# Patient Record
Sex: Male | Born: 1965 | Race: White | Hispanic: No | Marital: Married | Smoking: Former smoker
Health system: Southern US, Community
[De-identification: ages and names within clinical notes are randomized; demographics above are authoritative.]

## PROBLEM LIST (undated history)

## (undated) DIAGNOSIS — F419 Anxiety disorder, unspecified: Secondary | ICD-10-CM

## (undated) DIAGNOSIS — M519 Unspecified thoracic, thoracolumbar and lumbosacral intervertebral disc disorder: Secondary | ICD-10-CM

## (undated) DIAGNOSIS — R200 Anesthesia of skin: Secondary | ICD-10-CM

## (undated) DIAGNOSIS — T8859XA Other complications of anesthesia, initial encounter: Secondary | ICD-10-CM

## (undated) DIAGNOSIS — T783XXA Angioneurotic edema, initial encounter: Secondary | ICD-10-CM

## (undated) DIAGNOSIS — N201 Calculus of ureter: Secondary | ICD-10-CM

## (undated) DIAGNOSIS — J189 Pneumonia, unspecified organism: Secondary | ICD-10-CM

## (undated) DIAGNOSIS — F410 Panic disorder [episodic paroxysmal anxiety] without agoraphobia: Secondary | ICD-10-CM

## (undated) DIAGNOSIS — M545 Low back pain, unspecified: Secondary | ICD-10-CM

## (undated) DIAGNOSIS — R202 Paresthesia of skin: Secondary | ICD-10-CM

## (undated) DIAGNOSIS — I2510 Atherosclerotic heart disease of native coronary artery without angina pectoris: Secondary | ICD-10-CM

## (undated) DIAGNOSIS — G8929 Other chronic pain: Secondary | ICD-10-CM

## (undated) DIAGNOSIS — E785 Hyperlipidemia, unspecified: Secondary | ICD-10-CM

## (undated) DIAGNOSIS — F1190 Opioid use, unspecified, uncomplicated: Secondary | ICD-10-CM

## (undated) DIAGNOSIS — I209 Angina pectoris, unspecified: Secondary | ICD-10-CM

## (undated) DIAGNOSIS — N200 Calculus of kidney: Secondary | ICD-10-CM

## (undated) DIAGNOSIS — N400 Enlarged prostate without lower urinary tract symptoms: Secondary | ICD-10-CM

## (undated) DIAGNOSIS — I447 Left bundle-branch block, unspecified: Secondary | ICD-10-CM

## (undated) DIAGNOSIS — M1990 Unspecified osteoarthritis, unspecified site: Secondary | ICD-10-CM

## (undated) DIAGNOSIS — G4733 Obstructive sleep apnea (adult) (pediatric): Secondary | ICD-10-CM

## (undated) DIAGNOSIS — E039 Hypothyroidism, unspecified: Secondary | ICD-10-CM

## (undated) DIAGNOSIS — Z9989 Dependence on other enabling machines and devices: Secondary | ICD-10-CM

## (undated) DIAGNOSIS — I10 Essential (primary) hypertension: Secondary | ICD-10-CM

## (undated) DIAGNOSIS — K5790 Diverticulosis of intestine, part unspecified, without perforation or abscess without bleeding: Secondary | ICD-10-CM

## (undated) DIAGNOSIS — T4145XA Adverse effect of unspecified anesthetic, initial encounter: Secondary | ICD-10-CM

## (undated) HISTORY — DX: Calculus of kidney: N20.0

## (undated) HISTORY — DX: Hyperlipidemia, unspecified: E78.5

## (undated) HISTORY — DX: Angioneurotic edema, initial encounter: T78.3XXA

## (undated) HISTORY — DX: Calculus of ureter: N20.1

## (undated) HISTORY — PX: FRACTURE SURGERY: SHX138

## (undated) HISTORY — DX: Atherosclerotic heart disease of native coronary artery without angina pectoris: I25.10

## (undated) HISTORY — PX: CARDIAC CATHETERIZATION: SHX172

## (undated) HISTORY — DX: Essential (primary) hypertension: I10

## (undated) HISTORY — PX: CORONARY ANGIOPLASTY: SHX604

## (undated) HISTORY — DX: Unspecified thoracic, thoracolumbar and lumbosacral intervertebral disc disorder: M51.9

## (undated) HISTORY — DX: Diverticulosis of intestine, part unspecified, without perforation or abscess without bleeding: K57.90

## (undated) HISTORY — PX: KNEE ARTHROSCOPY: SHX127

## (undated) HISTORY — PX: TONSILLECTOMY: SUR1361

## (undated) HISTORY — PX: LITHOTRIPSY: SUR834

---
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
## 1989-02-18 HISTORY — PX: KNEE ARTHROSCOPY: SHX127

## 1999-07-08 ENCOUNTER — Encounter: Payer: Self-pay | Admitting: Urology

## 1999-07-08 ENCOUNTER — Observation Stay (HOSPITAL_COMMUNITY): Admission: RE | Admit: 1999-07-08 | Discharge: 1999-07-08 | Payer: Self-pay | Admitting: Urology

## 2000-02-01 ENCOUNTER — Encounter: Payer: Self-pay | Admitting: Urology

## 2000-02-01 ENCOUNTER — Encounter: Admission: RE | Admit: 2000-02-01 | Discharge: 2000-02-01 | Payer: Self-pay | Admitting: Urology

## 2002-10-09 ENCOUNTER — Emergency Department (HOSPITAL_COMMUNITY): Admission: EM | Admit: 2002-10-09 | Discharge: 2002-10-09 | Payer: Self-pay | Admitting: Emergency Medicine

## 2002-10-09 ENCOUNTER — Encounter: Payer: Self-pay | Admitting: Emergency Medicine

## 2002-12-04 ENCOUNTER — Encounter: Payer: Self-pay | Admitting: Emergency Medicine

## 2002-12-05 ENCOUNTER — Observation Stay (HOSPITAL_COMMUNITY): Admission: EM | Admit: 2002-12-05 | Discharge: 2002-12-05 | Payer: Self-pay | Admitting: Emergency Medicine

## 2005-02-15 ENCOUNTER — Ambulatory Visit (HOSPITAL_BASED_OUTPATIENT_CLINIC_OR_DEPARTMENT_OTHER): Admission: RE | Admit: 2005-02-15 | Discharge: 2005-02-15 | Payer: Self-pay | Admitting: Urology

## 2005-02-15 ENCOUNTER — Ambulatory Visit (HOSPITAL_COMMUNITY): Admission: RE | Admit: 2005-02-15 | Discharge: 2005-02-15 | Payer: Self-pay | Admitting: Urology

## 2005-02-17 ENCOUNTER — Ambulatory Visit (HOSPITAL_COMMUNITY): Admission: RE | Admit: 2005-02-17 | Discharge: 2005-02-17 | Payer: Self-pay | Admitting: Urology

## 2006-11-12 ENCOUNTER — Emergency Department (HOSPITAL_COMMUNITY): Admission: EM | Admit: 2006-11-12 | Discharge: 2006-11-12 | Payer: Self-pay | Admitting: Emergency Medicine

## 2007-05-05 ENCOUNTER — Encounter: Payer: Self-pay | Admitting: Cardiology

## 2008-10-18 HISTORY — PX: SYNDESMOSIS REPAIR: SHX5182

## 2008-10-31 ENCOUNTER — Emergency Department (HOSPITAL_COMMUNITY): Admission: EM | Admit: 2008-10-31 | Discharge: 2008-10-31 | Payer: Self-pay | Admitting: Emergency Medicine

## 2009-04-13 ENCOUNTER — Ambulatory Visit (HOSPITAL_COMMUNITY): Admission: RE | Admit: 2009-04-13 | Discharge: 2009-04-13 | Payer: Self-pay | Admitting: Urology

## 2009-08-14 ENCOUNTER — Ambulatory Visit: Payer: Self-pay | Admitting: Cardiovascular Disease

## 2009-08-14 ENCOUNTER — Inpatient Hospital Stay (HOSPITAL_COMMUNITY): Admission: EM | Admit: 2009-08-14 | Discharge: 2009-08-15 | Payer: Self-pay | Admitting: Emergency Medicine

## 2009-08-14 ENCOUNTER — Encounter: Payer: Self-pay | Admitting: Cardiovascular Disease

## 2009-08-14 ENCOUNTER — Encounter: Payer: Self-pay | Admitting: Cardiology

## 2009-08-15 ENCOUNTER — Encounter: Payer: Self-pay | Admitting: Cardiology

## 2009-08-15 ENCOUNTER — Encounter: Payer: Self-pay | Admitting: Cardiovascular Disease

## 2009-08-18 ENCOUNTER — Encounter (INDEPENDENT_AMBULATORY_CARE_PROVIDER_SITE_OTHER): Payer: Self-pay | Admitting: *Deleted

## 2009-09-07 ENCOUNTER — Encounter: Payer: Self-pay | Admitting: Cardiology

## 2009-09-08 ENCOUNTER — Ambulatory Visit: Payer: Self-pay | Admitting: Cardiology

## 2009-09-08 DIAGNOSIS — E785 Hyperlipidemia, unspecified: Secondary | ICD-10-CM | POA: Insufficient documentation

## 2009-09-08 DIAGNOSIS — E782 Mixed hyperlipidemia: Secondary | ICD-10-CM

## 2009-09-08 DIAGNOSIS — I25119 Atherosclerotic heart disease of native coronary artery with unspecified angina pectoris: Secondary | ICD-10-CM | POA: Insufficient documentation

## 2009-09-08 DIAGNOSIS — I2510 Atherosclerotic heart disease of native coronary artery without angina pectoris: Secondary | ICD-10-CM

## 2009-09-29 ENCOUNTER — Encounter: Payer: Self-pay | Admitting: Cardiology

## 2009-10-26 ENCOUNTER — Encounter (INDEPENDENT_AMBULATORY_CARE_PROVIDER_SITE_OTHER): Payer: Self-pay | Admitting: *Deleted

## 2009-10-30 ENCOUNTER — Encounter: Payer: Self-pay | Admitting: Cardiology

## 2009-11-03 ENCOUNTER — Encounter (INDEPENDENT_AMBULATORY_CARE_PROVIDER_SITE_OTHER): Payer: Self-pay | Admitting: *Deleted

## 2009-12-03 ENCOUNTER — Ambulatory Visit: Payer: Self-pay | Admitting: Cardiology

## 2010-03-30 ENCOUNTER — Encounter: Payer: Self-pay | Admitting: Cardiology

## 2010-04-06 ENCOUNTER — Encounter: Payer: Self-pay | Admitting: Cardiology

## 2010-05-05 ENCOUNTER — Encounter: Payer: Self-pay | Admitting: Cardiology

## 2010-05-31 ENCOUNTER — Encounter: Payer: Self-pay | Admitting: Cardiology

## 2010-06-01 ENCOUNTER — Ambulatory Visit: Payer: Self-pay | Admitting: Cardiology

## 2010-06-01 ENCOUNTER — Encounter: Payer: Self-pay | Admitting: Cardiology

## 2010-07-13 ENCOUNTER — Telehealth: Payer: Self-pay | Admitting: Cardiology

## 2010-07-22 ENCOUNTER — Telehealth (INDEPENDENT_AMBULATORY_CARE_PROVIDER_SITE_OTHER): Payer: Self-pay | Admitting: *Deleted

## 2010-07-22 NOTE — Letter (Signed)
Summary: Discharge Summary/ Joseph [name]  Discharge Summary/ Joseph [name]   Imported By: Dorise Hiss 09/09/2009 08:38:25  _____________________________________________________________________  External Attachment:    Type:   Image     Comment:   External Document

## 2010-07-22 NOTE — Letter (Signed)
Summary: Engineer, materials at [facility]  [address]   [place], [place] [address]   Phone: [phone]  Fax: [phone]        Nov 03, 2009 MRN: [id number]    [facility] [address] [place], [place]  [address]    Dear Mr. Huizar,  Your test ordered by Selena Batten has been reviewed by your physician (or physician assistant) and was found to be normal or stable. Your physician (or physician assistant) felt no changes were needed at this time.  ____ Echocardiogram  ____ Cardiac Stress Test  __X__ Lab Work-Cholesterol numbers are looking better and liver function is okay. Continue same medications.  ____ Peripheral vascular study of arms, legs or neck  ____ CT scan or X-ray  ____ Lung or Breathing test  ____ Other:   Thank you.   Cyril Loosen, RN, BSN    Duane Boston, M.D., F.A.C.C. Thressa Sheller, M.D., F.A.C.C. Oneal Grout, M.D., F.A.C.C. Cheree Ditto, M.D., F.A.C.C. Daiva Nakayama, M.D., F.A.C.C. Kenney Houseman, M.D., F.A.C.C. Jeanne Ivan, PA-C

## 2010-07-22 NOTE — Miscellaneous (Signed)
Summary: Orders Update  Clinical Lists Changes  Orders: Added new Test order of T-Lipid Profile ([id number]) - Signed Added new Test order of T-Hepatic Function ([id number]) - Signed 

## 2010-07-22 NOTE — Letter (Signed)
Summary: External Correspondence/ [facility] OFFICE NOTE  External Correspondence/ [facility] OFFICE NOTE   Imported By: Dorise Hiss 04/12/2010 13:55:33  _____________________________________________________________________  External Attachment:    Type:   Image     Comment:   External Document

## 2010-07-22 NOTE — Letter (Signed)
Summary: Generic Engineer, agricultural at [facility] [address]   [place], [place] [address]   Phone: [phone]  Fax: [phone]        Oct 26, 2009 MRN: [id number]    [facility] [address] [place], [place]  [address]    Dear Mr. Badal,   This is a reminder that you are due for lab work around May 16th to check your cholesterol and liver function labs. Please, take the enclosed order to the [facility] to have this lab work drawn. Do not eat or drink after midnight.      Sincerely,  Cyril Loosen, RN, BSN  This letter has been electronically signed by your physician.

## 2010-07-22 NOTE — Letter (Signed)
Summary: External Correspondence/ OFFICE NOTE [facility]  External Correspondence/ OFFICE NOTE [facility]   Imported By: Dorise Hiss 10/12/2009 10:13:07  _____________________________________________________________________  External Attachment:    Type:   Image     Comment:   External Document

## 2010-07-22 NOTE — Assessment & Plan Note (Signed)
Summary: 6 MO FU DEC REMINDER   Visit Type:  Follow-up Primary Ipek Westra:  Dr. Donzetta Sprung   History of Present Illness: 45 year old male presents for followup. He was seen back in June. He reports doing very well, no angina or breathlessness. Exercises, including racquetball, without symptoms. He reports compliance with his medications.  Followup labs from 12 December showed AST 24, ALT 29, cholesterol 163, triglycerides 135, HDL 35, LDL 101. We discussed these today. He has been on Zocor 10 mg daily.  He is due for refills, and will need 90 day supplies for mail order as of January. Also needs refill for nitroglycerin, has not required any.  Clinical Review Panels:  Cardiac Imaging Cardiac Cath Findings  FINDINGS:   1. Hemodynamics:  Aorta 125/89, LV 143/17.   2. Left ventriculography:  EF was estimated to be 60-65%.  There were       no regional wall motion abnormalities in the RAO projection.   3. Right coronary artery:  The right coronary artery had diffuse       approximately 30% stenosis throughout the proximal portion of the       vessel.  It was a dominant vessel.   4. Left main:  The left main had mild luminal regularities.   5. Left circumflex system:  The left circumflex itself was a large       vessel.  There were first and second obtuse marginals and a PLOM       with luminal irregularities only.   6. LAD system:  There was a 50% proximal LAD stenosis before the first       diagonal.  There was then a small first diagonal and a moderate-       sized second diagonal.  Following the second diagonal, there was a       90% stenosis around the junction of the proximal and mid LAD.  The       mid LAD had luminal irregularities.  (08/14/2009)    Preventive Screening-Counseling & Management  Alcohol-Tobacco     Smoking Status: quit     Year Quit: 04/2003  Current Medications (verified): 1)  Synthroid 88 Mcg Tabs (Levothyroxine Sodium) .... Take 1 Tablet By Mouth Once  A Day 2)  Plavix 75 Mg Tabs (Clopidogrel Bisulfate) .... Take 1 Tablet By Mouth Once A Day 3)  Aspir-Trin 325 Mg Tbec (Aspirin) .... Take 1 Tablet By Mouth Once A Day 4)  Toprol Xl 25 Mg Xr24h-Tab (Metoprolol Succinate) .... Take 1 Tablet By Mouth Once A Day 5)  Vicodin 5-500 Mg Tabs (Hydrocodone-Acetaminophen) .... Take 1 Tablet By Mouth Once A Day As Needed 6)  Zocor 20 Mg Tabs (Simvastatin) .... Take 1 Tab By Mouth At Bedtime 7)  Nitrostat 0.4 Mg Subl (Nitroglycerin) .... Dissolve Under Tonguefor Chest Pain Every 5 Minutes Not To Exceed 3 Doses.  Allergies (verified): 1)  ! Pcn 2)  ! * Tetanus  Comments:  Nurse/Medical Assistant: The patient is currently on medications but does not know the name or dosage at this time. Instructed to contact our office with details. Will update medication list at that time.  Past History:  Past Medical History: Last updated: 09/05/2009 CAD - DES LAD 2/11, LVEF 65% Hyperlipidemia Hypothyroidism OSA Diverticulosis Lumbar disc disease Right ureteral stone  Social History: Last updated: 09/05/2009 Full Time Married  Tobacco Use - No Alcohol Use - no  Review of Systems  The patient denies anorexia, fever, weight loss, chest pain,  syncope, dyspnea on exertion, peripheral edema, melena, and hematochezia.         Otherwise reviewed and negative.  Vital Signs:  Patient profile:   45 year old male Height:      72 inches Weight:      260 pounds Pulse rate:   74 / minute BP sitting:   130 / 83  (left arm) Cuff size:   large  Vitals Entered By: Carlye Grippe (June 01, 2010 3:59 PM)  Physical Exam  Additional Exam:  Overweight male in no acute distress. HEENT: Conjunctiva and lids normal, oropharynx clear. Neck: Supple, no elevated chart her venous pressure or bruits. Lungs: Clear auscultation, nonlabored. Cardiac: Regular rate and rhythm, no S3. Abdomen: Soft, nontender, no bruits. Extremities: No pitting edema, right radial  cath site stable. Normal radial pulses. Skin: Warm and dry. Musculoskeletal: No kyphosis. Neuropsychiatric: Alert and oriented x3, affect appropriate.   EKG  Procedure date:  06/01/2010  Findings:      Sinus rhythm at 72 beats per minute.  Impression & Recommendations:  Problem # 1:  CORONARY ATHEROSCLEROSIS NATIVE CORONARY ARTERY (ICD-414.01)  Symptomatically stable on medical therapy. Continue dual antiplatelet therapy, at least through February 2012. Plan followup in 6 months.  His updated medication list for this problem includes:    Plavix 75 Mg Tabs (Clopidogrel bisulfate) .Marland Kitchen... Take 1 tablet by mouth once a day    Aspir-trin 325 Mg Tbec (Aspirin) .Marland Kitchen... Take 1 tablet by mouth once a day    Toprol Xl 25 Mg Xr24h-tab (Metoprolol succinate) .Marland Kitchen... Take 1 tablet by mouth once a day    Nitrostat 0.4 Mg Subl (Nitroglycerin) .Marland Kitchen... Dissolve under tonguefor chest pain every 5 minutes not to exceed 3 doses.  Orders: EKG w/ Interpretation (93000)  Problem # 2:  MIXED HYPERLIPIDEMIA (ICD-272.2)  Plan to increase Zocor to 20 mg p.o. q.h.s. Followup fasting lipid profile and liver function tests for his next visit.  His updated medication list for this problem includes:    Zocor 20 Mg Tabs (Simvastatin) .Marland Kitchen... Take 1 tab by mouth at bedtime  Patient Instructions: 1)  Increase Zocor to 20mg  at bedtime - may take two of the 10mg  till get mail order.  PATIENT WILL CALL BACK WITH NAME OF MAIL ORDER AFTER HE CHECKS WITH HIS INSURANCE FOR NEW 90 DAY RX. 2)  Labs:  FLP/LFT prior to next visit in 6 months 3)  Follow up in  6 months Prescriptions: NITROSTAT 0.4 MG SUBL (NITROGLYCERIN) dissolve under tonguefor chest pain every 5 minutes not to exceed 3 doses.  #25 x 2   Entered by:   Hoover Brunette, LPN   Authorized by:   Loreli Slot, MD, [facility]   Signed by:   Hoover Brunette, LPN on 16/03/9603   Method used:   Electronically to        [facility] [address] (retail)        [address]       [place], [place]  [address]       Ph: [phone] or [phone]       Fax: [phone]   RxID:   [id number]

## 2010-07-22 NOTE — Assessment & Plan Note (Signed)
Summary: 3 MO FU PER JUNE REMINDER-SRS   Visit Type:  Follow-up Primary Provider:  Dr. Donzetta Sprung   History of Present Illness: 45 year old male presents for followup. He reports doing very well without any significant angina. He is now exercising on a daily basis, including running on the treadmill and also racquetball. He reports better energy and stamina.  Labs from 13 May showed AST 27, ALT 36, cholesterol 142, HDL 36, LDL 92 (was 134), triglycerides 68. He is tolerating low-dose Zocor.  He has lost 8 pounds since his last visit with a combination of diet and exercise.  He does indicate some cramping in his calves, usually after he exercises or sometimes in the morning. Not clearly claudication, although we did discuss this.  Preventive Screening-Counseling & Management  Alcohol-Tobacco     Smoking Status: quit     Year Quit: 2004  Current Medications (verified): 1)  Synthroid 88 Mcg Tabs (Levothyroxine Sodium) .... Take 1 Tablet By Mouth Once A Day 2)  Plavix 75 Mg Tabs (Clopidogrel Bisulfate) .... Take 1 Tablet By Mouth Once A Day 3)  Aspir-Trin 325 Mg Tbec (Aspirin) .... Take 1 Tablet By Mouth Once A Day 4)  Toprol Xl 25 Mg Xr24h-Tab (Metoprolol Succinate) .... Take 1 Tablet By Mouth Once A Day 5)  Vicodin 5-500 Mg Tabs (Hydrocodone-Acetaminophen) .... Take 1 Tablet By Mouth Once A Day As Needed 6)  Zocor 10 Mg Tabs (Simvastatin) .... Take 1 Tablet By Mouth At Bedtime 7)  Nitrostat 0.4 Mg Subl (Nitroglycerin) .... Dissolve Under Tonguefor Chest Pain Every 5 Minutes Not To Exceed 3 Doses.  Allergies (verified): 1)  ! Pcn 2)  ! * Tetanus  Comments:  Nurse/Medical Assistant: The patient is currently on medications but does not know the name or dosage at this time. Instructed to contact our office with details. Will update medication list at that time.  Past History:  Past Medical History: Last updated: 09/05/2009 CAD - DES LAD 2/11, LVEF  65% Hyperlipidemia Hypothyroidism OSA Diverticulosis Lumbar disc disease Right ureteral stone  Social History: Last updated: 09/05/2009 Full Time Married  Tobacco Use - No Alcohol Use - no  Clinical Review Panels:  Cardiac Imaging Cardiac Cath Findings  FINDINGS:   1. Hemodynamics:  Aorta 125/89, LV 143/17.   2. Left ventriculography:  EF was estimated to be 60-65%.  There were       no regional wall motion abnormalities in the RAO projection.   3. Right coronary artery:  The right coronary artery had diffuse       approximately 30% stenosis throughout the proximal portion of the       vessel.  It was a dominant vessel.   4. Left main:  The left main had mild luminal regularities.   5. Left circumflex system:  The left circumflex itself was a large       vessel.  There were first and second obtuse marginals and a PLOM       with luminal irregularities only.   6. LAD system:  There was a 50% proximal LAD stenosis before the first       diagonal.  There was then a small first diagonal and a moderate-       sized second diagonal.  Following the second diagonal, there was a       90% stenosis around the junction of the proximal and mid LAD.  The       mid LAD had luminal irregularities.  (  08/14/2009)    Review of Systems  The patient denies anorexia, fever, chest pain, syncope, dyspnea on exertion, peripheral edema, abdominal pain, melena, and hematochezia.         Otherwise reviewed and negative except as outlined.  Vital Signs:  Patient profile:   45 year old male Height:      72 inches Weight:      255 pounds Pulse rate:   65 / minute BP sitting:   115 / 72  (left arm) Cuff size:   large  Vitals Entered By: Carlye Grippe (December 03, 2009 2:26 PM)  Physical Exam  Additional Exam:  Overweight male in no acute distress. HEENT: Conjunctiva and lids normal, oropharynx clear. Neck: Supple, no elevated chart her venous pressure or bruits. Lungs: Clear auscultation,  nonlabored. Cardiac: Regular rate and rhythm, no S3. Abdomen: Soft, nontender, no bruits. Extremities: No pitting edema, right radial cath site stable. Normal radial pulses. Skin: Warm and dry. Musculoskeletal: No kyphosis. Neuropsychiatric: Alert and oriented x3, affect appropriate.   Impression & Recommendations:  Problem # 1:  CORONARY ATHEROSCLEROSIS NATIVE CORONARY ARTERY (ICD-414.01)  Clinically stable without significant angina. Patient has made good progress in terms of diet and exercise. Plan to continue medical therapy, and I will see him back in 6 months.  His updated medication list for this problem includes:    Plavix 75 Mg Tabs (Clopidogrel bisulfate) .Marland Kitchen... Take 1 tablet by mouth once a day    Aspir-trin 325 Mg Tbec (Aspirin) .Marland Kitchen... Take 1 tablet by mouth once a day    Toprol Xl 25 Mg Xr24h-tab (Metoprolol succinate) .Marland Kitchen... Take 1 tablet by mouth once a day    Nitrostat 0.4 Mg Subl (Nitroglycerin) .Marland Kitchen... Dissolve under tonguefor chest pain every 5 minutes not to exceed 3 doses.  Problem # 2:  MIXED HYPERLIPIDEMIA (ICD-272.2)  LDL trending down on low-dose Zocor. Plan followup lipid profile and liver function tests in 6 months.  His updated medication list for this problem includes:    Zocor 10 Mg Tabs (Simvastatin) .Marland Kitchen... Take 1 tablet by mouth at bedtime  Patient Instructions: 1)  Your physician wants you to follow-up in: 6 months. You will receive a reminder letter in the mail one-two months in advance. If you don't receive a letter, please call our office to schedule the follow-up appointment. 2)  Your physician recommends that you go to the [facility] for a FASTING lipid profile and liver function labs: DO LABS IN 6 MONTHS BEFORE YOUR NEXT OFFICE VISIT.

## 2010-07-22 NOTE — Assessment & Plan Note (Signed)
Summary: eph-unstable angina cone   Visit Type:  Follow-up Primary Provider:  Dr. Donzetta Sprung   History of Present Illness: 45 year old male presents for a post-hospitalization followup visit. He recently presented to [facility] in late February with unstable angina, undergoing a cardiac catheterization that revealed significant obstructive left anterior descending disease, ultimately treated with drug-eluting stent placement by Dr. Clifton Vir. He was enrolled in the [facility] trial.  He is here with his wife for followup. He reports doing very well, focusing very intently on modifying his diet, with a good weight loss, and increase in his regular exercise regimen. He reports compliance with his medications. He is having no difficulty at his right radial catheter site. He is returned to work as before.  We reviewed his cholesterol numbers today, with LDL of 134, and discussed initiating low-dose simvastatin.  Preventive Screening-Counseling & Management  Alcohol-Tobacco     Smoking Status: quit     Year Quit: 2004  Current Medications (verified): 1)  Synthroid 88 Mcg Tabs (Levothyroxine Sodium) .... Take 1 Tablet By Mouth Once A Day 2)  Plavix 75 Mg Tabs (Clopidogrel Bisulfate) .... Take 1 Tablet By Mouth Once A Day 3)  Aspir-Trin 325 Mg Tbec (Aspirin) .... Take 1 Tablet By Mouth Once A Day 4)  Toprol Xl 25 Mg Xr24h-Tab (Metoprolol Succinate) .... Take 1 Tablet By Mouth Once A Day 5)  Vicodin 5-500 Mg Tabs (Hydrocodone-Acetaminophen) .... Take 1 Tablet By Mouth Once A Day As Needed 6)  Zocor 10 Mg Tabs (Simvastatin) .... Take 1 Tablet By Mouth At Bedtime  Comments:  Nurse/Medical Assistant: The patient's medications and allergies were reviewed with the patient and were updated in the Medication and Allergy Lists. Verbally gave names.  Past History:  Past Medical History: Last updated: 09/05/2009 CAD - DES LAD 2/11, LVEF  65% Hyperlipidemia Hypothyroidism OSA Diverticulosis Lumbar disc disease Right ureteral stone  Social History: Last updated: 09/05/2009 Full Time Married  Tobacco Use - No Alcohol Use - no  Clinical Review Panels:  Cardiac Imaging Cardiac Cath Findings  FINDINGS:   1. Hemodynamics:  Aorta 125/89, LV 143/17.   2. Left ventriculography:  EF was estimated to be 60-65%.  There were       no regional wall motion abnormalities in the RAO projection.   3. Right coronary artery:  The right coronary artery had diffuse       approximately 30% stenosis throughout the proximal portion of the       vessel.  It was a dominant vessel.   4. Left main:  The left main had mild luminal regularities.   5. Left circumflex system:  The left circumflex itself was a large       vessel.  There were first and second obtuse marginals and a PLOM       with luminal irregularities only.   6. LAD system:  There was a 50% proximal LAD stenosis before the first       diagonal.  There was then a small first diagonal and a moderate-       sized second diagonal.  Following the second diagonal, there was a       90% stenosis around the junction of the proximal and mid LAD.  The       mid LAD had luminal irregularities.  (08/14/2009)    Social History: Smoking Status:  quit  Review of Systems  The patient denies anorexia, fever, chest pain, syncope, dyspnea on exertion, peripheral edema,  melena, and hematochezia.         He has had some indigestion, otherwise reviewed and negative.  Vital Signs:  Patient profile:   45 year old male Height:      72 inches Weight:      263 pounds BMI:     35.80 Pulse rate:   72 / minute BP sitting:   111 / 74  (left arm) Cuff size:   large  Vitals Entered By: Carlye Grippe (September 08, 2009 10:39 AM)  Nutrition Counseling: Patient's BMI is greater than 25 and therefore counseled on weight management options.   Physical Exam  Additional Exam:  Overweight male in  no acute distress. HEENT: Conjunctiva and lids normal, oropharynx clear. Neck: Supple, no elevated chart her venous pressure or bruits. Lungs: Clear auscultation, nonlabored. Cardiac: Regular rate and rhythm, no S3. Abdomen: Soft, nontender, no bruits. Extremities: No pitting edema, right radial cath site stable. Normal radial pulses. Skin: Warm and dry. Musculoskeletal: No kyphosis. Neuropsychiatric: Alert and oriented x3, affect appropriate.   Impression & Recommendations:  Problem # 1:  CORONARY ATHEROSCLEROSIS NATIVE CORONARY ARTERY (ICD-414.01)  Patient doing well on medical therapy following recent intervention in the setting of unstable angina, with drug-eluting stent placement to left anterior descending as noted. He is exercising regularly, and has achieved weight loss through modifying his diet. I plan to see him back over the next 3 months.  His updated medication list for this problem includes:    Plavix 75 Mg Tabs (Clopidogrel bisulfate) .Marland Kitchen... Take 1 tablet by mouth once a day    Aspir-trin 325 Mg Tbec (Aspirin) .Marland Kitchen... Take 1 tablet by mouth once a day    Toprol Xl 25 Mg Xr24h-tab (Metoprolol succinate) .Marland Kitchen... Take 1 tablet by mouth once a day  Problem # 2:  MIXED HYPERLIPIDEMIA (ICD-272.2)  We will initiate simvastatin 10 mg p.o. q.h.s. and plan a followup fasting lipid profile and liver function tests in 8 weeks. Goal LDL would be closer to 70.  His updated medication list for this problem includes:    Zocor 10 Mg Tabs (Simvastatin) .Marland Kitchen... Take 1 tablet by mouth at bedtime  Future Orders: T-Lipid Profile ([id number]) ... 11/02/2009 T-Hepatic Function [id number]) ... 11/02/2009  Patient Instructions: 1)  Your physician wants you to follow-up in: 3 months. You will receive a reminder letter in the mail one-two months in advance. If you don't receive a letter, please call our office to schedule the follow-up appointment. 2)  Your physician recommends that you go to the  [facility] for a FASTING lipid profile and liver function labs: DO LABS IN 8 WEEKS (AROUND MAY 16TH). Do not eat or drink after midnight.  3)  Start Simvastatin (Zocor) 10mg  by mouth at bedtime. Prescriptions: ZOCOR 10 MG TABS (SIMVASTATIN) Take 1 tablet by mouth at bedtime  #30 x 6   Entered by:   Cyril Loosen, RN, BSN   Authorized by:   Loreli Slot, MD, [facility]   Signed by:   Cyril Loosen, RN, BSN on 09/08/2009   Method used:   Electronically to        [facility] [address] (retail)       [address]       [place], [place]  [address]       Ph: [phone] or [phone]       Fax: [phone]   RxID:   [id number]   Handout requested.

## 2010-07-22 NOTE — Miscellaneous (Signed)
Summary: FLP/LFT  Clinical Lists Changes  Orders: Added new Test order of T-Hepatic Function ([id number]) - Signed Added new Test order of T-Lipid Profile ([id number]) - Signed 

## 2010-07-22 NOTE — Letter (Signed)
Summary: Appointment- Rescheduled  [facility] [address]   [place], [place] [address]   Phone: [phone]  Fax: [phone]     Oct 30, 2009 MRN: [id number]     [facility] [address] [place], [place]  [address]     Dear Mr. Orlov,   Due to a change in our office schedule, your appointment on    June 16,2011 at  3:40pm must be changed to June 16, 2011at 2:40.     We look forward to participating in your health care needs.      Sincerely,  Glass blower/designer

## 2010-07-22 NOTE — Progress Notes (Signed)
Summary: Chest Pain  Phone Note Call from Patient Call back at [phone]   Summary of Call: Pt states he's had some chest pain. It is not hurting now but was hurting this am.  It states it feels like indigestion. Pt states he has not used any NTG. He states this started about 1 week ago. He states pain is usualy not related to a physical activity. He has been working out at gym without any problems. He states he has been eating really bad.   Pt will try NTG if pain returns. He is aware to go to ER for worsening or concerning symptoms. He is aware a note will be sent to Dr. Diona Browner for further review. Initial call taken by: Cyril Loosen, RN, BSN,  July 13, 2010 5:00 PM  Follow-up for Phone Call        Reassuring to hear that he is not having any exertional symptoms, particularly during workouts at the gym. As noted, this could be indigestion. Trial of antacid such as Pepcid or Zantac could be considered. If on the other hand, he notices responsiveness to nitroglycerin or the symptoms progress, we should see him and review this further. Follow-up by: Loreli Slot, MD, [facility],  July 14, 2010 8:14 AM     Appended Document: Chest Pain Pt notified and verbalized understanding.

## 2010-07-28 NOTE — Progress Notes (Signed)
Summary: CLARIFICATION ON TOPROL XL DOSE  Phone Note Call from Patient Call back at Home Phone [phone] Call back at [phone]   Caller: Spouse Call For: nurse Summary of Call: [facility] for toprol xl 25mg  1/2 daily. wife called to confirm dose saying patient has been on 1/2 since hospital d/c last February 2011. nurse informed wife that initial visit from hospital when meds were reviewed,patient verbally stated he was on the 25mg  and didn't specify to nurse he took 1/2. nurse informed wife that new dose would be sent to pharmacy and patient should bring bottles to each visit. Initial call taken by: Carlye Grippe,  July 22, 2010 4:53 PM    New/Updated Medications: TOPROL XL 25 MG XR24H-TAB (METOPROLOL SUCCINATE) Take 1/2 tablet by mouth once a day Prescriptions: TOPROL XL 25 MG XR24H-TAB (METOPROLOL SUCCINATE) Take 1/2 tablet by mouth once a day  #15 x 6   Entered by:   Carlye Grippe   Authorized by:   Loreli Slot, MD, [facility]   Signed by:   Carlye Grippe on 07/22/2010   Method used:   [facility]        [facility]  [address] [address] [id number]       [address]       [place], [place]  [address]       Ph: [phone] or [phone]       Fax: [phone]   RxID:   [id number]

## 2010-09-08 LAB — BASIC METABOLIC PANEL
CO2: 28 mEq/L (ref 19–32)
Calcium: 8.6 mg/dL (ref 8.4–10.5)
Creatinine, Ser: 0.82 mg/dL (ref 0.4–1.5)
GFR calc Af Amer: >60 mL/min (ref >60)
GFR calc non Af Amer: >60 mL/min (ref >60)

## 2010-09-08 LAB — CBC
HCT: 44.5 % (ref 39.0–52.0)
Hemoglobin: 15.3 g/dL (ref 13.0–17.0)
MCHC: 34 g/dL (ref 30.0–36.0)
MCV: 93.8 fL (ref 78.0–100.0)
MCV: 94.2 fL (ref 78.0–100.0)
Platelets: 205 10*3/uL (ref 150–400)
RBC: 4.41 MIL/uL (ref 4.22–5.81)
RBC: 4.75 MIL/uL (ref 4.22–5.81)
WBC: 5.7 10*3/uL (ref 4.0–10.5)

## 2010-09-08 LAB — COMPREHENSIVE METABOLIC PANEL
ALT: 65 U/L — ABNORMAL HIGH (ref 0–53)
Albumin: 4.3 g/dL (ref 3.5–5.2)
Calcium: 8.9 mg/dL (ref 8.4–10.5)
Chloride: 108 mEq/L (ref 96–112)
GFR calc Af Amer: >60 mL/min (ref >60)
GFR calc non Af Amer: >60 mL/min (ref >60)
Glucose, Bld: 90 mg/dL (ref 70–99)
Potassium: 3.9 mEq/L (ref 3.5–5.1)
Sodium: 140 mEq/L (ref 135–145)
Total Bilirubin: 0.9 mg/dL (ref 0.3–1.2)

## 2010-09-08 LAB — POCT CARDIAC MARKERS
CKMB, poc: 1.1 ng/mL (ref 1.0–8.0)
CKMB, poc: <1 ng/mL — ABNORMAL LOW (ref 1.0–8.0)
Myoglobin, poc: 66.8 ng/mL (ref 12–200)
Troponin i, poc: <0.05 ng/mL (ref 0.00–0.09)

## 2010-09-08 LAB — DIFFERENTIAL
Basophils Absolute: 0 10*3/uL (ref 0.0–0.1)
Eosinophils Absolute: 0 10*3/uL (ref 0.0–0.7)
Eosinophils Relative: 1 % (ref 0–5)
Lymphocytes Relative: 30 % (ref 12–46)
Lymphs Abs: 1.7 10*3/uL (ref 0.7–4.0)
Monocytes Relative: 8 % (ref 3–12)
Neutrophils Relative %: 60 % (ref 43–77)

## 2010-09-08 LAB — POCT I-STAT, CHEM 8
Calcium, Ion: 1.09 mmol/L — ABNORMAL LOW (ref 1.12–1.32)
TCO2: 27 mmol/L (ref 0–100)

## 2010-09-08 LAB — CK TOTAL AND CKMB (NOT AT ARMC): Relative Index: 1.3 (ref 0.0–2.5)

## 2010-09-08 LAB — APTT: aPTT: 28 seconds (ref 24–37)

## 2010-09-08 LAB — D-DIMER, QUANTITATIVE: D-Dimer, Quant: <0.22 ug/mL-FEU (ref 0.00–0.48)

## 2010-09-08 LAB — HEMOGLOBIN A1C
Hgb A1c MFr Bld: 6 % (ref 4.6–6.1)
Mean Plasma Glucose: 126 mg/dL

## 2010-09-08 LAB — CARDIAC PANEL(CRET KIN+CKTOT+MB+TROPI)
Total CK: 130 U/L (ref 7–232)
Troponin I: 0.06 ng/mL (ref 0.00–0.06)

## 2010-09-08 LAB — TROPONIN I: Troponin I: 0.01 ng/mL (ref 0.00–0.06)

## 2010-09-23 LAB — CBC
MCHC: 33.8 g/dL (ref 30.0–36.0)
RBC: 4.52 MIL/uL (ref 4.22–5.81)
RDW: 13.1 % (ref 11.5–15.5)

## 2010-09-23 LAB — BASIC METABOLIC PANEL
CO2: 26 mEq/L (ref 19–32)
Calcium: 9.2 mg/dL (ref 8.4–10.5)
Creatinine, Ser: 0.92 mg/dL (ref 0.4–1.5)
GFR calc Af Amer: >60 mL/min (ref >60)

## 2010-10-28 ENCOUNTER — Other Ambulatory Visit: Payer: Self-pay | Admitting: *Deleted

## 2010-10-28 MED ORDER — METOPROLOL SUCCINATE 12.5 MG HALF TABLET: 12.5000 mg | ORAL_TABLET | Freq: Every day | ORAL | Status: DC
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
## 2010-11-02 NOTE — Consult Note (Signed)
NAME:  Joseph [name], Joseph [name] NO.:  [id number]   MEDICAL RECORD NO.:  [id number]          PATIENT TYPE:  EMS   LOCATION:  MAJO                         FACILITY:  [facility]   PHYSICIAN:  Leonides Grills, M.D.     DATE OF BIRTH:  1966-01-09   DATE OF CONSULTATION:  10/31/2008  DATE OF DISCHARGE:                                 CONSULTATION   PREOPERATIVE DIAGNOSES:  1. Dislocated right trimalleolar ankle fracture.  2. Right distal tib-fib syndesmosis.   POSTOPERATIVE DIAGNOSES:  1. Dislocated right trimalleolar ankle fracture.  2. Right distal tib-fib syndesmosis.   PROCEDURE:  Closed reduction under conscious sedation, right  trimalleolar ankle fracture and distal tib-fib syndesmotic rupture.   ANESTHESIA:  Conscious sedation.   SURGEON:  Leonides Grills, MD.   ASSISTANT:  Richardean Canal, PA-C   INDICATIONS:  This is a 45 year old male who was pushing lawn mower up  into a trailer, fell, and his leg caught.  He twisted his leg and  sustained the above injury.  He was consented to the above procedure.  All risks including skin swelling and possible fracture blister and skin  breakdown, arthritis, stiffness, and the likelihood of open reduction  and internal fixation in the near future once soft tissue swelling has  subsided were all explained.  Questions were encouraged and answered. He  was also explained that he could develop an osteochondral lesion, scar  tissue, and bone fragments within the joint as well and then it may not  be a perfectly anatomic reduction.   PROCEDURE IN DETAIL:  Under conscious sedation with morphine and Versed  with the patient monitored with both pulse ox and EKG with respiratory  and the ER physician present, we performed a controlled conscious  sedation.  Gentle reduction was then performed to the right ankle and  this was nearly anatomically reduced.  Well-molded posterior U splint  was applied.  Postreduction films were ordered.  The patient  was stable  postprocedure.  Respiratory was also present due to the fact the patient  has sleep apnea.  The patient was stable postprocedure.      Leonides Grills, M.D.  Electronically Signed     PB/MEDQ  D:  10/31/2008  T:  11/01/2008  Job:  [id number]

## 2010-11-05 NOTE — Op Note (Signed)
NAME:  Joseph [name], Joseph [name] NO.:  [id number]   MEDICAL RECORD NO.:  [id number]          PATIENT TYPE:  AMB   LOCATION:  NESC                         FACILITY:  [facility]   PHYSICIAN:  Boston Service, M.D.DATE OF BIRTH:  [birth year]   DATE OF PROCEDURE:  02/15/2005  DATE OF DISCHARGE:                                 OPERATIVE REPORT   PREOPERATIVE DIAGNOSES:  A 6 mm right ureteral stone, 4 and 4 mm right renal  stones.   POSTOPERATIVE DIAGNOSES:  A 6 mm right ureteral stone, 4 and 4 mm right  renal stones.   PROCEDURE:  Cystoscopy, double-J stent placement.   SURGEON:  Boston Service, M.D.   ASSISTANCE:  None.   ANESTHESIA:  General.   SPECIMENS:  None.   ESTIMATED BLOOD LOSS:  Minimal.   DESCRIPTION OF PROCEDURE:  [facility] sheet from February 12, 2005 as well as CT scan from [facility] February 12, 2005. A 4 mm, 3 mm, and 1 mm calculi within  the right kidney,  right nephrolithiasis and hydronephrosis secondary to a 6  mm right mid ureteral stone.   The patient was prepped and draped in the dorsal lithotomy position after  institution of an adequate level of general anesthesia. A well lubricated 21-  French panendoscope was gently inserted at the urethral meatus, normal  urethra and sphincter. Normal trigone and orifices.   A retrograde blocking catheter was selected and inserted at the left  ureteral orifice. Films showed normal course and caliber of the ureter,  pelvis and calyces with prompt drainage at 3-5 minutes. A right retrograde  was then performed and showed a tightly impacted 6 mm calculus within the  right mid ureter with proximal hydronephrosis. Attempts were made to pass a  ureteral catheter beneath the calculus with gentle injection of a  combination of water and contrast. The stone did not appear to moved from  its position within the mid ureter.  A floppy tip guidewire  was then easily  inserted alongside the stone, coiled nicely in the upper pole calyces the  short 6-French ureteroscope was then inserted alongside the guidewire to a  position about 1 cm below the stone which was the limit of the short 6-  [place] scope. A floppy tip guidewire was passed through the scope. The stone  could not be displaced from the mid ureter. A decision was made to remove  the ureteroscope. The indwelling floppy-tip guidewire was left in place.  Fluoroscopy confirmed position of the distal end of the guidewire within the  upper pole calyces. A 6-French 26 cm double-J stent was then selected,  passed over the indwelling guidewire with excellent pigtail formation on  guidewire  removal. There was prompt efflux of pink tinged urine through the  fenestrations of the double-J stent. The cystoscope was removed once the  bladder had been drained. The patient was given a B&O suppository and  returned to recovery in satisfactory condition.           ______________________________  Teodoro Kil  Wanda Plump, M.D.     RH/MEDQ  D:  02/15/2005  T:  02/15/2005  Job:  [id number]   cc:   Donzetta Sprung  [address]  [place]  [place] [address]  Fax: [phone]
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
## 2010-11-26 ENCOUNTER — Encounter: Payer: Self-pay | Admitting: Cardiology

## 2010-11-29 ENCOUNTER — Encounter: Payer: Self-pay | Admitting: Cardiology

## 2010-11-29 ENCOUNTER — Ambulatory Visit (INDEPENDENT_AMBULATORY_CARE_PROVIDER_SITE_OTHER): Payer: BC Managed Care – PPO | Admitting: Cardiology

## 2010-11-29 VITALS — BP 131/87 | HR 81 | Ht 70.0 in | Wt 270.0 lb

## 2010-11-29 DIAGNOSIS — I2510 Atherosclerotic heart disease of native coronary artery without angina pectoris: Secondary | ICD-10-CM

## 2010-11-29 DIAGNOSIS — I10 Essential (primary) hypertension: Secondary | ICD-10-CM

## 2010-11-29 DIAGNOSIS — E782 Mixed hyperlipidemia: Secondary | ICD-10-CM

## 2010-11-29 DIAGNOSIS — Z79899 Other long term (current) drug therapy: Secondary | ICD-10-CM

## 2010-11-29 NOTE — Patient Instructions (Signed)
Your physician wants you to follow-up in: 6 months. You will receive a reminder letter in the mail one-two months in advance. If you don't receive a letter, please call our office to schedule the follow-up appointment. Your physician recommends that you continue on your current medications as directed. Please refer to the Current Medication list given to you today. Your physician recommends that you go to the [facility] for a FASTING lipid profile and liver function labs. Do not eat or drink after midnight.  

## 2010-11-29 NOTE — Assessment & Plan Note (Signed)
Continue present medical regimen

## 2010-11-29 NOTE — Progress Notes (Signed)
Clinical Summary Joseph [name] is a 45 y.o.male presenting for followup. He was seen in December 2011. Indicates no significant angina or unusual shortness of breath. Has had some right leg orthopedic difficulties, limiting his regular exercise until just recently. He has gained some weight in the interim.  Lab work from December 2011 showed AST 24, ALT 29, cholesterol 163, triglycerides 135, HDL 35, LDL 101. Zocor was increased to 20 mg daily at that time. He has not yet had followup blood work.  Also admits to psychosocial stressors, having some difficulties with his son.    Allergies  Allergen Reactions  . Penicillins     REACTION: rash  . Tetanus Toxoid     Current outpatient prescriptions:aspirin 325 MG EC tablet, Take 325 mg by mouth daily.  , Disp: , Rfl: ;  clopidogrel (PLAVIX) 75 MG tablet, Take 75 mg by mouth daily.  , Disp: , Rfl: ;  HYDROcodone-acetaminophen (VICODIN) 5-500 MG per tablet, Take 1 tablet by mouth daily as needed.  , Disp: , Rfl: ;  levothyroxine (SYNTHROID, LEVOTHROID) 88 MCG tablet, Take 88 mcg by mouth daily.  , Disp: , Rfl:  metoprolol succinate (TOPROL XL) 12.5 mg TB24, Take 0.5 tablets (12.5 mg total) by mouth daily., Disp: 45 tablet, Rfl: 3;  nitroGLYCERIN (NITROSTAT) 0.4 MG SL tablet, Place 0.4 mg under the tongue every 5 (five) minutes as needed.  , Disp: , Rfl: ;  simvastatin (ZOCOR) 20 MG tablet, Take 20 mg by mouth at bedtime.  , Disp: , Rfl:   Past Medical History  Diagnosis Date  . CAD (coronary artery disease)     DES LAD 2/11, LVEF 65%  . Hyperlipidemia   . Hypertension   . OSA (obstructive sleep apnea)   . Diverticulosis   . Lumbar disc disease   . Right ureteral stone     Past Surgical History  Procedure Date  . Syndesmosis repair 5/10    Right ankle fracture    Family History  Problem Relation Age of Onset  . Hypertension      Social History Joseph [name] reports that he quit smoking about 8 years ago. His smoking use included Cigarettes.  He has a 10 pack-year smoking history. He has never used smokeless tobacco. Joseph [name] reports that he does not drink alcohol.  Review of Systems No palpitations or syncope. No orthopnea or PND. Otherwise negative.  Physical Examination Filed Vitals:   11/29/10 1549  BP: 131/87  Pulse: 81   Additional Exam: Overweight male in no acute distress.  HEENT: Conjunctiva and lids normal, oropharynx clear.  Neck: Supple, no elevated chart her venous pressure or bruits.  Lungs: Clear auscultation, nonlabored.  Cardiac: Regular rate and rhythm, no S3.  Abdomen: Soft, nontender, no bruits.  Extremities: No pitting edema, right radial cath site stable. Normal radial pulses.  Skin: Warm and dry.  Musculoskeletal: No kyphosis.  Neuropsychiatric: Alert and oriented x3, affect appropriate.   ECG Normal sinus rhythm at 77 beats per minute.   Problem List and Plan

## 2010-11-29 NOTE — Assessment & Plan Note (Signed)
Due for followup fasting lipid profile and liver function tests. These will be arranged. 

## 2010-11-29 NOTE — Assessment & Plan Note (Signed)
Symptomatically stable on medical therapy. ECG is normal. Continue observation.

## 2011-02-08 ENCOUNTER — Encounter: Payer: Self-pay | Admitting: *Deleted

## 2011-02-08 NOTE — Progress Notes (Signed)
  Letter mailed reminding pt to have FLP/LFT ordered June 11th done.

## 2011-04-06 ENCOUNTER — Encounter: Payer: Self-pay | Admitting: *Deleted

## 2011-04-06 NOTE — Progress Notes (Signed)
  Certified letter mailed to pt regarding labs ordered in June for FLP/LFT.

## 2011-04-11 ENCOUNTER — Other Ambulatory Visit: Payer: Self-pay | Admitting: *Deleted

## 2011-04-11 MED ORDER — CLOPIDOGREL BISULFATE 75 MG PO TABS: 75.0000 mg | ORAL_TABLET | Freq: Every day | ORAL | Status: DC

## 2011-05-05 NOTE — Progress Notes (Signed)
Certified letter returned 04/27/11 "Unclaimed. Unable to forward."

## 2011-06-03 ENCOUNTER — Telehealth: Payer: Self-pay | Admitting: *Deleted

## 2011-06-03 NOTE — Telephone Encounter (Signed)
Left message with male at residence for pt to call. Pt needs to have FLP/LFT done, which were due in July. A certified letter was mailed to pt requesting this to do be on 10/17, but it was returned "unclaimed." Pt is also due for appt this month per reminder.

## 2011-10-18 ENCOUNTER — Other Ambulatory Visit: Payer: Self-pay | Admitting: Cardiology

## 2012-01-07 ENCOUNTER — Other Ambulatory Visit: Payer: Self-pay | Admitting: Cardiology

## 2012-04-17 ENCOUNTER — Other Ambulatory Visit: Payer: Self-pay | Admitting: Cardiology

## 2013-01-15 ENCOUNTER — Other Ambulatory Visit: Payer: Self-pay | Admitting: Cardiology

## 2013-01-15 MED ORDER — METOPROLOL SUCCINATE ER 25 MG PO TB24: 12.5000 mg | ORAL_TABLET | Freq: Every day | ORAL | Status: DC

## 2013-05-27 ENCOUNTER — Inpatient Hospital Stay (HOSPITAL_COMMUNITY)
Admission: EM | Admit: 2013-05-27 | Discharge: 2013-05-28 | DRG: 313 | Discharge status: Against Medical Advice | Payer: BC Managed Care – PPO | Attending: Cardiology | Admitting: Cardiology

## 2013-05-27 ENCOUNTER — Emergency Department (HOSPITAL_COMMUNITY): Payer: BC Managed Care – PPO

## 2013-05-27 ENCOUNTER — Encounter (HOSPITAL_COMMUNITY): Payer: Self-pay | Admitting: Emergency Medicine

## 2013-05-27 DIAGNOSIS — R079 Chest pain, unspecified: Principal | ICD-10-CM | POA: Diagnosis present

## 2013-05-27 DIAGNOSIS — Z7982 Long term (current) use of aspirin: Secondary | ICD-10-CM

## 2013-05-27 DIAGNOSIS — Z79899 Other long term (current) drug therapy: Secondary | ICD-10-CM

## 2013-05-27 DIAGNOSIS — I2510 Atherosclerotic heart disease of native coronary artery without angina pectoris: Secondary | ICD-10-CM | POA: Diagnosis present

## 2013-05-27 DIAGNOSIS — I25119 Atherosclerotic heart disease of native coronary artery with unspecified angina pectoris: Secondary | ICD-10-CM | POA: Diagnosis present

## 2013-05-27 DIAGNOSIS — I200 Unstable angina: Secondary | ICD-10-CM

## 2013-05-27 DIAGNOSIS — I10 Essential (primary) hypertension: Secondary | ICD-10-CM | POA: Diagnosis present

## 2013-05-27 DIAGNOSIS — E039 Hypothyroidism, unspecified: Secondary | ICD-10-CM | POA: Diagnosis present

## 2013-05-27 DIAGNOSIS — Z87891 Personal history of nicotine dependence: Secondary | ICD-10-CM

## 2013-05-27 DIAGNOSIS — G4733 Obstructive sleep apnea (adult) (pediatric): Secondary | ICD-10-CM | POA: Diagnosis present

## 2013-05-27 DIAGNOSIS — Z7902 Long term (current) use of antithrombotics/antiplatelets: Secondary | ICD-10-CM

## 2013-05-27 DIAGNOSIS — Z9861 Coronary angioplasty status: Secondary | ICD-10-CM

## 2013-05-27 DIAGNOSIS — E785 Hyperlipidemia, unspecified: Secondary | ICD-10-CM | POA: Diagnosis present

## 2013-05-27 HISTORY — DX: Low back pain: M54.5

## 2013-05-27 HISTORY — DX: Hypothyroidism, unspecified: E03.9

## 2013-05-27 HISTORY — DX: Unspecified osteoarthritis, unspecified site: M19.90

## 2013-05-27 HISTORY — DX: Other complications of anesthesia, initial encounter: T88.59XA

## 2013-05-27 HISTORY — DX: Dependence on other enabling machines and devices: Z99.89

## 2013-05-27 HISTORY — DX: Obstructive sleep apnea (adult) (pediatric): G47.33

## 2013-05-27 HISTORY — DX: Adverse effect of unspecified anesthetic, initial encounter: T41.45XA

## 2013-05-27 HISTORY — DX: Low back pain, unspecified: M54.50

## 2013-05-27 HISTORY — DX: Other chronic pain: G89.29

## 2013-05-27 LAB — BASIC METABOLIC PANEL
CO2: 24 mEq/L (ref 19–32)
Chloride: 107 mEq/L (ref 96–112)
Potassium: 3.7 mEq/L (ref 3.5–5.1)
Sodium: 142 mEq/L (ref 135–145)

## 2013-05-27 LAB — POCT I-STAT TROPONIN I: Troponin i, poc: 0 ng/mL (ref 0.00–0.08)

## 2013-05-27 LAB — HEMOGLOBIN A1C: Mean Plasma Glucose: 120 mg/dL — ABNORMAL HIGH (ref <117)

## 2013-05-27 LAB — TROPONIN I: Troponin I: <0.3 ng/mL (ref <0.30)

## 2013-05-27 LAB — CBC
MCV: 94.7 fL (ref 78.0–100.0)
Platelets: 200 10*3/uL (ref 150–400)
RBC: 4.71 MIL/uL (ref 4.22–5.81)
WBC: 5.5 10*3/uL (ref 4.0–10.5)

## 2013-05-27 LAB — PROTIME-INR
INR: 1.06 (ref 0.00–1.49)
Prothrombin Time: 13.6 seconds (ref 11.6–15.2)

## 2013-05-27 LAB — PRO B NATRIURETIC PEPTIDE: Pro B Natriuretic peptide (BNP): <5 pg/mL (ref 0–125)

## 2013-05-27 LAB — HEPARIN LEVEL (UNFRACTIONATED): Heparin Unfractionated: 0.4 IU/mL (ref 0.30–0.70)

## 2013-05-27 MED ORDER — ASPIRIN EC 81 MG PO TBEC
81.0000 mg | DELAYED_RELEASE_TABLET | Freq: Every day | ORAL | Status: DC
  Administered 2013-05-28: 81 mg via ORAL
  Filled 2013-05-27: qty 1

## 2013-05-27 MED ORDER — ZOLPIDEM TARTRATE 5 MG PO TABS: 5.0000 mg | ORAL_TABLET | Freq: Every evening | ORAL | Status: DC | PRN

## 2013-05-27 MED ORDER — ASPIRIN 81 MG PO CHEW
81.0000 mg | CHEWABLE_TABLET | ORAL | Status: AC
  Administered 2013-05-28: 81 mg via ORAL
  Filled 2013-05-27: qty 1

## 2013-05-27 MED ORDER — SODIUM CHLORIDE 0.9 % IJ SOLN
3.0000 mL | INTRAMUSCULAR | Status: DC | PRN
  Administered 2013-05-28: 3 mL via INTRAVENOUS

## 2013-05-27 MED ORDER — ATORVASTATIN CALCIUM 80 MG PO TABS
80.0000 mg | ORAL_TABLET | Freq: Every day | ORAL | Status: DC
  Filled 2013-05-27 (×2): qty 1

## 2013-05-27 MED ORDER — HYDROCODONE-ACETAMINOPHEN 10-325 MG PO TABS
1.0000 | ORAL_TABLET | Freq: Four times a day (QID) | ORAL | Status: DC | PRN
  Administered 2013-05-27 – 2013-05-28 (×3): 1 via ORAL
  Filled 2013-05-27 (×2): qty 1

## 2013-05-27 MED ORDER — SODIUM CHLORIDE 0.9 % IV SOLN
250.0000 mL | INTRAVENOUS | Status: DC | PRN
  Administered 2013-05-27: 250 mL via INTRAVENOUS

## 2013-05-27 MED ORDER — HEPARIN (PORCINE) IN NACL 100-0.45 UNIT/ML-% IJ SOLN
1700.0000 [IU]/h | INTRAMUSCULAR | Status: DC
  Administered 2013-05-27: 1450 [IU]/h via INTRAVENOUS
  Administered 2013-05-28: 1700 [IU]/h via INTRAVENOUS
  Filled 2013-05-27 (×3): qty 250

## 2013-05-27 MED ORDER — ASPIRIN 81 MG PO CHEW
324.0000 mg | CHEWABLE_TABLET | Freq: Once | ORAL | Status: AC
  Administered 2013-05-27: 324 mg via ORAL
  Filled 2013-05-27: qty 4

## 2013-05-27 MED ORDER — ASPIRIN 300 MG RE SUPP
300.0000 mg | RECTAL | Status: DC
  Filled 2013-05-27: qty 1

## 2013-05-27 MED ORDER — ONDANSETRON HCL 4 MG/2ML IJ SOLN: 4.0000 mg | Freq: Four times a day (QID) | INTRAMUSCULAR | Status: DC | PRN

## 2013-05-27 MED ORDER — HEPARIN BOLUS VIA INFUSION
4000.0000 [IU] | Freq: Once | INTRAVENOUS | Status: AC
  Administered 2013-05-27: 4000 [IU] via INTRAVENOUS
  Filled 2013-05-27: qty 4000

## 2013-05-27 MED ORDER — NITROGLYCERIN 0.4 MG SL SUBL: 0.4000 mg | SUBLINGUAL_TABLET | SUBLINGUAL | Status: DC | PRN

## 2013-05-27 MED ORDER — SODIUM CHLORIDE 0.9 % IJ SOLN: 3.0000 mL | INTRAMUSCULAR | Status: DC | PRN

## 2013-05-27 MED ORDER — SODIUM CHLORIDE 0.9 % IV SOLN: 250.0000 mL | INTRAVENOUS | Status: DC | PRN

## 2013-05-27 MED ORDER — LEVOTHYROXINE SODIUM 112 MCG PO TABS
112.0000 ug | ORAL_TABLET | Freq: Every day | ORAL | Status: DC
  Administered 2013-05-28: 112 ug via ORAL
  Filled 2013-05-27 (×3): qty 1

## 2013-05-27 MED ORDER — ACETAMINOPHEN 325 MG PO TABS: 650.0000 mg | ORAL_TABLET | ORAL | Status: DC | PRN

## 2013-05-27 MED ORDER — SODIUM CHLORIDE 0.9 % IV SOLN
INTRAVENOUS | Status: DC
  Administered 2013-05-28: 04:00:00 via INTRAVENOUS

## 2013-05-27 MED ORDER — SODIUM CHLORIDE 0.9 % IJ SOLN: 3.0000 mL | Freq: Two times a day (BID) | INTRAMUSCULAR | Status: DC

## 2013-05-27 MED ORDER — ASPIRIN 81 MG PO CHEW: 324.0000 mg | CHEWABLE_TABLET | ORAL | Status: DC

## 2013-05-27 MED ORDER — CLOPIDOGREL BISULFATE 75 MG PO TABS: 75.0000 mg | ORAL_TABLET | Freq: Once | ORAL | Status: DC

## 2013-05-27 MED ORDER — SODIUM CHLORIDE 0.9 % IJ SOLN
3.0000 mL | Freq: Two times a day (BID) | INTRAMUSCULAR | Status: DC
  Administered 2013-05-28: 3 mL via INTRAVENOUS

## 2013-05-27 NOTE — ED Notes (Signed)
Cardiology at bedside.

## 2013-05-27 NOTE — Progress Notes (Signed)
ANTICOAGULATION CONSULT NOTE - Initial Consult  Pharmacy Consult for heparin Indication: chest pain/ACS  Allergies  Allergen Reactions  . Tetanus Toxoid Other (See Comments)    REACTION: unknown  . Penicillins Rash    Patient Measurements:   Heparin Dosing Weight: 100.8 kg  Vital Signs: Temp: 98.4 F (36.9 C) (12/08 1017) Temp src: Oral (12/08 1017) BP: 131/86 mmHg (12/08 1430) Pulse Rate: 56 (12/08 1400)  Labs:  Recent Labs  05/27/13 1042  HGB 15.2  HCT 44.6  PLT 200  CREATININE 1.00    The CrCl is unknown because both a height and weight (above a minimum accepted value) are required for this calculation.   Medical History: Past Medical History  Diagnosis Date  . CAD (coronary artery disease)     DES LAD 2/11, LVEF 65%  . Hyperlipidemia   . Hypertension   . OSA (obstructive sleep apnea)     on CPAP  . Diverticulosis   . Lumbar disc disease   . Right ureteral stone     Medications:  Scheduled:  . aspirin  324 mg Oral NOW   Or  . aspirin  300 mg Rectal NOW  . [START ON 05/28/2013] aspirin  81 mg Oral Pre-Cath  . aspirin EC  81 mg Oral Daily  . atorvastatin  80 mg Oral q1800  . clopidogrel  75 mg Oral Once  . [START ON 05/28/2013] levothyroxine  112 mcg Oral QAC breakfast  . sodium chloride  3 mL Intravenous Q12H  . sodium chloride  3 mL Intravenous Q12H   Infusions:  . [START ON 05/28/2013] sodium chloride      Assessment: 47 yo male with chest pain will be started on heparin therapy.  Not on anticoagulation prior to admission.  Baseline Hgb 15.2 and Plt 200 K  Goal of Therapy:  Heparin level 0.3-0.7 units/ml Monitor platelets by anticoagulation protocol: Yes   Plan:  1) Heparin 4000 units iv bolus x1, then start heparin drip at 1450 units/hr 2) Check an 6h heparin level after drip is started 3) Daily heparin level and CBC  Najiyah Paris, Tsz-Yin 05/27/2013,4:29 PM

## 2013-05-27 NOTE — Progress Notes (Signed)
Pt has home cpap and places self on/off. 

## 2013-05-27 NOTE — H&P (Signed)
Patient ID: Joseph [name] MRN: [id number], DOB/AGE: 03-09-1966   Admit date: 05/27/2013   Primary Physician: Donzetta Sprung, MD Primary Cardiologist: Dr. Diona Browner  Pt. 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 It has become more severe and more frequent over the past two weeks. Symptoms come and go, but are worse with exertion and emotional stress and better with rest. He describes it as a sharp pressure and rates pain as a 5/10. It sometimes radiates to his left and right elbows at night. It is worse when he lays down. Not associated with meals. This morning he had an episode of CP and SOB and also felt "trembly" and weak, and he decided to go to the ED. He describes his symptoms as reminiscent of those before the placement of his DES in 2011.  No lightheadedness, dizziness, n/v or abdominal pain. Chronic swelling and pain in right leg secondary to broken bones from an accident while mowing his lawn. No recent fevers and chills. 17 lbs intentional weight loss in two months secondary to diet and exercise.   EKG with no acute changes, troponin negative Of note, patient's wife reports that his EKG and cardiac biomarkers were normal before 2011 stent placement with 90% occulusion in the LAD.  Problem List  Past Medical History  Diagnosis Date  . CAD (coronary artery disease)     DES LAD 2/11, LVEF 65%  . Hyperlipidemia   . Hypertension   . OSA (obstructive sleep apnea)     on CPAP  . Diverticulosis   . Lumbar disc disease   . Right ureteral stone     Past Surgical History  Procedure Laterality Date  . Syndesmosis repair  5/10    Right ankle fracture  . Coronary angioplasty with stent placement  2011    [place], LAD 50% and 90%, s/p 3.0 x 18 mm Promus DES in mid-LAD, CFX system [place],  RCA 30%     Allergies  Allergies  Allergen Reactions  . Penicillins     REACTION: rash  . Tetanus Toxoid      Home Medications  Prior to Admission medications   Medication Sig Start Date End Date Taking? Authorizing Provider  aspirin EC 81 MG tablet Take 81 mg by mouth daily.   Yes Historical Provider, MD  clopidogrel (PLAVIX) 75 MG tablet TAKE ONE TABLET BY MOUTH EVERY DAY 04/17/12  Yes Jonelle Sidle, MD  HYDROcodone-acetaminophen (LORTAB) 10-500 MG per tablet Take 1-2 tablets by mouth every 4 (four) hours as needed for pain.   Yes Historical Provider, MD  levothyroxine (SYNTHROID, LEVOTHROID) 112 MCG tablet Take 112 mcg by mouth daily before breakfast.   Yes Historical Provider, MD  metoprolol succinate (TOPROL-XL) 25 MG 24 hr tablet Take 25 mg by mouth daily. 01/15/13  Yes Jonelle Sidle, MD  nitroGLYCERIN (NITROSTAT) 0.4 MG SL tablet Place 0.4 mg under the tongue every 5 (five) minutes as needed.     Yes Historical Provider, MD  simvastatin (ZOCOR) 20 MG tablet TAKE ONE TABLET BY MOUTH AT BEDTIME 10/18/11  Yes Jonelle Sidle, MD    Family History  Family History  Problem Relation Age of Onset  . Hypertension    . Diabetes Father   . Kidney disease Father  kidney stones  . Thyroid disease Mother   . Thyroid disease Sister   . Cancer Mother     renal cancer    Social History  History   Social History  . Marital Status: Married    Spouse Name: N/A    Number of Children: N/A  . Years of Education: N/A   Occupational History  . Full Time Service Technician    Social History Main Topics  . Smoking status: Former Smoker -- 1.00 packs/day for 10 years    Types: Cigarettes    Quit date: 06/20/2002  . Smokeless tobacco: Never Used  . Alcohol Use: No  . Drug Use: No  . Sexual Activity: Not on file   Other Topics Concern  . Not on file   Social History Narrative  . No narrative on file     Review of Systems General:  No chills, fever, night  sweats or weight changes.  Cardiovascular:  No chest pain, dyspnea on exertion, edema, orthopnea, palpitations, paroxysmal nocturnal dyspnea. Dermatological: No rash, lesions/masses Respiratory: No cough, dyspnea Urologic: No hematuria, dysuria Abdominal:   No nausea, vomiting, diarrhea, bright red blood per rectum, melena, or hematemesis Neurologic:  No visual changes, wkns, changes in mental status. All other systems reviewed and are otherwise negative except as noted above.  Physical Exam  Blood pressure 121/84, pulse 56, temperature 98.4 F (36.9 C), temperature source Oral, resp. rate 18, SpO2 97.00%.  General: Pleasant, NAD Psych: Normal affect. Neuro: Alert and oriented X 3. Moves all extremities spontaneously. HEENT: Normal  Neck: Supple without bruits or JVD. Lungs:  Resp regular and unlabored, CTA. Heart: RRR no s3, s4, or murmurs. Abdomen: Soft, non-tender, non-distended, BS + x 4.  Extremities: No clubbing, cyanosis or edema. DP/PT/Radials 2+ and equal bilaterally.  Labs  No results found for this basename: CKTOTAL, CKMB, TROPONINI,  in the last 72 hours Lab Results  Component Value Date   WBC 5.5 05/27/2013   HGB 15.2 05/27/2013   HCT 44.6 05/27/2013   MCV 94.7 05/27/2013   PLT 200 05/27/2013     Recent Labs Lab 05/27/13 1042  NA 142  K 3.7  CL 107  CO2 24  BUN 13  CREATININE 1.00  CALCIUM 9.1  GLUCOSE 101*    Radiology/Studies  Dg Chest 2 View  05/27/2013   CLINICAL DATA:  Chest pain and shortness of breath.  EXAM: CHEST  2 VIEW  COMPARISON:  CT chest 10/01/2012 and 09/04/2012.  FINDINGS: Streaky left basilar atelectasis is identified. The right lung is clear. Heart size is normal. No pneumothorax or pleural fluid.  IMPRESSION: Streaky left basilar airspace opacity most consistent with atelectasis.   Cardiac Cath (08/14/2009)  1. Hemodynamics: Aorta 125/89, LV 143/17.  2. Left ventriculography: EF was estimated to be 60-65%. There were  no regional  wall motion abnormalities in the RAO projection.  3. Right coronary artery: The right coronary artery had diffuse  approximately 30% stenosis throughout the proximal portion of the  vessel. It was a dominant vessel.  4. Left main: The left main had mild luminal regularities.  5. Left circumflex system: The left circumflex itself was a large  vessel. There were first and second obtuse marginals and a PLOM  with luminal irregularities only.  6. LAD system: There was a 50% proximal LAD stenosis before the first  diagonal. There was then a small first diagonal and a moderate-  sized second diagonal. Following the second diagonal, there was a  90%  stenosis around the junction of the proximal and mid LAD. The  mid LAD had luminal irregularities.   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   Chest Pain- non currently  -- EKG with no acute changes, troponin negative -- Of note, patient's wife reports that his EKG and cardiac biomarkers were normal before 2011 stent placement with 90% occulusion in the LAD -- Chest pain reminiscent of previous cardiac pain -- Heparin per pharmacy  -- Start high dose statin  -- Put on cath board for tomorrow, NPO after midnight  OSA -- CPAP  Hypothyroidism -- Continue synthroid  Hypertension -- Continue metoprolol succinate  Hyperlipidemia -- Start Lipitor [address], STERN, KATHRYN, PA-C 05/27/2013, 2:41 PM Patient seen and examined. I agree with the assessment and plan as detailed above. See also my additional thoughts below.   The patient has unstable symptoms. He's had an intervention in the past. He was without symptoms for over one to 2 years. More recently he's had increasing chest discomfort with activity. He's  had some rest pain also. There is radiation to his arms. Most aspects of his history are very consistent with unstable angina. I'm not sure that his complaint of fatigue is related. It is appropriate for him to be admitted and treated with heparin. We will proceed with cardiac catheterization tomorrow. Willa Rough, MD, [facility] 05/27/2013 3:32 PM

## 2013-05-27 NOTE — ED Notes (Signed)
Patient transported to X-ray 

## 2013-05-27 NOTE — Progress Notes (Signed)
ANTICOAGULATION CONSULT NOTE - Follow Up Consult  Pharmacy Consult for heparin Indication: USAP  Labs:  Recent Labs  05/27/13 1042 05/27/13 1710 05/27/13 2200 05/27/13 2205  HGB 15.2  --   --   --   HCT 44.6  --   --   --   PLT 200  --   --   --   LABPROT  --  13.6  --   --   INR  --  1.06  --   --   HEPARINUNFRC  --   --   --  0.40  CREATININE 1.00  --   --   --   TROPONINI  --  <0.30 <0.30  --     Assessment: 47yo male with therapeutic heparin level though was drawn early and started late so lab actually only one hour after bolus, would expect to be higher.  Goal of Therapy:  Heparin level 0.3-0.7 units/ml Monitor platelets by anticoagulation protocol: Yes   Plan:  Assuming pt will need larger requirement given level 1hr after bolus will increase preemptively to 1700 units/hr and check level with am labs.  Vernard Gambles, PharmD, BCPS  05/27/2013,11:16 PM

## 2013-05-27 NOTE — ED Provider Notes (Signed)
CSN: [id number]     Arrival date & time 05/27/13  1006 History   First MD Initiated Contact with Patient 05/27/13 1018     Chief Complaint  Patient presents with  . Chest Pain   (Consider location/radiation/quality/duration/timing/severity/associated sxs/prior Treatment) Patient is a 47 y.o. male presenting with chest pain. The history is provided by the patient.  Chest Pain Pain location:  Substernal area Pain quality: pressure and sharp   Pain radiates to:  Does not radiate Pain radiates to the back: no   Pain severity:  Moderate Onset quality:  Gradual Duration:  2 weeks Timing:  Intermittent Progression:  Worsening Chronicity:  New Context: not eating and no trauma   Context comment:  States feels like when he had a stent 4 years ago but not as severe Relieved by:  Nothing (not affected by eating) Worsened by:  Exertion (lying down at night) Associated symptoms: palpitations and shortness of breath   Associated symptoms: no abdominal pain, no cough, no nausea, no syncope, not vomiting and no weakness   Risk factors: coronary artery disease, high cholesterol and hypertension   Risk factors: no diabetes mellitus, no immobilization, no prior DVT/PE, no smoking and no surgery     Past Medical History  Diagnosis Date  . CAD (coronary artery disease)     DES LAD 2/11, LVEF 65%  . Hyperlipidemia   . Hypertension   . OSA (obstructive sleep apnea)   . Diverticulosis   . Lumbar disc disease   . Right ureteral stone    Past Surgical History  Procedure Laterality Date  . Syndesmosis repair  5/10    Right ankle fracture   Family History  Problem Relation Age of Onset  . Hypertension     History  Substance Use Topics  . Smoking status: Former Smoker -- 1.00 packs/day for 10 years    Types: Cigarettes    Quit date: 06/20/2002  . Smokeless tobacco: Never Used  . Alcohol Use: No    Review of Systems  Respiratory: Positive for shortness of breath. Negative for cough.     Cardiovascular: Positive for chest pain and palpitations. Negative for syncope.  Gastrointestinal: Negative for nausea, vomiting and abdominal pain.  Neurological: Negative for weakness.  All other systems reviewed and are negative.    Allergies  Penicillins and Tetanus toxoid  Home Medications   Current Outpatient Rx  Name  Route  Sig  Dispense  Refill  . aspirin 325 MG EC tablet   Oral   Take 325 mg by mouth daily.           . clopidogrel (PLAVIX) 75 MG tablet      TAKE ONE TABLET BY MOUTH EVERY DAY   90 tablet   0     Patient needs to call office to schedule an appoin ...   . HYDROcodone-acetaminophen (VICODIN) 5-500 MG per tablet   Oral   Take 1 tablet by mouth daily as needed.           Marland Kitchen levothyroxine (SYNTHROID, LEVOTHROID) 88 MCG tablet   Oral   Take 88 mcg by mouth daily.           . metoprolol succinate (TOPROL-XL) 25 MG 24 hr tablet   Oral   Take 0.5 tablets (12.5 mg total) by mouth daily.   7 tablet   0     PATIENT NEEDS TO CALL [phone] TO SCHEDULE AN  ...   . nitroGLYCERIN (NITROSTAT) 0.4 MG SL tablet  Sublingual   Place 0.4 mg under the tongue every 5 (five) minutes as needed.           . simvastatin (ZOCOR) 20 MG tablet      TAKE ONE TABLET BY MOUTH AT BEDTIME   30 tablet   0     PATIENT NEED CHOLESTEROL LABS PLEASE    BP 156/107  Pulse 70  Temp(Src) 98.4 F (36.9 C) (Oral)  Resp 18  SpO2 97% Physical Exam  Nursing note and vitals reviewed. Constitutional: He is oriented to person, place, and time. He appears well-developed and well-nourished. No distress.  HENT:  Head: Normocephalic and atraumatic.  Mouth/Throat: Oropharynx is clear and moist.  Eyes: Conjunctivae and EOM are normal. Pupils are equal, round, and reactive to light.  Neck: Normal range of motion. Neck supple.  Cardiovascular: Normal rate, regular rhythm and intact distal pulses.   No murmur heard. Pulmonary/Chest: Effort normal and breath sounds  normal. No respiratory distress. He has no wheezes. He has no rales. He exhibits no tenderness.    Abdominal: Soft. He exhibits no distension. There is no tenderness. There is no rebound and no guarding.  Musculoskeletal: Normal range of motion. He exhibits no edema and no tenderness.  Neurological: He is alert and oriented to person, place, and time.  Skin: Skin is warm and dry. No rash noted. No erythema.  Psychiatric: He has a normal mood and affect. His behavior is normal.    ED Course  Procedures (including critical care time) Labs Review Labs Reviewed  BASIC METABOLIC PANEL - Abnormal; Notable for the following:    Glucose, Bld 101 (*)    GFR calc non Af Amer 87 (*)    All other components within normal limits  CBC  PRO B NATRIURETIC PEPTIDE  POCT I-STAT TROPONIN I   Imaging Review No results found.  EKG Interpretation    Date/Time:  Monday May 27 2013 10:11:57 EST Ventricular Rate:  70 PR Interval:  188 QRS Duration: 106 QT Interval:  380 QTC Calculation: 410 R Axis:   61 Text Interpretation:  Normal sinus rhythm Normal ECG No significant change since last tracing Confirmed by Tempestt Silba  MD, Cyndra Feinberg ([id number]) on 05/27/2013 10:18:12 AM            MDM   1. Chest pain     Pt with symptoms concerning for ACS with hx of stent placement 4 years ago in the LAD and known 30 and 50% lesions not stented who for the last few weeks is having CP and SOB.  Also describes heart fluttering but no signs of dysrhythmia on EKG except for occassional PAC.  ASA and NTG given. EKG wnl, CXR, CBC, BMP, trop, Coags pending.  12:40 PM Labs wnl.  Spoke with cardiology who will come and eval the pt.   Gwyneth Sprout, MD 05/27/13 1240

## 2013-05-27 NOTE — ED Notes (Signed)
Dr. Plunkett at bedside.  

## 2013-05-27 NOTE — ED Notes (Addendum)
Pt is here with chest pressure that started couple weeks ago and feels the same as when he last had a stent placed.  Pt denies sob or nausea

## 2013-05-28 ENCOUNTER — Encounter (HOSPITAL_COMMUNITY)
Admission: EM | Discharge status: Against Medical Advice | Payer: Self-pay | Source: Home / Self Care | Attending: Cardiology

## 2013-05-28 DIAGNOSIS — I2510 Atherosclerotic heart disease of native coronary artery without angina pectoris: Secondary | ICD-10-CM

## 2013-05-28 LAB — COMPREHENSIVE METABOLIC PANEL
AST: 25 U/L (ref 0–37)
Alkaline Phosphatase: 50 U/L (ref 39–117)
BUN: 12 mg/dL (ref 6–23)
CO2: 24 mEq/L (ref 19–32)
GFR calc Af Amer: >90 mL/min (ref >90)
GFR calc non Af Amer: >90 mL/min (ref >90)
Glucose, Bld: 103 mg/dL — ABNORMAL HIGH (ref 70–99)
Potassium: 3.6 mEq/L (ref 3.5–5.1)
Sodium: 140 mEq/L (ref 135–145)
Total Bilirubin: 0.3 mg/dL (ref 0.3–1.2)
Total Protein: 6.6 g/dL (ref 6.0–8.3)

## 2013-05-28 LAB — CBC
HCT: 41.5 % (ref 39.0–52.0)
Hemoglobin: 14.2 g/dL (ref 13.0–17.0)
MCH: 32.7 pg (ref 26.0–34.0)
MCHC: 34.2 g/dL (ref 30.0–36.0)
MCV: 95.6 fL (ref 78.0–100.0)
Platelets: 179 10*3/uL (ref 150–400)
RBC: 4.34 MIL/uL (ref 4.22–5.81)
RDW: 12.9 % (ref 11.5–15.5)
WBC: 6.6 10*3/uL (ref 4.0–10.5)

## 2013-05-28 LAB — LIPID PANEL
Cholesterol: 184 mg/dL (ref 0–200)
Total CHOL/HDL Ratio: 6.6 RATIO

## 2013-05-28 LAB — TSH: TSH: 1.903 u[IU]/mL (ref 0.350–4.500)

## 2013-05-28 LAB — HEPARIN LEVEL (UNFRACTIONATED): Heparin Unfractionated: 0.32 IU/mL (ref 0.30–0.70)

## 2013-05-28 LAB — TROPONIN I: Troponin I: <0.3 ng/mL (ref <0.30)

## 2013-05-28 SURGERY — LEFT HEART CATHETERIZATION WITH CORONARY ANGIOGRAM
Anesthesia: LOCAL

## 2013-05-28 NOTE — Progress Notes (Signed)
ANTICOAGULATION CONSULT NOTE - Follow Up Consult  Pharmacy Consult for heparin Indication: USAP  Labs:  Recent Labs  05/27/13 1042 05/27/13 1710 05/27/13 2200 05/27/13 2205 05/28/13 0535  HGB 15.2  --   --   --  14.2  HCT 44.6  --   --   --  41.5  PLT 200  --   --   --  179  LABPROT  --  13.6  --   --   --   INR  --  1.06  --   --   --   HEPARINUNFRC  --   --   --  0.40 0.39  CREATININE 1.00  --   --   --  0.97  TROPONINI  --  <0.30 <0.30  --   --     Assessment/Plan:  47yo male therapeutic on heparin after rate increase and sufficient time on gtt.  Will continue without change for now and confirm stable with additional level.  Vernard Gambles, PharmD, BCPS  05/28/2013,6:46 AM

## 2013-05-28 NOTE — Progress Notes (Signed)
Patient ID: Joseph [name], male   DOB: 1966/01/29, 47 y.o.   MRN: [id number]  The patient is for cath today. He is on the schedule for noon. When I admitted him yesterday, he and I discussed the fact that we would proceed with catheterization today. If he is stable and does not need intervention, he hopes to be able to go home later today. If he does need an intervention, he hopes to be a will to go home tomorrow. 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 Of course all of these plans depend on what the catheterization shows. Troponins are negative.  Jerral Bonito, MD

## 2013-05-28 NOTE — Progress Notes (Signed)
Utilization review completed. Kierstynn Babich, RN, BSN. 

## 2013-06-02 NOTE — Progress Notes (Signed)
Patient ID: Joseph [name], male   DOB: 1966/01/01, 47 y.o.   MRN: [id number]    I admitted the patient to the hospital on May 27, 2013. We made plans at that time for catheterization to be done on May 28, 2013. On the morning of May 28, 2013 I personally called the cath lab to be sure that arrangements were in place. I was told that on a preliminary basis, catheterization was scheduled for noon.  I have received notice through the medical record that there is a discharge summary to be done. I have reviewed the record completely. There are no notes explaining what happened during the day. After trying to research the situation, it is my understanding that there were multiple emergencies in the Cath Lab on May 28, 2013. Because of this, there was some schedule delay, and the patient was not called to the cath lab immediately at noon. It is my understanding that the patient became upset about this and left the hospital. At this point there is no nursing note concerning this. I am trying to obtain more information about this to complete the record.  There will be no discharge summary created until I receive more information concerning the events of the day. I was not involved in any communication about this situation.  Jerral Bonito, MD

## 2013-06-04 NOTE — Discharge Summary (Signed)
    Discharge summary:     Date patient left the hospital:     AGAINST MEDICAL ADVICE:   05/28/2013    Date of dictation...........................................................................06/04/2013   The patient was admitted to the hospital on May 27, 2013. Please refer to the complete history and physical. The patient was admitted with chest discomfort. He said that his discomfort was the same as his original ischemic symptoms. He was stable overnight in the hospital. His troponins were all normal. Hemoglobin was normal. Renal function was normal. On the morning of May 28, 2013, I personally called the cath lab to check on the scheduling. The patient was scheduled for around noon.  The events that occurred during the morning are outlined very carefully by a nursing note. There were multiple emergencies in the Cath Lab. Unfortunately the patient's cath time was being moved back. He was unhappy about this and ultimately decided to leave the hospital. Fortunately he was stable. There had been no proof of myocardial injury. He signed an AMA form as outlined by the nursing staff.   Laboratory data:    All of the blood work revealed no significant abnormalities.    Chest x-ray revealed no acute abnormality.     EKGs revealed no acute changes  Final diagnoses:       Primary diagnosis:     Chest pain with no evidence of myocardial injury    Secondary diagnoses:       Coronary artery disease       Hypertension    It is assumed that the patient resumed his home medications after he left the hospital.  At the time of discharge, there was no plan in place for his post hospital care. Our cardiology team will certainly be available to help him if he is willing to come back for outpatient followup visits.  Jerral Bonito, MD

## 2013-06-04 NOTE — Progress Notes (Signed)
Patient was determined to leave if cath lab did not come to get him by 1215.  I called the cath lab around 1205 to find out what time the patient would be coming since I had not heard anything from them up to that time.  They told me that they did not know how long it would be because the doctor had a couple of emergencies.  When I went back to the room and told the patient what was told to me, he said that he was leaving.  I explained to the patient that it could be very dangerous for him to leave since he came in having chest pain and needed a heart catheterization done.  He told me that he did not care, that they did not come and get him when they said so he was leaving.  He told me that he was told he was going to be first thing in the morning and then was changed to noon and then they still did not come to get him so who knows if he would be done that day or not.  I left patients room and called the cath lab again and asked when this patient would be able to come to the cath lab.  I was told that Dr. [place] had 2 emergencies, that he was in working on one patient at that time and there was another patient in the emergency room with active chest pain and would be next, therefore it would not be before 3 pm before this patient would be able to come to the cath lab.  I went back to tell the patient and he was already dressed and had his things packed up.  He told me to take the two IV's out of his arm and he was leaving.  I told him what I was told and he said it did not matter, he was leaving.  I asked the patient sign the AMA form, which he did and took out his IV's and he left.  Rubye Oaks

## 2014-02-06 ENCOUNTER — Inpatient Hospital Stay (HOSPITAL_COMMUNITY)
Admission: EM | Admit: 2014-02-06 | Discharge: 2014-02-11 | DRG: 956 | Disposition: A | Discharge status: Rehabilitation Facility | Payer: Worker's Compensation | Attending: Internal Medicine | Admitting: Internal Medicine

## 2014-02-06 ENCOUNTER — Encounter (HOSPITAL_COMMUNITY): Payer: Self-pay | Admitting: Emergency Medicine

## 2014-02-06 ENCOUNTER — Emergency Department (HOSPITAL_COMMUNITY): Payer: Worker's Compensation

## 2014-02-06 DIAGNOSIS — M129 Arthropathy, unspecified: Secondary | ICD-10-CM | POA: Diagnosis present

## 2014-02-06 DIAGNOSIS — Z9861 Coronary angioplasty status: Secondary | ICD-10-CM

## 2014-02-06 DIAGNOSIS — Z87891 Personal history of nicotine dependence: Secondary | ICD-10-CM

## 2014-02-06 DIAGNOSIS — Z880 Allergy status to penicillin: Secondary | ICD-10-CM

## 2014-02-06 DIAGNOSIS — T148XXA Other injury of unspecified body region, initial encounter: Secondary | ICD-10-CM

## 2014-02-06 DIAGNOSIS — E669 Obesity, unspecified: Secondary | ICD-10-CM | POA: Diagnosis present

## 2014-02-06 DIAGNOSIS — D72829 Elevated white blood cell count, unspecified: Secondary | ICD-10-CM | POA: Diagnosis present

## 2014-02-06 DIAGNOSIS — M545 Low back pain, unspecified: Secondary | ICD-10-CM | POA: Diagnosis present

## 2014-02-06 DIAGNOSIS — I25119 Atherosclerotic heart disease of native coronary artery with unspecified angina pectoris: Secondary | ICD-10-CM | POA: Diagnosis present

## 2014-02-06 DIAGNOSIS — S72033A Displaced midcervical fracture of unspecified femur, initial encounter for closed fracture: Principal | ICD-10-CM | POA: Diagnosis present

## 2014-02-06 DIAGNOSIS — I2510 Atherosclerotic heart disease of native coronary artery without angina pectoris: Secondary | ICD-10-CM | POA: Diagnosis present

## 2014-02-06 DIAGNOSIS — Z79899 Other long term (current) drug therapy: Secondary | ICD-10-CM

## 2014-02-06 DIAGNOSIS — S72002A Fracture of unspecified part of neck of left femur, initial encounter for closed fracture: Secondary | ICD-10-CM | POA: Diagnosis present

## 2014-02-06 DIAGNOSIS — G4730 Sleep apnea, unspecified: Secondary | ICD-10-CM

## 2014-02-06 DIAGNOSIS — S301XXA Contusion of abdominal wall, initial encounter: Secondary | ICD-10-CM | POA: Diagnosis present

## 2014-02-06 DIAGNOSIS — E785 Hyperlipidemia, unspecified: Secondary | ICD-10-CM | POA: Diagnosis present

## 2014-02-06 DIAGNOSIS — K5900 Constipation, unspecified: Secondary | ICD-10-CM | POA: Diagnosis present

## 2014-02-06 DIAGNOSIS — D62 Acute posthemorrhagic anemia: Secondary | ICD-10-CM | POA: Diagnosis present

## 2014-02-06 DIAGNOSIS — Z7902 Long term (current) use of antithrombotics/antiplatelets: Secondary | ICD-10-CM

## 2014-02-06 DIAGNOSIS — G4733 Obstructive sleep apnea (adult) (pediatric): Secondary | ICD-10-CM | POA: Diagnosis present

## 2014-02-06 DIAGNOSIS — S32409A Unspecified fracture of unspecified acetabulum, initial encounter for closed fracture: Secondary | ICD-10-CM | POA: Diagnosis present

## 2014-02-06 DIAGNOSIS — E039 Hypothyroidism, unspecified: Secondary | ICD-10-CM | POA: Diagnosis present

## 2014-02-06 DIAGNOSIS — Z6839 Body mass index (BMI) 39.0-39.9, adult: Secondary | ICD-10-CM

## 2014-02-06 DIAGNOSIS — Z7982 Long term (current) use of aspirin: Secondary | ICD-10-CM

## 2014-02-06 DIAGNOSIS — I200 Unstable angina: Secondary | ICD-10-CM

## 2014-02-06 DIAGNOSIS — G8929 Other chronic pain: Secondary | ICD-10-CM | POA: Diagnosis present

## 2014-02-06 DIAGNOSIS — E782 Mixed hyperlipidemia: Secondary | ICD-10-CM

## 2014-02-06 DIAGNOSIS — R011 Cardiac murmur, unspecified: Secondary | ICD-10-CM | POA: Diagnosis present

## 2014-02-06 DIAGNOSIS — S72009A Fracture of unspecified part of neck of unspecified femur, initial encounter for closed fracture: Secondary | ICD-10-CM | POA: Diagnosis present

## 2014-02-06 DIAGNOSIS — I10 Essential (primary) hypertension: Secondary | ICD-10-CM | POA: Diagnosis present

## 2014-02-06 DIAGNOSIS — S72059A Unspecified fracture of head of unspecified femur, initial encounter for closed fracture: Secondary | ICD-10-CM | POA: Diagnosis present

## 2014-02-06 DIAGNOSIS — Z887 Allergy status to serum and vaccine status: Secondary | ICD-10-CM

## 2014-02-06 DIAGNOSIS — R0902 Hypoxemia: Secondary | ICD-10-CM | POA: Diagnosis present

## 2014-02-06 DIAGNOSIS — S73006A Unspecified dislocation of unspecified hip, initial encounter: Secondary | ICD-10-CM | POA: Diagnosis present

## 2014-02-06 LAB — CBC WITH DIFFERENTIAL/PLATELET
Basophils Absolute: 0 10*3/uL (ref 0.0–0.1)
Basophils Relative: 0 % (ref 0–1)
Eosinophils Absolute: 0.1 10*3/uL (ref 0.0–0.7)
Eosinophils Relative: 1 % (ref 0–5)
HCT: 40.7 % (ref 39.0–52.0)
Hemoglobin: 13.2 g/dL (ref 13.0–17.0)
Lymphocytes Relative: 34 % (ref 12–46)
Lymphs Abs: 3.1 10*3/uL (ref 0.7–4.0)
MCH: 30.6 pg (ref 26.0–34.0)
MCHC: 32.4 g/dL (ref 30.0–36.0)
MCV: 94.2 fL (ref 78.0–100.0)
Monocytes Absolute: 0.7 10*3/uL (ref 0.1–1.0)
Monocytes Relative: 7 % (ref 3–12)
Neutro Abs: 5.3 10*3/uL (ref 1.7–7.7)
Neutrophils Relative %: 58 % (ref 43–77)
Platelets: 202 10*3/uL (ref 150–400)
RBC: 4.32 MIL/uL (ref 4.22–5.81)
RDW: 13 % (ref 11.5–15.5)
WBC: 9.1 10*3/uL (ref 4.0–10.5)

## 2014-02-06 LAB — BASIC METABOLIC PANEL
Anion gap: 12 (ref 5–15)
BUN: 16 mg/dL (ref 6–23)
CO2: 26 mEq/L (ref 19–32)
Calcium: 9 mg/dL (ref 8.4–10.5)
Chloride: 105 mEq/L (ref 96–112)
Creatinine, Ser: 0.88 mg/dL (ref 0.50–1.35)
GFR calc Af Amer: >90 mL/min (ref >90)
GFR calc non Af Amer: >90 mL/min (ref >90)
Glucose, Bld: 125 mg/dL — ABNORMAL HIGH (ref 70–99)
Potassium: 3.8 mEq/L (ref 3.7–5.3)
Sodium: 143 mEq/L (ref 137–147)

## 2014-02-06 LAB — PROTIME-INR
INR: 1.13 (ref 0.00–1.49)
Prothrombin Time: 14.5 seconds (ref 11.6–15.2)

## 2014-02-06 LAB — TYPE AND SCREEN
ABO/RH(D): AB POS
Antibody Screen: NEGATIVE

## 2014-02-06 LAB — I-STAT CHEM 8, ED
BUN: 16 mg/dL (ref 6–23)
Calcium, Ion: 1.1 mmol/L — ABNORMAL LOW (ref 1.12–1.23)
Chloride: 106 mEq/L (ref 96–112)
Creatinine, Ser: 1 mg/dL (ref 0.50–1.35)
Glucose, Bld: 132 mg/dL — ABNORMAL HIGH (ref 70–99)
HCT: 44 % (ref 39.0–52.0)
Hemoglobin: 15 g/dL (ref 13.0–17.0)
Potassium: 3.5 mEq/L — ABNORMAL LOW (ref 3.7–5.3)
Sodium: 142 mEq/L (ref 137–147)
TCO2: 28 mmol/L (ref 0–100)

## 2014-02-06 LAB — I-STAT CG4 LACTIC ACID, ED: Lactic Acid, Venous: 1.82 mmol/L (ref 0.5–2.2)

## 2014-02-06 LAB — LIPASE, BLOOD: Lipase: 31 U/L (ref 11–59)

## 2014-02-06 LAB — ABO/RH: ABO/RH(D): AB POS

## 2014-02-06 MED ORDER — FENTANYL CITRATE 0.05 MG/ML IJ SOLN
INTRAMUSCULAR | Status: AC
  Administered 2014-02-06: 100 ug via INTRAVENOUS
  Filled 2014-02-06: qty 2

## 2014-02-06 MED ORDER — FENTANYL CITRATE 0.05 MG/ML IJ SOLN
100.0000 ug | Freq: Once | INTRAMUSCULAR | Status: AC
  Administered 2014-02-06: 100 ug via INTRAVENOUS
  Filled 2014-02-06: qty 2

## 2014-02-06 MED ORDER — FENTANYL CITRATE 0.05 MG/ML IJ SOLN
100.0000 ug | Freq: Once | INTRAMUSCULAR | Status: AC
  Administered 2014-02-06: 100 ug via INTRAVENOUS

## 2014-02-06 MED ORDER — DIAZEPAM 5 MG PO TABS
5.0000 mg | ORAL_TABLET | Freq: Four times a day (QID) | ORAL | Status: DC | PRN
Start: 2014-02-06 — End: 2014-02-06
  Filled 2014-02-06: qty 1

## 2014-02-06 MED ORDER — SODIUM CHLORIDE 0.9 % IV SOLN: INTRAVENOUS | Status: DC

## 2014-02-06 MED ORDER — IOHEXOL 350 MG/ML SOLN: 100.0000 mL | Freq: Once | INTRAVENOUS | Status: DC | PRN

## 2014-02-06 MED ORDER — ETOMIDATE 2 MG/ML IV SOLN
0.1500 mg/kg | Freq: Once | INTRAVENOUS | Status: AC
  Administered 2014-02-06: 6 mg via INTRAVENOUS

## 2014-02-06 MED ORDER — SENNA 8.6 MG PO TABS: 1.0000 | ORAL_TABLET | Freq: Two times a day (BID) | ORAL | Status: DC

## 2014-02-06 MED ORDER — FENTANYL CITRATE 0.05 MG/ML IJ SOLN
100.0000 ug | Freq: Once | INTRAMUSCULAR | Status: AC
  Administered 2014-02-07: 100 ug via INTRAVENOUS
  Filled 2014-02-06: qty 2

## 2014-02-06 MED ORDER — OXYCODONE HCL 5 MG PO TABS: 5.0000 mg | ORAL_TABLET | ORAL | Status: DC | PRN

## 2014-02-06 MED ORDER — HYDROMORPHONE HCL PF 1 MG/ML IJ SOLN
1.0000 mg | Freq: Once | INTRAMUSCULAR | Status: AC
  Administered 2014-02-06: 1 mg via INTRAVENOUS
  Filled 2014-02-06: qty 1

## 2014-02-06 MED ORDER — HYDROMORPHONE HCL PF 1 MG/ML IJ SOLN
INTRAMUSCULAR | Status: AC
  Administered 2014-02-06: 1 mg
  Filled 2014-02-06: qty 1

## 2014-02-06 MED ORDER — HYDROMORPHONE HCL PF 1 MG/ML IJ SOLN
1.0000 mg | Freq: Once | INTRAMUSCULAR | Status: DC
  Filled 2014-02-06: qty 1

## 2014-02-06 MED ORDER — HYDROCODONE-ACETAMINOPHEN 5-325 MG PO TABS: 1.0000 | ORAL_TABLET | Freq: Four times a day (QID) | ORAL | Status: DC | PRN

## 2014-02-06 MED ORDER — METOPROLOL SUCCINATE ER 25 MG PO TB24: 25.0000 mg | ORAL_TABLET | Freq: Every day | ORAL | Status: DC

## 2014-02-06 MED ORDER — MAGNESIUM CITRATE PO SOLN
1.0000 | Freq: Once | ORAL | Status: DC | PRN
Start: 2014-02-06 — End: 2014-02-06

## 2014-02-06 MED ORDER — DIAZEPAM 5 MG PO TABS
5.0000 mg | ORAL_TABLET | Freq: Once | ORAL | Status: AC
  Administered 2014-02-06: 5 mg via ORAL

## 2014-02-06 MED ORDER — ONDANSETRON HCL 4 MG/2ML IJ SOLN
4.0000 mg | Freq: Once | INTRAMUSCULAR | Status: AC
  Administered 2014-02-06: 4 mg via INTRAVENOUS
  Filled 2014-02-06: qty 2

## 2014-02-06 MED ORDER — POLYETHYLENE GLYCOL 3350 17 G PO PACK: 17.0000 g | PACK | Freq: Every day | ORAL | Status: DC | PRN

## 2014-02-06 MED ORDER — NITROGLYCERIN 0.4 MG SL SUBL: 0.4000 mg | SUBLINGUAL_TABLET | SUBLINGUAL | Status: DC | PRN

## 2014-02-06 MED ORDER — MORPHINE SULFATE 2 MG/ML IJ SOLN
0.5000 mg | INTRAMUSCULAR | Status: DC | PRN
  Administered 2014-02-06: 0.5 mg via INTRAVENOUS
  Filled 2014-02-06: qty 1

## 2014-02-06 MED ORDER — LEVOTHYROXINE SODIUM 112 MCG PO TABS: 112.0000 ug | ORAL_TABLET | Freq: Every day | ORAL | Status: DC

## 2014-02-06 MED ORDER — ETOMIDATE 2 MG/ML IV SOLN
INTRAVENOUS | Status: AC
  Administered 2014-02-06: 12 mg
  Filled 2014-02-06: qty 10

## 2014-02-06 MED ORDER — SODIUM CHLORIDE 0.9 % IV BOLUS (SEPSIS)
1000.0000 mL | Freq: Once | INTRAVENOUS | Status: AC
  Administered 2014-02-06: 1000 mL via INTRAVENOUS

## 2014-02-06 MED ORDER — IOHEXOL 300 MG/ML  SOLN
100.0000 mL | Freq: Once | INTRAMUSCULAR | Status: AC | PRN
  Administered 2014-02-06: 100 mL via INTRAVENOUS

## 2014-02-06 MED ORDER — HYDROCODONE-ACETAMINOPHEN 10-325 MG PO TABS: 1.0000 | ORAL_TABLET | Freq: Four times a day (QID) | ORAL | Status: DC | PRN

## 2014-02-06 MED ORDER — FENTANYL CITRATE 0.05 MG/ML IJ SOLN
INTRAMUSCULAR | Status: AC
  Administered 2014-02-06: 100 ug
  Filled 2014-02-06: qty 2

## 2014-02-06 MED ORDER — SORBITOL 70 % SOLN: 30.0000 mL | Freq: Every day | Status: DC | PRN

## 2014-02-06 MED ORDER — HYDROMORPHONE HCL PF 1 MG/ML IJ SOLN
1.0000 mg | Freq: Once | INTRAMUSCULAR | Status: AC
  Administered 2014-02-06: 1 mg via INTRAVENOUS

## 2014-02-06 MED ORDER — ETOMIDATE 2 MG/ML IV SOLN
INTRAVENOUS | Status: AC | PRN
  Administered 2014-02-06: 6 mg via INTRAVENOUS
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
## 2014-02-06 NOTE — Sedation Documentation (Addendum)
Airway and Crash cart set up outside of room due to patient coding from sedation procedures in the past. Pt. Has non-breather on, connected to the 12-lead.
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
## 2014-02-06 NOTE — Progress Notes (Signed)
Orthopedic Tech Progress Note Patient Details:  Joseph [name] 30-Dec-1965 [id number]  Musculoskeletal Traction Type of Traction: Skeletal (Balanced Suspension) Traction Location: LLE Traction Weight: 20 lbs    Braulio Bosch 02/06/2014, 11:10 PM
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
## 2014-02-06 NOTE — Sedation Documentation (Signed)
Family at bedsider
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
## 2014-02-06 NOTE — Discharge Instructions (Signed)
Glucose:  150-200 - 2 units 200-250 - 3 units 250-300 - 4 units 300-350 - 5 units 350-400 - 6 units

## 2014-02-06 NOTE — ED Notes (Signed)
Consent for procedural reduction completed

## 2014-02-06 NOTE — Sedation Documentation (Signed)
Ortho surgeon and tech setting up to apply skeletal traction.

## 2014-02-06 NOTE — Sedation Documentation (Signed)
Respiratory at bedside.

## 2014-02-07 ENCOUNTER — Inpatient Hospital Stay (HOSPITAL_COMMUNITY): Payer: Worker's Compensation | Admitting: Anesthesiology

## 2014-02-07 ENCOUNTER — Telehealth (HOSPITAL_COMMUNITY): Payer: Self-pay | Admitting: Emergency Medicine

## 2014-02-07 ENCOUNTER — Inpatient Hospital Stay (HOSPITAL_COMMUNITY): Payer: Worker's Compensation

## 2014-02-07 ENCOUNTER — Encounter (HOSPITAL_COMMUNITY): Payer: Self-pay | Admitting: Anesthesiology

## 2014-02-07 ENCOUNTER — Encounter (HOSPITAL_COMMUNITY): Payer: Worker's Compensation | Admitting: Anesthesiology

## 2014-02-07 ENCOUNTER — Encounter (HOSPITAL_COMMUNITY)
Admission: EM | Disposition: A | Discharge status: Rehabilitation Facility | Payer: Self-pay | Source: Home / Self Care | Attending: Internal Medicine

## 2014-02-07 DIAGNOSIS — S72002A Fracture of unspecified part of neck of left femur, initial encounter for closed fracture: Secondary | ICD-10-CM | POA: Diagnosis present

## 2014-02-07 DIAGNOSIS — G4733 Obstructive sleep apnea (adult) (pediatric): Secondary | ICD-10-CM | POA: Diagnosis present

## 2014-02-07 DIAGNOSIS — S32409A Unspecified fracture of unspecified acetabulum, initial encounter for closed fracture: Secondary | ICD-10-CM

## 2014-02-07 DIAGNOSIS — I10 Essential (primary) hypertension: Secondary | ICD-10-CM

## 2014-02-07 DIAGNOSIS — S72009A Fracture of unspecified part of neck of unspecified femur, initial encounter for closed fracture: Secondary | ICD-10-CM | POA: Diagnosis not present

## 2014-02-07 DIAGNOSIS — I2510 Atherosclerotic heart disease of native coronary artery without angina pectoris: Secondary | ICD-10-CM

## 2014-02-07 HISTORY — PX: HIP PINNING,CANNULATED: SHX1758

## 2014-02-07 HISTORY — PX: ORIF ACETABULAR FRACTURE: SHX5029

## 2014-02-07 LAB — MRSA PCR SCREENING: MRSA BY PCR: NEGATIVE

## 2014-02-07 LAB — CBC
HCT: 39.7 % (ref 39.0–52.0)
HEMOGLOBIN: 13 g/dL (ref 13.0–17.0)
MCH: 31.9 pg (ref 26.0–34.0)
MCHC: 32.7 g/dL (ref 30.0–36.0)
MCV: 97.3 fL (ref 78.0–100.0)
Platelets: 182 10*3/uL (ref 150–400)
RBC: 4.08 MIL/uL — ABNORMAL LOW (ref 4.22–5.81)
RDW: 13.4 % (ref 11.5–15.5)
WBC: 13.1 10*3/uL — AB (ref 4.0–10.5)

## 2014-02-07 LAB — TROPONIN I: Troponin I: <0.3 ng/mL (ref <0.30)

## 2014-02-07 LAB — COMPREHENSIVE METABOLIC PANEL
ALK PHOS: 52 U/L (ref 39–117)
ALT: 128 U/L — ABNORMAL HIGH (ref 0–53)
ANION GAP: 12 (ref 5–15)
AST: 109 U/L — ABNORMAL HIGH (ref 0–37)
Albumin: 3.7 g/dL (ref 3.5–5.2)
BUN: 13 mg/dL (ref 6–23)
CO2: 26 mEq/L (ref 19–32)
CREATININE: 0.72 mg/dL (ref 0.50–1.35)
Calcium: 8.5 mg/dL (ref 8.4–10.5)
Chloride: 102 mEq/L (ref 96–112)
GFR calc Af Amer: >90 mL/min (ref >90)
GFR calc non Af Amer: >90 mL/min (ref >90)
GLUCOSE: 167 mg/dL — AB (ref 70–99)
Potassium: 3.9 mEq/L (ref 3.7–5.3)
Sodium: 140 mEq/L (ref 137–147)
TOTAL PROTEIN: 6.7 g/dL (ref 6.0–8.3)
Total Bilirubin: 0.7 mg/dL (ref 0.3–1.2)

## 2014-02-07 LAB — HEMOGLOBIN A1C
Hgb A1c MFr Bld: 6.2 % — ABNORMAL HIGH (ref <5.7)
Mean Plasma Glucose: 131 mg/dL — ABNORMAL HIGH (ref <117)

## 2014-02-07 LAB — TSH: TSH: 2.13 u[IU]/mL (ref 0.350–4.500)

## 2014-02-07 SURGERY — OPEN REDUCTION INTERNAL FIXATION (ORIF) ACETABULAR FRACTURE
Anesthesia: General | Site: Hip | Laterality: Left

## 2014-02-07 MED ORDER — PHENYLEPHRINE HCL 10 MG/ML IJ SOLN
10.0000 mg | INTRAVENOUS | Status: DC | PRN
  Administered 2014-02-07: 75 ug/min via INTRAVENOUS

## 2014-02-07 MED ORDER — ASPIRIN EC 81 MG PO TBEC
81.0000 mg | DELAYED_RELEASE_TABLET | Freq: Every day | ORAL | Status: DC
Start: 2014-02-07 — End: 2014-02-10
  Administered 2014-02-07 – 2014-02-10 (×4): 81 mg via ORAL
  Filled 2014-02-07 (×4): qty 1

## 2014-02-07 MED ORDER — ARTIFICIAL TEARS OP OINT
TOPICAL_OINTMENT | OPHTHALMIC | Status: AC
  Filled 2014-02-07: qty 3.5

## 2014-02-07 MED ORDER — CLOPIDOGREL BISULFATE 75 MG PO TABS: 75.0000 mg | ORAL_TABLET | Freq: Every day | ORAL | Status: DC

## 2014-02-07 MED ORDER — LEVOTHYROXINE SODIUM 100 MCG PO TABS
100.0000 ug | ORAL_TABLET | Freq: Every day | ORAL | Status: DC
  Administered 2014-02-08 – 2014-02-11 (×4): 100 ug via ORAL
  Filled 2014-02-07 (×6): qty 1

## 2014-02-07 MED ORDER — METHOCARBAMOL 1000 MG/10ML IJ SOLN
500.0000 mg | Freq: Four times a day (QID) | INTRAVENOUS | Status: DC | PRN
  Administered 2014-02-07: 500 mg via INTRAVENOUS

## 2014-02-07 MED ORDER — ROCURONIUM BROMIDE 50 MG/5ML IV SOLN
INTRAVENOUS | Status: AC
  Filled 2014-02-07: qty 1

## 2014-02-07 MED ORDER — POLYETHYLENE GLYCOL 3350 17 G PO PACK
17.0000 g | PACK | Freq: Every day | ORAL | Status: DC
  Administered 2014-02-07 – 2014-02-08 (×2): 17 g via ORAL
  Filled 2014-02-07 (×5): qty 1

## 2014-02-07 MED ORDER — LIDOCAINE HCL (CARDIAC) 20 MG/ML IV SOLN
INTRAVENOUS | Status: DC | PRN
  Administered 2014-02-07: 100 mg via INTRAVENOUS

## 2014-02-07 MED ORDER — FINASTERIDE 5 MG PO TABS
5.0000 mg | ORAL_TABLET | Freq: Every day | ORAL | Status: DC
  Administered 2014-02-08 – 2014-02-09 (×2): 5 mg via ORAL
  Filled 2014-02-07 (×7): qty 1

## 2014-02-07 MED ORDER — HYDROMORPHONE HCL PF 1 MG/ML IJ SOLN
1.0000 mg | INTRAMUSCULAR | Status: DC | PRN
  Administered 2014-02-07 (×4): 1 mg via INTRAVENOUS
  Filled 2014-02-07 (×4): qty 1

## 2014-02-07 MED ORDER — SODIUM CHLORIDE 0.9 % IR SOLN
Status: DC | PRN
  Administered 2014-02-07: 1000 mL

## 2014-02-07 MED ORDER — OXYCODONE HCL 5 MG PO TABS
5.0000 mg | ORAL_TABLET | ORAL | Status: DC | PRN
  Administered 2014-02-08 – 2014-02-09 (×2): 15 mg via ORAL
  Filled 2014-02-07 (×2): qty 3

## 2014-02-07 MED ORDER — LACTATED RINGERS IV SOLN
INTRAVENOUS | Status: DC
  Administered 2014-02-07: 22:00:00 via INTRAVENOUS

## 2014-02-07 MED ORDER — NEOSTIGMINE METHYLSULFATE 10 MG/10ML IV SOLN
INTRAVENOUS | Status: DC | PRN
  Administered 2014-02-07: 4 mg via INTRAVENOUS

## 2014-02-07 MED ORDER — CALCIUM CHLORIDE 10 % IV SOLN
INTRAVENOUS | Status: AC
  Filled 2014-02-07: qty 10

## 2014-02-07 MED ORDER — PROPOFOL 10 MG/ML IV BOLUS
INTRAVENOUS | Status: AC
  Filled 2014-02-07: qty 20

## 2014-02-07 MED ORDER — ONDANSETRON HCL 4 MG/2ML IJ SOLN: 4.0000 mg | Freq: Four times a day (QID) | INTRAMUSCULAR | Status: DC | PRN

## 2014-02-07 MED ORDER — ADULT MULTIVITAMIN W/MINERALS CH
1.0000 | ORAL_TABLET | Freq: Every day | ORAL | Status: DC
  Administered 2014-02-08 – 2014-02-11 (×4): 1 via ORAL
  Filled 2014-02-07 (×5): qty 1

## 2014-02-07 MED ORDER — DIPHENHYDRAMINE HCL 12.5 MG/5ML PO ELIX: 12.5000 mg | ORAL_SOLUTION | Freq: Four times a day (QID) | ORAL | Status: DC | PRN

## 2014-02-07 MED ORDER — METHOCARBAMOL 1000 MG/10ML IJ SOLN
500.0000 mg | Freq: Four times a day (QID) | INTRAVENOUS | Status: DC | PRN
  Filled 2014-02-07: qty 10

## 2014-02-07 MED ORDER — DOCUSATE SODIUM 100 MG PO CAPS
100.0000 mg | ORAL_CAPSULE | Freq: Two times a day (BID) | ORAL | Status: DC
  Administered 2014-02-08 – 2014-02-11 (×7): 100 mg via ORAL
  Filled 2014-02-07 (×12): qty 1

## 2014-02-07 MED ORDER — MORPHINE SULFATE 2 MG/ML IJ SOLN
2.0000 mg | INTRAMUSCULAR | Status: DC | PRN
  Administered 2014-02-07 (×2): 2 mg via INTRAVENOUS
  Filled 2014-02-07: qty 1

## 2014-02-07 MED ORDER — ACETAMINOPHEN 10 MG/ML IV SOLN
1000.0000 mg | Freq: Four times a day (QID) | INTRAVENOUS | Status: AC
  Administered 2014-02-07 – 2014-02-08 (×2): 1000 mg via INTRAVENOUS
  Filled 2014-02-07 (×2): qty 100

## 2014-02-07 MED ORDER — CEFAZOLIN SODIUM 1-5 GM-% IV SOLN
1.0000 g | Freq: Four times a day (QID) | INTRAVENOUS | Status: AC
  Administered 2014-02-07 – 2014-02-08 (×3): 1 g via INTRAVENOUS
  Filled 2014-02-07 (×3): qty 50

## 2014-02-07 MED ORDER — ENOXAPARIN SODIUM 40 MG/0.4ML ~~LOC~~ SOLN
40.0000 mg | SUBCUTANEOUS | Status: DC
  Filled 2014-02-07 (×2): qty 0.4

## 2014-02-07 MED ORDER — DEXTROSE 5 % IV SOLN
3.0000 g | Freq: Once | INTRAVENOUS | Status: AC
  Administered 2014-02-07: 3 g via INTRAVENOUS
  Filled 2014-02-07: qty 3000

## 2014-02-07 MED ORDER — LACTATED RINGERS IV SOLN
INTRAVENOUS | Status: DC | PRN
  Administered 2014-02-07 (×3): via INTRAVENOUS

## 2014-02-07 MED ORDER — SODIUM CHLORIDE 0.9 % IR SOLN
Status: DC | PRN
  Administered 2014-02-07: 3000 mL

## 2014-02-07 MED ORDER — SODIUM CHLORIDE 0.9 % IV SOLN
INTRAVENOUS | Status: DC
  Administered 2014-02-07 (×2): via INTRAVENOUS

## 2014-02-07 MED ORDER — NALOXONE HCL 0.4 MG/ML IJ SOLN: 0.4000 mg | INTRAMUSCULAR | Status: DC | PRN

## 2014-02-07 MED ORDER — MIDAZOLAM HCL 5 MG/5ML IJ SOLN
INTRAMUSCULAR | Status: DC | PRN
  Administered 2014-02-07 (×2): 1 mg via INTRAVENOUS

## 2014-02-07 MED ORDER — ZOLPIDEM TARTRATE 5 MG PO TABS: 5.0000 mg | ORAL_TABLET | Freq: Every evening | ORAL | Status: DC | PRN

## 2014-02-07 MED ORDER — ONDANSETRON HCL 4 MG PO TABS: 4.0000 mg | ORAL_TABLET | Freq: Four times a day (QID) | ORAL | Status: DC | PRN

## 2014-02-07 MED ORDER — PROPOFOL 10 MG/ML IV BOLUS
INTRAVENOUS | Status: DC | PRN
  Administered 2014-02-07: 250 mg via INTRAVENOUS

## 2014-02-07 MED ORDER — HYDROMORPHONE HCL PF 1 MG/ML IJ SOLN
INTRAMUSCULAR | Status: AC
  Filled 2014-02-07: qty 1

## 2014-02-07 MED ORDER — LISINOPRIL 10 MG PO TABS
10.0000 mg | ORAL_TABLET | Freq: Every day | ORAL | Status: DC
  Administered 2014-02-07 – 2014-02-11 (×5): 10 mg via ORAL
  Filled 2014-02-07 (×5): qty 1

## 2014-02-07 MED ORDER — ALUM & MAG HYDROXIDE-SIMETH 200-200-20 MG/5ML PO SUSP: 30.0000 mL | Freq: Four times a day (QID) | ORAL | Status: DC | PRN

## 2014-02-07 MED ORDER — OXYCODONE HCL 5 MG PO TABS
5.0000 mg | ORAL_TABLET | Freq: Once | ORAL | Status: AC | PRN
  Administered 2014-02-07: 5 mg via ORAL

## 2014-02-07 MED ORDER — PHENYLEPHRINE HCL 10 MG/ML IJ SOLN
INTRAMUSCULAR | Status: DC | PRN
  Administered 2014-02-07 (×3): 120 ug via INTRAVENOUS
  Administered 2014-02-07: 80 ug via INTRAVENOUS

## 2014-02-07 MED ORDER — FENTANYL CITRATE 0.05 MG/ML IJ SOLN
INTRAMUSCULAR | Status: DC | PRN
  Administered 2014-02-07 (×5): 50 ug via INTRAVENOUS
  Administered 2014-02-07: 200 ug via INTRAVENOUS
  Administered 2014-02-07: 50 ug via INTRAVENOUS

## 2014-02-07 MED ORDER — FENTANYL CITRATE 0.05 MG/ML IJ SOLN
INTRAMUSCULAR | Status: AC
  Filled 2014-02-07: qty 2

## 2014-02-07 MED ORDER — FENTANYL CITRATE 0.05 MG/ML IJ SOLN
100.0000 ug | Freq: Once | INTRAMUSCULAR | Status: AC
  Administered 2014-02-07: 100 ug via INTRAVENOUS

## 2014-02-07 MED ORDER — ACETAMINOPHEN 650 MG RE SUPP: 650.0000 mg | Freq: Four times a day (QID) | RECTAL | Status: DC | PRN

## 2014-02-07 MED ORDER — BISACODYL 10 MG RE SUPP
10.0000 mg | Freq: Every day | RECTAL | Status: DC | PRN
Start: 2014-02-07 — End: 2014-02-11

## 2014-02-07 MED ORDER — DIPHENHYDRAMINE HCL 50 MG/ML IJ SOLN: 12.5000 mg | Freq: Four times a day (QID) | INTRAMUSCULAR | Status: DC | PRN

## 2014-02-07 MED ORDER — MORPHINE SULFATE 2 MG/ML IJ SOLN
INTRAMUSCULAR | Status: AC
  Filled 2014-02-07: qty 1

## 2014-02-07 MED ORDER — HEPARIN SODIUM (PORCINE) 5000 UNIT/ML IJ SOLN
5000.0000 [IU] | Freq: Three times a day (TID) | INTRAMUSCULAR | Status: DC
  Filled 2014-02-07 (×3): qty 1

## 2014-02-07 MED ORDER — METHOCARBAMOL 500 MG PO TABS
500.0000 mg | ORAL_TABLET | Freq: Four times a day (QID) | ORAL | Status: DC | PRN
  Filled 2014-02-07: qty 2

## 2014-02-07 MED ORDER — ONDANSETRON HCL 4 MG PO TABS
4.0000 mg | ORAL_TABLET | Freq: Four times a day (QID) | ORAL | Status: DC | PRN
Start: 2014-02-07 — End: 2014-02-11

## 2014-02-07 MED ORDER — MIDAZOLAM HCL 2 MG/2ML IJ SOLN
INTRAMUSCULAR | Status: AC
  Filled 2014-02-07: qty 2

## 2014-02-07 MED ORDER — GLYCOPYRROLATE 0.2 MG/ML IJ SOLN
INTRAMUSCULAR | Status: DC | PRN
Start: 2014-02-07 — End: 2014-02-07
  Administered 2014-02-07: 0.6 mg via INTRAVENOUS

## 2014-02-07 MED ORDER — OXYCODONE HCL 5 MG PO TABS
ORAL_TABLET | ORAL | Status: AC
  Filled 2014-02-07: qty 1

## 2014-02-07 MED ORDER — LIDOCAINE HCL (CARDIAC) 20 MG/ML IV SOLN
INTRAVENOUS | Status: AC
  Filled 2014-02-07: qty 5

## 2014-02-07 MED ORDER — METOCLOPRAMIDE HCL 5 MG PO TABS
5.0000 mg | ORAL_TABLET | Freq: Three times a day (TID) | ORAL | Status: DC | PRN
  Filled 2014-02-07: qty 2

## 2014-02-07 MED ORDER — METOCLOPRAMIDE HCL 5 MG/ML IJ SOLN
5.0000 mg | Freq: Three times a day (TID) | INTRAMUSCULAR | Status: DC | PRN
  Filled 2014-02-07: qty 2

## 2014-02-07 MED ORDER — SIMVASTATIN 5 MG PO TABS
5.0000 mg | ORAL_TABLET | Freq: Every day | ORAL | Status: DC
  Administered 2014-02-08 – 2014-02-11 (×4): 5 mg via ORAL
  Filled 2014-02-07 (×4): qty 1

## 2014-02-07 MED ORDER — BISACODYL 10 MG RE SUPP: 10.0000 mg | Freq: Every day | RECTAL | Status: DC | PRN

## 2014-02-07 MED ORDER — FENTANYL CITRATE 0.05 MG/ML IJ SOLN
INTRAMUSCULAR | Status: AC
  Filled 2014-02-07: qty 5

## 2014-02-07 MED ORDER — ROCURONIUM BROMIDE 100 MG/10ML IV SOLN
INTRAVENOUS | Status: DC | PRN
  Administered 2014-02-07: 50 mg via INTRAVENOUS
  Administered 2014-02-07: 30 mg via INTRAVENOUS
  Administered 2014-02-07: 50 mg via INTRAVENOUS
  Administered 2014-02-07: 10 mg via INTRAVENOUS
  Administered 2014-02-07: 5 mg via INTRAVENOUS
  Administered 2014-02-07: 20 mg via INTRAVENOUS
  Administered 2014-02-07: 5 mg via INTRAVENOUS
  Administered 2014-02-07: 20 mg via INTRAVENOUS

## 2014-02-07 MED ORDER — ARTIFICIAL TEARS OP OINT
TOPICAL_OINTMENT | OPHTHALMIC | Status: DC | PRN
  Administered 2014-02-07: 1 via OPHTHALMIC

## 2014-02-07 MED ORDER — CEFAZOLIN SODIUM-DEXTROSE 2-3 GM-% IV SOLR
INTRAVENOUS | Status: AC
  Filled 2014-02-07: qty 50

## 2014-02-07 MED ORDER — ESMOLOL HCL 10 MG/ML IV SOLN
INTRAVENOUS | Status: AC
  Filled 2014-02-07: qty 10

## 2014-02-07 MED ORDER — OXYCODONE HCL 5 MG/5ML PO SOLN: 5.0000 mg | Freq: Once | ORAL | Status: AC | PRN

## 2014-02-07 MED ORDER — FOLIC ACID 1 MG PO TABS
1.0000 mg | ORAL_TABLET | Freq: Every day | ORAL | Status: DC
  Administered 2014-02-08 – 2014-02-11 (×4): 1 mg via ORAL
  Filled 2014-02-07 (×5): qty 1

## 2014-02-07 MED ORDER — DOCUSATE SODIUM 100 MG PO CAPS: 100.0000 mg | ORAL_CAPSULE | Freq: Two times a day (BID) | ORAL | Status: DC

## 2014-02-07 MED ORDER — NEOSTIGMINE METHYLSULFATE 10 MG/10ML IV SOLN
INTRAVENOUS | Status: AC
  Filled 2014-02-07: qty 1

## 2014-02-07 MED ORDER — ACETAMINOPHEN 325 MG PO TABS: 650.0000 mg | ORAL_TABLET | Freq: Four times a day (QID) | ORAL | Status: DC | PRN

## 2014-02-07 MED ORDER — GLYCOPYRROLATE 0.2 MG/ML IJ SOLN
INTRAMUSCULAR | Status: AC
  Filled 2014-02-07: qty 3

## 2014-02-07 MED ORDER — HYDROMORPHONE 0.3 MG/ML IV SOLN
INTRAVENOUS | Status: DC
Start: 2014-02-07 — End: 2014-02-07
  Filled 2014-02-07: qty 25

## 2014-02-07 MED ORDER — HYDROMORPHONE HCL PF 1 MG/ML IJ SOLN
0.2500 mg | INTRAMUSCULAR | Status: DC | PRN
  Administered 2014-02-07 (×4): 0.5 mg via INTRAVENOUS

## 2014-02-07 MED ORDER — ONDANSETRON HCL 4 MG/2ML IJ SOLN: 4.0000 mg | Freq: Once | INTRAMUSCULAR | Status: DC | PRN

## 2014-02-07 MED ORDER — VITAMIN B-1 100 MG PO TABS
100.0000 mg | ORAL_TABLET | Freq: Every day | ORAL | Status: DC
  Administered 2014-02-08 – 2014-02-11 (×4): 100 mg via ORAL
  Filled 2014-02-07 (×5): qty 1

## 2014-02-07 MED ORDER — HYDROMORPHONE HCL PF 1 MG/ML IJ SOLN
1.0000 mg | INTRAMUSCULAR | Status: DC | PRN
  Administered 2014-02-07 – 2014-02-09 (×10): 1 mg via INTRAVENOUS
  Filled 2014-02-07 (×11): qty 1

## 2014-02-07 MED ORDER — TICAGRELOR 90 MG PO TABS: 90.0000 mg | ORAL_TABLET | Freq: Two times a day (BID) | ORAL | Status: DC

## 2014-02-07 MED ORDER — OXYCODONE HCL 5 MG PO TABS: 5.0000 mg | ORAL_TABLET | Freq: Four times a day (QID) | ORAL | Status: DC | PRN

## 2014-02-07 MED ORDER — LEVOTHYROXINE SODIUM 112 MCG PO TABS
112.0000 ug | ORAL_TABLET | Freq: Every day | ORAL | Status: DC
  Administered 2014-02-07: 112 ug via ORAL
  Filled 2014-02-07 (×2): qty 1

## 2014-02-07 MED ORDER — OXYCODONE HCL 5 MG PO TABS
5.0000 mg | ORAL_TABLET | ORAL | Status: DC | PRN
  Administered 2014-02-07: 5 mg via ORAL
  Filled 2014-02-07: qty 1

## 2014-02-07 MED ORDER — ALBUMIN HUMAN 5 % IV SOLN
INTRAVENOUS | Status: DC | PRN
  Administered 2014-02-07 (×3): via INTRAVENOUS

## 2014-02-07 MED ORDER — METOPROLOL SUCCINATE ER 25 MG PO TB24
25.0000 mg | ORAL_TABLET | Freq: Every day | ORAL | Status: DC
  Administered 2014-02-07 – 2014-02-11 (×5): 25 mg via ORAL
  Filled 2014-02-07 (×5): qty 1

## 2014-02-07 MED ORDER — TICAGRELOR 90 MG PO TABS
90.0000 mg | ORAL_TABLET | Freq: Two times a day (BID) | ORAL | Status: DC
  Administered 2014-02-07 – 2014-02-11 (×8): 90 mg via ORAL
  Filled 2014-02-07 (×10): qty 1

## 2014-02-07 MED ORDER — SODIUM CHLORIDE 0.9 % IJ SOLN: 9.0000 mL | INTRAMUSCULAR | Status: DC | PRN

## 2014-02-07 MED ORDER — ONDANSETRON HCL 4 MG/2ML IJ SOLN
INTRAMUSCULAR | Status: DC | PRN
  Administered 2014-02-07: 4 mg via INTRAVENOUS

## 2014-02-07 MED ORDER — METHOCARBAMOL 500 MG PO TABS
500.0000 mg | ORAL_TABLET | Freq: Four times a day (QID) | ORAL | Status: DC | PRN
  Filled 2014-02-07: qty 1

## 2014-02-07 MED ORDER — NITROGLYCERIN 0.4 MG SL SUBL: 0.4000 mg | SUBLINGUAL_TABLET | SUBLINGUAL | Status: DC | PRN

## 2014-02-07 SURGICAL SUPPLY — 113 items
APPLIER CLIP 11 MED OPEN (CLIP)
APR CLP MED 11 20 MLT OPN (CLIP)
BIT DRILL AO MATTA 2.5MX230M (BIT) ×1 IMPLANT
BIT DRILL CANN 3.2 (BIT) ×1 IMPLANT
BIT DRILL TWST MATTA 3.5MX195M (BIT) ×1 IMPLANT
BLADE SAW SAG 73X25 THK (BLADE) ×2
BLADE SAW SGTL 73X25 THK (BLADE) ×2 IMPLANT
BLADE SURG ROTATE 9660 (MISCELLANEOUS) IMPLANT
BRUSH FEMORAL CANAL (MISCELLANEOUS) IMPLANT
BRUSH SCRUB DISP (MISCELLANEOUS) ×8 IMPLANT
CLIP APPLIE 11 MED OPEN (CLIP) IMPLANT
CLOSURE WOUND 1/2 X4 (GAUZE/BANDAGES/DRESSINGS)
COVER BACK TABLE 24X17X13 BIG (DRAPES) IMPLANT
COVER PERINEAL POST (MISCELLANEOUS) ×4 IMPLANT
COVER SURGICAL LIGHT HANDLE (MISCELLANEOUS) ×8 IMPLANT
DRAIN CHANNEL 10F 3/8 F FF (DRAIN) IMPLANT
DRAIN CHANNEL 15F RND FF W/TCR (WOUND CARE) IMPLANT
DRAPE C-ARM 42X72 X-RAY (DRAPES) ×3 IMPLANT
DRAPE C-ARMOR (DRAPES) ×4 IMPLANT
DRAPE HIP W/POCKET STRL (DRAPE) ×4 IMPLANT
DRAPE INCISE IOBAN 66X45 STRL (DRAPES) ×3 IMPLANT
DRAPE INCISE IOBAN 85X60 (DRAPES) ×8 IMPLANT
DRAPE ORTHO SPLIT 77X108 STRL (DRAPES) ×8
DRAPE STERI IOBAN 125X83 (DRAPES) ×4 IMPLANT
DRAPE SURG ORHT 6 SPLT 77X108 (DRAPES) ×4 IMPLANT
DRAPE U-SHAPE 47X51 STRL (DRAPES) ×4 IMPLANT
DRILL BIT 7/64X5 (BIT) ×4 IMPLANT
DRILL BIT AO MATTA 2.5MX230M (BIT) ×4
DRILL BIT CANN 3.2 (BIT) ×4
DRILL TWIST AO MATTA 3.5MX195M (BIT) ×4
DRSG ADAPTIC 3X8 NADH LF (GAUZE/BANDAGES/DRESSINGS) ×1 IMPLANT
DRSG MEPILEX BORDER 4X12 (GAUZE/BANDAGES/DRESSINGS) ×3 IMPLANT
DRSG MEPILEX BORDER 4X4 (GAUZE/BANDAGES/DRESSINGS) ×4 IMPLANT
DRSG MEPILEX BORDER 4X8 (GAUZE/BANDAGES/DRESSINGS) IMPLANT
DRSG PAD ABDOMINAL 8X10 ST (GAUZE/BANDAGES/DRESSINGS) ×2 IMPLANT
ELECT BLADE 6.5 EXT (BLADE) IMPLANT
ELECT CAUTERY BLADE 6.4 (BLADE) ×4 IMPLANT
ELECT REM PT RETURN 9FT ADLT (ELECTROSURGICAL) ×4
ELECTRODE REM PT RTRN 9FT ADLT (ELECTROSURGICAL) ×2 IMPLANT
EVACUATOR 1/8 PVC DRAIN (DRAIN) IMPLANT
EVACUATOR SILICONE 100CC (DRAIN) ×1 IMPLANT
GAUZE SPONGE 4X4 12PLY STRL (GAUZE/BANDAGES/DRESSINGS) ×1 IMPLANT
GAUZE SPONGE 4X4 16PLY XRAY LF (GAUZE/BANDAGES/DRESSINGS) ×4 IMPLANT
GLOVE BIO SURGEON STRL SZ7.5 (GLOVE) ×4 IMPLANT
GLOVE BIO SURGEON STRL SZ8 (GLOVE) ×4 IMPLANT
GLOVE BIOGEL PI IND STRL 7.5 (GLOVE) ×2 IMPLANT
GLOVE BIOGEL PI IND STRL 8 (GLOVE) ×2 IMPLANT
GLOVE BIOGEL PI INDICATOR 7.5 (GLOVE) ×2
GLOVE BIOGEL PI INDICATOR 8 (GLOVE) ×2
GOWN STRL REUS W/ TWL LRG LVL3 (GOWN DISPOSABLE) ×4 IMPLANT
GOWN STRL REUS W/ TWL XL LVL3 (GOWN DISPOSABLE) ×2 IMPLANT
GOWN STRL REUS W/TWL 2XL LVL3 (GOWN DISPOSABLE) ×4 IMPLANT
GOWN STRL REUS W/TWL LRG LVL3 (GOWN DISPOSABLE) ×8
GOWN STRL REUS W/TWL XL LVL3 (GOWN DISPOSABLE) ×4
GUIDEWIRE 1.6 (WIRE) ×4
GUIDEWIRE ORTH 220X1.6XTROC (WIRE) ×1 IMPLANT
HANDPIECE INTERPULSE COAX TIP (DISPOSABLE)
IMMOBILIZER KNEE 20 (SOFTGOODS) IMPLANT
IMMOBILIZER KNEE 22 UNIV (SOFTGOODS) IMPLANT
IMMOBILIZER KNEE 24 THIGH 36 (MISCELLANEOUS) IMPLANT
IMMOBILIZER KNEE 24 UNIV (MISCELLANEOUS)
K-WIRE 3.2X150 (WIRE) ×12
KIT BASIN OR (CUSTOM PROCEDURE TRAY) ×4 IMPLANT
KIT ROOM TURNOVER OR (KITS) ×4 IMPLANT
KWIRE 3.2X150 (WIRE) ×3 IMPLANT
LIGHT ORTHO (MISCELLANEOUS) IMPLANT
LINER BOOT UNIVERSAL DISP (MISCELLANEOUS) ×4 IMPLANT
LOOP VESSEL MAXI BLUE (MISCELLANEOUS) IMPLANT
MANIFOLD NEPTUNE II (INSTRUMENTS) ×4 IMPLANT
NDL MAYO TROCAR (NEEDLE) ×1 IMPLANT
NEEDLE MAYO TROCAR (NEEDLE) ×4 IMPLANT
NS IRRIG 1000ML POUR BTL (IV SOLUTION) ×4 IMPLANT
PACK GENERAL/GYN (CUSTOM PROCEDURE TRAY) ×4 IMPLANT
PACK TOTAL JOINT (CUSTOM PROCEDURE TRAY) ×4 IMPLANT
PAD ARMBOARD 7.5X6 YLW CONV (MISCELLANEOUS) ×8 IMPLANT
PASSER SUT SWANSON 36MM LOOP (INSTRUMENTS) ×4 IMPLANT
PILLOW ABDUCTION HIP (SOFTGOODS) ×4 IMPLANT
PIN APEX 6X180MM EXFIX (EXFIX) ×3 IMPLANT
PLATE ACET STRT 94.5M 8H (Plate) ×3 IMPLANT
PRESSURIZER FEMORAL UNIV (MISCELLANEOUS) IMPLANT
RETRIEVER SUT HEWSON (MISCELLANEOUS) IMPLANT
SCREW 3.5X46MM (Screw) ×3 IMPLANT
SCREW COMPRESISON HL 4.5X44MM (Screw) ×3 IMPLANT
SCREW COMPRESISON HL 4.5X48MM (Screw) ×3 IMPLANT
SCREW CORT 2.5XFT 44X3.5XST (Screw) ×1 IMPLANT
SCREW CORT 48X3.5XST NS LF (Screw) ×1 IMPLANT
SCREW CORTEX ST MATTA 3.5X36MM (Screw) ×3 IMPLANT
SCREW CORTEX ST MATTA 3.5X40MM (Screw) ×3 IMPLANT
SCREW CORTICAL 3.5X44MM (Screw) ×4 IMPLANT
SCREW CORTICAL 3.5X48MM (Screw) ×4 IMPLANT
SET HNDPC FAN SPRY TIP SCT (DISPOSABLE) IMPLANT
SPONGE LAP 18X18 X RAY DECT (DISPOSABLE) ×12 IMPLANT
STAPLER VISISTAT 35W (STAPLE) ×4 IMPLANT
STRIP CLOSURE SKIN 1/2X4 (GAUZE/BANDAGES/DRESSINGS) ×1 IMPLANT
SUCTION FRAZIER TIP 10 FR DISP (SUCTIONS) ×4 IMPLANT
SUT ETHILON 3 0 PS 1 (SUTURE) ×6 IMPLANT
SUT FIBERWIRE #2 38 T-5 BLUE (SUTURE) ×8
SUT VIC AB 0 CT1 27 (SUTURE) ×4
SUT VIC AB 0 CT1 27XBRD ANBCTR (SUTURE) ×2 IMPLANT
SUT VIC AB 1 CT1 18XCR BRD 8 (SUTURE) ×2 IMPLANT
SUT VIC AB 1 CT1 27 (SUTURE) ×8
SUT VIC AB 1 CT1 27XBRD ANBCTR (SUTURE) ×3 IMPLANT
SUT VIC AB 1 CT1 8-18 (SUTURE) ×4
SUT VIC AB 1 CTB1 27 (SUTURE) ×8 IMPLANT
SUT VIC AB 2-0 CT1 27 (SUTURE) ×12
SUT VIC AB 2-0 CT1 36 (SUTURE) ×8 IMPLANT
SUT VIC AB 2-0 CT1 TAPERPNT 27 (SUTURE) ×4 IMPLANT
SUTURE FIBERWR #2 38 T-5 BLUE (SUTURE) ×4 IMPLANT
TOWEL OR 17X24 6PK STRL BLUE (TOWEL DISPOSABLE) ×4 IMPLANT
TOWEL OR 17X26 10 PK STRL BLUE (TOWEL DISPOSABLE) ×8 IMPLANT
TOWER CARTRIDGE SMART MIX (DISPOSABLE) IMPLANT
TRAY FOLEY CATH 16FRSI W/METER (SET/KITS/TRAYS/PACK) IMPLANT
WATER STERILE IRR 1000ML POUR (IV SOLUTION) ×12 IMPLANT

## 2014-02-07 NOTE — Progress Notes (Signed)
Orthopedic Tech Progress Note Patient Details:  Joseph [name] May 15, 1966 [id number] OHF not to be applied until after surgery. Patient in skeletal traction and not cleared to move independently.  Patient ID: Joseph [name], male   DOB: 1965/09/08, 48 y.o.   MRN: [id number]   [name] 02/07/2014, 11:48 AM

## 2014-02-07 NOTE — Consult Note (Signed)
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     5. OSA  Continues CPAP  6. DVT and PE prophylaxis  Discontinue heparin today in preparation for surgery  Continue SCDs  7. Disposition  Or today for repair of his left acetabulum and femoral head  Coags ordered pre-op   Pt has active T&S    Jari Pigg, PA-C [facility] [phone] (P) 02/07/2014, 10:29 AM

## 2014-02-07 NOTE — Anesthesia Preprocedure Evaluation (Addendum)
Anesthesia Evaluation  Patient identified by MRN, date of birth, ID band Patient awake    Reviewed: Allergy & Precautions, H&P , NPO status , Patient's Chart, lab work & pertinent test results  History of Anesthesia Complications (+) history of anesthetic complications  Airway Mallampati: II TM Distance: >3 FB Neck ROM: Full    Dental  (+) Teeth Intact, Dental Advisory Given   Pulmonary sleep apnea and Continuous Positive Airway Pressure Ventilation , former smoker,          Cardiovascular hypertension, Pt. on medications + angina + CAD and + Cardiac Stents     Neuro/Psych    GI/Hepatic   Endo/Other  Hypothyroidism   Renal/GU Renal disease     Musculoskeletal   Abdominal   Peds  Hematology   Anesthesia Other Findings Drug El cardiac stents placed approx 2 wks ago in [place].  Reproductive/Obstetrics                          Anesthesia Physical Anesthesia Plan  ASA: III  Anesthesia Plan: General   Post-op Pain Management:    Induction: Intravenous  Airway Management Planned: Oral ETT  Additional Equipment:   Intra-op Plan:   Post-operative Plan: Extubation in OR  Informed Consent: I have reviewed the patients History and Physical, chart, labs and discussed the procedure including the risks, benefits and alternatives for the proposed anesthesia with the patient or authorized representative who has indicated his/her understanding and acceptance.   Dental advisory given  Plan Discussed with: Anesthesiologist, Surgeon and CRNA  Anesthesia Plan Comments:        Anesthesia Quick Evaluation
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
## 2014-02-07 NOTE — Progress Notes (Signed)
Pt just arrived from ED. Pt's home CPAP unit set up at bedside. Pt does not want to wear CPAP at this time. Pt and pt's family instructed to call RT if needed. RT will continue to monitor.
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
## 2014-02-07 NOTE — Consult Note (Signed)
I have reviewed the patient's CT scan also, and there is no indication of intra-abdominal injury.  This patient has been seen and I agree with the findings and treatment plan.  Kathryne Eriksson. Dahlia Bailiff, MD, [facility] [phone] (pager) [phone] (direct pager) Trauma Surgeon

## 2014-02-07 NOTE — ED Notes (Signed)
Admitting physician at bedside
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
## 2014-02-07 NOTE — Progress Notes (Signed)
Utilization review completed.  

## 2014-02-07 NOTE — Anesthesia Procedure Notes (Signed)
Procedure Name: Intubation Date/Time: 02/07/2014 2:28 PM Performed by: Susa Loffler Pre-anesthesia Checklist: Patient identified, Timeout performed, Emergency Drugs available, Suction available and Patient being monitored Patient Re-evaluated:Patient Re-evaluated prior to inductionOxygen Delivery Method: Circle system utilized Preoxygenation: Pre-oxygenation with 100% oxygen Intubation Type: IV induction Ventilation: Two handed mask ventilation required and Oral airway inserted - appropriate to patient size Laryngoscope Size: Mac and 4 Grade View: Grade II Tube type: Oral Tube size: 7.5 mm Number of attempts: 1 Airway Equipment and Method: Stylet Placement Confirmation: ETT inserted through vocal cords under direct vision,  positive ETCO2 and breath sounds checked- equal and bilateral Secured at: 24 cm Tube secured with: Tape Dental Injury: Teeth and Oropharynx as per pre-operative assessment
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
## 2014-02-07 NOTE — Brief Op Note (Signed)
02/06/2014 - 02/07/2014  6:13 PM  PATIENT:  Joseph [name]  48 y.o. male  PRE-OPERATIVE DIAGNOSIS:   1. Left acetabular posterior wall fracture  2. Left femoral head fracture 3. Left hip dislocation  POST-OPERATIVE DIAGNOSIS:  1. Left acetabular posterior wall fracture  2. Left femoral head fracture 3. Left hip dislocation  PROCEDURE:  Procedure(s): 1. OPEN REDUCTION LEFT HIP 2. OPEN REDUCTION INTERNAL FIXATION (ORIF) FEMORAL HEAD FRACTURE 3. OPEN REDUCTION OF LEFT ACETABULAR FRACTURE (Left) POSTERIOR WALL  SURGEON:  Surgeon(s) and Role:    * [name], MD - Primary  PHYSICIAN ASSISTANT: Ainsley Spinner, PA-C  ANESTHESIA:   general  I/O:  Total I/O In: 9753 [I.V.:2000; IV Piggyback:750] Out: 1600 [Urine:750; Blood:850]  SPECIMEN:  No Specimen  TOURNIQUET:  * No tourniquets in log *  DICTATION: .Other Dictation: Dictation Number [id number]

## 2014-02-07 NOTE — Progress Notes (Signed)
Pt c/o pain rated "20" on 0-10 scale. Fentanyl 100 mcg given and now rates pain "7"

## 2014-02-07 NOTE — Transfer of Care (Signed)
Immediate Anesthesia Transfer of Care Note  Patient: Joseph [name]  Procedure(s) Performed: Procedure(s): OPEN REDUCTION INTERNAL FIXATION (ORIF) ACETABULAR FRACTURE (Left) CANNULATED HIP PINNING (Left)  Patient Location: PACU  Anesthesia Type:General  Level of Consciousness: sedated, patient cooperative and responds to stimulation  Airway & Oxygen Therapy: Patient Spontanous Breathing and Patient connected to nasal cannula oxygen  Post-op Assessment: Report given to PACU RN, Post -op Vital signs reviewed and stable and Patient moving all extremities X 4  Post vital signs: Reviewed and stable  Complications: No apparent anesthesia complications

## 2014-02-07 NOTE — Progress Notes (Signed)
Note and chart reviewed. Pt appears well nourished with a good appetite. No nutrition interventions needed at this time.  Pryor Ochoa RD, LDN Pager: [phone]
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
## 2014-02-07 NOTE — Anesthesia Postprocedure Evaluation (Signed)
  Anesthesia Post-op Note  Patient: Joseph [name]  Procedure(s) Performed: Procedure(s): OPEN REDUCTION INTERNAL FIXATION (ORIF) ACETABULAR FRACTURE (Left) CANNULATED HIP PINNING (Left)  Patient Location: PACU  Anesthesia Type: General   Level of Consciousness: awake, alert  and oriented  Airway and Oxygen Therapy: Patient Spontanous Breathing  Post-op Pain: mild  Post-op Assessment: Post-op Vital signs reviewed  Post-op Vital Signs: Reviewed  Last Vitals:  Filed Vitals:   02/07/14 1930  BP: 149/78  Pulse: 102  Temp:   Resp: 20    Complications: No apparent anesthesia complications
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
## 2014-02-07 NOTE — Consult Note (Signed)
I have seen and examined the patient. I agree with the findings above.  Left femoral head and posterior wall acetabular fracture dislocation Stent last Tues of LAD, normal EF and no other significant stenosis; d/w Cards by Dr. Tamala Julian, [place]  No CP since stent placement Trauma has consulted.   LLE Traction bow in place  No traumatic wounds, ecchymosis, or rash  Tender hip  No knee effusions  Sens DPN, SPN, TN intact  Motor grossly intact distally  DP 2+, PT 2+, No significant edema   I discussed with the patient and his wife the risks and benefits of surgery, including the possibility of heart attack, stroke, infection, nerve injury, vessel injury, wound breakdown, arthritis, symptomatic hardware, DVT/ PE, loss of motion, malunion, nonunion, and need for further surgery among others.  We also specifically discussed the need to stage surgery because of the elevated risk of soft tissue breakdown that could lead to amputation.  They understood these risks and wished to proceed.   [name], MD 02/07/2014 1:58 PM

## 2014-02-07 NOTE — Addendum Note (Signed)
Addendum created 02/07/14 2005 by Napoleon Form, MD   Modules edited: Orders, PRL Based Order Sets

## 2014-02-08 DIAGNOSIS — D62 Acute posthemorrhagic anemia: Secondary | ICD-10-CM

## 2014-02-08 DIAGNOSIS — I200 Unstable angina: Secondary | ICD-10-CM

## 2014-02-08 DIAGNOSIS — S329XXA Fracture of unspecified parts of lumbosacral spine and pelvis, initial encounter for closed fracture: Secondary | ICD-10-CM

## 2014-02-08 LAB — POCT I-STAT 4, (NA,K, GLUC, HGB,HCT)
Glucose, Bld: 133 mg/dL — ABNORMAL HIGH (ref 70–99)
HEMATOCRIT: 35 % — AB (ref 39.0–52.0)
Hemoglobin: 11.9 g/dL — ABNORMAL LOW (ref 13.0–17.0)
Potassium: 4.4 mEq/L (ref 3.7–5.3)
SODIUM: 138 meq/L (ref 137–147)

## 2014-02-08 LAB — COMPREHENSIVE METABOLIC PANEL
ALK PHOS: 42 U/L (ref 39–117)
ALT: 94 U/L — ABNORMAL HIGH (ref 0–53)
ANION GAP: 11 (ref 5–15)
AST: 127 U/L — ABNORMAL HIGH (ref 0–37)
Albumin: 3.6 g/dL (ref 3.5–5.2)
BUN: 13 mg/dL (ref 6–23)
CO2: 29 meq/L (ref 19–32)
Calcium: 8.4 mg/dL (ref 8.4–10.5)
Chloride: 101 mEq/L (ref 96–112)
Creatinine, Ser: 0.8 mg/dL (ref 0.50–1.35)
GLUCOSE: 116 mg/dL — AB (ref 70–99)
Potassium: 4.5 mEq/L (ref 3.7–5.3)
Sodium: 141 mEq/L (ref 137–147)
TOTAL PROTEIN: 6.2 g/dL (ref 6.0–8.3)
Total Bilirubin: 1.2 mg/dL (ref 0.3–1.2)

## 2014-02-08 MED ORDER — ACETAMINOPHEN 500 MG PO TABS
1000.0000 mg | ORAL_TABLET | Freq: Four times a day (QID) | ORAL | Status: DC
  Administered 2014-02-08 – 2014-02-09 (×3): 1000 mg via ORAL
  Filled 2014-02-08 (×6): qty 2

## 2014-02-08 MED ORDER — POLYETHYLENE GLYCOL 3350 17 G PO PACK: 17.0000 g | PACK | Freq: Every day | ORAL | Status: DC

## 2014-02-08 MED ORDER — ENOXAPARIN SODIUM 40 MG/0.4ML ~~LOC~~ SOLN
40.0000 mg | SUBCUTANEOUS | Status: DC
  Administered 2014-02-08 – 2014-02-11 (×4): 40 mg via SUBCUTANEOUS
  Filled 2014-02-08 (×5): qty 0.4
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
## 2014-02-08 NOTE — Progress Notes (Signed)
Ortho tech called about the overhead bed frame with the trapeze bar.

## 2014-02-08 NOTE — Progress Notes (Signed)
Patient had home CPAP on when RT entered the room. RT will continue to monitor.
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
## 2014-02-08 NOTE — Progress Notes (Signed)
Orthopedic Tech Progress Note Patient Details:  Joseph [name] 15-May-1966 [id number]  Patient ID: Joseph [name], male   DOB: 08/10/1965, 48 y.o.   MRN: [id number]   Joseph [name] 02/08/2014, 5:19 PMTrapeze bar

## 2014-02-08 NOTE — Progress Notes (Signed)
Pt has home cpap and places self on/off. Rt will continue to monitor.

## 2014-02-08 NOTE — Progress Notes (Signed)
Orthopedic Tech Progress Note Patient Details:  Joseph [name] 1965-11-29 [id number]  Ortho Devices Type of Ortho Device: Abdominal binder   Joseph [name] 02/08/2014, 5:21 PM
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
## 2014-02-09 DIAGNOSIS — S301XXA Contusion of abdominal wall, initial encounter: Secondary | ICD-10-CM

## 2014-02-09 DIAGNOSIS — D72829 Elevated white blood cell count, unspecified: Secondary | ICD-10-CM

## 2014-02-09 DIAGNOSIS — I517 Cardiomegaly: Secondary | ICD-10-CM

## 2014-02-09 LAB — BASIC METABOLIC PANEL
ANION GAP: 11 (ref 5–15)
BUN: 15 mg/dL (ref 6–23)
CALCIUM: 8.4 mg/dL (ref 8.4–10.5)
CHLORIDE: 98 meq/L (ref 96–112)
CO2: 29 mEq/L (ref 19–32)
CREATININE: 0.71 mg/dL (ref 0.50–1.35)
GFR calc Af Amer: >90 mL/min (ref >90)
GFR calc non Af Amer: >90 mL/min (ref >90)
Glucose, Bld: 121 mg/dL — ABNORMAL HIGH (ref 70–99)
Potassium: 4.1 mEq/L (ref 3.7–5.3)
Sodium: 138 mEq/L (ref 137–147)

## 2014-02-09 LAB — CBC WITH DIFFERENTIAL/PLATELET
Basophils Absolute: 0 10*3/uL (ref 0.0–0.1)
Basophils Relative: 0 % (ref 0–1)
Eosinophils Absolute: 0 10*3/uL (ref 0.0–0.7)
Eosinophils Relative: 0 % (ref 0–5)
HEMATOCRIT: 29.2 % — AB (ref 39.0–52.0)
Hemoglobin: 9.7 g/dL — ABNORMAL LOW (ref 13.0–17.0)
Lymphocytes Relative: 13 % (ref 12–46)
Lymphs Abs: 2.3 10*3/uL (ref 0.7–4.0)
MCH: 32 pg (ref 26.0–34.0)
MCHC: 33.2 g/dL (ref 30.0–36.0)
MCV: 96.4 fL (ref 78.0–100.0)
MONO ABS: 1.7 10*3/uL — AB (ref 0.1–1.0)
Monocytes Relative: 10 % (ref 3–12)
Neutro Abs: 13.4 10*3/uL — ABNORMAL HIGH (ref 1.7–7.7)
Neutrophils Relative %: 77 % (ref 43–77)
Platelets: 173 10*3/uL (ref 150–400)
RBC: 3.03 MIL/uL — ABNORMAL LOW (ref 4.22–5.81)
RDW: 13.4 % (ref 11.5–15.5)
WBC: 17.4 10*3/uL — ABNORMAL HIGH (ref 4.0–10.5)

## 2014-02-09 MED ORDER — OXYCODONE HCL 5 MG PO TABS
5.0000 mg | ORAL_TABLET | Freq: Four times a day (QID) | ORAL | Status: DC | PRN
  Administered 2014-02-10 (×3): 5 mg via ORAL
  Filled 2014-02-09 (×3): qty 1

## 2014-02-09 MED ORDER — OXYCODONE-ACETAMINOPHEN 5-325 MG PO TABS
1.0000 | ORAL_TABLET | Freq: Four times a day (QID) | ORAL | Status: DC | PRN
  Administered 2014-02-10 (×3): 1 via ORAL
  Filled 2014-02-09 (×3): qty 1

## 2014-02-09 MED ORDER — OXYCODONE HCL 5 MG PO TABS
5.0000 mg | ORAL_TABLET | ORAL | Status: DC | PRN
  Administered 2014-02-09: 5 mg via ORAL
  Filled 2014-02-09: qty 1

## 2014-02-09 MED ORDER — OXYCODONE-ACETAMINOPHEN 5-325 MG PO TABS
1.0000 | ORAL_TABLET | Freq: Four times a day (QID) | ORAL | Status: DC | PRN
  Administered 2014-02-09: 2 via ORAL
  Filled 2014-02-09: qty 2

## 2014-02-09 MED ORDER — OXYCODONE HCL 5 MG PO TABS: 5.0000 mg | ORAL_TABLET | Freq: Four times a day (QID) | ORAL | Status: DC | PRN

## 2014-02-09 NOTE — Progress Notes (Signed)
Checked with patient regarding home CPAP needs.  None expressed at this time.  Patient able to place CPAP on himself.
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
## 2014-02-09 NOTE — Progress Notes (Signed)
agree

## 2014-02-09 NOTE — Progress Notes (Signed)
Rehab Admissions Coordinator Note:  Patient was screened by Cleatrice Burke for appropriateness for an Inpatient Acute Rehab Consult.  At this time, we await ip rehab consult completion Monday for rehab venue options.Cleatrice Burke 02/09/2014, 11:38 AM  I can be reached at [phone].

## 2014-02-09 NOTE — Progress Notes (Signed)
Doing well post operatively from a CV standpoint No new recs Awaiting echo

## 2014-02-09 NOTE — Progress Notes (Signed)
  Echocardiogram 2D Echocardiogram has been performed.  Joseph [name] 02/09/2014, 11:45 AM
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
## 2014-02-10 ENCOUNTER — Ambulatory Visit
Admit: 2014-02-10 | Discharge: 2014-02-10 | Disposition: A | Discharge status: Home / Self Care | Payer: BC Managed Care – PPO | Attending: Radiation Oncology | Admitting: Radiation Oncology

## 2014-02-10 ENCOUNTER — Encounter: Payer: Self-pay | Admitting: Radiation Oncology

## 2014-02-10 ENCOUNTER — Ambulatory Visit
Admit: 2014-02-10 | Discharge: 2014-02-10 | Disposition: A | Discharge status: Home / Self Care | Payer: Worker's Compensation | Attending: Radiation Oncology | Admitting: Radiation Oncology

## 2014-02-10 DIAGNOSIS — K560 Paralytic ileus: Secondary | ICD-10-CM

## 2014-02-10 DIAGNOSIS — G4733 Obstructive sleep apnea (adult) (pediatric): Secondary | ICD-10-CM | POA: Insufficient documentation

## 2014-02-10 DIAGNOSIS — E039 Hypothyroidism, unspecified: Secondary | ICD-10-CM | POA: Diagnosis not present

## 2014-02-10 DIAGNOSIS — Z9989 Dependence on other enabling machines and devices: Secondary | ICD-10-CM | POA: Insufficient documentation

## 2014-02-10 DIAGNOSIS — Z7901 Long term (current) use of anticoagulants: Secondary | ICD-10-CM | POA: Insufficient documentation

## 2014-02-10 DIAGNOSIS — I2510 Atherosclerotic heart disease of native coronary artery without angina pectoris: Secondary | ICD-10-CM | POA: Diagnosis not present

## 2014-02-10 DIAGNOSIS — Z87891 Personal history of nicotine dependence: Secondary | ICD-10-CM | POA: Diagnosis not present

## 2014-02-10 DIAGNOSIS — Z9861 Coronary angioplasty status: Secondary | ICD-10-CM | POA: Insufficient documentation

## 2014-02-10 DIAGNOSIS — I10 Essential (primary) hypertension: Secondary | ICD-10-CM | POA: Insufficient documentation

## 2014-02-10 DIAGNOSIS — Z510 Encounter for antineoplastic radiation therapy: Secondary | ICD-10-CM | POA: Diagnosis not present

## 2014-02-10 DIAGNOSIS — S32409A Unspecified fracture of unspecified acetabulum, initial encounter for closed fracture: Secondary | ICD-10-CM | POA: Insufficient documentation

## 2014-02-10 DIAGNOSIS — S72033A Displaced midcervical fracture of unspecified femur, initial encounter for closed fracture: Secondary | ICD-10-CM

## 2014-02-10 DIAGNOSIS — E782 Mixed hyperlipidemia: Secondary | ICD-10-CM | POA: Diagnosis not present

## 2014-02-10 DIAGNOSIS — R011 Cardiac murmur, unspecified: Secondary | ICD-10-CM | POA: Diagnosis present

## 2014-02-10 DIAGNOSIS — S329XXA Fracture of unspecified parts of lumbosacral spine and pelvis, initial encounter for closed fracture: Secondary | ICD-10-CM

## 2014-02-10 LAB — COMPREHENSIVE METABOLIC PANEL
ALBUMIN: 3.1 g/dL — AB (ref 3.5–5.2)
ALK PHOS: 45 U/L (ref 39–117)
ALT: 75 U/L — AB (ref 0–53)
AST: 100 U/L — ABNORMAL HIGH (ref 0–37)
Anion gap: 9 (ref 5–15)
BUN: 16 mg/dL (ref 6–23)
CO2: 29 mEq/L (ref 19–32)
Calcium: 8.2 mg/dL — ABNORMAL LOW (ref 8.4–10.5)
Chloride: 102 mEq/L (ref 96–112)
Creatinine, Ser: 0.78 mg/dL (ref 0.50–1.35)
GFR calc Af Amer: >90 mL/min (ref >90)
GFR calc non Af Amer: >90 mL/min (ref >90)
Glucose, Bld: 111 mg/dL — ABNORMAL HIGH (ref 70–99)
POTASSIUM: 3.7 meq/L (ref 3.7–5.3)
Sodium: 140 mEq/L (ref 137–147)
TOTAL PROTEIN: 6.1 g/dL (ref 6.0–8.3)
Total Bilirubin: 1 mg/dL (ref 0.3–1.2)

## 2014-02-10 LAB — CBC
HCT: 26.9 % — ABNORMAL LOW (ref 39.0–52.0)
HEMOGLOBIN: 9.3 g/dL — AB (ref 13.0–17.0)
MCH: 32.5 pg (ref 26.0–34.0)
MCHC: 34.6 g/dL (ref 30.0–36.0)
MCV: 94.1 fL (ref 78.0–100.0)
Platelets: 160 10*3/uL (ref 150–400)
RBC: 2.86 MIL/uL — ABNORMAL LOW (ref 4.22–5.81)
RDW: 13.4 % (ref 11.5–15.5)
WBC: 13.8 10*3/uL — ABNORMAL HIGH (ref 4.0–10.5)

## 2014-02-10 MED ORDER — OXYCODONE-ACETAMINOPHEN 5-325 MG PO TABS
1.0000 | ORAL_TABLET | Freq: Four times a day (QID) | ORAL | Status: DC | PRN
  Administered 2014-02-10 – 2014-02-11 (×4): 2 via ORAL
  Filled 2014-02-10 (×4): qty 2

## 2014-02-10 MED ORDER — COUMADIN BOOK
Freq: Once | Status: AC
  Administered 2014-02-10: 16:00:00
  Filled 2014-02-10: qty 1

## 2014-02-10 MED ORDER — METHOCARBAMOL 500 MG PO TABS: 500.0000 mg | ORAL_TABLET | Freq: Four times a day (QID) | ORAL | Status: DC | PRN

## 2014-02-10 MED ORDER — BISACODYL 10 MG RE SUPP: 10.0000 mg | Freq: Every day | RECTAL | Status: DC | PRN

## 2014-02-10 MED ORDER — ENOXAPARIN SODIUM 40 MG/0.4ML ~~LOC~~ SOLN: 40.0000 mg | SUBCUTANEOUS | Status: DC

## 2014-02-10 MED ORDER — WARFARIN VIDEO
Freq: Once | Status: AC
  Administered 2014-02-10: 16:00:00

## 2014-02-10 MED ORDER — METHOCARBAMOL 1000 MG/10ML IJ SOLN
500.0000 mg | Freq: Four times a day (QID) | INTRAVENOUS | Status: DC
  Filled 2014-02-10 (×6): qty 10

## 2014-02-10 MED ORDER — METHOCARBAMOL 500 MG PO TABS
1000.0000 mg | ORAL_TABLET | Freq: Four times a day (QID) | ORAL | Status: DC
  Administered 2014-02-10 – 2014-02-11 (×4): 1000 mg via ORAL
  Filled 2014-02-10 (×8): qty 2

## 2014-02-10 MED ORDER — DSS 100 MG PO CAPS: 100.0000 mg | ORAL_CAPSULE | Freq: Two times a day (BID) | ORAL | Status: DC

## 2014-02-10 MED ORDER — WARFARIN SODIUM 10 MG PO TABS
10.0000 mg | ORAL_TABLET | Freq: Once | ORAL | Status: AC
  Administered 2014-02-10: 10 mg via ORAL
  Filled 2014-02-10: qty 1

## 2014-02-10 MED ORDER — OXYCODONE-ACETAMINOPHEN 5-325 MG PO TABS: 1.0000 | ORAL_TABLET | Freq: Four times a day (QID) | ORAL | Status: DC | PRN

## 2014-02-10 MED ORDER — THIAMINE HCL 100 MG PO TABS: 100.0000 mg | ORAL_TABLET | Freq: Every day | ORAL | Status: DC

## 2014-02-10 MED ORDER — ONDANSETRON HCL 4 MG PO TABS: 4.0000 mg | ORAL_TABLET | Freq: Four times a day (QID) | ORAL | Status: DC | PRN

## 2014-02-10 MED ORDER — WARFARIN - PHARMACIST DOSING INPATIENT
Freq: Every day | Status: DC
  Administered 2014-02-11: 18:00:00

## 2014-02-10 MED ORDER — OXYCODONE HCL 5 MG PO TABS
5.0000 mg | ORAL_TABLET | Freq: Four times a day (QID) | ORAL | Status: DC | PRN
  Administered 2014-02-10 – 2014-02-11 (×3): 10 mg via ORAL
  Administered 2014-02-11: 5 mg via ORAL
  Filled 2014-02-10 (×3): qty 2

## 2014-02-10 MED ORDER — OXYCODONE HCL 5 MG PO TABS
5.0000 mg | ORAL_TABLET | ORAL | Status: DC | PRN
  Administered 2014-02-10: 5 mg via ORAL
  Administered 2014-02-11: 10 mg via ORAL
  Filled 2014-02-10: qty 1
  Filled 2014-02-10 (×2): qty 2

## 2014-02-10 NOTE — Progress Notes (Signed)
Trauma Service Note  Subjective: Getting XRT of hip today.  Sitting up okay.  Objective: Vital signs in last 24 hours: Temp:  [98.4 F (36.9 C)-99.3 F (37.4 C)] 98.9 F (37.2 C) (08/24 0436) Pulse Rate:  [97-111] 100 (08/24 0436) Resp:  [18-21] 21 (08/24 0436) BP: (102-122)/(64-71) 122/64 mmHg (08/24 0436) SpO2:  [94 %-98 %] 98 % (08/24 0436) Last BM Date: 02/10/14  Intake/Output from previous day: 08/23 0701 - 08/24 0700 In: 240 [P.O.:240] Out: 500 [Urine:500] Intake/Output this shift:    General: No acute distress.  Complins of left chest wall pain.  Lungs: Clear  Abd: Benign, good bowel sounds, excellent bowel movements.  Extremities: No changes  Neuro: Intact  Lab Results: CBC   Recent Labs  02/09/14 0345 02/10/14 0424  WBC 17.4* 13.8*  HGB 9.7* 9.3*  HCT 29.2* 26.9*  PLT 173 160   BMET  Recent Labs  02/09/14 0345 02/10/14 0424  NA 138 140  K 4.1 3.7  CL 98 102  CO2 29 29  GLUCOSE 121* 111*  BUN 15 16  CREATININE 0.71 0.78  CALCIUM 8.4 8.2*   PT/INR No results found for this basename: LABPROT, INR,  in the last 72 hours ABG No results found for this basename: PHART, PCO2, PO2, HCO3,  in the last 72 hours  Studies/Results: No results found.  Anti-infectives: Anti-infectives   Start     Dose/Rate Route Frequency Ordered Stop   02/07/14 2300  ceFAZolin (ANCEF) IVPB 1 g/50 mL premix     1 g 100 mL/hr over 30 Minutes Intravenous Every 6 hours 02/07/14 2126 02/08/14 1223   02/07/14 1800  [MAR Hold]  ceFAZolin (ANCEF) 3 g in dextrose 5 % 50 mL IVPB     (On MAR Hold since 02/07/14 1256)   3 g 160 mL/hr over 30 Minutes Intravenous  Once 02/07/14 1000 02/07/14 1500   02/07/14 1357  ceFAZolin (ANCEF) 2-3 GM-% IVPB SOLR    Comments:  O'Laughlin, Karen   : cabinet override      02/07/14 1357 02/08/14 0214      Assessment/Plan: s/p Procedure(s): OPEN REDUCTION INTERNAL FIXATION (ORIF) ACETABULAR FRACTURE CANNULATED HIP PINNING COntinue  heart healthy diet. We will sign off.  LOS: 4 days   Kathryne Eriksson. Dahlia Bailiff, MD, FACS [phone] Trauma Surgeon 02/10/2014
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
## 2014-02-10 NOTE — Progress Notes (Signed)
I have seen and examined the patient. I agree with the findings above.  [name], MD 02/10/2014 4:05 PM
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
## 2014-02-10 NOTE — Progress Notes (Signed)
[facility] Therapy Treatment Record Phone [phone]   Radiation Therapy was administered to Joseph [name] on: 02/10/2014  6:29 PM and was treatment # 1 out of a planned course of 1 treatments.

## 2014-02-10 NOTE — Progress Notes (Signed)
Simulation note: The patient was placed on the [facility] ([facility]) table. A field was set up along his left hip/acetabulum to encompass the desired treatment volume he is setup to AP and PA fields to deliver 700 cGy with 15 MV photons. Dosimetry calculation is confirmed.
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
## 2014-02-10 NOTE — Progress Notes (Signed)
[facility] End of Treatment Note  Name:Joseph [name]  Date: 02/10/2014 ZSM:[id number] DOB:[birth year]   Status:inpatient    CC: Gar Ponto, MD  Dr. Altamese Bailey  REFERRING PHYSICIAN: Dr. Altamese [place]     DIAGNOSIS:  Left acetabular fracture  INDICATION FOR TREATMENT: Prevention of heterotopic ossification   TREATMENT DATE: 02/10/2014                          SITE/DOSE:   Left hip 700 cGy in a single fraction                         BEAMS/ENERGY: AP and PA fields, 15 MV photons                  NARRATIVE: The patient tolerated treatment well without incident.                           PLAN: Followup through Dr. Altamese [place]

## 2014-02-10 NOTE — Progress Notes (Signed)
UR completed Cassady Stanczak K. Latronda Spink, RN, BSN, [place], CCM  02/10/2014 4:47 PM
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
## 2014-02-10 NOTE — Progress Notes (Signed)
Checked with patient regarding home CPAP needs. None expressed at this time. Patient able to place CPAP on himself.
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
## 2014-02-10 NOTE — Progress Notes (Signed)
I have spoken with pt's employer and this is a Workers compensation case. Adjuster is Margarita Grizzle case # [id number]. Phone [phone] fax [phone]. I have discussed case with adjuster and faxed clinicals for their review. I have notified the preservice center, Margie Billet, of change in payor at [phone]. [phone]

## 2014-02-10 NOTE — Progress Notes (Signed)
Weekly Management Note:  Site: Left hip/acetabulum Current Dose:  700  cGy Projected Dose: 700  cGy  Narrative: The patient is seen today for routine under treatment assessment. CBCT/MVCT images/port films were reviewed. The chart was reviewed.   His treatment was tolerated without complications.  Physical Examination: There were no vitals filed for this visit..  Weight:  . No change.  Impression: Radiation therapy well tolerated.  Plan: Followup through Dr. Marcelino Scot.

## 2014-02-11 ENCOUNTER — Encounter (HOSPITAL_COMMUNITY): Payer: Self-pay | Admitting: Orthopedic Surgery

## 2014-02-11 ENCOUNTER — Inpatient Hospital Stay (HOSPITAL_COMMUNITY): Payer: Worker's Compensation

## 2014-02-11 ENCOUNTER — Inpatient Hospital Stay (HOSPITAL_COMMUNITY)
Admission: RE | Admit: 2014-02-11 | Discharge: 2014-02-21 | DRG: 945 | Disposition: A | Discharge status: Home Health | Payer: Worker's Compensation | Source: Intra-hospital | Attending: Physical Medicine & Rehabilitation | Admitting: Physical Medicine & Rehabilitation

## 2014-02-11 DIAGNOSIS — M25579 Pain in unspecified ankle and joints of unspecified foot: Secondary | ICD-10-CM

## 2014-02-11 DIAGNOSIS — E039 Hypothyroidism, unspecified: Secondary | ICD-10-CM

## 2014-02-11 DIAGNOSIS — S72009B Fracture of unspecified part of neck of unspecified femur, initial encounter for open fracture type I or II: Secondary | ICD-10-CM

## 2014-02-11 DIAGNOSIS — I2510 Atherosclerotic heart disease of native coronary artery without angina pectoris: Secondary | ICD-10-CM

## 2014-02-11 DIAGNOSIS — S32409A Unspecified fracture of unspecified acetabulum, initial encounter for closed fracture: Secondary | ICD-10-CM

## 2014-02-11 DIAGNOSIS — I10 Essential (primary) hypertension: Secondary | ICD-10-CM

## 2014-02-11 DIAGNOSIS — G8929 Other chronic pain: Secondary | ICD-10-CM

## 2014-02-11 DIAGNOSIS — S72033A Displaced midcervical fracture of unspecified femur, initial encounter for closed fracture: Secondary | ICD-10-CM

## 2014-02-11 DIAGNOSIS — K5900 Constipation, unspecified: Secondary | ICD-10-CM

## 2014-02-11 DIAGNOSIS — Z7902 Long term (current) use of antithrombotics/antiplatelets: Secondary | ICD-10-CM

## 2014-02-11 DIAGNOSIS — S72002D Fracture of unspecified part of neck of left femur, subsequent encounter for closed fracture with routine healing: Secondary | ICD-10-CM

## 2014-02-11 DIAGNOSIS — G4733 Obstructive sleep apnea (adult) (pediatric): Secondary | ICD-10-CM

## 2014-02-11 DIAGNOSIS — M545 Low back pain, unspecified: Secondary | ICD-10-CM

## 2014-02-11 DIAGNOSIS — Z79899 Other long term (current) drug therapy: Secondary | ICD-10-CM

## 2014-02-11 DIAGNOSIS — S329XXA Fracture of unspecified parts of lumbosacral spine and pelvis, initial encounter for closed fracture: Secondary | ICD-10-CM

## 2014-02-11 DIAGNOSIS — Z87442 Personal history of urinary calculi: Secondary | ICD-10-CM

## 2014-02-11 DIAGNOSIS — Z87891 Personal history of nicotine dependence: Secondary | ICD-10-CM

## 2014-02-11 DIAGNOSIS — Y92410 Unspecified street and highway as the place of occurrence of the external cause: Secondary | ICD-10-CM

## 2014-02-11 DIAGNOSIS — Z9861 Coronary angioplasty status: Secondary | ICD-10-CM

## 2014-02-11 DIAGNOSIS — I25119 Atherosclerotic heart disease of native coronary artery with unspecified angina pectoris: Secondary | ICD-10-CM | POA: Diagnosis present

## 2014-02-11 DIAGNOSIS — E782 Mixed hyperlipidemia: Secondary | ICD-10-CM

## 2014-02-11 DIAGNOSIS — D62 Acute posthemorrhagic anemia: Secondary | ICD-10-CM | POA: Diagnosis present

## 2014-02-11 DIAGNOSIS — E785 Hyperlipidemia, unspecified: Secondary | ICD-10-CM

## 2014-02-11 DIAGNOSIS — G4730 Sleep apnea, unspecified: Secondary | ICD-10-CM

## 2014-02-11 DIAGNOSIS — Z8249 Family history of ischemic heart disease and other diseases of the circulatory system: Secondary | ICD-10-CM

## 2014-02-11 DIAGNOSIS — G4700 Insomnia, unspecified: Secondary | ICD-10-CM

## 2014-02-11 DIAGNOSIS — Z5189 Encounter for other specified aftercare: Principal | ICD-10-CM

## 2014-02-11 DIAGNOSIS — Z841 Family history of disorders of kidney and ureter: Secondary | ICD-10-CM

## 2014-02-11 DIAGNOSIS — R011 Cardiac murmur, unspecified: Secondary | ICD-10-CM

## 2014-02-11 DIAGNOSIS — Z887 Allergy status to serum and vaccine status: Secondary | ICD-10-CM

## 2014-02-11 DIAGNOSIS — Z8051 Family history of malignant neoplasm of kidney: Secondary | ICD-10-CM

## 2014-02-11 DIAGNOSIS — Z9889 Other specified postprocedural states: Secondary | ICD-10-CM

## 2014-02-11 DIAGNOSIS — D72829 Elevated white blood cell count, unspecified: Secondary | ICD-10-CM

## 2014-02-11 DIAGNOSIS — S72059A Unspecified fracture of head of unspecified femur, initial encounter for closed fracture: Secondary | ICD-10-CM | POA: Diagnosis present

## 2014-02-11 DIAGNOSIS — Z880 Allergy status to penicillin: Secondary | ICD-10-CM

## 2014-02-11 DIAGNOSIS — Z833 Family history of diabetes mellitus: Secondary | ICD-10-CM

## 2014-02-11 LAB — CBC
HEMATOCRIT: 25.8 % — AB (ref 39.0–52.0)
Hemoglobin: 8.9 g/dL — ABNORMAL LOW (ref 13.0–17.0)
MCH: 32.7 pg (ref 26.0–34.0)
MCHC: 34.5 g/dL (ref 30.0–36.0)
MCV: 94.9 fL (ref 78.0–100.0)
Platelets: 182 10*3/uL (ref 150–400)
RBC: 2.72 MIL/uL — AB (ref 4.22–5.81)
RDW: 13.7 % (ref 11.5–15.5)
WBC: 13.1 10*3/uL — ABNORMAL HIGH (ref 4.0–10.5)

## 2014-02-11 LAB — PROTIME-INR
INR: 1.19 (ref 0.00–1.49)
Prothrombin Time: 15.1 seconds (ref 11.6–15.2)

## 2014-02-11 MED ORDER — LEVOTHYROXINE SODIUM 100 MCG PO TABS
100.0000 ug | ORAL_TABLET | Freq: Every day | ORAL | Status: DC
  Administered 2014-02-12 – 2014-02-21 (×10): 100 ug via ORAL
  Filled 2014-02-11 (×11): qty 1

## 2014-02-11 MED ORDER — ENOXAPARIN SODIUM 40 MG/0.4ML ~~LOC~~ SOLN
40.0000 mg | SUBCUTANEOUS | Status: DC
  Administered 2014-02-12 – 2014-02-15 (×4): 40 mg via SUBCUTANEOUS
  Filled 2014-02-11 (×5): qty 0.4

## 2014-02-11 MED ORDER — ADULT MULTIVITAMIN W/MINERALS CH
1.0000 | ORAL_TABLET | Freq: Every day | ORAL | Status: DC
  Administered 2014-02-12 – 2014-02-21 (×10): 1 via ORAL
  Filled 2014-02-11 (×11): qty 1

## 2014-02-11 MED ORDER — NITROGLYCERIN 0.4 MG SL SUBL: 0.4000 mg | SUBLINGUAL_TABLET | SUBLINGUAL | Status: DC | PRN

## 2014-02-11 MED ORDER — ALUM & MAG HYDROXIDE-SIMETH 200-200-20 MG/5ML PO SUSP: 30.0000 mL | ORAL | Status: DC | PRN

## 2014-02-11 MED ORDER — WARFARIN SODIUM 10 MG PO TABS: 10.0000 mg | ORAL_TABLET | Freq: Once | ORAL | Status: DC

## 2014-02-11 MED ORDER — METHOCARBAMOL 500 MG PO TABS: 500.0000 mg | ORAL_TABLET | Freq: Four times a day (QID) | ORAL | Status: DC | PRN

## 2014-02-11 MED ORDER — FLEET ENEMA 7-19 GM/118ML RE ENEM: 1.0000 | ENEMA | Freq: Once | RECTAL | Status: AC | PRN

## 2014-02-11 MED ORDER — DIPHENHYDRAMINE HCL 12.5 MG/5ML PO ELIX
12.5000 mg | ORAL_SOLUTION | Freq: Four times a day (QID) | ORAL | Status: DC | PRN
  Administered 2014-02-18 – 2014-02-20 (×6): 25 mg via ORAL
  Filled 2014-02-11 (×6): qty 10

## 2014-02-11 MED ORDER — OXYCODONE HCL 5 MG PO TABS
5.0000 mg | ORAL_TABLET | ORAL | Status: DC | PRN
  Administered 2014-02-11 – 2014-02-21 (×55): 10 mg via ORAL
  Filled 2014-02-11 (×56): qty 2

## 2014-02-11 MED ORDER — SIMVASTATIN 5 MG PO TABS
5.0000 mg | ORAL_TABLET | Freq: Every day | ORAL | Status: DC
  Administered 2014-02-12 – 2014-02-20 (×9): 5 mg via ORAL
  Filled 2014-02-11 (×10): qty 1

## 2014-02-11 MED ORDER — POLYSACCHARIDE IRON COMPLEX 150 MG PO CAPS
150.0000 mg | ORAL_CAPSULE | Freq: Two times a day (BID) | ORAL | Status: DC
  Administered 2014-02-12 – 2014-02-21 (×19): 150 mg via ORAL
  Filled 2014-02-11 (×20): qty 1

## 2014-02-11 MED ORDER — TRAMADOL HCL 50 MG PO TABS
50.0000 mg | ORAL_TABLET | Freq: Four times a day (QID) | ORAL | Status: DC | PRN
  Administered 2014-02-16 – 2014-02-17 (×2): 50 mg via ORAL
  Filled 2014-02-11 (×2): qty 1

## 2014-02-11 MED ORDER — ONDANSETRON HCL 4 MG PO TABS: 4.0000 mg | ORAL_TABLET | Freq: Four times a day (QID) | ORAL | Status: DC | PRN

## 2014-02-11 MED ORDER — POLYETHYLENE GLYCOL 3350 17 G PO PACK
17.0000 g | PACK | Freq: Two times a day (BID) | ORAL | Status: DC
  Administered 2014-02-12 – 2014-02-21 (×16): 17 g via ORAL
  Filled 2014-02-11 (×23): qty 1

## 2014-02-11 MED ORDER — POLYETHYLENE GLYCOL 3350 17 G PO PACK
17.0000 g | PACK | Freq: Two times a day (BID) | ORAL | Status: DC
Start: 2014-02-11 — End: 2014-02-12
  Administered 2014-02-11 – 2014-02-12 (×2): 17 g via ORAL
  Filled 2014-02-11 (×4): qty 1

## 2014-02-11 MED ORDER — BISACODYL 10 MG RE SUPP: 10.0000 mg | Freq: Every day | RECTAL | Status: DC | PRN

## 2014-02-11 MED ORDER — VITAMIN B-1 100 MG PO TABS
100.0000 mg | ORAL_TABLET | Freq: Every day | ORAL | Status: DC
  Administered 2014-02-12 – 2014-02-21 (×10): 100 mg via ORAL
  Filled 2014-02-11 (×11): qty 1

## 2014-02-11 MED ORDER — METOPROLOL SUCCINATE ER 25 MG PO TB24
25.0000 mg | ORAL_TABLET | Freq: Every day | ORAL | Status: DC
  Administered 2014-02-12 – 2014-02-21 (×10): 25 mg via ORAL
  Filled 2014-02-11 (×11): qty 1

## 2014-02-11 MED ORDER — FLEET ENEMA 7-19 GM/118ML RE ENEM
1.0000 | ENEMA | Freq: Once | RECTAL | Status: DC
  Filled 2014-02-11: qty 1

## 2014-02-11 MED ORDER — POLYETHYLENE GLYCOL 3350 17 G PO PACK: 17.0000 g | PACK | Freq: Two times a day (BID) | ORAL | Status: DC

## 2014-02-11 MED ORDER — ASPIRIN EC 81 MG PO TBEC: 81.0000 mg | DELAYED_RELEASE_TABLET | Freq: Every day | ORAL | Status: DC

## 2014-02-11 MED ORDER — LISINOPRIL 10 MG PO TABS
10.0000 mg | ORAL_TABLET | Freq: Every day | ORAL | Status: DC
  Administered 2014-02-12 – 2014-02-21 (×10): 10 mg via ORAL
  Filled 2014-02-11 (×11): qty 1

## 2014-02-11 MED ORDER — FINASTERIDE 5 MG PO TABS
5.0000 mg | ORAL_TABLET | Freq: Every day | ORAL | Status: DC
  Filled 2014-02-11 (×2): qty 1

## 2014-02-11 MED ORDER — ZOLPIDEM TARTRATE 5 MG PO TABS: 5.0000 mg | ORAL_TABLET | Freq: Every evening | ORAL | Status: DC | PRN

## 2014-02-11 MED ORDER — POLYETHYLENE GLYCOL 3350 17 G PO PACK
17.0000 g | PACK | Freq: Two times a day (BID) | ORAL | Status: DC
  Administered 2014-02-11: 17 g via ORAL
  Filled 2014-02-11 (×2): qty 1

## 2014-02-11 MED ORDER — TICAGRELOR 90 MG PO TABS
90.0000 mg | ORAL_TABLET | Freq: Two times a day (BID) | ORAL | Status: DC
  Administered 2014-02-12 – 2014-02-21 (×19): 90 mg via ORAL
  Filled 2014-02-11 (×22): qty 1

## 2014-02-11 MED ORDER — OXYCODONE HCL 5 MG PO TABS
5.0000 mg | ORAL_TABLET | ORAL | Status: DC | PRN
  Filled 2014-02-11: qty 2

## 2014-02-11 MED ORDER — WARFARIN SODIUM 10 MG PO TABS
10.0000 mg | ORAL_TABLET | Freq: Once | ORAL | Status: AC
  Administered 2014-02-11: 10 mg via ORAL
  Filled 2014-02-11: qty 1

## 2014-02-11 MED ORDER — ACETAMINOPHEN 325 MG PO TABS: 325.0000 mg | ORAL_TABLET | ORAL | Status: DC | PRN

## 2014-02-11 MED ORDER — ONDANSETRON HCL 4 MG/2ML IJ SOLN: 4.0000 mg | Freq: Four times a day (QID) | INTRAMUSCULAR | Status: DC | PRN

## 2014-02-11 MED ORDER — FOLIC ACID 1 MG PO TABS
1.0000 mg | ORAL_TABLET | Freq: Every day | ORAL | Status: DC
  Administered 2014-02-12 – 2014-02-21 (×10): 1 mg via ORAL
  Filled 2014-02-11 (×11): qty 1

## 2014-02-11 MED ORDER — METHOCARBAMOL 500 MG PO TABS
500.0000 mg | ORAL_TABLET | Freq: Four times a day (QID) | ORAL | Status: DC | PRN
  Administered 2014-02-11 – 2014-02-18 (×9): 500 mg via ORAL
  Filled 2014-02-11 (×9): qty 1

## 2014-02-11 MED ORDER — GUAIFENESIN-DM 100-10 MG/5ML PO SYRP: 5.0000 mL | ORAL_SOLUTION | Freq: Four times a day (QID) | ORAL | Status: DC | PRN
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
## 2014-02-11 NOTE — Progress Notes (Signed)
Patient discharged to inpatient rehab room 6. Al belongings were taken over to rehab unit. Report was given to the nurse that was getting that patient.
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
## 2014-02-11 NOTE — Progress Notes (Signed)
Patient has home CPAP machine and home mask. Patient and wife will place CPAP on when ready.
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
## 2014-02-11 NOTE — Progress Notes (Signed)
I have workers Economist and pt and wife are agreeable to admission to inpt rehab today. I will arrange. [phone]

## 2014-02-11 NOTE — Progress Notes (Signed)
I met with pt , his wife, and workers Chartered certified accountant at bedside to sign release of information form and provide copies of his medical record for their review. I await approval today to admit pt to inpt rehab. [phone]

## 2014-02-12 ENCOUNTER — Inpatient Hospital Stay (HOSPITAL_COMMUNITY): Payer: Self-pay | Admitting: Occupational Therapy

## 2014-02-12 ENCOUNTER — Inpatient Hospital Stay (HOSPITAL_COMMUNITY): Payer: Worker's Compensation

## 2014-02-12 DIAGNOSIS — S72009A Fracture of unspecified part of neck of unspecified femur, initial encounter for closed fracture: Secondary | ICD-10-CM

## 2014-02-12 LAB — CBC WITH DIFFERENTIAL/PLATELET
Basophils Absolute: 0 10*3/uL (ref 0.0–0.1)
Basophils Relative: 0 % (ref 0–1)
EOS ABS: 0.1 10*3/uL (ref 0.0–0.7)
Eosinophils Relative: 1 % (ref 0–5)
HCT: 26.2 % — ABNORMAL LOW (ref 39.0–52.0)
HEMOGLOBIN: 8.8 g/dL — AB (ref 13.0–17.0)
LYMPHS ABS: 1.9 10*3/uL (ref 0.7–4.0)
Lymphocytes Relative: 17 % (ref 12–46)
MCH: 32.1 pg (ref 26.0–34.0)
MCHC: 33.6 g/dL (ref 30.0–36.0)
MCV: 95.6 fL (ref 78.0–100.0)
Monocytes Absolute: 0.8 10*3/uL (ref 0.1–1.0)
Monocytes Relative: 7 % (ref 3–12)
NEUTROS PCT: 75 % (ref 43–77)
Neutro Abs: 8.5 10*3/uL — ABNORMAL HIGH (ref 1.7–7.7)
Platelets: 201 10*3/uL (ref 150–400)
RBC: 2.74 MIL/uL — AB (ref 4.22–5.81)
RDW: 14 % (ref 11.5–15.5)
WBC: 11.3 10*3/uL — ABNORMAL HIGH (ref 4.0–10.5)

## 2014-02-12 LAB — COMPREHENSIVE METABOLIC PANEL
ALT: 73 U/L — ABNORMAL HIGH (ref 0–53)
AST: 64 U/L — ABNORMAL HIGH (ref 0–37)
Albumin: 2.9 g/dL — ABNORMAL LOW (ref 3.5–5.2)
Alkaline Phosphatase: 49 U/L (ref 39–117)
Anion gap: 11 (ref 5–15)
BUN: 15 mg/dL (ref 6–23)
CO2: 28 mEq/L (ref 19–32)
Calcium: 8.1 mg/dL — ABNORMAL LOW (ref 8.4–10.5)
Chloride: 100 mEq/L (ref 96–112)
Creatinine, Ser: 0.78 mg/dL (ref 0.50–1.35)
GFR calc Af Amer: >90 mL/min (ref >90)
GFR calc non Af Amer: >90 mL/min (ref >90)
Glucose, Bld: 126 mg/dL — ABNORMAL HIGH (ref 70–99)
Potassium: 4 mEq/L (ref 3.7–5.3)
Sodium: 139 mEq/L (ref 137–147)
Total Bilirubin: 0.9 mg/dL (ref 0.3–1.2)
Total Protein: 6 g/dL (ref 6.0–8.3)

## 2014-02-12 LAB — PROTIME-INR
INR: 1.82 — ABNORMAL HIGH (ref 0.00–1.49)
PROTHROMBIN TIME: 21.1 s — AB (ref 11.6–15.2)

## 2014-02-12 LAB — OCCULT BLOOD X 1 CARD TO LAB, STOOL: Fecal Occult Bld: NEGATIVE

## 2014-02-12 MED ORDER — OXYCODONE HCL ER 10 MG PO T12A
10.0000 mg | EXTENDED_RELEASE_TABLET | Freq: Two times a day (BID) | ORAL | Status: DC
  Administered 2014-02-12 – 2014-02-13 (×4): 10 mg via ORAL
  Filled 2014-02-12 (×4): qty 1

## 2014-02-12 MED ORDER — WARFARIN SODIUM 5 MG PO TABS
5.0000 mg | ORAL_TABLET | Freq: Once | ORAL | Status: AC
  Administered 2014-02-12: 5 mg via ORAL
  Filled 2014-02-12: qty 1

## 2014-02-12 MED ORDER — WARFARIN - PHARMACIST DOSING INPATIENT
Freq: Every day | Status: DC
  Administered 2014-02-17: 18:00:00

## 2014-02-12 MED ORDER — FINASTERIDE 5 MG PO TABS
5.0000 mg | ORAL_TABLET | Freq: Every day | ORAL | Status: DC
  Administered 2014-02-12 – 2014-02-20 (×10): 5 mg via ORAL
  Filled 2014-02-12 (×11): qty 1

## 2014-02-12 MED ORDER — POLYETHYLENE GLYCOL 3350 17 G PO PACK
17.0000 g | PACK | Freq: Once | ORAL | Status: AC
  Administered 2014-02-12: 17 g via ORAL
  Filled 2014-02-12: qty 1

## 2014-02-12 MED FILL — Methocarbamol Inj 1000 MG/10ML: INTRAMUSCULAR | Qty: 5 | Status: AC

## 2014-02-12 NOTE — Progress Notes (Signed)
Pt's wife had brought in his home unit, and stated that they were not in need of help with CPAP.
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
## 2014-02-12 NOTE — Progress Notes (Signed)
ANTICOAGULATION CONSULT NOTE - Follow Up Consult  Pharmacy Consult for coumadin Indication: VTE prophylaxis  Allergies  Allergen Reactions  . Penicillins Other (See Comments)    Unknown  . Tetanus Toxoid Other (See Comments)    REACTION: unknown    Patient Measurements:   Heparin Dosing Weight:   Vital Signs: Temp: 98.7 F (37.1 C) (08/26 0557) Temp src: Oral (08/26 0557) BP: 126/98 mmHg (08/26 0557) Pulse Rate: 87 (08/26 0557)  Labs:  Recent Labs  02/10/14 0424 02/11/14 0452 02/12/14 0420  HGB 9.3* 8.9* 8.8*  HCT 26.9* 25.8* 26.2*  PLT 160 182 201  LABPROT  --  15.1 21.1*  INR  --  1.19 1.82*  CREATININE 0.78  --  0.78    The CrCl is unknown because both a height and weight (above a minimum accepted value) are required for this calculation.   Medications:  Scheduled:  . enoxaparin (LOVENOX) injection  40 mg Subcutaneous Q24H  . finasteride  5 mg Oral Daily  . folic acid  1 mg Oral Daily  . iron polysaccharides  150 mg Oral BID AC  . levothyroxine  100 mcg Oral QAC breakfast  . lisinopril  10 mg Oral Daily  . metoprolol succinate  25 mg Oral Daily  . multivitamin with minerals  1 tablet Oral Daily  . OxyCODONE  10 mg Oral Q12H  . polyethylene glycol  17 g Oral BID  . polyethylene glycol  17 g Oral BID  . simvastatin  5 mg Oral q1800  . sodium phosphate  1 enema Rectal Once  . thiamine  100 mg Oral Daily  . ticagrelor  90 mg Oral BID   Infusions:    Assessment: 48 yo male s/p ortho surgery is currently on subtherapeutic coumadin but INR jumped to 182 from 1.19.  Patient is also on lovenox.  Goal of Therapy:  INR 2-3 Monitor platelets by anticoagulation protocol: Yes   Plan:  - Warfarin 5 mg PO tonight  - INR daily, watch H/H - Monitor for s/sx of bleeding  - Discontinue Lovenox when INR >2   Kimmie Doren, Tsz-Yin 02/12/2014,8:12 AM

## 2014-02-12 NOTE — Progress Notes (Signed)
Patient has no BM today.  Patient offered suppository and is currently refusing.  Patient educated about the importance of regular bowel function and verbalizes understanding.  Will continue to monitor.

## 2014-02-12 NOTE — Discharge Instructions (Addendum)
Inpatient Rehab Discharge Instructions  Joseph [name] Discharge date and time: 02/20/14   Activities/Precautions/ Functional Status: Activity: activity as tolerated--Touch down weight on Left leg.  Diet: cardiac diet Wound Care:  Wash with soap and water. Pat dry. Call Dr. Marcelino Scot if you notice any redness, drainage or develop  fevers or chills  Functional status:  ___ No restrictions     ___ Walk up steps independently ___ 24/7 supervision/assistance   ___ Walk up steps with assistance _X__ Intermittent supervision/assistance  ___ Bathe/dress independently ___ Walk with walker     ___ Bathe/dress with assistance ___ Walk Independently    ___ Shower independently ___ Walk with assistance    ___ Shower with assistance _X__ No alcohol     ___ Return to work/school ________  Special Instructions: 1. Keep feet elevated when sitting 2. WEAR support stockings or Ace Wraps on both legs for next 3-4 weeks to help with swelling.     COMMUNITY REFERRALS UPON DISCHARGE:    Home Health:   PT & RN  [place] [facility] Phone:  Medical Equipment/Items Ordered:WHEELCHAIR, , WIDE ROLLING WALKER, ELONGATED [place], TUB [facility]  Agency/Supplier:[employer]   [phone]    My questions have been answered and I understand these instructions. I will adhere to these goals and the provided educational materials after my discharge from the hospital.  Patient/Caregiver Signature _______________________________ Date __________  Clinician Signature _______________________________________ Date __________  Please bring this form and your medication list with you to all your follow-up doctor's appointments.    Information on my medicine - Coumadin   (Warfarin)  This medication education was reviewed with me or my healthcare representative as part of my discharge preparation.  The pharmacist that spoke with me during my hospital stay was:  Steffanie Dunn, PharmD  Why was Coumadin prescribed  for you? Coumadin was prescribed for you because you have a blood clot or a medical condition that can cause an increased risk of forming blood clots. Blood clots can cause serious health problems by blocking the flow of blood to the heart, lung, or brain. Coumadin can prevent harmful blood clots from forming. As a reminder your indication for Coumadin is:   Blood Clot Prevention After Orthopedic Surgery  What test will check on my response to Coumadin? While on Coumadin (warfarin) you will need to have an INR test regularly to ensure that your dose is keeping you in the desired range. The INR (international normalized ratio) number is calculated from the result of the laboratory test called prothrombin time (PT).  If an INR APPOINTMENT HAS NOT ALREADY BEEN MADE FOR YOU please schedule an appointment to have this lab work done by your health care provider within 7 days. Your INR goal is usually a number between:  2 to 3 or your provider may give you a more narrow range like 2-2.5.  Ask your health care provider during an office visit what your goal INR is.  What  do you need to  know  About  COUMADIN? Take Coumadin (warfarin) exactly as prescribed by your healthcare provider about the same time each day.  DO NOT stop taking without talking to the doctor who prescribed the medication.  Stopping without other blood clot prevention medication to take the place of Coumadin may increase your risk of developing a new clot or stroke.  Get refills before you run out.  What do you do if you miss a dose? If you miss a dose, take it as soon  as you remember on the same day then continue your regularly scheduled regimen the next day.  Do not take two doses of Coumadin at the same time.  Important Safety Information A possible side effect of Coumadin (Warfarin) is an increased risk of bleeding. You should call your healthcare provider right away if you experience any of the following:   Bleeding from an injury  or your nose that does not stop.   Unusual colored urine (red or dark brown) or unusual colored stools (red or black).   Unusual bruising for unknown reasons.   A serious fall or if you hit your head (even if there is no bleeding).  Some foods or medicines interact with Coumadin (warfarin) and might alter your response to warfarin. To help avoid this:   Eat a balanced diet, maintaining a consistent amount of Vitamin K.   Notify your provider about major diet changes you plan to make.   Avoid alcohol or limit your intake to 1 drink for women and 2 drinks for men per day. (1 drink is 5 oz. wine, 12 oz. beer, or 1.5 oz. liquor.)  Make sure that ANY health care provider who prescribes medication for you knows that you are taking Coumadin (warfarin).  Also make sure the healthcare provider who is monitoring your Coumadin knows when you have started a new medication including herbals and non-prescription products.  Coumadin (Warfarin)  Major Drug Interactions  Increased Warfarin Effect Decreased Warfarin Effect  Alcohol (large quantities) Antibiotics (esp. Septra/Bactrim, Flagyl, Cipro) Amiodarone (Cordarone) Aspirin (ASA) Cimetidine (Tagamet) Megestrol (Megace) NSAIDs (ibuprofen, naproxen, etc.) Piroxicam (Feldene) Propafenone (Rythmol SR) Propranolol (Inderal) Isoniazid (INH) Posaconazole (Noxafil) Barbiturates (Phenobarbital) Carbamazepine (Tegretol) Chlordiazepoxide (Librium) Cholestyramine (Questran) Griseofulvin Oral Contraceptives Rifampin Sucralfate (Carafate) Vitamin K   Coumadin (Warfarin) Major Herbal Interactions  Increased Warfarin Effect Decreased Warfarin Effect  Garlic Ginseng Ginkgo biloba Coenzyme Q10 Green tea St. Johns wort    Coumadin (Warfarin) FOOD Interactions  Eat a consistent number of servings per week of foods HIGH in Vitamin K (1 serving =  cup)  Collards (cooked, or boiled & drained) Kale (cooked, or boiled & drained) Mustard greens  (cooked, or boiled & drained) Parsley *serving size only =  cup Spinach (cooked, or boiled & drained) Swiss chard (cooked, or boiled & drained) Turnip greens (cooked, or boiled & drained)  Eat a consistent number of servings per week of foods MEDIUM-HIGH in Vitamin K (1 serving = 1 cup)  Asparagus (cooked, or boiled & drained) Broccoli (cooked, boiled & drained, or raw & chopped) Brussel sprouts (cooked, or boiled & drained) *serving size only =  cup Lettuce, raw (green leaf, endive, romaine) Spinach, raw Turnip greens, raw & chopped   These websites have more information on Coumadin (warfarin):  FailFactory.se; VeganReport.com.au;

## 2014-02-12 NOTE — Care Management Note (Signed)
[facility] Individual Statement of Services  Patient Name:  Joseph [name]  Date:  02/12/2014  Welcome to the [facility].  Our goal is to provide you with an individualized program based on your diagnosis and situation, designed to meet your specific needs.  With this comprehensive rehabilitation program, you will be expected to participate in at least 3 hours of rehabilitation therapies Monday-Friday, with modified therapy programming on the weekends.  Your rehabilitation program will include the following services:  Physical Therapy (PT), Occupational Therapy (OT), 24 hour per day rehabilitation nursing, Therapeutic Recreaction (TR), Neuropsychology, Case Management (Social Worker), Rehabilitation Medicine, Nutrition Services and Pharmacy Services  Weekly team conferences will be held on Wednesday to discuss your progress.  Your Social Worker will talk with you frequently to get your input and to update you on team discussions.  Team conferences with you and your family in attendance may also be held.  Expected length of stay: 2 weeks  Overall anticipated outcome: supervision level  Depending on your progress and recovery, your program may change. Your Social Worker will coordinate services and will keep you informed of any changes. Your Social Worker's name and contact numbers are listed  below.  The following services may also be recommended but are not provided by the [facility] will be made to provide these services after discharge if needed.  Arrangements include referral to agencies that provide these services.  Your insurance has been verified to be:  Workers Comp Your primary doctor is:  Gar Ponto  Pertinent information will be shared with your doctor and your insurance  company.  Social Worker:  Ovidio Kin, [place] or ([phone]  Information discussed with and copy given to patient by: Elease Hashimoto, 02/12/2014, 3:19 PM
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
## 2014-02-12 NOTE — Progress Notes (Signed)
Patient information reviewed and entered into eRehab system by Mauriah Mcmillen, RN, CRRN, PPS Coordinator.  Information including medical coding and functional independence measure will be reviewed and updated through discharge.    
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
## 2014-02-13 ENCOUNTER — Inpatient Hospital Stay (HOSPITAL_COMMUNITY): Payer: Worker's Compensation

## 2014-02-13 ENCOUNTER — Encounter (HOSPITAL_COMMUNITY): Payer: Self-pay

## 2014-02-13 ENCOUNTER — Encounter (HOSPITAL_COMMUNITY): Payer: Self-pay | Admitting: Occupational Therapy

## 2014-02-13 DIAGNOSIS — I2510 Atherosclerotic heart disease of native coronary artery without angina pectoris: Secondary | ICD-10-CM

## 2014-02-13 DIAGNOSIS — S72033A Displaced midcervical fracture of unspecified femur, initial encounter for closed fracture: Secondary | ICD-10-CM | POA: Diagnosis not present

## 2014-02-13 DIAGNOSIS — S329XXA Fracture of unspecified parts of lumbosacral spine and pelvis, initial encounter for closed fracture: Secondary | ICD-10-CM | POA: Diagnosis not present

## 2014-02-13 DIAGNOSIS — I10 Essential (primary) hypertension: Secondary | ICD-10-CM

## 2014-02-13 LAB — PROTIME-INR
INR: 1.99 — ABNORMAL HIGH (ref 0.00–1.49)
Prothrombin Time: 22.6 seconds — ABNORMAL HIGH (ref 11.6–15.2)

## 2014-02-13 MED ORDER — WARFARIN SODIUM 5 MG PO TABS
5.0000 mg | ORAL_TABLET | Freq: Once | ORAL | Status: AC
  Administered 2014-02-13: 5 mg via ORAL
  Filled 2014-02-13: qty 1

## 2014-02-13 MED ORDER — SENNOSIDES-DOCUSATE SODIUM 8.6-50 MG PO TABS: 2.0000 | ORAL_TABLET | Freq: Two times a day (BID) | ORAL | Status: DC

## 2014-02-13 NOTE — Progress Notes (Signed)
ANTICOAGULATION CONSULT NOTE - Follow Up Consult  Pharmacy Consult for coumadin Indication: VTE prophylaxis  Allergies  Allergen Reactions  . Penicillins Other (See Comments)    Unknown  . Tetanus Toxoid Other (See Comments)    REACTION: unknown    Patient Measurements: Weight: 292 lb 15.9 oz (132.9 kg) Heparin Dosing Weight:   Vital Signs: Temp: 98.7 F (37.1 C) (08/27 0625) Temp src: Oral (08/27 0625) BP: 110/58 mmHg (08/27 0625) Pulse Rate: 81 (08/27 0625)  Labs:  Recent Labs  02/11/14 0452 02/12/14 0420  HGB 8.9* 8.8*  HCT 25.8* 26.2*  PLT 182 201  LABPROT 15.1 21.1*  INR 1.19 1.82*  CREATININE  --  0.78    The CrCl is unknown because both a height and weight (above a minimum accepted value) are required for this calculation.   Medications:  Scheduled:  . enoxaparin (LOVENOX) injection  40 mg Subcutaneous Q24H  . finasteride  5 mg Oral Daily  . folic acid  1 mg Oral Daily  . iron polysaccharides  150 mg Oral BID AC  . levothyroxine  100 mcg Oral QAC breakfast  . lisinopril  10 mg Oral Daily  . metoprolol succinate  25 mg Oral Daily  . multivitamin with minerals  1 tablet Oral Daily  . OxyCODONE  10 mg Oral Q12H  . polyethylene glycol  17 g Oral BID  . simvastatin  5 mg Oral q1800  . sodium phosphate  1 enema Rectal Once  . thiamine  100 mg Oral Daily  . ticagrelor  90 mg Oral BID  . Warfarin - Pharmacist Dosing Inpatient   Does not apply q1800   Infusions:    Assessment: 48 yo male s/p ortho surgery is currently on slightly subtherapeutic coumadin.  INR is 1.99. Goal of Therapy:  INR 2-3 Monitor platelets by anticoagulation protocol: Yes   Plan:  - Warfarin 5 mg PO tonight  - INR daily, watch H/H - Monitor for s/sx of bleeding  - Discontinue Lovenox when INR >2   Vincent Streater, Tsz-Yin 02/13/2014,8:01 AM

## 2014-02-13 NOTE — Progress Notes (Signed)
Occupational Therapy Session Note  Patient Details  Name: Joseph [name] MRN: [id number] Date of Birth: 1965-10-27  Today's Date: 02/13/2014 OT Individual Time: 8937-3428 OT Individual Time Calculation (min): 72 min   Short Term Goals: Week 1:  OT Short Term Goal 1 (Week 1): Pt will be able to transfer to [facility] with min A w/c to [facility]. OT Short Term Goal 2 (Week 1): Pt will be able to stand with min A to RW to complete LB dressing. OT Short Term Goal 3 (Week 1): Pt will be able to don pants using reacher and pull them over his hips with min A. OT Short Term Goal 4 (Week 1): Pt will be able to bathe LB with min A.  OT Short Term Goal 5 (Week 1): Pt and pt's wife will be able to complete the [facility] transfers with min A to allow her to assist him as needed.  Skilled Therapeutic Interventions/Progress Updates:      Pt seen for BADL retraining of toileting, bathing, and dressing with a focus on functional mobility, maintaining precautions, and use of AE. Pt sitting in w/c at start of session. He completed grooming and UB bathing at sink. Stood with min A to sink to wash perineal area but needed assist with bottom.  Pt leaning too much on sink and will use RW for support next time he completes the task.  Pt sat down and needed long rest break as he was SOB and feeling dizzy. His blood pressure has been running low.  He used reacher to don pants over feet, stood with RW to pull pants over hip. He needed to toilet and performed an excellent transfer with RW to [facility] with min A.  He needed A to cleanse bottom. Provided pt with toilet aid and educated pt and wife on sanitary use of it. Pt used it and stated it helped him a great deal as he was able to cleanse himself. His LE rewrapped with ACE to reduce edema. Pt resting in chair with spouse in room.  Therapy Documentation Precautions:  Precautions Precautions: Posterior Hip;Fall Precaution Booklet Issued: Yes (comment) Precaution Comments: recalled 3/3  precautions Restrictions Weight Bearing Restrictions: Yes RLE Weight Bearing: Weight bearing as tolerated LLE Weight Bearing: Touchdown weight bearing    Vital Signs: Therapy Vitals Temp: 98.7 F (37.1 C) Temp src: Oral Pulse Rate: 81 Resp: 18 BP: 110/58 mmHg Patient Position (if appropriate): Lying Oxygen Therapy SpO2: 100 % O2 Device: None (Room air) Pain: Pain Assessment Pain Assessment: 0-10 Pain Score: 5  Pain Type: Surgical pain Pain Location: Rib cage Pain Descriptors / Indicators: Aching;Sore Pain Frequency: Constant Pain Onset: On-going Premedicated  ADL: ADL ADL Comments: refer to FIM  See FIM for current functional status  Therapy/Group: Individual Therapy  [place] 02/13/2014, 9:23 AM
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
## 2014-02-13 NOTE — Progress Notes (Signed)
Physical Therapy Session Note  Patient Details  Name: Joseph [name] MRN: [id number] Date of Birth: May 12, 1966  Today's Date: 02/13/2014 PT Individual Time: 1300-1400 PT Individual Time Calculation (min): 60 min   Short Term Goals: Week 1:  PT Short Term Goal 1 (Week 1): pt will move supine> sit with assistance of 1 person PT Short Term Goal 2 (Week 1): pt will move sit> supine with supervision, with use of adaptive equipment PRN PT Short Term Goal 3 (Week 1): pt will transfer bed>< w/c with close supervision PT Short Term Goal 4 (Week 1): pt will propel w/c x 150' with supervision including carpet, slight incline PT Short Term Goal 5 (Week 1): pt will perform gait x 50' with min assist  Skilled Therapeutic Interventions/Progress Updates:  Tx 1:  Pain L hip and R ribs 9/10, premedicated.  W/c propulsion x 162' with rest break briefly at 59' due to [place].  Pt c/o dizziness in sitting after w/c propulsion.  This passed after 2 minutes. O2 sats 97% and HR 85.   Gait with RW x 40' min guard assist, very effortful , wearing new Croc shoe on R foot, over Ace wraps.  Pt reported foot felt better with shoe.  Pt observed hip precautions without cues.  Gait x 15' mat> NuStep as above.  neuromuscular re-education via demo, VCs for RLE self stretching hamstring and heel cord, in sitting, x 30 seconds x 3 each.  Therapeutic exercise performed with LE to increase strength for functional mobility: NUStep at level 4, limited excursion for LLE flexibility, no wt bearing through LLE ex except passive weight.  Tx 2:  Pain rated 3/10 L hip; premedicated.  Pt propelled to gym modified independent for tx.  He reported pain dorsum of R ankle, possibly from ACE wraps.  ACE wraps removed; slightly reddened area noted.  Pt returned to room at end of session, modified independent for activity tolerance, requiring rest break as above.  Gait x 50' (25' , turn, 25') with RW, min guard assist, occasional VCs  for stepping technique LLE.   Sit> supine on mat , using leg lifter strap, but still requiring min assist for LLE, to lie on wedges and pillows for therex. Supine> sit with max assist for elevating trunk and L LE management.  Pt pulls up with bil hands on therapist's forearm when bed rail is not available.   Therapeutic exercise performed with LE to increase strength for functional mobility. In supine: 2 x 10 L short arc quad ext, L heel slides and hip abd using Theraglide fabric, alternating L and R ankle pumps,  R short arc quad ext with 4# on ankle, bil glut sets; 10 x 1 straight leg raise.  Educated pt's wife on positioning techniques for pt's L hip pain when he is in bed, and managing LLE muscle spasms which awaken pt at night. PT reapplied ACE wraps on RLE (reddened area mostly resolved.)    Therapy Documentation Precautions:  Precautions Precautions: Posterior Hip;Fall Precaution Booklet Issued: Yes (comment) Precaution Comments: recalled 3/3 precautions Restrictions Weight Bearing Restrictions: Yes RLE Weight Bearing: Weight bearing as tolerated LLE Weight Bearing: Touchdown weight bearing     Locomotion : Ambulation Ambulation/Gait Assistance: 4: Min assist Wheelchair Mobility Distance: 150       See FIM for current functional status  Therapy/Group: Individual Therapy  Jera Headings 02/13/2014, 3:43 PM

## 2014-02-13 NOTE — Plan of Care (Signed)
Problem: RH PAIN MANAGEMENT Goal: RH STG PAIN MANAGED AT OR BELOW PT'S PAIN GOAL <5  Outcome: Progressing Takes PRN oxy IR 10mg  q3h around the clock, and keeps pain at acceptable level

## 2014-02-14 ENCOUNTER — Encounter (HOSPITAL_COMMUNITY): Payer: Self-pay | Admitting: Occupational Therapy

## 2014-02-14 ENCOUNTER — Inpatient Hospital Stay (HOSPITAL_COMMUNITY): Payer: Self-pay | Admitting: *Deleted

## 2014-02-14 DIAGNOSIS — I2510 Atherosclerotic heart disease of native coronary artery without angina pectoris: Secondary | ICD-10-CM | POA: Diagnosis not present

## 2014-02-14 DIAGNOSIS — S72033A Displaced midcervical fracture of unspecified femur, initial encounter for closed fracture: Secondary | ICD-10-CM | POA: Diagnosis not present

## 2014-02-14 DIAGNOSIS — S329XXA Fracture of unspecified parts of lumbosacral spine and pelvis, initial encounter for closed fracture: Secondary | ICD-10-CM | POA: Diagnosis not present

## 2014-02-14 LAB — CBC WITH DIFFERENTIAL/PLATELET
Basophils Absolute: 0 10*3/uL (ref 0.0–0.1)
Basophils Relative: 0 % (ref 0–1)
Eosinophils Absolute: 0.2 10*3/uL (ref 0.0–0.7)
Eosinophils Relative: 2 % (ref 0–5)
HEMATOCRIT: 29.2 % — AB (ref 39.0–52.0)
HEMOGLOBIN: 9.5 g/dL — AB (ref 13.0–17.0)
LYMPHS ABS: 1.5 10*3/uL (ref 0.7–4.0)
LYMPHS PCT: 15 % (ref 12–46)
MCH: 32.1 pg (ref 26.0–34.0)
MCHC: 32.5 g/dL (ref 30.0–36.0)
MCV: 98.6 fL (ref 78.0–100.0)
Monocytes Absolute: 0.7 10*3/uL (ref 0.1–1.0)
Monocytes Relative: 7 % (ref 3–12)
Neutro Abs: 7.6 10*3/uL (ref 1.7–7.7)
Neutrophils Relative %: 76 % (ref 43–77)
Platelets: 246 10*3/uL (ref 150–400)
RBC: 2.96 MIL/uL — AB (ref 4.22–5.81)
RDW: 14.5 % (ref 11.5–15.5)
WBC: 10 10*3/uL (ref 4.0–10.5)

## 2014-02-14 LAB — PROTIME-INR
INR: 2.02 — ABNORMAL HIGH (ref 0.00–1.49)
Prothrombin Time: 22.9 seconds — ABNORMAL HIGH (ref 11.6–15.2)

## 2014-02-14 MED ORDER — LIDOCAINE 5 % EX PTCH
1.0000 | MEDICATED_PATCH | CUTANEOUS | Status: DC
  Administered 2014-02-14 – 2014-02-21 (×8): 1 via TRANSDERMAL
  Filled 2014-02-14 (×8): qty 1

## 2014-02-14 MED ORDER — OXYCODONE HCL ER 20 MG PO T12A
20.0000 mg | EXTENDED_RELEASE_TABLET | Freq: Two times a day (BID) | ORAL | Status: DC
  Administered 2014-02-14 – 2014-02-19 (×11): 20 mg via ORAL
  Filled 2014-02-14 (×11): qty 1

## 2014-02-14 MED ORDER — WARFARIN SODIUM 7.5 MG PO TABS
7.5000 mg | ORAL_TABLET | Freq: Once | ORAL | Status: AC
  Administered 2014-02-14: 7.5 mg via ORAL
  Filled 2014-02-14: qty 1
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
## 2014-02-14 NOTE — Progress Notes (Signed)
Physical Therapy Session Note  Patient Details  Name: Joseph [name] MRN: [id number] Date of Birth: 1966-04-15  Today's Date: 02/14/2014 PT Individual Time: 0930-1030 and 13:00-14:00 (54min)  PT Individual Time Calculation (min): 60 min   Short Term Goals: Week 1:  PT Short Term Goal 1 (Week 1): pt will move supine> sit with assistance of 1 person PT Short Term Goal 2 (Week 1): pt will move sit> supine with supervision, with use of adaptive equipment PRN PT Short Term Goal 3 (Week 1): pt will transfer bed>< w/c with close supervision PT Short Term Goal 4 (Week 1): pt will propel w/c x 150' with supervision including carpet, slight incline PT Short Term Goal 5 (Week 1): pt will perform gait x 50' with min assist  Skilled Therapeutic Interventions/Progress Updates:  First tx focused on pain management, edema reduction with Kinesiotape, therex for strengthening/ROM, and gait with RW.  Pt up in [facility] with continued swelling. Switched to elevated leg rests and educated pt/wife on LE elevated in supine for optimal positioning. Pt able to verbalize all LE precautions, and discussed functional implications.  Pt propelled WC 2x150' Mod I with brief rest and assist for WC parts. Gait with RW x40' with TDWB and min-guard A.   Instructed pt in seated and standing therex including each of the following 2x10 with cues for technique:  Ankle pumps, LAQ, marching with 4# R weight but minimal ROM on L,  Standing HS curl and marching on L Seated self-stretch with leg lifter 4x30sec for HS and heel cord bil (gentle on LLE)  Applied Kinesiotape for lymphatic drainage in fan pattern for increased circulation and decreased swelling:  LLE:  Two 3" strips, one beginning at medial and another at lateral aspects of knee, fanning up to upper thigh with 25% stretch distal>proximal RLE: One 3" strip, starting jsut below center of patella, fanning up to upper thigh.  Pt reports no discomfort and educated on goal and wear  schedule.  Pt returned to room with wife and all needs in reach.    Second tx focused on therex for strength and ROM, gait and stair/curb training, and functional mobility. Pain 6/10 during ex, so provided rest prn and ice. Pt premedicated. Edema seems softer this afternoon.   Pt propelled WC 2x150' Mod I, advised to bring self to PT.  Performed gait 2x30' with RW and TDWB technique with min-guard >> S; pt able to step back for transfers well.  Instructed pt in sit>supine with leg lifter, needing only min lifting assist for LLE; Mod trunk lifting assist for supine>sit due to rib pain.  Instructed pt in supine HEP for strength and ROM 2x10 each per handout as well as RLE SLR. Pt able to increased ROM for heel slide today.   Instructed pt in 1 stair with bil rails and curb step with RW, which he was able to return demo with min A for steadying, adhering to TDWB precautions. Did not push more stairs due to anticipating rib pain.   Nustep x72min with bil UE and LE, level 4 for strength and ROM.  Pt left up in room with all needs in reach.         Therapy Documentation Precautions:  Precautions Precautions: Posterior Hip;Fall Precaution Booklet Issued: Yes (comment) Precaution Comments: recalled 3/3 precautions Restrictions Weight Bearing Restrictions: Yes RLE Weight Bearing: Weight bearing as tolerated LLE Weight Bearing: Touchdown weight bearing Pain: Pain Assessment Pain Score: 4  start of tx, 6/10 after ex.  Modified tx prn and provided ice after.  Locomotion : Ambulation Ambulation/Gait Assistance: 4: Min Financial controller Distance: 150   See FIM for current functional status  Therapy/Group: Individual Therapy Kennieth Rad, PT, DPT   02/14/2014, 10:42 AM
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
## 2014-02-14 NOTE — Progress Notes (Signed)
Occupational Therapy Session Note  Patient Details  Name: Joseph [name] MRN: [id number] Date of Birth: 1965/10/23  Today's Date: 02/14/2014 OT Individual Time: 0800-0900 OT Individual Time Calculation (min): 60 min    Short Term Goals: Week 1:  OT Short Term Goal 1 (Week 1): Pt will be able to transfer to [facility] with min A w/c to [facility]. OT Short Term Goal 2 (Week 1): Pt will be able to stand with min A to RW to complete LB dressing. OT Short Term Goal 3 (Week 1): Pt will be able to don pants using reacher and pull them over his hips with min A. OT Short Term Goal 4 (Week 1): Pt will be able to bathe LB with min A.  OT Short Term Goal 5 (Week 1): Pt and pt's wife will be able to complete the [facility] transfers with min A to allow her to assist him as needed.  Skilled Therapeutic Interventions/Progress Updates:    Pt seen for BADL retraining of bathing and dressing with a focus on use of AE, functional mobility with hip and WB precautions, and activity tolerance. Pt had more energy today and was eager to shower. Pt completed grooming/shaving at sink in w/c and then ambulated with RW to shower with steady A and min A to lower down onto shower seat. Pt relied heavily on the grab bars to lower himself safely. Pt provided with a long handle sponge to wash his feet and his bottom in standing.  He then ambulated back to w/c to complete LB dressing of shorts with Reacher. Pt continues to receive A to don ACE wrap and socks.  Pt provided with reacher bag for walker and 5# dowel bar to use in his room for UE exercises. Reviewed each exercise with pt. Pt was resting in w/c at end of session with call light in reach.  Therapy Documentation Precautions:  Precautions Precautions: Posterior Hip;Fall Precaution Booklet Issued: Yes (comment) Precaution Comments: recalled 3/3 precautions Restrictions Weight Bearing Restrictions: Yes RLE Weight Bearing: Weight bearing as tolerated LLE Weight Bearing: Touchdown  weight bearing   Pain: Pain Assessment Pain Assessment: 0-10 Pain Score: 4  Pain Type: Acute pain Pain Location: Hip Pain Orientation: Left Pain Radiating Towards: Right ribs Pain Descriptors / Indicators: Aching;Discomfort Pain Frequency: Intermittent Pain Onset: Gradual Patients Stated Pain Goal: 2 Pain Intervention(s): premedicated ADL: ADL ADL Comments: refer to FIM   See FIM for current functional status  Therapy/Group: Individual Therapy  SAGUIER,JULIA 02/14/2014, 11:15 AM
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
## 2014-02-15 ENCOUNTER — Encounter (HOSPITAL_COMMUNITY): Payer: Self-pay | Admitting: Occupational Therapy

## 2014-02-15 ENCOUNTER — Inpatient Hospital Stay (HOSPITAL_COMMUNITY): Payer: Self-pay | Admitting: Occupational Therapy

## 2014-02-15 DIAGNOSIS — E782 Mixed hyperlipidemia: Secondary | ICD-10-CM

## 2014-02-15 DIAGNOSIS — R011 Cardiac murmur, unspecified: Secondary | ICD-10-CM

## 2014-02-15 DIAGNOSIS — G4730 Sleep apnea, unspecified: Secondary | ICD-10-CM

## 2014-02-15 DIAGNOSIS — S72009D Fracture of unspecified part of neck of unspecified femur, subsequent encounter for closed fracture with routine healing: Secondary | ICD-10-CM

## 2014-02-15 LAB — OCCULT BLOOD X 1 CARD TO LAB, STOOL: Fecal Occult Bld: NEGATIVE

## 2014-02-15 LAB — PROTIME-INR
INR: 2.12 — ABNORMAL HIGH (ref 0.00–1.49)
PROTHROMBIN TIME: 23.7 s — AB (ref 11.6–15.2)

## 2014-02-15 MED ORDER — WARFARIN SODIUM 7.5 MG PO TABS
7.5000 mg | ORAL_TABLET | Freq: Once | ORAL | Status: AC
  Administered 2014-02-15: 7.5 mg via ORAL
  Filled 2014-02-15: qty 1

## 2014-02-15 NOTE — Plan of Care (Signed)
Problem: RH PAIN MANAGEMENT Goal: RH STG PAIN MANAGED AT OR BELOW PT'S PAIN GOAL <5  Outcome: Progressing Needs prn oxy IR less frequently today than previous days, pain better controlled

## 2014-02-15 NOTE — Progress Notes (Signed)
ANTICOAGULATION CONSULT NOTE - Follow Up Consult  Pharmacy Consult for coumadin Indication: VTE prophylaxis  Allergies  Allergen Reactions  . Penicillins Other (See Comments)    Unknown  . Tetanus Toxoid Other (See Comments)    REACTION: unknown    Patient Measurements: Weight: 292 lb 15.9 oz (132.9 kg) Heparin Dosing Weight:   Vital Signs: Temp: 97.6 F (36.4 C) (08/29 0600) Temp src: Oral (08/29 0600) BP: 126/64 mmHg (08/29 0600) Pulse Rate: 89 (08/29 0600)  Labs:  Recent Labs  02/13/14 0916 02/14/14 0808 02/15/14 0650  HGB  --  9.5*  --   HCT  --  29.2*  --   PLT  --  246  --   LABPROT 22.6* 22.9* 23.7*  INR 1.99* 2.02* 2.12*    The CrCl is unknown because both a height and weight (above a minimum accepted value) are required for this calculation.   Medications:  Scheduled:  . finasteride  5 mg Oral Daily  . folic acid  1 mg Oral Daily  . iron polysaccharides  150 mg Oral BID AC  . levothyroxine  100 mcg Oral QAC breakfast  . lidocaine  1 patch Transdermal Q24H  . lisinopril  10 mg Oral Daily  . metoprolol succinate  25 mg Oral Daily  . multivitamin with minerals  1 tablet Oral Daily  . OxyCODONE  20 mg Oral Q12H  . polyethylene glycol  17 g Oral BID  . simvastatin  5 mg Oral q1800  . sodium phosphate  1 enema Rectal Once  . thiamine  100 mg Oral Daily  . ticagrelor  90 mg Oral BID  . Warfarin - Pharmacist Dosing Inpatient   Does not apply q1800   Infusions:    Assessment: 48 yo male s/p ortho surgery is currently on therapeutic coumadin.  INR today is 2.12.  Goal of Therapy:  INR 2-3 Monitor platelets by anticoagulation protocol: Yes   Plan:  - Warfarin 7.5 mg PO tonight  - INR daily, watch H/H - Monitor for s/sx of bleeding  - Discontinue Lovenox   Makhiya Coburn, Tsz-Yin 02/15/2014,8:28 AM

## 2014-02-15 NOTE — Progress Notes (Signed)
Joseph [name] is a 48 y.o. male Sep 12, 1965 [id number]  Subjective: No new complaints. No new problems. Slept well. Feeling OK.  Objective: Vital signs in last 24 hours: Temp:  [97.6 F (36.4 C)-98.9 F (37.2 C)] 97.6 F (36.4 C) (08/29 0600) Pulse Rate:  [89-106] 89 (08/29 0600) Resp:  [18-20] 18 (08/29 0600) BP: (116-126)/(61-64) 126/64 mmHg (08/29 0600) SpO2:  [94 %-96 %] 96 % (08/29 0600) Weight change:  Last BM Date: 02/14/14  Intake/Output from previous day: 08/28 0701 - 08/29 0700 In: 480 [P.O.:480] Out: -  Last cbgs: CBG (last 3)  No results found for this basename: GLUCAP,  in the last 72 hours   Physical Exam General: No apparent distress   HEENT: not dry Lungs: Normal effort. Lungs clear to auscultation, no crackles or wheezes. Cardiovascular: Regular rate and rhythm, no edema  2+ murmur Abdomen: S/NT/ND; BS(+) Musculoskeletal:  unchanged Neurological: No new neurological deficits Wounds: N/A    Skin: clear   Mental state: Alert, oriented, cooperative    Lab Results: BMET    Component Value Date/Time   NA 139 02/12/2014 0420   K 4.0 02/12/2014 0420   CL 100 02/12/2014 0420   CO2 28 02/12/2014 0420   GLUCOSE 126* 02/12/2014 0420   BUN 15 02/12/2014 0420   CREATININE 0.78 02/12/2014 0420   CALCIUM 8.1* 02/12/2014 0420   GFRNONAA >90 02/12/2014 0420   GFRAA >90 02/12/2014 0420   CBC    Component Value Date/Time   WBC 10.0 02/14/2014 0808   RBC 2.96* 02/14/2014 0808   HGB 9.5* 02/14/2014 0808   HCT 29.2* 02/14/2014 0808   PLT 246 02/14/2014 0808   MCV 98.6 02/14/2014 0808   MCH 32.1 02/14/2014 0808   MCHC 32.5 02/14/2014 0808   RDW 14.5 02/14/2014 0808   LYMPHSABS 1.5 02/14/2014 0808   MONOABS 0.7 02/14/2014 0808   EOSABS 0.2 02/14/2014 0808   BASOSABS 0.0 02/14/2014 0808    Studies/Results: No results found.  Medications: I have reviewed the patient's current medications.  Assessment/Plan:   1. Functional deficits secondary to left femoral head  Fracture with disolocation and acetabular fx  2. DVT Prophylaxis/Anticoagulation: Pharmaceutical: Coumadin  3. Pain Management: oxycodone 10-15mg  prn. Increase OxyContin CR to 20mg  BID, Add Lidoderm for R rib pain  4. Mood: Wife supportive and has been here. LCSW to follow for evaluation and support.  5. Neuropsych: This patient is capable of making decisions on his own behalf.  6. Skin/Wound Care: RN to monitor skin daily. Pressure relief measures. Maintain adequate hydration and nutritional status. Add nutritional supplement due to high protein needs.  7. CAD: Continue Brillinta/ASA due to recent stent. On metoprolol, zocor and lisinopril.  8. ABLA: Continues to have drop in H/H. Will recheck in am. Transfuse if < 7.0 or symptomatic. Add iron supplement  9. Leucocytosis: Monitor for signs of infection. Likely reactive. Recheck CBC in am.  10. OSA: Continue CPAP  11. HTN: will monitor BP every 8 hours. Continue Lisinopril and Proscar.  12. Constipation: Now on miralax bid. Enema today if no results past supper.  13. Chronic right ankle pain---will look into orthotics/support for right ankle. May benefit from a 1/4" heel wedge to allow for mild plantar flexion contracture.  -could also consider ASO or walking boot of some sort  -needs pain mgt - oxyCR    Length of stay, days: 4  Walker Kehr , MD 02/15/2014, 8:35 AM

## 2014-02-15 NOTE — Progress Notes (Signed)
Occupational Therapy Session Note  Patient Details  Name: Joseph [name] MRN: [id number] Date of Birth: 01-23-1966  Today's Date: 02/15/2014 OT Individual Time: 1100-1200 and 1400-1500 OT Individual Time Calculation (min): 60 min and 60 min   Short Term Goals: Week 1:  OT Short Term Goal 1 (Week 1): Pt will be able to transfer to [facility] with min A w/c to [facility]. OT Short Term Goal 2 (Week 1): Pt will be able to stand with min A to RW to complete LB dressing. OT Short Term Goal 3 (Week 1): Pt will be able to don pants using reacher and pull them over his hips with min A. OT Short Term Goal 4 (Week 1): Pt will be able to bathe LB with min A.  OT Short Term Goal 5 (Week 1): Pt and pt's wife will be able to complete the [facility] transfers with min A to allow her to assist him as needed.  Skilled Therapeutic Interventions/Progress Updates:  Session 1  (1100-1200)- Pt with 5/10 c/o pain in L hip and reports rest and repositioning assist in pain management. Pt ambulated with RW ~ 10 feet into bathroom for transfer onto walk in shower to sit on shower chair with Min A for balance and safety. Pt maintaining weight bearing restrictions throughout session. Pt also able to accurately verbalize weight bearing restrictions and hip precautions. Pt required steady assist when standing to wash buttocks. Pt utilized long handled sponge and long handled reacher to assist in increasing independence with bathing. OT provided verbal cues and demonstration for use of reacher to assist in LB dressing. Pt returned demonstration without difficulty. Pt ambulated from shower back into room with RW and Min A. Pt requiring rest break secondary to fatigue and increased pain. OT educated and demonstrated to his wife on how to utilize ACE wraps for B LEs in order to decrease edema. She demonstrated understanding by placing wraps onto other leg with verbal cues for proper technique. Pt seated in wheelchair with wife present all needed items  within reach.   Session 2 ( 1400-1500)- Pt with continued 5/10 pain in L hip this session. Pt propelled wheelchair to apartment for education. His wife present for session and education as well. OT demonstrated tub bench transfer in shower/tub combination with use of leg lifter in order to maintain hip precautions. Pt required to scoot bottom as far back as possible onto bench in order to maintain precautions when getting legs within tub. Pt demonstrated with verbal cues for proper technique. OT discussed recommendations for bathroom in order to decrease fall risk within environment. Pt and wife verbalize understanding. Pt performed 2 sets of 10 wheelchair push ups in order to increase B UEs for functional transfers.   Therapy Documentation Precautions:  Precautions Precautions: Posterior Hip;Fall Precaution Booklet Issued: Yes (comment) Precaution Comments: recalled 3/3 precautions Restrictions Weight Bearing Restrictions: Yes RLE Weight Bearing: Weight bearing as tolerated LLE Weight Bearing: Touchdown weight bearing Pain: Pain Assessment Pain Assessment: 0-10 Pain Score: 5  Pain Type: Surgical pain Pain Location: Hip Pain Orientation: Left Pain Descriptors / Indicators: Aching;Sore Pain Frequency: Intermittent Pain Onset: With Activity Patients Stated Pain Goal: 2 Pain Intervention(s): Medication (See eMAR) ADL: ADL ADL Comments: refer to FIM  See FIM for current functional status  Therapy/Group: Individual Therapy  Phineas Semen 02/15/2014, 12:47 PM

## 2014-02-16 ENCOUNTER — Inpatient Hospital Stay (HOSPITAL_COMMUNITY): Payer: Self-pay

## 2014-02-16 ENCOUNTER — Encounter (HOSPITAL_COMMUNITY): Payer: Self-pay

## 2014-02-16 DIAGNOSIS — I2510 Atherosclerotic heart disease of native coronary artery without angina pectoris: Secondary | ICD-10-CM

## 2014-02-16 DIAGNOSIS — G4700 Insomnia, unspecified: Secondary | ICD-10-CM

## 2014-02-16 LAB — PROTIME-INR
INR: 2.46 — AB (ref 0.00–1.49)
PROTHROMBIN TIME: 26.7 s — AB (ref 11.6–15.2)

## 2014-02-16 MED ORDER — ZOLPIDEM TARTRATE 5 MG PO TABS
5.0000 mg | ORAL_TABLET | Freq: Every evening | ORAL | Status: DC | PRN
  Administered 2014-02-16 – 2014-02-17 (×4): 5 mg via ORAL
  Filled 2014-02-16 (×4): qty 1

## 2014-02-16 MED ORDER — WARFARIN SODIUM 5 MG PO TABS
5.0000 mg | ORAL_TABLET | Freq: Once | ORAL | Status: AC
  Administered 2014-02-16: 5 mg via ORAL
  Filled 2014-02-16: qty 1

## 2014-02-16 NOTE — Progress Notes (Signed)
Pt defers additional PT session at this time secondary to fatigue.  [place], DPT [phone]

## 2014-02-16 NOTE — Progress Notes (Signed)
Occupational Therapy Session Note  Patient Details  Name: Joseph [name] MRN: [id number] Date of Birth: 1965/08/27  Today's Date: 02/16/2014 OT Individual Time: 1100-1200 OT Individual Time Calculation (min): 60 min    Short Term Goals: Week 1:  OT Short Term Goal 1 (Week 1): Pt will be able to transfer to [facility] with min A w/c to [facility]. OT Short Term Goal 2 (Week 1): Pt will be able to stand with min A to RW to complete LB dressing. OT Short Term Goal 3 (Week 1): Pt will be able to don pants using reacher and pull them over his hips with min A. OT Short Term Goal 4 (Week 1): Pt will be able to bathe LB with min A.  OT Short Term Goal 5 (Week 1): Pt and pt's wife will be able to complete the [facility] transfers with min A to allow her to assist him as needed.  Skilled Therapeutic Interventions/Progress Updates:    Pt sitting in w/c at sink with wife present.  Pt engaged in bathing and dressing tasks with focus on activity tolerance, safety awareness, sit<>stand, standing balance, and AE use.  Pt completed all bathing tasks with steady A when standing.  Pt required assistance with LB dressing (BLE) secondary to ACE wraps on BLE.  Wife applied Ace wraps on BLE appropriately.  Pt uses AE appropriately.  Pt recalls hip precautions and follows precautions without verbal cues.   Therapy Documentation Precautions:  Precautions Precautions: Posterior Hip;Fall Precaution Booklet Issued: Yes (comment) Precaution Comments: recalled 3/3 precautions Restrictions Weight Bearing Restrictions: Yes RLE Weight Bearing: Weight bearing as tolerated (but limit WB due to old injury) LLE Weight Bearing: Touchdown weight bearing   Pain: Pain Assessment Pain Assessment: 0-10 Pain Score: 5  Pain Type: Surgical pain Pain Location: Hip Pain Orientation: Left Pain Descriptors / Indicators: Aching Pain Onset: On-going Patients Stated Pain Goal: 2 Pain Intervention(s): RN made aware;Repositioned ADL: ADL ADL  Comments: refer to FIM  See FIM for current functional status  Therapy/Group: Individual Therapy  Leroy Libman 02/16/2014, 12:09 PM

## 2014-02-16 NOTE — Progress Notes (Signed)
Taking PRN pain meds every 3 hours, along with robaxin every 6 hours PRN. Encouraged patient to use I.S. Offered PRN ambien at 2415, but refused. Agreed to try [employer] Joseph [name]

## 2014-02-16 NOTE — Progress Notes (Signed)
Joseph [name] is a 48 y.o. male [birth year] [id number]  Subjective: C/o bad insomnia now. Feeling OK. Pain is controlled w/meds  Objective: Vital signs in last 24 hours: Temp:  [97.6 F (36.4 C)-98.6 F (37 C)] 98.6 F (37 C) (08/30 0601) Pulse Rate:  [79-99] 79 (08/30 0601) Resp:  [18] 18 (08/30 0601) BP: (106-124)/(57-63) 106/57 mmHg (08/30 0601) SpO2:  [93 %-100 %] 94 % (08/30 0601) Weight change:  Last BM Date: 02/14/14  Intake/Output from previous day: 08/29 0701 - 08/30 0700 In: 1920 [P.O.:1920] Out: 350 [Urine:350] Last cbgs: CBG (last 3)  No results found for this basename: GLUCAP,  in the last 72 hours   Physical Exam General: No apparent distress   HEENT: not dry Lungs: Normal effort. Lungs clear to auscultation, no crackles or wheezes. Cardiovascular: Regular rate and rhythm, no edema  2+ murmur Abdomen: S/NT/ND; BS(+) Musculoskeletal:  unchanged Neurological: No new neurological deficits Wounds: clean   Skin: clear   Mental state: Alert, oriented, cooperative    Lab Results: BMET    Component Value Date/Time   NA 139 02/12/2014 0420   K 4.0 02/12/2014 0420   CL 100 02/12/2014 0420   CO2 28 02/12/2014 0420   GLUCOSE 126* 02/12/2014 0420   BUN 15 02/12/2014 0420   CREATININE 0.78 02/12/2014 0420   CALCIUM 8.1* 02/12/2014 0420   GFRNONAA >90 02/12/2014 0420   GFRAA >90 02/12/2014 0420   CBC    Component Value Date/Time   WBC 10.0 02/14/2014 0808   RBC 2.96* 02/14/2014 0808   HGB 9.5* 02/14/2014 0808   HCT 29.2* 02/14/2014 0808   PLT 246 02/14/2014 0808   MCV 98.6 02/14/2014 0808   MCH 32.1 02/14/2014 0808   MCHC 32.5 02/14/2014 0808   RDW 14.5 02/14/2014 0808   LYMPHSABS 1.5 02/14/2014 0808   MONOABS 0.7 02/14/2014 0808   EOSABS 0.2 02/14/2014 0808   BASOSABS 0.0 02/14/2014 0808    Studies/Results: No results found.  Medications: I have reviewed the patient's current medications.  Assessment/Plan:   1. Functional deficits secondary to left femoral  head Fracture with disolocation and acetabular fx  2. DVT Prophylaxis/Anticoagulation: Pharmaceutical: Coumadin  3. Pain Management: oxycodone 10-15mg  prn. Increase OxyContin CR to 20mg  BID, Add Lidoderm for R rib pain  4. Mood: Wife supportive and has been here. LCSW to follow for evaluation and support.  5. Neuropsych: This patient is capable of making decisions on his own behalf.  6. Skin/Wound Care: RN to monitor skin daily. Pressure relief measures. Maintain adequate hydration and nutritional status. Add nutritional supplement due to high protein needs.  7. CAD: Continue Brillinta/ASA due to recent stent. On metoprolol, zocor and lisinopril.  8. ABLA: Continues to have drop in H/H. Will recheck in am. Transfuse if < 7.0 or symptomatic. Add iron supplement  9. Leucocytosis: Monitor for signs of infection. Likely reactive. Recheck CBC in am.  10. OSA: Continue CPAP  11. HTN: will monitor BP every 8 hours. Continue Lisinopril and Proscar.  12. Constipation: Now on miralax bid. Enema today if no results past supper.  13. Chronic right ankle pain---will look into orthotics/support for right ankle. May benefit from a 1/4" heel wedge to allow for mild plantar flexion contracture.  -could also consider ASO or walking boot of some sort  -needs pain mgt - oxyCR 14. Insomnia. Zolpidem prn - discussed w/pt.     Length of stay, days: [place] , MD 02/16/2014, 8:05 AM

## 2014-02-16 NOTE — Progress Notes (Addendum)
Physical Therapy Session Note  Patient Details  Name: Joseph [name] MRN: [id number] Date of Birth: 04-08-1966  Today's Date: 02/16/2014 PT Individual Time: 0174-9449; 6759-1638 PT Individual Time Calculation (min): 75 min , 50 min  Short Term Goals: Week 1:  PT Short Term Goal 1 (Week 1): pt will move supine> sit with assistance of 1 person PT Short Term Goal 2 (Week 1): pt will move sit> supine with supervision, with use of adaptive equipment PRN PT Short Term Goal 3 (Week 1): pt will transfer bed>< w/c with close supervision PT Short Term Goal 4 (Week 1): pt will propel w/c x 150' with supervision including carpet, slight incline PT Short Term Goal 5 (Week 1): pt will perform gait x 50' with min assist  Skilled Therapeutic Interventions/Progress Updates:   Tx 1:  Pain 4/10 L hip and R ribs, premedicated.   Pt and wife reported he did not sleep well at all last night, awaking often due to muscle spasms LLE. Pt has started taking Robaxen.     w/c propulsion x 150' with one rest break, supervision. Gait training on carpet x 50' with min assist for PT steadying RW during sit>< stand. 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  Wife to measure ht of seat.  NuStep x 10 minutes at level 4 for LLE strengthening and flexibility.  RLE hamstrings and heel cord self stretching in sitting, x 30 seconds x 3, observing L hip precautions. Hand out provided. Sit>supine with min assist for LLE lift, to lie propped on wedge plus 3 pillows.  In propped supine: Therapeutic exercise performed with LE to increase strength for functional mobility in supine, head elevated: : 20 x 1 L hip abd, L heel slides, L short arc quad knee ext, L and R alternating ankle pumps, R short arc quad knee ext with 4# wt on ankle.  Supine> sit with mod assist for LLE and assist with trunk elevation, sitting up on L side of mat.  Tx 2:  W/c propulsion x 150' with supervision, 1  rest break. Pain rated 4/10 as above.  Awaiting meds after tx; RN aware.  Therapeutic exercise performed with LEs to increase strength for functional mobility, Via handout for L surgical hip.  Pt able to do sitting: L knee ext with DF at end range, and standing therex, at high sturdy table: L hip/knee flexion to place L foot on 4" high step, knee flexion with neutral hip, hip ext, hip abd;  10 x 1 each.  Pt benefits from R hip ER at least 45 degrees to relieve some pressure through R ankle, which is painful due to OA. When standing at table for exs, pt required pillow under L forearm due to edematous area which is tender.   Gait x 30' with RW, min assist for steadying RW during sit> stand.  Up/down ramp (no step) with RW with close supervision, mod cues.  Pt has many questions about his long-term prognosis, and ability to return to work.  Discussed HHPT vs OPPT; pt would benefit from HHPT until his wt bearing status LLE increases.  Pt exhausted, diaphoretic, rated pain 8/10 L hip by end of session; pt due for meds.  [name] modified independent in w/c for activity tolerance; wife present in room  Therapy Documentation Precautions:  Precautions Precautions: Posterior Hip;Fall Precaution Booklet Issued: Yes (comment) Precaution Comments: recalled 3/3 precautions Restrictions Weight Bearing Restrictions: Yes RLE Weight  Bearing: Weight bearing as tolerated LLE Weight Bearing: Touchdown weight bearing General: PT Amount of Missed Time (min): 45 Minutes (Pt defers additional PT session at this time secondary to fatigue.) PT Missed Treatment Reason: Patient fatigue   See FIM for current functional status  Therapy/Group: Individual Therapy  Joseph [name] 02/16/2014, 4:31 PM

## 2014-02-16 NOTE — Progress Notes (Signed)
ANTICOAGULATION CONSULT NOTE - Follow Up Consult  Pharmacy Consult for coumadin Indication: VTE prophylaxis  Allergies  Allergen Reactions  . Penicillins Other (See Comments)    Unknown  . Tetanus Toxoid Other (See Comments)    REACTION: unknown    Patient Measurements: Weight: 292 lb 15.9 oz (132.9 kg) Heparin Dosing Weight:   Vital Signs: Temp: 98.6 F (37 C) (08/30 0601) Temp src: Oral (08/30 0601) BP: 106/57 mmHg (08/30 0601) Pulse Rate: 79 (08/30 0601)  Labs:  Recent Labs  02/14/14 0808 02/15/14 0650 02/16/14 0601  HGB 9.5*  --   --   HCT 29.2*  --   --   PLT 246  --   --   LABPROT 22.9* 23.7* 26.7*  INR 2.02* 2.12* 2.46*    The CrCl is unknown because both a height and weight (above a minimum accepted value) are required for this calculation.   Medications:  Scheduled:  . finasteride  5 mg Oral Daily  . folic acid  1 mg Oral Daily  . iron polysaccharides  150 mg Oral BID AC  . levothyroxine  100 mcg Oral QAC breakfast  . lidocaine  1 patch Transdermal Q24H  . lisinopril  10 mg Oral Daily  . metoprolol succinate  25 mg Oral Daily  . multivitamin with minerals  1 tablet Oral Daily  . OxyCODONE  20 mg Oral Q12H  . polyethylene glycol  17 g Oral BID  . simvastatin  5 mg Oral q1800  . sodium phosphate  1 enema Rectal Once  . thiamine  100 mg Oral Daily  . ticagrelor  90 mg Oral BID  . Warfarin - Pharmacist Dosing Inpatient   Does not apply q1800   Infusions:    Assessment: 48 yo male s/p ORIF is currently on therapeutic coumadin.  INR up to 2.46. Goal of Therapy:  INR 2-3 Monitor platelets by anticoagulation protocol: Yes   Plan:  - Warfarin 5 mg po x1 (patient may need 7.5mg  few days a week and 5 mg other days) - INR daily, watch H/H - Monitor for s/sx of bleeding    Guneet Delpino, Tsz-Yin 02/16/2014,8:33 AM

## 2014-02-17 ENCOUNTER — Inpatient Hospital Stay (HOSPITAL_COMMUNITY): Payer: Self-pay

## 2014-02-17 ENCOUNTER — Encounter (HOSPITAL_COMMUNITY): Payer: Self-pay

## 2014-02-17 DIAGNOSIS — S72009B Fracture of unspecified part of neck of unspecified femur, initial encounter for open fracture type I or II: Secondary | ICD-10-CM | POA: Diagnosis not present

## 2014-02-17 DIAGNOSIS — I2510 Atherosclerotic heart disease of native coronary artery without angina pectoris: Secondary | ICD-10-CM | POA: Diagnosis not present

## 2014-02-17 DIAGNOSIS — I10 Essential (primary) hypertension: Secondary | ICD-10-CM

## 2014-02-17 DIAGNOSIS — S329XXA Fracture of unspecified parts of lumbosacral spine and pelvis, initial encounter for closed fracture: Secondary | ICD-10-CM

## 2014-02-17 LAB — OCCULT BLOOD X 1 CARD TO LAB, STOOL: FECAL OCCULT BLD: NEGATIVE

## 2014-02-17 LAB — PROTIME-INR
INR: 2.39 — ABNORMAL HIGH (ref 0.00–1.49)
PROTHROMBIN TIME: 26.1 s — AB (ref 11.6–15.2)

## 2014-02-17 MED ORDER — WARFARIN SODIUM 5 MG PO TABS
5.0000 mg | ORAL_TABLET | Freq: Once | ORAL | Status: AC
  Administered 2014-02-17: 5 mg via ORAL
  Filled 2014-02-17: qty 1

## 2014-02-17 NOTE — Progress Notes (Signed)
ANTICOAGULATION CONSULT NOTE - Follow Up Consult  Pharmacy Consult for coumadin Indication: VTE prophylaxis  Allergies  Allergen Reactions  . Penicillins Other (See Comments)    Unknown  . Tetanus Toxoid Other (See Comments)    REACTION: unknown    Patient Measurements: Weight: 292 lb 15.9 oz (132.9 kg) Heparin Dosing Weight:   Vital Signs: Temp: 98.9 F (37.2 C) (08/31 0622) Temp src: Oral (08/31 0622) BP: 120/62 mmHg (08/31 0845) Pulse Rate: 89 (08/31 0622)  Labs:  Recent Labs  02/15/14 0650 02/16/14 0601 02/17/14 0830  LABPROT 23.7* 26.7* 26.1*  INR 2.12* 2.46* 2.39*    The CrCl is unknown because both a height and weight (above a minimum accepted value) are required for this calculation.  Assessment: 47 yo male s/p ORIF is currently on therapeutic coumadin.  INR up to 2.39.  Goal of Therapy:  INR 2-3 Monitor platelets by anticoagulation protocol: Yes   Plan:  - Warfarin 5 mg po x1 (patient may need 7.5mg  few days a week and 5 mg other days) - INR daily, watch H/H - Monitor for s/sx of bleeding   Maryanna Shape, PharmD, BCPS  Clinical Pharmacist  Pager: [phone]   02/17/2014,12:35 PM

## 2014-02-17 NOTE — Progress Notes (Signed)
Physical Therapy Session Note  Patient Details  Name: Joseph [name] MRN: [id number] Date of Birth: [birth year]  Today's Date: 02/17/2014 PT Individual Time: 0903-1003; 1300-1400 PT Individual Time Calculation (min): 60 min , 60 min  Short Term Goals: Week 1:  PT Short Term Goal 1 (Week 1): pt will move supine> sit with assistance of 1 person PT Short Term Goal 2 (Week 1): pt will move sit> supine with supervision, with use of adaptive equipment PRN PT Short Term Goal 3 (Week 1): pt will transfer bed>< w/c with close supervision PT Short Term Goal 4 (Week 1): pt will propel w/c x 150' with supervision including carpet, slight incline PT Short Term Goal 5 (Week 1): pt will perform gait x 50' with min assist  Skilled Therapeutic Interventions/Progress Updates:   Tx 1: W/c propulsion x 150' x 2 with 1 rest break after 100' each trip, for activity tolerance.  Pt reported that his [place] ranger seat ht is 31.5".  Simulated car transfer in gym using raised mat (29" max) and high footstool, using RW for stand pivot transfer, with min assist for LLE.  Gait training with RW x 50' with min assist to steady RW, close supervision for actual ambulation, VC for optimal position of R foot before sit>< stand.  Therapeutic exercise performed with LE to increase strength and flexibility for functional mobility: self stretching R hamstrings and heel cord in sitting 3 x 30 seconds; 20 x 1  long arc quad knee ext L with 4# wt on ankle, 10 x 1 R without wt, with ankle pumps at end range, LLE kicking therapy ball x 10, bil hip abd against light green Theraband x 10 . Trunk and bil UE activity with 5# wt'd bar to volley beach ball x 6 minutes, in supported sitting.   Discussed lift time fitness after PT is concluded, to focus on healthier weight and reduced stress on arthritic R ankle.     Tx 2: co-treat with Therapeutic Recreation Therapist  Modified independent w/c propulsion on unit, to bring himself to PT;  he reported he still needs to stop partway to rest due to SOB, fatigue.  W/c parts management modified independent for bil brakes, R legrest swing away; needs assistance for L legrest due to hip precautions.  Gait on level tile x 50', close supervision. Assistance not needed for [place].  Community w/c propulsion x 175' from unit to solarium with rest break when waiting for elevator.  Pt pushed elevator buttons and propelled backwards into elevator safely, x 2.  Managed heavy door from w/c with max VCS, min assist.  Gait on slightly uneven outdoor tile on patio, x 30' with RW, close supervision.    Transfer to wing chair with 2 seat cushions for safe ht regarding L hip precautions, close S.  Standing with RW, 10 x 1 each L hip and knee flex, hip abd. Therapy Documentation Precautions:  Precautions Precautions: Posterior Hip;Fall Precaution Booklet Issued: Yes (comment) Precaution Comments: recalled 3/3 precautions Restrictions Weight Bearing Restrictions: Yes RLE Weight Bearing: Weight bearing as tolerated LLE Weight Bearing: Touchdown weight bearing   Pain: Pain Assessment Pain Score: 3 AM session and PM session Pain Location: Hip (and L lower back) Pain Orientation: Left Pain Intervention(s): Medication (See eMAR)        See FIM for current functional status  Therapy/Group: Individual Therapy  Amando Ishikawa 02/17/2014, 12:19 PM

## 2014-02-17 NOTE — Progress Notes (Signed)
Much better night since taking Ambien. [facility] sent this AM. Decreased pain to left flank, but still there. Joseph [name] A

## 2014-02-17 NOTE — Progress Notes (Signed)
Occupational Therapy Session Note  Patient Details  Name: Joseph [name] MRN: [id number] Date of Birth: July 05, 1965  Today's Date: 02/17/2014 OT Individual Time: 4008-6761 OT Individual Time Calculation (min): 60 min    Short Term Goals: Week 1:  OT Short Term Goal 1 (Week 1): Pt will be able to transfer to [facility] with min A w/c to [facility]. OT Short Term Goal 2 (Week 1): Pt will be able to stand with min A to RW to complete LB dressing. OT Short Term Goal 3 (Week 1): Pt will be able to don pants using reacher and pull them over his hips with min A. OT Short Term Goal 4 (Week 1): Pt will be able to bathe LB with min A.  OT Short Term Goal 5 (Week 1): Pt and pt's wife will be able to complete the [facility] transfers with min A to allow her to assist him as needed.  Skilled Therapeutic Interventions/Progress Updates:    Pt seen for ADL retraining with focus on functional transfers, activity tolerance, standing balance, and adherence to precautions. Pt received sitting in w/c. Completed grooming tasks from w/c level at sink. Ambulated to walk-in shower with SBA-min assist. Completed bathing and dressing with use of AE and adhering to precautions without cues. Pt Wife applied ACE wraps to BLE appropriately. Propelled self to ADL apartment and completed tub transfer with TTB and min assist to manage LLE. Pt returned to room and left with all needs in reach.   Therapy Documentation Precautions:  Precautions Precautions: Posterior Hip;Fall Precaution Booklet Issued: Yes (comment) Precaution Comments: recalled 3/3 precautions Restrictions Weight Bearing Restrictions: Yes RLE Weight Bearing: Weight bearing as tolerated LLE Weight Bearing: Touchdown weight bearing General:   Vital Signs: Therapy Vitals BP: 120/62 mmHg Pain: No report of pain during therapy session.  See FIM for current functional status  Therapy/Group: Individual Therapy  Duayne Cal 02/17/2014, 12:23 PM

## 2014-02-17 NOTE — Progress Notes (Signed)
At 2000 patient complaining of severe pain to LLE. And left foot. Gave scheduled Oxy SR along with PRN robaxin and ultram. Also agreed and requested Ambien at 2000. At 2207 2 Oxy IR given along with repeat dose of ambien. Slept until 0330, woke up with "sharp" "kidney" , left flank pain. Large amount of bruising to left flank, abd. And to LLE. Reports decreased urine output. Spot check PVR=39ml. Patrici Ranks A

## 2014-02-17 NOTE — Progress Notes (Signed)
Pt. Has his CPAP & mask from home. Pt. Places himself on & off CPAP by himself.
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
## 2014-02-18 ENCOUNTER — Inpatient Hospital Stay (HOSPITAL_COMMUNITY): Payer: Self-pay | Admitting: *Deleted

## 2014-02-18 ENCOUNTER — Encounter (HOSPITAL_COMMUNITY): Payer: Self-pay

## 2014-02-18 DIAGNOSIS — I10 Essential (primary) hypertension: Secondary | ICD-10-CM

## 2014-02-18 DIAGNOSIS — S329XXA Fracture of unspecified parts of lumbosacral spine and pelvis, initial encounter for closed fracture: Secondary | ICD-10-CM | POA: Diagnosis not present

## 2014-02-18 DIAGNOSIS — S72009B Fracture of unspecified part of neck of unspecified femur, initial encounter for open fracture type I or II: Secondary | ICD-10-CM | POA: Diagnosis not present

## 2014-02-18 DIAGNOSIS — I2510 Atherosclerotic heart disease of native coronary artery without angina pectoris: Secondary | ICD-10-CM | POA: Diagnosis not present

## 2014-02-18 LAB — PROTIME-INR
INR: 2.22 — ABNORMAL HIGH (ref 0.00–1.49)
Prothrombin Time: 24.6 seconds — ABNORMAL HIGH (ref 11.6–15.2)

## 2014-02-18 MED ORDER — ALPRAZOLAM 0.25 MG PO TABS
0.2500 mg | ORAL_TABLET | Freq: Two times a day (BID) | ORAL | Status: DC | PRN
  Administered 2014-02-19 – 2014-02-21 (×2): 0.25 mg via ORAL
  Filled 2014-02-18 (×3): qty 1

## 2014-02-18 MED ORDER — METHOCARBAMOL 500 MG PO TABS
1000.0000 mg | ORAL_TABLET | ORAL | Status: DC | PRN
  Administered 2014-02-18 – 2014-02-21 (×10): 1000 mg via ORAL
  Filled 2014-02-18 (×11): qty 2

## 2014-02-18 MED ORDER — WARFARIN SODIUM 7.5 MG PO TABS
7.5000 mg | ORAL_TABLET | Freq: Once | ORAL | Status: AC
  Administered 2014-02-18: 7.5 mg via ORAL
  Filled 2014-02-18: qty 1
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
## 2014-02-18 NOTE — Progress Notes (Signed)
Social Work Patient ID: Joseph [name], male   DOB: [birth year], 48 y.o.   MRN: [id number] Have contacted the adjuster-Lori to begin the process of obtaining pt's DME for home.  Awaiting her return call.

## 2014-02-18 NOTE — Progress Notes (Signed)
ANTICOAGULATION CONSULT NOTE - Follow Up Consult  Pharmacy Consult for coumadin Indication: VTE prophylaxis  Allergies  Allergen Reactions  . Penicillins Other (See Comments)    Unknown  . Tetanus Toxoid Other (See Comments)    REACTION: unknown    Patient Measurements: Weight: 292 lb 15.9 oz (132.9 kg) Heparin Dosing Weight:   Vital Signs: Temp: 97.3 F (36.3 C) (09/01 0559) Temp src: Oral (09/01 0559) BP: 118/64 mmHg (09/01 0752) Pulse Rate: 94 (09/01 0559)  Labs:  Recent Labs  02/16/14 0601 02/17/14 0830 02/18/14 0705  LABPROT 26.7* 26.1* 24.6*  INR 2.46* 2.39* 2.22*    The CrCl is unknown because both a height and weight (above a minimum accepted value) are required for this calculation.   Medications:  Scheduled:  . finasteride  5 mg Oral Daily  . folic acid  1 mg Oral Daily  . iron polysaccharides  150 mg Oral BID AC  . levothyroxine  100 mcg Oral QAC breakfast  . lidocaine  1 patch Transdermal Q24H  . lisinopril  10 mg Oral Daily  . metoprolol succinate  25 mg Oral Daily  . multivitamin with minerals  1 tablet Oral Daily  . OxyCODONE  20 mg Oral Q12H  . polyethylene glycol  17 g Oral BID  . simvastatin  5 mg Oral q1800  . sodium phosphate  1 enema Rectal Once  . thiamine  100 mg Oral Daily  . ticagrelor  90 mg Oral BID  . Warfarin - Pharmacist Dosing Inpatient   Does not apply q1800   Infusions:    Assessment: 48 yo male s/p ortho surgery is currently on therapeutic coumadin.  INR today is 2.2 from 2.39. Goal of Therapy:  INR 2-3 Monitor platelets by anticoagulation protocol: Yes   Plan:  - coumadin 7.5mg  po x1 - INR in am  Brailyn Killion, Tsz-Yin 02/18/2014,8:31 AM

## 2014-02-18 NOTE — Progress Notes (Signed)
Pt placed his self on CPAP

## 2014-02-18 NOTE — Progress Notes (Signed)
Occupational Therapy Session Note  Patient Details  Name: Joseph [name] MRN: [id number] Date of Birth: 1965/08/04  Today's Date: 02/18/2014 OT Individual Time: 0800-0900 OT Individual Time Calculation (min): 60 min    Short Term Goals: Week 1:  OT Short Term Goal 1 (Week 1): Pt will be able to transfer to [facility] with [facility]. OT Short Term Goal 2 (Week 1): Pt will be able to stand with min A to RW to complete LB dressing. OT Short Term Goal 3 (Week 1): Pt will be able to don pants using reacher and pull them over his hips with min A. OT Short Term Goal 4 (Week 1): Pt will be able to bathe LB with min A.  OT Short Term Goal 5 (Week 1): Pt and pt's wife will be able to complete the [facility] transfers with min A to allow her to assist him as needed.  Skilled Therapeutic Interventions/Progress Updates:    Pt seated on [facility] with wife present.  Pt's wife assisted with toilet hygiene prior to amb with RW to shower for bathing tasks.  Pt completed bathing and dressing tasks using AE appropriately throughout.  Pt stated that he was exhausted because he had not been sleeping since admission to hospital.  Pt's wife applied ace wraps on BLE.  Focus on activity tolerance, safety awareness, sit<>stand, standing balance, and functional amb with RW.  Pt adheres to [facility] precautions independently.  Therapy Documentation Precautions:  Precautions Precautions: Posterior Hip;Fall Precaution Booklet Issued: Yes (comment) Precaution Comments: recalled 3/3 precautions Restrictions Weight Bearing Restrictions: Yes RLE Weight Bearing: Weight bearing as tolerated LLE Weight Bearing: Touchdown weight bearing Pain: Pain Assessment Pain Assessment: 0-10 Pain Score: 3  Pain Type: Acute pain Pain Location: Hip Pain Orientation: Left Pain Descriptors / Indicators: Aching Pain Frequency: Intermittent Pain Onset: On-going Pain Intervention(s):RN aware; Repositioned ADL: ADL ADL Comments: refer to FIM  See  FIM for current functional status  Therapy/Group: Individual Therapy  Leroy Libman 02/18/2014, 9:52 AM

## 2014-02-19 ENCOUNTER — Encounter (HOSPITAL_COMMUNITY): Payer: Self-pay | Admitting: Occupational Therapy

## 2014-02-19 ENCOUNTER — Inpatient Hospital Stay (HOSPITAL_COMMUNITY): Payer: Self-pay | Admitting: Physical Therapy

## 2014-02-19 ENCOUNTER — Inpatient Hospital Stay (HOSPITAL_COMMUNITY): Payer: Self-pay | Admitting: Occupational Therapy

## 2014-02-19 ENCOUNTER — Inpatient Hospital Stay (HOSPITAL_COMMUNITY): Payer: Worker's Compensation

## 2014-02-19 DIAGNOSIS — S72009B Fracture of unspecified part of neck of unspecified femur, initial encounter for open fracture type I or II: Secondary | ICD-10-CM | POA: Diagnosis not present

## 2014-02-19 DIAGNOSIS — I2510 Atherosclerotic heart disease of native coronary artery without angina pectoris: Secondary | ICD-10-CM | POA: Diagnosis not present

## 2014-02-19 DIAGNOSIS — S329XXA Fracture of unspecified parts of lumbosacral spine and pelvis, initial encounter for closed fracture: Secondary | ICD-10-CM | POA: Diagnosis not present

## 2014-02-19 LAB — PROTIME-INR
INR: 2.24 — ABNORMAL HIGH (ref 0.00–1.49)
Prothrombin Time: 24.8 seconds — ABNORMAL HIGH (ref 11.6–15.2)

## 2014-02-19 MED ORDER — LORATADINE 10 MG PO TABS
10.0000 mg | ORAL_TABLET | Freq: Every day | ORAL | Status: DC
  Administered 2014-02-19 – 2014-02-21 (×3): 10 mg via ORAL
  Filled 2014-02-19 (×5): qty 1

## 2014-02-19 MED ORDER — WARFARIN SODIUM 5 MG PO TABS
5.0000 mg | ORAL_TABLET | Freq: Once | ORAL | Status: AC
  Administered 2014-02-19: 5 mg via ORAL
  Filled 2014-02-19: qty 1

## 2014-02-19 MED ORDER — FLUOCINONIDE 0.05 % EX CREA
TOPICAL_CREAM | Freq: Three times a day (TID) | CUTANEOUS | Status: DC
  Administered 2014-02-19 (×2): via TOPICAL
  Administered 2014-02-20: 1 via TOPICAL
  Administered 2014-02-20: 18:00:00 via TOPICAL
  Administered 2014-02-20: 1 via TOPICAL
  Administered 2014-02-21: 08:00:00 via TOPICAL
  Filled 2014-02-19 (×3): qty 30

## 2014-02-19 MED ORDER — FLUOCINONIDE 0.05 % EX OINT
1.0000 "application " | TOPICAL_OINTMENT | Freq: Three times a day (TID) | CUTANEOUS | Status: DC
  Filled 2014-02-19: qty 15

## 2014-02-19 MED ORDER — OXYCODONE HCL ER 10 MG PO T12A
20.0000 mg | EXTENDED_RELEASE_TABLET | Freq: Two times a day (BID) | ORAL | Status: DC
  Administered 2014-02-19 – 2014-02-21 (×4): 20 mg via ORAL
  Filled 2014-02-19 (×4): qty 2

## 2014-02-19 MED ORDER — PRO-STAT SUGAR FREE PO LIQD
30.0000 mL | Freq: Three times a day (TID) | ORAL | Status: DC
  Administered 2014-02-19 – 2014-02-21 (×7): 30 mL via ORAL
  Filled 2014-02-19 (×9): qty 30

## 2014-02-19 NOTE — Progress Notes (Signed)
Nutrition Brief Note  Patient identified on the Malnutrition Screening Tool (MST) Report  Wt Readings from Last 15 Encounters:  02/18/14 286 lb 11.2 oz (130.046 kg)  02/11/14 294 lb 1.5 oz (133.4 kg)  02/11/14 294 lb 1.5 oz (133.4 kg)  05/28/13 276 lb 11.2 oz (125.51 kg)  05/28/13 276 lb 11.2 oz (125.51 kg)  11/29/10 270 lb (122.471 kg)  06/01/10 260 lb (117.935 kg)  12/03/09 255 lb (115.667 kg)  09/08/09 263 lb (119.296 kg)    Body mass index is 38.87 kg/(m^2). Patient meets criteria for class II obesity based on current BMI.   Current diet order is regular, patient is consuming approximately 100% of meals at this time. Labs and medications reviewed.   No nutrition interventions warranted at this time. If nutrition issues arise, please consult RD.   Kallie Locks, MS, Provisional LDN Pager # [phone] After hours/ weekend pager # [phone]

## 2014-02-19 NOTE — Progress Notes (Addendum)
Physical Therapy Session Note  Patient Details  Name: Joseph [name] MRN: [id number] Date of Birth: 1966-02-26  Today's Date: 02/19/2014 PT Individual Time: 9242-6834 PT Individual Time Calculation (min): 64 min   Short Term Goals: Week 2:  PT Short Term Goal 1 (Week 2): STG-LTG due to LOS  Skilled Therapeutic Interventions/Progress Updates:   Pt L hip incision with spotty erythema, and pt's entire LLE itching. Pt received Benadryl during tx, which was effective.  D/c set for 02/21/14.  Ramp has been completed at home.  Family ed with wife [name] for gait, HEP including self stretching with wife, safety concerns with RW and w/c, fall recovery (do not attempt due to hip precautions, call 911).     Gait training with RW x 55' on level terrain with supervision,  up/down ramp with supervision, up/down curb with min guard assist, ascending backwards.  NuStep at level 4 x 17 minutes focusing on L hip flexibility within hip precautions.  W/c propulsion x 150' modified independent x 2 for activity tolerance, no longer needing a rest break each way.    Therapy Documentation Precautions:  Precautions Precautions: Posterior Hip;Fall Precaution Booklet Issued: Yes (comment) Precaution Comments: recalled 3/3 precautions Restrictions Weight Bearing Restrictions: Yes RLE Weight Bearing: Weight bearing as tolerated LLE Weight Bearing: Touchdown weight bearing   Pain: Pain Assessment Pain Score: 2  (at rest, 6/10 L hip with activity) Pain Location: Hip Pain Orientation: Left Pain Onset: With Activity Pain Intervention(s): Medication (See eMAR)   Locomotion : Ambulation Ambulation/Gait Assistance: 5: Supervision Wheelchair Mobility Distance: 150      See FIM for current functional status  Therapy/Group: Individual Therapy  Azayla Polo 02/19/2014, 4:20 PM

## 2014-02-19 NOTE — Progress Notes (Signed)
Physical Therapy Session Note  Patient Details  Name: Joseph [name] MRN: [id number] Date of Birth: 03/12/1966  Today's Date: 02/19/2014 PT Individual Time: 0901-0957 PT Individual Time Calculation (min): 56 min   Short Term Goals: Week 1:  PT Short Term Goal 1 (Week 1): pt will move supine> sit with assistance of 1 person PT Short Term Goal 1 - Progress (Week 1): Met PT Short Term Goal 2 (Week 1): pt will move sit> supine with supervision, with use of adaptive equipment PRN PT Short Term Goal 2 - Progress (Week 1): Met PT Short Term Goal 3 (Week 1): pt will transfer bed>< w/c with close supervision PT Short Term Goal 3 - Progress (Week 1): Met PT Short Term Goal 4 (Week 1): pt will propel w/c x 150' with supervision including carpet, slight incline PT Short Term Goal 4 - Progress (Week 1): Met PT Short Term Goal 5 (Week 1): pt will perform gait x 50' with min assist PT Short Term Goal 5 - Progress (Week 1): Met  Skilled Therapeutic Interventions/Progress Updates:   Pt resting in w/c with wife present applying lotion to lower RLE; pt reporting skin irritation under ACE wrap.  Discussed with OT and RN application of anti-itch cream or powder under ACE wrap to minimize irritation.  Pt also requesting long acting pain medication prior to beginning therapy.  While awaiting pain medication discussed with pt and wife D/C plan, home set up, education completed thus far and what education needing to be completed; also discussed earlier D/C secondary to pt progress.  Pt willing to D/C at end of week if team agrees.  Unable to practice real car transfer secondary to wife not having it here.  Pt agreed to try simulated truck again after gait outside to simulate sidewalk at home.  Pt transported outside in w/c total A.  Performed gait training over uneven paved surfaces with RW x 100' x 2 with supervision and extra time; no evidence of instability or unsafe use of RW.  Pt also performed sit <> stand stand  pivot transfers from low bench with arm rests and supervision with extra time with good attention to hip precautions.  Back on unit performed simulated truck transfer with supervision with pt able to bring LE into and out of "truck" with extra time and supervision.  [name] with pt propelling w/c supervision x 100'.  OT present and requesting to start B&D session now to allow another pt to rest.  Pt agreeable.    Therapy Documentation Precautions:  Precautions Precautions: Posterior Hip;Fall Precaution Booklet Issued: Yes (comment) Precaution Comments: recalled 3/3 precautions Restrictions Weight Bearing Restrictions: Yes RLE Weight Bearing: Weight bearing as tolerated LLE Weight Bearing: Touchdown weight bearing Pain:  Pt reporting 2/3 overall at rest but requested long acting pain medication prior to therapy; by end of session pt reporting 6/10 ankle pain and increasing hip pain.  Unable to ice secondary to pt beginning OT B&D session early.  Locomotion : Ambulation Ambulation/Gait Assistance: 5: Supervision Wheelchair Mobility Distance: 150   See FIM for current functional status  Therapy/Group: Individual Therapy  Hall, Audra Faucette 02/19/2014, 10:55 AM  

## 2014-02-19 NOTE — Progress Notes (Signed)
Recreational Therapy Session Note  Patient Details  Name: Bradly Sangiovanni MRN: [id number] Date of Birth: Oct 19, 1965 Today's Date: 02/19/2014 LATE ENTRY form 02/17/14 Met with pt to discuss TR services.  Pt with limited interest in TR services as pt reports he worked all the time & did not have time for hobbies/leisure.  Discussion with pt about identifying why he found satisfaction in his work & asked probing question to assist pt with problem solving through potential options for leisure participation post discharge.  No further TR implemented. Logan Baltimore 02/19/2014, 4:27 PM
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
## 2014-02-19 NOTE — Progress Notes (Signed)
Occupational Therapy Session Note  Patient Details  Name: Joseph [name] MRN: [id number] Date of Birth: [birth year]  Today's Date: 02/19/2014 OT Individual Time: 1224-4975 OT Individual Time Calculation (min): 46 min    Short Term Goals: Week 1:  OT Short Term Goal 1 (Week 1): Pt will be able to transfer to [facility] with min A w/c to [facility]. OT Short Term Goal 2 (Week 1): Pt will be able to stand with min A to RW to complete LB dressing. OT Short Term Goal 3 (Week 1): Pt will be able to don pants using reacher and pull them over his hips with min A. OT Short Term Goal 4 (Week 1): Pt will be able to bathe LB with min A.  OT Short Term Goal 5 (Week 1): Pt and pt's wife will be able to complete the [facility] transfers with min A to allow her to assist him as needed.  All STGs met.  Skilled Therapeutic Interventions/Progress Updates:    Pt seen for BADL retraining with a focus on pt using AE to complete tasks as independently as possible. Pt met all STGs and has met several LTGs. Pt and his wife feel competent and ready for him to go home sooner than his planned discharge date.  Pt is now supervision to mod I with his self care skills and mobility and would be ready to go home Friday.    Therapy Documentation Precautions:  Precautions Precautions: Posterior Hip;Fall Precaution Booklet Issued: Yes (comment) Precaution Comments: recalled 3/3 precautions Restrictions Weight Bearing Restrictions: Yes RLE Weight Bearing: Weight bearing as tolerated LLE Weight Bearing: Touchdown weight bearing   Pain: Pain Assessment Pain Score: 3  Pain Location: Hip Pain Orientation: Left ADL: ADL ADL Comments: refer to FIM  See FIM for current functional status  Therapy/Group: Individual Therapy  [place] 02/19/2014, 12:10 PM

## 2014-02-19 NOTE — Progress Notes (Signed)
Social Work Patient ID: Joseph [name], male   DOB: 1966-06-19, 48 y.o.   MRN: [id number] Met with pt and wife to inform of team conference progression toward goals and discharge 9/4, since meeting goals sooner. Both pleased with his progress and his pain is more managed now.  Discussed have made referral for DME and Home health, asked to have DME Delivered to pt's room to make sure it is correct.  Wife aware she may receive a call regarding the equipment.
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
## 2014-02-19 NOTE — Progress Notes (Signed)
Chart and note reviewed. Agree with note.  Princes Finger, RD, LDN, CNSC Pager [phone] After Hours Pager [phone]   
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
## 2014-02-19 NOTE — Progress Notes (Signed)
ANTICOAGULATION CONSULT NOTE - Follow Up Consult  Pharmacy Consult for coumadin Indication: VTE prophylaxis  Allergies  Allergen Reactions  . Penicillins Other (See Comments)    Unknown  . Tetanus Toxoid Other (See Comments)    REACTION: unknown    Patient Measurements: Weight: 286 lb 11.2 oz (130.046 kg) Heparin Dosing Weight:   Vital Signs: Temp: 98.3 F (36.8 C) (09/02 0635) Temp src: Oral (09/02 0635) BP: 121/55 mmHg (09/02 0635) Pulse Rate: 80 (09/02 0635)  Labs:  Recent Labs  02/17/14 0830 02/18/14 0705 02/19/14 0625  LABPROT 26.1* 24.6* 24.8*  INR 2.39* 2.22* 2.24*    The CrCl is unknown because both a height and weight (above a minimum accepted value) are required for this calculation.   Medications:  Scheduled:  . finasteride  5 mg Oral Daily  . folic acid  1 mg Oral Daily  . iron polysaccharides  150 mg Oral BID AC  . levothyroxine  100 mcg Oral QAC breakfast  . lidocaine  1 patch Transdermal Q24H  . lisinopril  10 mg Oral Daily  . metoprolol succinate  25 mg Oral Daily  . multivitamin with minerals  1 tablet Oral Daily  . OxyCODONE  20 mg Oral Q12H  . polyethylene glycol  17 g Oral BID  . simvastatin  5 mg Oral q1800  . sodium phosphate  1 enema Rectal Once  . thiamine  100 mg Oral Daily  . ticagrelor  90 mg Oral BID  . Warfarin - Pharmacist Dosing Inpatient   Does not apply q1800   Infusions:    Assessment: 48 yo male s/p ortho surgery is currently on therapeutic coumadin.  INR today is 2.24. Goal of Therapy:  INR 2-3 Monitor platelets by anticoagulation protocol: Yes   Plan:  - Warfarin 5 mg po x1 (patient may need 7.5mg  few days a week TThSa and 5 mg other days) - INR daily, watch H/H - Monitor for s/sx of bleeding    Geralynn Capri, Tsz-Yin 02/19/2014,8:12 AM

## 2014-02-20 ENCOUNTER — Encounter (HOSPITAL_COMMUNITY): Payer: Self-pay | Admitting: Occupational Therapy

## 2014-02-20 ENCOUNTER — Inpatient Hospital Stay (HOSPITAL_COMMUNITY): Payer: Worker's Compensation

## 2014-02-20 ENCOUNTER — Telehealth: Payer: Self-pay

## 2014-02-20 ENCOUNTER — Inpatient Hospital Stay (HOSPITAL_COMMUNITY): Payer: Self-pay | Admitting: Physical Therapy

## 2014-02-20 DIAGNOSIS — D62 Acute posthemorrhagic anemia: Secondary | ICD-10-CM | POA: Diagnosis present

## 2014-02-20 DIAGNOSIS — I2510 Atherosclerotic heart disease of native coronary artery without angina pectoris: Secondary | ICD-10-CM | POA: Diagnosis not present

## 2014-02-20 DIAGNOSIS — I10 Essential (primary) hypertension: Secondary | ICD-10-CM

## 2014-02-20 DIAGNOSIS — S72009B Fracture of unspecified part of neck of unspecified femur, initial encounter for open fracture type I or II: Secondary | ICD-10-CM

## 2014-02-20 DIAGNOSIS — S329XXA Fracture of unspecified parts of lumbosacral spine and pelvis, initial encounter for closed fracture: Secondary | ICD-10-CM

## 2014-02-20 LAB — OCCULT BLOOD X 1 CARD TO LAB, STOOL: Fecal Occult Bld: NEGATIVE

## 2014-02-20 LAB — BASIC METABOLIC PANEL
ANION GAP: 14 (ref 5–15)
BUN: 14 mg/dL (ref 6–23)
CHLORIDE: 100 meq/L (ref 96–112)
CO2: 27 mEq/L (ref 19–32)
Calcium: 8.7 mg/dL (ref 8.4–10.5)
Creatinine, Ser: 0.89 mg/dL (ref 0.50–1.35)
GFR calc non Af Amer: >90 mL/min (ref >90)
Glucose, Bld: 132 mg/dL — ABNORMAL HIGH (ref 70–99)
Potassium: 4.2 mEq/L (ref 3.7–5.3)
SODIUM: 141 meq/L (ref 137–147)

## 2014-02-20 LAB — PROTIME-INR
INR: 2.35 — AB (ref 0.00–1.49)
Prothrombin Time: 25.7 seconds — ABNORMAL HIGH (ref 11.6–15.2)

## 2014-02-20 LAB — CBC
HCT: 32.8 % — ABNORMAL LOW (ref 39.0–52.0)
HEMOGLOBIN: 10.4 g/dL — AB (ref 13.0–17.0)
MCH: 30.8 pg (ref 26.0–34.0)
MCHC: 31.7 g/dL (ref 30.0–36.0)
MCV: 97 fL (ref 78.0–100.0)
Platelets: 375 10*3/uL (ref 150–400)
RBC: 3.38 MIL/uL — ABNORMAL LOW (ref 4.22–5.81)
RDW: 14.6 % (ref 11.5–15.5)
WBC: 8.6 10*3/uL (ref 4.0–10.5)

## 2014-02-20 MED ORDER — DIPHENHYDRAMINE HCL 25 MG PO CAPS
50.0000 mg | ORAL_CAPSULE | Freq: Three times a day (TID) | ORAL | Status: DC
  Administered 2014-02-20 – 2014-02-21 (×5): 50 mg via ORAL
  Filled 2014-02-20: qty 1
  Filled 2014-02-20: qty 2
  Filled 2014-02-20: qty 1
  Filled 2014-02-20 (×3): qty 2
  Filled 2014-02-20: qty 1
  Filled 2014-02-20: qty 2
  Filled 2014-02-20: qty 1

## 2014-02-20 MED ORDER — WARFARIN SODIUM 7.5 MG PO TABS
7.5000 mg | ORAL_TABLET | Freq: Once | ORAL | Status: AC
  Administered 2014-02-20: 7.5 mg via ORAL
  Filled 2014-02-20: qty 1
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
## 2014-02-20 NOTE — Progress Notes (Signed)
ANTICOAGULATION CONSULT NOTE - Follow Up Consult  Pharmacy Consult for coumadin Indication: VTE prophylaxis  Allergies  Allergen Reactions  . Penicillins Other (See Comments)    Unknown  . Tetanus Toxoid Other (See Comments)    REACTION: unknown    Patient Measurements: Weight: 280 lb 1.6 oz (127.053 kg) Heparin Dosing Weight:   Vital Signs: Temp: 98.1 F (36.7 C) (09/03 0549) Temp src: Oral (09/03 0549) BP: 116/70 mmHg (09/03 0549) Pulse Rate: 80 (09/03 0549)  Labs:  Recent Labs  02/17/14 0830 02/18/14 0705 02/19/14 0625  LABPROT 26.1* 24.6* 24.8*  INR 2.39* 2.22* 2.24*    The CrCl is unknown because both a height and weight (above a minimum accepted value) are required for this calculation.   Medications:  Scheduled:  . feeding supplement (PRO-STAT SUGAR FREE 64)  30 mL Oral TID WC  . finasteride  5 mg Oral Daily  . fluocinonide cream   Topical TID  . folic acid  1 mg Oral Daily  . iron polysaccharides  150 mg Oral BID AC  . levothyroxine  100 mcg Oral QAC breakfast  . lidocaine  1 patch Transdermal Q24H  . lisinopril  10 mg Oral Daily  . loratadine  10 mg Oral Daily  . metoprolol succinate  25 mg Oral Daily  . multivitamin with minerals  1 tablet Oral Daily  . OxyCODONE  20 mg Oral Q12H  . polyethylene glycol  17 g Oral BID  . simvastatin  5 mg Oral q1800  . sodium phosphate  1 enema Rectal Once  . thiamine  100 mg Oral Daily  . ticagrelor  90 mg Oral BID  . Warfarin - Pharmacist Dosing Inpatient   Does not apply q1800   Infusions:    Assessment: 48 yo male s/p ortho surgery is currently on therapeutic coumadin.  INR today is 2.35. Goal of Therapy:  INR 2-3 Monitor platelets by anticoagulation protocol: Yes   Plan:  - Warfarin 7.5 mg po x1 (patient may need 7.5mg  few days a week TThSa and 5 mg other days) - INR daily, watch H/H - Monitor for s/sx of bleeding   Janneth Krasner, Tsz-Yin 02/20/2014,8:21 AM
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
## 2014-02-20 NOTE — Progress Notes (Signed)
Placed patient on home CPAP for the night  
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
## 2014-02-20 NOTE — Progress Notes (Signed)
Patient has home CPAP in his room that he places on/off himself. 

## 2014-02-20 NOTE — Progress Notes (Signed)
Physical Therapy Session Note  Patient Details  Name: Joseph [name] MRN: [id number] Date of Birth: 1965-07-10  Today's Date: 02/20/2014 PT Individual Time: 1015-1115 PT Individual Time Calculation (min): 60 min   Short Term Goals: Week 2:  PT Short Term Goal 1 (Week 2): STG-LTG due to LOS  Skilled Therapeutic Interventions/Progress Updates:   Pt received sitting in w/c, agreeable to therapy. W/c mobility using BUEs 2 x 150 ft, mod I. Pt performed HEP for supine, seated, and standing with RW for UE support as prescribed by primary PT, with instructional cues for technique as needed. Pt transferred supine <> sit with use of leg lifter and mod I, extra time. Gait training using RW x 60 ft, supervision. Pt negotiated up/down 3% grade with RW and supervision. Pt returned to room and left sitting in w/c with RN and family present.   Therapy Documentation Precautions:  Precautions Precautions: Posterior Hip;Fall Precaution Booklet Issued: Yes (comment) Precaution Comments: recalled 3/3 precautions Restrictions Weight Bearing Restrictions: Yes RLE Weight Bearing: Weight bearing as tolerated LLE Weight Bearing: Touchdown weight bearing Pain: Pain Assessment Pain Score: 4  Pain Type: Acute pain Pain Location: Hip Pain Orientation: Left Pain Descriptors / Indicators: Aching Pain Intervention(s):  (premedicated)  See FIM for current functional status  Therapy/Group: Individual Therapy  Laretta Alstrom 02/20/2014, 11:13 AM

## 2014-02-20 NOTE — Progress Notes (Signed)
Occupational Therapy Session Note  Patient Details  Name: Joseph [name] MRN: [id number] Date of Birth: 07/04/1965  Today's Date: 02/20/2014 OT Individual Time: 0800-0900 OT Individual Time Calculation (min): 60 min    Short Term Goals: Week 1:  OT Short Term Goal 1 (Week 1): Pt will be able to transfer to [facility] with min A w/c to [facility]. OT Short Term Goal 2 (Week 1): Pt will be able to stand with min A to RW to complete LB dressing. OT Short Term Goal 3 (Week 1): Pt will be able to don pants using reacher and pull them over his hips with min A. OT Short Term Goal 4 (Week 1): Pt will be able to bathe LB with min A.  OT Short Term Goal 5 (Week 1): Pt and pt's wife will be able to complete the [facility] transfers with min A to allow her to assist him as needed.  Skilled Therapeutic Interventions/Progress Updates:    Pt seen for ADL retraining of B/D with a focus on pt completing tasks at a mod I level. Pt's wife present and is independent with A her spouse with shower transfers in the room and to don his TED hose.  Pt stood and ambulated to bathroom with mod I with RW and then stepped into shower with Supervision. He stated he has practiced tub transfers 3x and he and is wife are comfortable with tub bench transfers providing Supervision.  Pt completed B/D using AE. Reviewed UE HEP of shoulder and chest presses with dumbells he has at home. Pt worked on activity tolerance with ambulation with RW for 100 feet with supervision due to increased distance. He is able to ambulate in bathroom for short distances with RW with mod I. Pt resting in w/c with all needs met.    Therapy Documentation Precautions:  Precautions Precautions: Posterior Hip;Fall Precaution Booklet Issued: Yes (comment) Precaution Comments: recalled 3/3 precautions Restrictions Weight Bearing Restrictions: Yes RLE Weight Bearing: Weight bearing as tolerated LLE Weight Bearing: Touchdown weight bearing Therapy Vitals Temp: 98.1 F  (36.7 C) Temp src: Oral Pulse Rate: 80 Resp: 18 BP: 116/70 mmHg Patient Position (if appropriate): Lying Oxygen Therapy SpO2: 96 % O2 Device: None (Room air) Pain: Pain Assessment Pain Score: 3  Pain Type: Acute pain Pain Location: Hip Pain Orientation: Left Pain Descriptors / Indicators: Aching Pain Intervention(s):  (premedicated) ADL: ADL ADL Comments: refer to FIM  See FIM for current functional status  Therapy/Group: Individual Therapy  Lily Lake 02/20/2014, 9:27 AM
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
## 2014-02-20 NOTE — Progress Notes (Signed)
Social Work Patient ID: Joseph [name], male   DOB: 18-May-1966, 48 y.o.   MRN: [id number] Continuing to work on DME and seem to be getting the run around regarding reason he needs the equipment.  Have contacted June-RN Case manager and Laurie-Adjuster. Both are aware if his equipment is not here than he is not going home tomorrow.  Both in agreement, he has to have his equipment to go home.  See if can deliver here by tomorrow.

## 2014-02-20 NOTE — Telephone Encounter (Signed)
Heather @ [facility] is requesting a call back. She is needing a RX for multiple medical supplies for th patient. [phone].

## 2014-02-21 DIAGNOSIS — S329XXA Fracture of unspecified parts of lumbosacral spine and pelvis, initial encounter for closed fracture: Secondary | ICD-10-CM | POA: Diagnosis not present

## 2014-02-21 DIAGNOSIS — I2510 Atherosclerotic heart disease of native coronary artery without angina pectoris: Secondary | ICD-10-CM | POA: Diagnosis not present

## 2014-02-21 DIAGNOSIS — S72009B Fracture of unspecified part of neck of unspecified femur, initial encounter for open fracture type I or II: Secondary | ICD-10-CM | POA: Diagnosis not present

## 2014-02-21 LAB — PROTIME-INR
INR: 2.51 — AB (ref 0.00–1.49)
PROTHROMBIN TIME: 27.1 s — AB (ref 11.6–15.2)

## 2014-02-21 MED ORDER — POLYETHYLENE GLYCOL 3350 17 G PO PACK: 17.0000 g | PACK | Freq: Two times a day (BID) | ORAL | Status: DC

## 2014-02-21 MED ORDER — LIDOCAINE 5 % EX PTCH: MEDICATED_PATCH | CUTANEOUS | Status: DC

## 2014-02-21 MED ORDER — METHOCARBAMOL 500 MG PO TABS: 500.0000 mg | ORAL_TABLET | Freq: Four times a day (QID) | ORAL | Status: DC | PRN

## 2014-02-21 MED ORDER — FLUOCINONIDE 0.05 % EX CREA: TOPICAL_CREAM | Freq: Three times a day (TID) | CUTANEOUS | Status: DC

## 2014-02-21 MED ORDER — WARFARIN SODIUM 5 MG PO TABS
5.0000 mg | ORAL_TABLET | Freq: Once | ORAL | Status: DC
  Filled 2014-02-21: qty 1

## 2014-02-21 MED ORDER — ALPRAZOLAM 0.25 MG PO TABS: 0.2500 mg | ORAL_TABLET | Freq: Two times a day (BID) | ORAL | Status: DC | PRN

## 2014-02-21 MED ORDER — OXYCODONE HCL 10 MG PO TABS: 10.0000 mg | ORAL_TABLET | Freq: Four times a day (QID) | ORAL | Status: DC | PRN

## 2014-02-21 MED ORDER — ASPIRIN EC 81 MG PO TBEC: 81.0000 mg | DELAYED_RELEASE_TABLET | Freq: Every day | ORAL | Status: DC

## 2014-02-21 MED ORDER — WARFARIN SODIUM 10 MG PO TABS: ORAL_TABLET | ORAL | Status: DC

## 2014-02-21 MED ORDER — DIPHENHYDRAMINE HCL 25 MG PO CAPS
50.0000 mg | ORAL_CAPSULE | ORAL | Status: AC
  Administered 2014-02-21: 50 mg via ORAL

## 2014-02-21 MED ORDER — ASPIRIN EC 81 MG PO TBEC: DELAYED_RELEASE_TABLET | ORAL | Status: DC

## 2014-02-21 MED ORDER — OXYCODONE HCL ER 20 MG PO T12A: 20.0000 mg | EXTENDED_RELEASE_TABLET | Freq: Two times a day (BID) | ORAL | Status: DC

## 2014-02-21 MED ORDER — POLYSACCHARIDE IRON COMPLEX 150 MG PO CAPS: 150.0000 mg | ORAL_CAPSULE | Freq: Two times a day (BID) | ORAL | Status: DC

## 2014-02-21 MED ORDER — ADULT MULTIVITAMIN W/MINERALS CH: 1.0000 | ORAL_TABLET | Freq: Every day | ORAL | Status: DC

## 2014-02-21 MED ORDER — CAMPHOR-MENTHOL 0.5-0.5 % EX LOTN: 1.0000 "application " | TOPICAL_LOTION | Freq: Three times a day (TID) | CUTANEOUS | Status: DC

## 2014-02-21 MED ORDER — LEVOCETIRIZINE DIHYDROCHLORIDE 5 MG PO TABS: 5.0000 mg | ORAL_TABLET | Freq: Every evening | ORAL | Status: DC

## 2014-02-21 MED ORDER — FOLIC ACID 1 MG PO TABS: 1.0000 mg | ORAL_TABLET | Freq: Every day | ORAL | Status: DC

## 2014-02-21 MED ORDER — HYDROXYZINE HCL 10 MG PO TABS: 10.0000 mg | ORAL_TABLET | Freq: Three times a day (TID) | ORAL | Status: DC | PRN

## 2014-02-21 NOTE — Progress Notes (Signed)
Pt discharged home with wife. Discharge instructions provided by Algis Liming, PA. All questions answered. Wife verbalized understanding. Pt escorted off unit in w/c with personal belonging by Jocelyn Lamer, [place].

## 2014-02-21 NOTE — Progress Notes (Signed)
Social Work Patient ID: Verner Mould, male   DOB: 03-01-1966, 48 y.o.   MRN: [id number] Have spoken with laurie-adjuster and have had a conference call with [facility] who have referred home health follow up to [place] and have yet to hear form them after five Phone calls.  Unsure if home health arranged and they are aware pt will need a blood draw on Monday or Tuesday to check his coumadin level.  Will continue to work on this. Will contact wife to let her know what is going on and have left a message to June Womble case manager who is out of the office until Tuesday.
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
## 2014-02-21 NOTE — Telephone Encounter (Signed)
Left VM for Heather to call us back.  I informed her of our hours and that we will be closed on Monday and her voicemail states she will be out of the office until Tuesday as well.

## 2014-02-21 NOTE — Progress Notes (Signed)
Social Work Discharge Note Discharge Note  The overall goal for the admission was met for:   Discharge location: [place] LEVEL  Length of Stay: Yes-10 DAYS  Discharge activity level: Yes-SUPERVISION LEVEL  Home/community participation: Yes  Services provided included: MD, RD, PT, OT, RN, CM, Pharmacy, Neuropsych and SW  Financial Services: Worker's Comp  Follow-up services arranged: Home Health: WORKERS [facility] SETTING UP-PT & RN, DME: [employer]-WHEELCHAIR, ROLLING WALKER, DROP-ARM BSC, TUB BENCH and Patient/Family request agency HH: [facility] PREF, DME: [facility] PREF  Comments (or additional information):EDUCATION COMPLETED WITH WIFE. WORKERS COMP LOOKING INTO HH ISSUE NO AGENCY CONTACT AS OF DC.    Patient/Family verbalized understanding of follow-up arrangements: Yes  Individual responsible for coordination of the follow-up plan: SELF & WIFE-[name]  Confirmed correct DME delivered: Elease Hashimoto 02/21/2014    Elease Hashimoto
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
## 2014-02-21 NOTE — Progress Notes (Signed)
Social Work Patient ID: Joseph [name], male   DOB: 1966/01/09, 48 y.o.   MRN: [id number] Met with pt and wife DME delivered but forgot wheelchair cushion.  Following up on this.  Have attempted again to reach [facility] regarding pt's home health follow up. Home link working on wheelchair cushion.

## 2014-02-21 NOTE — Progress Notes (Signed)
Social Work Patient ID: Joseph [name], male   DOB: [birth year], 48 y.o.   MRN: [id number] Spoke with Heather-Home link they have contracted with [facility] to provide the DME. Should be bringing this am.  Still trying to find out vendor for his home health services.

## 2014-03-14 ENCOUNTER — Encounter: Payer: Self-pay | Admitting: Cardiology

## 2014-03-14 ENCOUNTER — Ambulatory Visit (INDEPENDENT_AMBULATORY_CARE_PROVIDER_SITE_OTHER): Payer: BC Managed Care – PPO | Admitting: Cardiology

## 2014-03-14 VITALS — BP 133/87 | HR 73 | Ht 72.0 in | Wt 280.5 lb

## 2014-03-14 DIAGNOSIS — I2510 Atherosclerotic heart disease of native coronary artery without angina pectoris: Secondary | ICD-10-CM

## 2014-03-14 DIAGNOSIS — E782 Mixed hyperlipidemia: Secondary | ICD-10-CM

## 2014-03-14 DIAGNOSIS — I10 Essential (primary) hypertension: Secondary | ICD-10-CM

## 2014-03-14 DIAGNOSIS — S72002S Fracture of unspecified part of neck of left femur, sequela: Secondary | ICD-10-CM

## 2014-03-14 DIAGNOSIS — S72009S Fracture of unspecified part of neck of unspecified femur, sequela: Secondary | ICD-10-CM

## 2014-03-14 MED ORDER — TICAGRELOR 90 MG PO TABS: 90.0000 mg | ORAL_TABLET | Freq: Two times a day (BID) | ORAL | Status: DC

## 2014-03-14 NOTE — Assessment & Plan Note (Signed)
Continues to recuperate slowly, planning to begin weightbearing soon.

## 2014-03-14 NOTE — Assessment & Plan Note (Signed)
Patient status post DES to the LAD at [facility] in August in the setting of unstable angina, per Dr. Salvadore Oxford. He is symptomatically stable from a cardiac perspective at this time. As Dr. Burt Knack had already recommended, I asked him to stop his aspirin while he is on both Coumadin and Brilinta. Once Coumadin stops (likely within the next 1 to 2 weeks), would resume aspirin with Brilinta. Office follow up in 4-6 weeks.

## 2014-03-14 NOTE — Assessment & Plan Note (Signed)
-  Continue Pravachol ?

## 2014-03-14 NOTE — Patient Instructions (Signed)
Your physician recommends that you schedule a follow-up appointment in: 4-6 weeks. Your physician has recommended you make the following change in your medication:  Stop aspirin. Continue all other medications the same.

## 2014-03-14 NOTE — Assessment & Plan Note (Signed)
No change in current regimen. 
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
## 2014-03-31 ENCOUNTER — Encounter: Payer: BC Managed Care – PPO | Attending: Physical Medicine & Rehabilitation

## 2014-03-31 ENCOUNTER — Inpatient Hospital Stay: Payer: BC Managed Care – PPO | Admitting: Physical Medicine & Rehabilitation

## 2014-04-21 ENCOUNTER — Telehealth: Payer: Self-pay | Admitting: Cardiology

## 2014-04-21 NOTE — Telephone Encounter (Signed)
Mrs. Franzoni called to cancel her husband's appointment for 04-22-14. States he will call back to re-schedule.

## 2014-04-22 ENCOUNTER — Ambulatory Visit: Payer: Self-pay | Admitting: Cardiology

## 2014-06-12 ENCOUNTER — Other Ambulatory Visit: Payer: Self-pay | Admitting: Orthopedic Surgery

## 2014-06-12 DIAGNOSIS — M5416 Radiculopathy, lumbar region: Secondary | ICD-10-CM

## 2014-07-07 ENCOUNTER — Other Ambulatory Visit: Payer: Self-pay | Admitting: Orthopedic Surgery

## 2014-07-07 DIAGNOSIS — M5416 Radiculopathy, lumbar region: Secondary | ICD-10-CM

## 2014-07-21 ENCOUNTER — Ambulatory Visit
Admission: RE | Admit: 2014-07-21 | Discharge: 2014-07-21 | Disposition: A | Discharge status: Home / Self Care | Payer: Worker's Compensation | Source: Ambulatory Visit | Attending: Orthopedic Surgery | Admitting: Orthopedic Surgery

## 2014-07-21 DIAGNOSIS — M5416 Radiculopathy, lumbar region: Secondary | ICD-10-CM

## 2014-10-22 ENCOUNTER — Other Ambulatory Visit: Payer: Self-pay | Admitting: Cardiology

## 2014-10-22 MED ORDER — TICAGRELOR 90 MG PO TABS: 90.0000 mg | ORAL_TABLET | Freq: Two times a day (BID) | ORAL | Status: DC

## 2014-10-22 NOTE — Telephone Encounter (Signed)
Joseph [name] is changing his medications to [facility]. [facility] called. They need RX for patient's BRILINTA) 90 MG TABS tablet

## 2015-03-05 ENCOUNTER — Ambulatory Visit: Payer: Self-pay | Admitting: Orthopedic Surgery

## 2015-03-05 NOTE — Progress Notes (Signed)
Preoperative surgical orders have been place into the Epic hospital system for Joseph [name] on 03/05/2015, 2:32 PM  by Mickel Crow for surgery on 04-08-2015.  Preop Total Hip orders including Experel Injecion, IV Tylenol, and IV Decadron as long as there are no contraindications to the above medications. Arlee Muslim, PA-C

## 2015-03-09 ENCOUNTER — Other Ambulatory Visit: Payer: Self-pay | Admitting: Physical Medicine and Rehabilitation

## 2015-04-01 NOTE — H&P (Signed)
TOTAL HIP ADMISSION H&P  Patient is admitted for left total hip arthroplasty.  Subjective:  Chief Complaint: left hip pain  HPI: Joseph [name]., 49 y.o. male, has a history of pain and functional disability in the left hip(s) due to trauma and arthritis and patient has failed non-surgical conservative treatments for greater than 12 weeks to include NSAID's and/or analgesics, flexibility and strengthening excercises, use of assistive devices and activity modification.  Onset of symptoms was gradual starting 1 year ago with rapidlly worsening course since that time.The patient noted prior procedures of the hip to include ORIF on the left hip(s).  Patient currently rates pain in the left hip at 7 out of 10 with activity. Patient has night pain, worsening of pain with activity and weight bearing, pain that interfers with activities of daily living, pain with passive range of motion and crepitus. Patient has evidence of subchondral sclerosis, periarticular osteophytes and joint space narrowing by imaging studies. This condition presents safety issues increasing the risk of falls. This patient has had proximal femur fracture.  There is no current active infection.  Patient Active Problem List   Diagnosis Date Noted  . Postoperative anemia due to acute blood loss 02/20/2014  . Closed left hip fracture ([place]) 02/07/2014  . Sleep apnea 02/07/2014  . Essential hypertension, benign 11/29/2010  . Mixed hyperlipidemia 09/08/2009  . CORONARY ATHEROSCLEROSIS NATIVE CORONARY ARTERY 09/08/2009   Past Medical History  Diagnosis Date  . CAD (coronary artery disease)     DES LAD 2/11, DES LAD at [facility] 01/2014  . Hyperlipidemia   . Essential hypertension, benign   . Diverticulosis   . Lumbar disc disease   . Right ureteral stone   . OSA on CPAP   . Hypothyroidism   . Arthritis   . Chronic lower back pain   . Nephrolithiasis     Past Surgical History  Procedure Laterality Date  . Syndesmosis repair  Right 10/2008    "rebuilt leg from the knee down after I broke it real bad" (05/27/2013)  . Tonsillectomy  1970's  . Knee arthroscopy Bilateral 1990's    "right 3, left twice" (05/27/2013)  . Lithotripsy    . Orif acetabular fracture Left 02/07/2014    Procedure: OPEN REDUCTION INTERNAL FIXATION (ORIF) ACETABULAR FRACTURE;  Surgeon: [name], MD;  Location: [place];  Service: Orthopedics;  Laterality: Left;  . Hip pinning,cannulated Left 02/07/2014    Procedure: CANNULATED HIP PINNING;  Surgeon: [name], MD;  Location: [place];  Service: Orthopedics;  Laterality: Left;      Current outpatient prescriptions:  .  aspirin 81 MG tablet, Take 81 mg by mouth daily., Disp: , Rfl:  .  b complex vitamins tablet, Take 1 tablet by mouth daily., Disp: , Rfl:  .  Coenzyme Q10 (COQ10 PO), Take 1 capsule by mouth daily., Disp: , Rfl:  .  finasteride (PROSCAR) 5 MG tablet, Take 5 mg by mouth daily., Disp: , Rfl:  .  levothyroxine (SYNTHROID, LEVOTHROID) 125 MCG tablet, Take 125 mcg by mouth daily before breakfast., Disp: , Rfl:  .  lisinopril (PRINIVIL,ZESTRIL) 10 MG tablet, Take 10 mg by mouth daily., Disp: , Rfl:  .  metoprolol succinate (TOPROL-XL) 25 MG 24 hr tablet, Take 25 mg by mouth daily., Disp: , Rfl:  .  Multiple Vitamin (MULTIVITAMIN WITH MINERALS) TABS tablet, Take 1 tablet by mouth daily., Disp: , Rfl:  .  nitroGLYCERIN (NITROSTAT) 0.4 MG SL tablet, Place 0.4 mg under  the tongue every 5 (five) minutes as needed for chest pain., Disp: , Rfl:  .  NON FORMULARY, 1 each by Other route See admin instructions. Use CPAP nightly., Disp: , Rfl:  .  Oxycodone HCl 20 MG TABS, Take 20 mg by mouth every 4 (four) hours as needed (pain)., Disp: , Rfl:  .  pravastatin (PRAVACHOL) 40 MG tablet, Take 40 mg by mouth daily., Disp: , Rfl:  .  tamsulosin (FLOMAX) 0.4 MG CAPS capsule, Take 0.4 mg by mouth daily., Disp: , Rfl:  .  vitamin C (ASCORBIC ACID) 500 MG tablet, Take 500 mg by mouth daily., Disp: ,  Rfl:    Allergies  Allergen Reactions  . Penicillins Other (See Comments)    Has patient had a PCN reaction causing immediate rash, facial/tongue/throat swelling, SOB or lightheadedness with hypotension: unknown Has patient had a PCN reaction causing severe rash involving mucus membranes or skin necrosis: unknown Has patient had a PCN reaction that required hospitalization unknown Has patient had a PCN reaction occurring within the last 10 years: no If all of the above answers are "NO", then may proceed with Cephalosporin use.   . Tetanus Toxoid Other (See Comments)    REACTION: unknown    Social History  Substance Use Topics  . Smoking status: Former Smoker -- 1.00 packs/day for 10 years    Types: Cigarettes    Quit date: 06/20/2002  . Smokeless tobacco: Never Used  . Alcohol Use: No    Family History  Problem Relation Age of Onset  . Hypertension    . Diabetes Father   . Kidney disease Father     Kidney stones  . Thyroid disease Mother   . Thyroid disease Sister   . Cancer Mother     Renal cancer     Review of Systems  Constitutional: Negative.   HENT: Negative.   Eyes: Negative.   Respiratory: Negative.   Cardiovascular: Negative.   Gastrointestinal: Negative.   Genitourinary: Negative.   Musculoskeletal: Positive for myalgias and joint pain. Negative for back pain, falls and neck pain.       Left hip pain  Skin: Negative.   Neurological: Negative.   Endo/Heme/Allergies: Negative.   Psychiatric/Behavioral: Negative.     Objective:  Physical Exam  Constitutional: He is oriented to person, place, and time. He appears well-developed. No distress.  Obese  HENT:  Head: Normocephalic and atraumatic.  Right Ear: External ear normal.  Left Ear: External ear normal.  Nose: Nose normal.  Mouth/Throat: Oropharynx is clear and moist.  Eyes: Conjunctivae and EOM are normal.  Neck: Normal range of motion. Neck supple.  Cardiovascular: Normal rate, regular rhythm  and intact distal pulses.   Murmur heard.  Systolic murmur is present with a grade of 4/6  Respiratory: Effort normal and breath sounds normal. No respiratory distress. He has no wheezes.  GI: Soft. Bowel sounds are normal. He exhibits no distension. There is no tenderness.  Musculoskeletal:       Right hip: Normal.       Left hip: He exhibits decreased range of motion.       Right knee: Normal.       Left knee: Normal.  His right hip can be flexed to 120, rotated in 30, out 40, abducted 40 without discomfort. Left hip flexion about 100, minimal internal rotation, about 30 external rotation, 30 abduction. He has pain on range of motion of the left hip. He is nontender on the greater trochanter.  Neurological: He is alert and oriented to person, place, and time. He has normal strength and normal reflexes. No sensory deficit.  Skin: No rash noted. He is not diaphoretic. No erythema.  Psychiatric: He has a normal mood and affect. His behavior is normal.    Vitals Weight: 286 lb Height: 72in Body Surface Area: 2.48 m Body Mass Index: 38.79 kg/m  Pulse: 72 (Regular)  BP: 132/84 (Sitting, Left Arm, Standard)  Imaging Review Plain radiographs demonstrate severe degenerative joint disease of the left hip(s). The bone quality appears to be adequate for age and reported activity level.  Assessment/Plan:  End stage post-traumatic osteoarthritis, left hip(s)  The patient history, physical examination, clinical judgement of the provider and imaging studies are consistent with end stage degenerative joint disease of the left hip(s) and total hip arthroplasty is deemed medically necessary. The treatment options including medical management, injection therapy, arthroscopy and arthroplasty were discussed at length. The risks and benefits of total hip arthroplasty were presented and reviewed. The risks due to aseptic loosening, infection, stiffness, dislocation/subluxation,  thromboembolic  complications and other imponderables were discussed.  The patient acknowledged the explanation, agreed to proceed with the plan and consent was signed. Patient is being admitted for inpatient treatment for surgery, pain control, PT, OT, prophylactic antibiotics, VTE prophylaxis, progressive ambulation and ADL's and discharge planning.The patient is planning to be discharged home with home health services   PCP: Dr. Kern Alberta Cardio: Dr. Reece Leader PA-C

## 2015-04-02 ENCOUNTER — Encounter (HOSPITAL_COMMUNITY): Payer: Self-pay

## 2015-04-02 ENCOUNTER — Encounter (HOSPITAL_COMMUNITY)
Admission: RE | Admit: 2015-04-02 | Discharge: 2015-04-02 | Disposition: A | Discharge status: Home / Self Care | Payer: Worker's Compensation | Source: Ambulatory Visit | Attending: Orthopedic Surgery | Admitting: Orthopedic Surgery

## 2015-04-02 ENCOUNTER — Other Ambulatory Visit: Payer: Self-pay

## 2015-04-02 DIAGNOSIS — Z01818 Encounter for other preprocedural examination: Secondary | ICD-10-CM | POA: Diagnosis not present

## 2015-04-02 DIAGNOSIS — M1612 Unilateral primary osteoarthritis, left hip: Secondary | ICD-10-CM | POA: Insufficient documentation

## 2015-04-02 HISTORY — DX: Anesthesia of skin: R20.0

## 2015-04-02 HISTORY — DX: Anesthesia of skin: R20.2

## 2015-04-02 LAB — CBC
HEMATOCRIT: 45 % (ref 39.0–52.0)
Hemoglobin: 14.8 g/dL (ref 13.0–17.0)
MCH: 31.2 pg (ref 26.0–34.0)
MCHC: 32.9 g/dL (ref 30.0–36.0)
MCV: 94.9 fL (ref 78.0–100.0)
Platelets: 217 10*3/uL (ref 150–400)
RBC: 4.74 MIL/uL (ref 4.22–5.81)
RDW: 13.1 % (ref 11.5–15.5)
WBC: 6.2 10*3/uL (ref 4.0–10.5)

## 2015-04-02 LAB — PROTIME-INR
INR: 1.03 (ref 0.00–1.49)
Prothrombin Time: 13.7 seconds (ref 11.6–15.2)

## 2015-04-02 LAB — URINE MICROSCOPIC-ADD ON

## 2015-04-02 LAB — COMPREHENSIVE METABOLIC PANEL
ALBUMIN: 4.5 g/dL (ref 3.5–5.0)
ALT: 92 U/L — ABNORMAL HIGH (ref 17–63)
ANION GAP: 6 (ref 5–15)
AST: 60 U/L — ABNORMAL HIGH (ref 15–41)
Alkaline Phosphatase: 58 U/L (ref 38–126)
BILIRUBIN TOTAL: 0.8 mg/dL (ref 0.3–1.2)
BUN: 16 mg/dL (ref 6–20)
CO2: 31 mmol/L (ref 22–32)
Calcium: 9.2 mg/dL (ref 8.9–10.3)
Chloride: 102 mmol/L (ref 101–111)
Creatinine, Ser: 0.93 mg/dL (ref 0.61–1.24)
GFR calc non Af Amer: >60 mL/min (ref >60)
GLUCOSE: 99 mg/dL (ref 65–99)
POTASSIUM: 4 mmol/L (ref 3.5–5.1)
Sodium: 139 mmol/L (ref 135–145)
TOTAL PROTEIN: 7.9 g/dL (ref 6.5–8.1)

## 2015-04-02 LAB — SURGICAL PCR SCREEN
MRSA, PCR: NEGATIVE
STAPHYLOCOCCUS AUREUS: NEGATIVE

## 2015-04-02 LAB — URINALYSIS, ROUTINE W REFLEX MICROSCOPIC
BILIRUBIN URINE: NEGATIVE
GLUCOSE, UA: NEGATIVE mg/dL
HGB URINE DIPSTICK: NEGATIVE
Ketones, ur: NEGATIVE mg/dL
Nitrite: NEGATIVE
PH: 6.5 (ref 5.0–8.0)
Protein, ur: NEGATIVE mg/dL
SPECIFIC GRAVITY, URINE: 1.017 (ref 1.005–1.030)
UROBILINOGEN UA: 0.2 mg/dL (ref 0.0–1.0)

## 2015-04-02 LAB — APTT: APTT: 30 s (ref 24–37)

## 2015-04-02 LAB — ABO/RH: ABO/RH(D): AB POS

## 2015-04-02 NOTE — Patient Instructions (Signed)
Joseph [name].  04/02/2015   Your procedure is scheduled on: Wednesday April 08, 2015  Report to [facility] Main  Entrance take [place]  elevators to 3rd floor to  [place] at 12:30 PM.  Call this number if you have problems the morning of surgery [phone]   Remember: ONLY 1 PERSON MAY GO WITH YOU TO SHORT STAY TO GET  READY MORNING OF [place].  Do not eat food After Midnight but may take clear liquids till 8:30 am day of surgery then nothing by mouth.      Take these medicines the morning of surgery with A SIP OF WATER: Levothyroxine; Metoprolol; Oxycodone if needed                               You may not have any metal on your body including hair pins and              piercings  Do not wear jewelry, lotions, powders or colognes, deodorant                           Men may shave face and neck.   Do not bring valuables to the hospital. [place].  Contacts, dentures or bridgework may not be worn into surgery.  Leave suitcase in the car. After surgery it may be brought to your room.              Please read over the following fact sheets you were given:MRSA INFORMATION SHEET; INCENTIVE SPIROMETER; BLOOD TRANSFUSION INFORMATION SHEET _____________________________________________________________________             [facility] - Preparing for Surgery Before surgery, you can play an important role.  Because skin is not sterile, your skin needs to be as free of germs as possible.  You can reduce the number of germs on your skin by washing with CHG (chlorahexidine gluconate) soap before surgery.  CHG is an antiseptic cleaner which kills germs and bonds with the skin to continue killing germs even after washing. Please DO NOT use if you have an allergy to CHG or antibacterial soaps.  If your skin becomes reddened/irritated stop using the CHG and inform your nurse when you arrive at Short  Stay. Do not shave (including legs and underarms) for at least 48 hours prior to the first CHG shower.  You may shave your face/neck. Please follow these instructions carefully:  1.  Shower with CHG Soap the night before surgery and the  morning of Surgery.  2.  If you choose to wash your hair, wash your hair first as usual with your  normal  shampoo.  3.  After you shampoo, rinse your hair and body thoroughly to remove the  shampoo.                           4.  Use CHG as you would any other liquid soap.  You can apply chg directly  to the skin and wash                       Gently with a scrungie or  clean washcloth.  5.  Apply the CHG Soap to your body ONLY FROM THE NECK DOWN.   Do not use on face/ open                           Wound or open sores. Avoid contact with eyes, ears mouth and genitals (private parts).                       Wash face,  Genitals (private parts) with your normal soap.             6.  Wash thoroughly, paying special attention to the area where your surgery  will be performed.  7.  Thoroughly rinse your body with warm water from the neck down.  8.  DO NOT shower/wash with your normal soap after using and rinsing off  the CHG Soap.                9.  Pat yourself dry with a clean towel.            10.  Wear clean pajamas.            11.  Place clean sheets on your bed the night of your first shower and do not  sleep with pets. Day of Surgery : Do not apply any lotions/deodorants the morning of surgery.  Please wear clean clothes to the hospital/surgery center.  FAILURE TO FOLLOW THESE INSTRUCTIONS MAY RESULT IN THE CANCELLATION OF YOUR SURGERY PATIENT SIGNATURE_________________________________  NURSE SIGNATURE__________________________________  ________________________________________________________________________    CLEAR LIQUID DIET   Foods Allowed                                                                     Foods Excluded  Coffee and tea,  regular and decaf                             liquids that you cannot  Plain Jell-O in any flavor                                             see through such as: Fruit ices (not with fruit pulp)                                     milk, soups, orange juice  Iced Popsicles                                    All solid food Carbonated beverages, regular and diet                                    Cranberry, grape and apple juices Sports drinks like Gatorade Lightly seasoned clear broth or consume(fat free) Sugar, honey syrup  Sample Menu Breakfast  Lunch                                     Supper Cranberry juice                    Beef broth                            Chicken broth Jell-O                                     Grape juice                           Apple juice Coffee or tea                        Jell-O                                      Popsicle                                                Coffee or tea                        Coffee or tea  _____________________________________________________________________    Incentive Spirometer  An incentive spirometer is a tool that can help keep your lungs clear and active. This tool measures how well you are filling your lungs with each breath. Taking long deep breaths may help reverse or decrease the chance of developing breathing (pulmonary) problems (especially infection) following:  A long period of time when you are unable to move or be active. BEFORE THE PROCEDURE   If the spirometer includes an indicator to show your best effort, your nurse or respiratory therapist will set it to a desired goal.  If possible, sit up straight or lean slightly forward. Try not to slouch.  Hold the incentive spirometer in an upright position. INSTRUCTIONS FOR USE   Sit on the edge of your bed if possible, or sit up as far as you can in bed or on a chair.  Hold the incentive spirometer in an upright  position.  Breathe out normally.  Place the mouthpiece in your mouth and seal your lips tightly around it.  Breathe in slowly and as deeply as possible, raising the piston or the ball toward the top of the column.  Hold your breath for 3-5 seconds or for as long as possible. Allow the piston or ball to fall to the bottom of the column.  Remove the mouthpiece from your mouth and breathe out normally.  Rest for a few seconds and repeat Steps 1 through 7 at least 10 times every 1-2 hours when you are awake. Take your time and take a few normal breaths between deep breaths.  The spirometer may include an indicator to show your best effort. Use the indicator as a goal to work toward during each repetition.  After each set of 10 deep breaths,  practice coughing to be sure your lungs are clear. If you have an incision (the cut made at the time of surgery), support your incision when coughing by placing a pillow or rolled up towels firmly against it. Once you are able to get out of bed, walk around indoors and cough well. You may stop using the incentive spirometer when instructed by your caregiver.  RISKS AND COMPLICATIONS  Take your time so you do not get dizzy or light-headed.  If you are in pain, you may need to take or ask for pain medication before doing incentive spirometry. It is harder to take a deep breath if you are having pain. AFTER USE  Rest and breathe slowly and easily.  It can be helpful to keep track of a log of your progress. Your caregiver can provide you with a simple table to help with this. If you are using the spirometer at home, follow these instructions: [place] IF:   You are having difficultly using the spirometer.  You have trouble using the spirometer as often as instructed.  Your pain medication is not giving enough relief while using the spirometer.  You develop fever of 100.5 F (38.1 C) or higher. SEEK IMMEDIATE MEDICAL CARE IF:   You cough  up bloody sputum that had not been present before.  You develop fever of 102 F (38.9 C) or greater.  You develop worsening pain at or near the incision site. MAKE SURE YOU:   Understand these instructions.  Will watch your condition.  Will get help right away if you are not doing well or get worse. Document Released: 10/17/2006 Document Revised: 08/29/2011 Document Reviewed: 12/18/2006 Exit[employer] Patient Information 2014 [employer], [place].   ________________________________________________________________________  WHAT IS A BLOOD TRANSFUSION? Blood Transfusion Information  A transfusion is the replacement of blood or some of its parts. Blood is made up of multiple cells which provide different functions.  Red blood cells carry oxygen and are used for blood loss replacement.  White blood cells fight against infection.  Platelets control bleeding.  Plasma helps clot blood.  Other blood products are available for specialized needs, such as hemophilia or other clotting disorders. BEFORE THE TRANSFUSION  Who gives blood for transfusions?   Healthy volunteers who are fully evaluated to make sure their blood is safe. This is blood bank blood. Transfusion therapy is the safest it has ever been in the practice of medicine. Before blood is taken from a donor, a complete history is taken to make sure that person has no history of diseases nor engages in risky social behavior (examples are intravenous drug use or sexual activity with multiple partners). The donor's travel history is screened to minimize risk of transmitting infections, such as malaria. The donated blood is tested for signs of infectious diseases, such as HIV and hepatitis. The blood is then tested to be sure it is compatible with you in order to minimize the chance of a transfusion reaction. If you or a relative donates blood, this is often done in anticipation of surgery and is not appropriate for emergency situations. It takes  many days to process the donated blood. RISKS AND COMPLICATIONS Although transfusion therapy is very safe and saves many lives, the main dangers of transfusion include:   Getting an infectious disease.  Developing a transfusion reaction. This is an allergic reaction to something in the blood you were given. Every precaution is taken to prevent this. The decision to have a blood transfusion has been  considered carefully by your caregiver before blood is given. Blood is not given unless the benefits outweigh the risks. AFTER THE TRANSFUSION  Right after receiving a blood transfusion, you will usually feel much better and more energetic. This is especially true if your red blood cells have gotten low (anemic). The transfusion raises the level of the red blood cells which carry oxygen, and this usually causes an energy increase.  The nurse administering the transfusion will monitor you carefully for complications. HOME CARE INSTRUCTIONS  No special instructions are needed after a transfusion. You may find your energy is better. Speak with your caregiver about any limitations on activity for underlying diseases you may have. SEEK MEDICAL CARE IF:   Your condition is not improving after your transfusion.  You develop redness or irritation at the intravenous (IV) site. SEEK IMMEDIATE MEDICAL CARE IF:  Any of the following symptoms occur over the next 12 hours:  Shaking chills.  You have a temperature by mouth above 102 F (38.9 C), not controlled by medicine.  Chest, back, or muscle pain.  People around you feel you are not acting correctly or are confused.  Shortness of breath or difficulty breathing.  Dizziness and fainting.  You get a rash or develop hives.  You have a decrease in urine output.  Your urine turns a dark color or changes to pink, red, or brown. Any of the following symptoms occur over the next 10 days:  You have a temperature by mouth above 102 F (38.9 C), not  controlled by medicine.  Shortness of breath.  Weakness after normal activity.  The white part of the eye turns yellow (jaundice).  You have a decrease in the amount of urine or are urinating less often.  Your urine turns a dark color or changes to pink, red, or brown. Document Released: 06/03/2000 Document Revised: 08/29/2011 Document Reviewed: 01/21/2008 [facility] Patient Information 2014 [place], [place].  _______________________________________________________________________

## 2015-04-02 NOTE — Progress Notes (Signed)
EKG per chart 05/19/2014 (had requested 04/01/2015 did not receive per fax till after pts PAT visit)  Clearance note per chart per Dr Quillian Quince 11/18/2014

## 2015-04-02 NOTE — Progress Notes (Addendum)
Spoke with Dr Kalman Shan / anesthesia in regards to pts H&P an also pt noting of resp issues with anesthesia. No orders given. Anesthesia to see pt day of surgery.  ECHO per epic 02/09/2014

## 2015-04-03 NOTE — Progress Notes (Signed)
CMP results in epic per PAT visit 04/02/2015 sent to Dr Wynelle Link

## 2015-04-07 NOTE — Progress Notes (Signed)
Dr Hubbard Robinson reviewed pts EKGs 04/02/2015, 02/07/2014 and 05/19/2014. Anesthesia requesting cardiac clearance an are aware pt was to see Dr Hamilton Capri today 04/07/2015. This nurse has contacted Dr Dion Body office several times with VM messages left with nurse [name] in regards to clearance from today's appt. Have not heard back at this time.

## 2015-04-07 NOTE — Progress Notes (Addendum)
Clearance note per chart per Dr Hamilton Capri 04/07/2015. Noted that Dr Hamilton Capri wants pt to remain on aspirin through procedure. Pt was contacted and verified that MD from todays appt 04/07/2015 had discussed this. Pt stated during PAT visit 04/02/2015 he was to discontinue aspirin 5 days prior to surgery per MD instruction with MD office visit (again as stated per pt). Pt states has taken aspirin today and is aware to take aspirin in am day of surgery 04/08/2015 per Dr Dion Body instruction with MD office today 04/07/2015. VM message left for [name] with Dr Dion Body office for return call back in regards to aspirin but have not received return call back at this time.

## 2015-04-07 NOTE — Progress Notes (Signed)
Spoke with [name] with Dr Dion Body office in regards to pt stating he was to stop aspirin 5 days prior to surgery. MD was aware that pt had not taken the aspirin but had instructed pt to take 2 81 mg aspirin today 04/07/2015 then take 1 81 mg aspirin in am day of surgery 04/08/2015.

## 2015-04-07 NOTE — Progress Notes (Signed)
EKG per chart 04/07/2015

## 2015-04-08 ENCOUNTER — Inpatient Hospital Stay (HOSPITAL_COMMUNITY): Payer: Worker's Compensation

## 2015-04-08 ENCOUNTER — Encounter (HOSPITAL_COMMUNITY): Payer: Self-pay

## 2015-04-08 ENCOUNTER — Inpatient Hospital Stay (HOSPITAL_COMMUNITY)
Admission: RE | Admit: 2015-04-08 | Discharge: 2015-04-10 | DRG: 470 | Disposition: A | Discharge status: Home Health | Payer: Worker's Compensation | Source: Ambulatory Visit | Attending: Orthopedic Surgery | Admitting: Orthopedic Surgery

## 2015-04-08 ENCOUNTER — Inpatient Hospital Stay (HOSPITAL_COMMUNITY): Payer: Worker's Compensation | Admitting: Anesthesiology

## 2015-04-08 ENCOUNTER — Encounter (HOSPITAL_COMMUNITY)
Admission: RE | Disposition: A | Discharge status: Home Health | Payer: Self-pay | Source: Ambulatory Visit | Attending: Orthopedic Surgery

## 2015-04-08 DIAGNOSIS — I10 Essential (primary) hypertension: Secondary | ICD-10-CM | POA: Diagnosis present

## 2015-04-08 DIAGNOSIS — Z6839 Body mass index (BMI) 39.0-39.9, adult: Secondary | ICD-10-CM

## 2015-04-08 DIAGNOSIS — M25752 Osteophyte, left hip: Secondary | ICD-10-CM | POA: Diagnosis present

## 2015-04-08 DIAGNOSIS — E039 Hypothyroidism, unspecified: Secondary | ICD-10-CM | POA: Diagnosis present

## 2015-04-08 DIAGNOSIS — M1612 Unilateral primary osteoarthritis, left hip: Secondary | ICD-10-CM | POA: Diagnosis present

## 2015-04-08 DIAGNOSIS — M87252 Osteonecrosis due to previous trauma, left femur: Principal | ICD-10-CM | POA: Diagnosis present

## 2015-04-08 DIAGNOSIS — Z87891 Personal history of nicotine dependence: Secondary | ICD-10-CM | POA: Diagnosis not present

## 2015-04-08 DIAGNOSIS — E669 Obesity, unspecified: Secondary | ICD-10-CM | POA: Diagnosis present

## 2015-04-08 DIAGNOSIS — G4733 Obstructive sleep apnea (adult) (pediatric): Secondary | ICD-10-CM | POA: Diagnosis present

## 2015-04-08 DIAGNOSIS — M87059 Idiopathic aseptic necrosis of unspecified femur: Secondary | ICD-10-CM | POA: Diagnosis present

## 2015-04-08 DIAGNOSIS — I2510 Atherosclerotic heart disease of native coronary artery without angina pectoris: Secondary | ICD-10-CM | POA: Diagnosis present

## 2015-04-08 DIAGNOSIS — E782 Mixed hyperlipidemia: Secondary | ICD-10-CM | POA: Diagnosis present

## 2015-04-08 DIAGNOSIS — Z96649 Presence of unspecified artificial hip joint: Secondary | ICD-10-CM

## 2015-04-08 DIAGNOSIS — Z955 Presence of coronary angioplasty implant and graft: Secondary | ICD-10-CM | POA: Diagnosis not present

## 2015-04-08 DIAGNOSIS — M87052 Idiopathic aseptic necrosis of left femur: Secondary | ICD-10-CM | POA: Diagnosis present

## 2015-04-08 HISTORY — PX: TOTAL HIP ARTHROPLASTY: SHX124

## 2015-04-08 LAB — TYPE AND SCREEN
ABO/RH(D): AB POS
ANTIBODY SCREEN: NEGATIVE

## 2015-04-08 SURGERY — ARTHROPLASTY, HIP, TOTAL,POSTERIOR APPROACH
Anesthesia: Monitor Anesthesia Care | Site: Hip | Laterality: Left

## 2015-04-08 MED ORDER — ONDANSETRON HCL 4 MG/2ML IJ SOLN: 4.0000 mg | Freq: Four times a day (QID) | INTRAMUSCULAR | Status: DC | PRN

## 2015-04-08 MED ORDER — SODIUM CHLORIDE 0.9 % IV SOLN: INTRAVENOUS | Status: DC

## 2015-04-08 MED ORDER — FENTANYL CITRATE (PF) 100 MCG/2ML IJ SOLN: 25.0000 ug | INTRAMUSCULAR | Status: DC | PRN

## 2015-04-08 MED ORDER — PROMETHAZINE HCL 25 MG/ML IJ SOLN: 6.2500 mg | INTRAMUSCULAR | Status: DC | PRN

## 2015-04-08 MED ORDER — PRAVASTATIN SODIUM 40 MG PO TABS
40.0000 mg | ORAL_TABLET | Freq: Every day | ORAL | Status: DC
  Administered 2015-04-08 – 2015-04-09 (×2): 40 mg via ORAL
  Filled 2015-04-08 (×4): qty 1

## 2015-04-08 MED ORDER — BISACODYL 10 MG RE SUPP: 10.0000 mg | Freq: Every day | RECTAL | Status: DC | PRN

## 2015-04-08 MED ORDER — FENTANYL CITRATE (PF) 100 MCG/2ML IJ SOLN
INTRAMUSCULAR | Status: AC
  Filled 2015-04-08: qty 4

## 2015-04-08 MED ORDER — MENTHOL 3 MG MT LOZG: 1.0000 | LOZENGE | OROMUCOSAL | Status: DC | PRN

## 2015-04-08 MED ORDER — METHOCARBAMOL 1000 MG/10ML IJ SOLN
500.0000 mg | Freq: Four times a day (QID) | INTRAVENOUS | Status: DC | PRN
  Administered 2015-04-09: 500 mg via INTRAVENOUS
  Filled 2015-04-08 (×3): qty 5

## 2015-04-08 MED ORDER — PHENOL 1.4 % MT LIQD: 1.0000 | OROMUCOSAL | Status: DC | PRN

## 2015-04-08 MED ORDER — DEXAMETHASONE SODIUM PHOSPHATE 10 MG/ML IJ SOLN
10.0000 mg | Freq: Once | INTRAMUSCULAR | Status: AC
  Administered 2015-04-09: 10 mg via INTRAVENOUS
  Filled 2015-04-08: qty 1

## 2015-04-08 MED ORDER — PROPOFOL 10 MG/ML IV BOLUS
INTRAVENOUS | Status: AC
  Filled 2015-04-08: qty 20

## 2015-04-08 MED ORDER — ACETAMINOPHEN 10 MG/ML IV SOLN
1000.0000 mg | Freq: Once | INTRAVENOUS | Status: AC
  Administered 2015-04-08: 1000 mg via INTRAVENOUS

## 2015-04-08 MED ORDER — ACETAMINOPHEN 10 MG/ML IV SOLN
INTRAVENOUS | Status: AC
  Filled 2015-04-08: qty 100

## 2015-04-08 MED ORDER — DOCUSATE SODIUM 100 MG PO CAPS
100.0000 mg | ORAL_CAPSULE | Freq: Two times a day (BID) | ORAL | Status: DC
  Administered 2015-04-08 – 2015-04-10 (×4): 100 mg via ORAL

## 2015-04-08 MED ORDER — TRAMADOL HCL 50 MG PO TABS
50.0000 mg | ORAL_TABLET | Freq: Four times a day (QID) | ORAL | Status: DC | PRN
  Administered 2015-04-09: 100 mg via ORAL
  Filled 2015-04-08: qty 2

## 2015-04-08 MED ORDER — RIVAROXABAN 10 MG PO TABS
10.0000 mg | ORAL_TABLET | Freq: Every day | ORAL | Status: DC
  Administered 2015-04-09 – 2015-04-10 (×2): 10 mg via ORAL
  Filled 2015-04-08 (×3): qty 1

## 2015-04-08 MED ORDER — MIDAZOLAM HCL 2 MG/2ML IJ SOLN
INTRAMUSCULAR | Status: AC
  Filled 2015-04-08: qty 4

## 2015-04-08 MED ORDER — ONDANSETRON HCL 4 MG PO TABS: 4.0000 mg | ORAL_TABLET | Freq: Four times a day (QID) | ORAL | Status: DC | PRN

## 2015-04-08 MED ORDER — FLEET ENEMA 7-19 GM/118ML RE ENEM: 1.0000 | ENEMA | Freq: Once | RECTAL | Status: DC | PRN

## 2015-04-08 MED ORDER — CHLORHEXIDINE GLUCONATE 4 % EX LIQD: 60.0000 mL | Freq: Once | CUTANEOUS | Status: DC

## 2015-04-08 MED ORDER — FINASTERIDE 5 MG PO TABS
5.0000 mg | ORAL_TABLET | Freq: Every day | ORAL | Status: DC
  Administered 2015-04-08 – 2015-04-09 (×2): 5 mg via ORAL
  Filled 2015-04-08 (×4): qty 1

## 2015-04-08 MED ORDER — HYDROMORPHONE HCL 1 MG/ML IJ SOLN
0.5000 mg | INTRAMUSCULAR | Status: DC | PRN
  Administered 2015-04-09 (×3): 1 mg via INTRAVENOUS
  Filled 2015-04-08 (×3): qty 1

## 2015-04-08 MED ORDER — BUPIVACAINE HCL (PF) 0.25 % IJ SOLN
INTRAMUSCULAR | Status: AC
  Filled 2015-04-08: qty 30

## 2015-04-08 MED ORDER — TRANEXAMIC ACID 1000 MG/10ML IV SOLN
2000.0000 mg | Freq: Once | INTRAVENOUS | Status: AC
  Administered 2015-04-08: 2000 mg via TOPICAL
  Filled 2015-04-08: qty 20

## 2015-04-08 MED ORDER — ACETAMINOPHEN 650 MG RE SUPP: 650.0000 mg | Freq: Four times a day (QID) | RECTAL | Status: DC | PRN

## 2015-04-08 MED ORDER — LEVOTHYROXINE SODIUM 125 MCG PO TABS
125.0000 ug | ORAL_TABLET | Freq: Every day | ORAL | Status: DC
  Administered 2015-04-09 – 2015-04-10 (×2): 125 ug via ORAL
  Filled 2015-04-08 (×3): qty 1

## 2015-04-08 MED ORDER — NITROGLYCERIN 0.4 MG SL SUBL: 0.4000 mg | SUBLINGUAL_TABLET | SUBLINGUAL | Status: DC | PRN

## 2015-04-08 MED ORDER — POLYETHYLENE GLYCOL 3350 17 G PO PACK: 17.0000 g | PACK | Freq: Every day | ORAL | Status: DC | PRN

## 2015-04-08 MED ORDER — ONDANSETRON HCL 4 MG/2ML IJ SOLN
INTRAMUSCULAR | Status: DC | PRN
  Administered 2015-04-08: 4 mg via INTRAVENOUS

## 2015-04-08 MED ORDER — KETOROLAC TROMETHAMINE 15 MG/ML IJ SOLN
7.5000 mg | Freq: Four times a day (QID) | INTRAMUSCULAR | Status: AC | PRN
  Administered 2015-04-08 – 2015-04-09 (×2): 7.5 mg via INTRAVENOUS
  Filled 2015-04-08 (×2): qty 1

## 2015-04-08 MED ORDER — DEXTROSE 5 % IV SOLN
3.0000 g | INTRAVENOUS | Status: AC
  Administered 2015-04-08: 3 g via INTRAVENOUS
  Filled 2015-04-08: qty 3000

## 2015-04-08 MED ORDER — METHOCARBAMOL 500 MG PO TABS
500.0000 mg | ORAL_TABLET | Freq: Four times a day (QID) | ORAL | Status: DC | PRN
  Administered 2015-04-08 – 2015-04-10 (×4): 500 mg via ORAL
  Filled 2015-04-08 (×5): qty 1

## 2015-04-08 MED ORDER — VANCOMYCIN HCL IN DEXTROSE 1-5 GM/200ML-% IV SOLN
1000.0000 mg | Freq: Two times a day (BID) | INTRAVENOUS | Status: AC
  Administered 2015-04-09: 1000 mg via INTRAVENOUS
  Filled 2015-04-08: qty 200

## 2015-04-08 MED ORDER — LACTATED RINGERS IV SOLN
INTRAVENOUS | Status: DC | PRN
  Administered 2015-04-08 (×2): via INTRAVENOUS

## 2015-04-08 MED ORDER — FENTANYL CITRATE (PF) 100 MCG/2ML IJ SOLN
INTRAMUSCULAR | Status: DC | PRN
  Administered 2015-04-08: 100 ug via INTRAVENOUS

## 2015-04-08 MED ORDER — KCL IN DEXTROSE-NACL 20-5-0.9 MEQ/L-%-% IV SOLN
INTRAVENOUS | Status: DC
  Administered 2015-04-08: 100 mL/h via INTRAVENOUS
  Administered 2015-04-09: 06:00:00 via INTRAVENOUS
  Filled 2015-04-08 (×3): qty 1000

## 2015-04-08 MED ORDER — METOCLOPRAMIDE HCL 5 MG/ML IJ SOLN: 5.0000 mg | Freq: Three times a day (TID) | INTRAMUSCULAR | Status: DC | PRN

## 2015-04-08 MED ORDER — OXYCODONE HCL 5 MG PO TABS
10.0000 mg | ORAL_TABLET | ORAL | Status: DC | PRN
  Administered 2015-04-08 (×2): 10 mg via ORAL
  Administered 2015-04-08: 15 mg via ORAL
  Administered 2015-04-09 (×2): 20 mg via ORAL
  Administered 2015-04-09: 10 mg via ORAL
  Administered 2015-04-09: 15 mg via ORAL
  Administered 2015-04-09: 10 mg via ORAL
  Administered 2015-04-09: 15 mg via ORAL
  Administered 2015-04-09 (×2): 20 mg via ORAL
  Administered 2015-04-10 (×3): 10 mg via ORAL
  Filled 2015-04-08: qty 2
  Filled 2015-04-08: qty 3
  Filled 2015-04-08: qty 2
  Filled 2015-04-08 (×3): qty 4
  Filled 2015-04-08 (×2): qty 2
  Filled 2015-04-08 (×2): qty 3
  Filled 2015-04-08 (×2): qty 2
  Filled 2015-04-08: qty 4
  Filled 2015-04-08 (×3): qty 2

## 2015-04-08 MED ORDER — TAMSULOSIN HCL 0.4 MG PO CAPS
0.4000 mg | ORAL_CAPSULE | Freq: Every day | ORAL | Status: DC
  Administered 2015-04-08 – 2015-04-09 (×2): 0.4 mg via ORAL
  Filled 2015-04-08 (×4): qty 1

## 2015-04-08 MED ORDER — METOCLOPRAMIDE HCL 10 MG PO TABS: 5.0000 mg | ORAL_TABLET | Freq: Three times a day (TID) | ORAL | Status: DC | PRN

## 2015-04-08 MED ORDER — DEXAMETHASONE SODIUM PHOSPHATE 10 MG/ML IJ SOLN
10.0000 mg | Freq: Once | INTRAMUSCULAR | Status: AC
  Administered 2015-04-08: 10 mg via INTRAVENOUS

## 2015-04-08 MED ORDER — MIDAZOLAM HCL 5 MG/5ML IJ SOLN
INTRAMUSCULAR | Status: DC | PRN
  Administered 2015-04-08: 2 mg via INTRAVENOUS

## 2015-04-08 MED ORDER — METOPROLOL SUCCINATE ER 25 MG PO TB24
25.0000 mg | ORAL_TABLET | Freq: Every day | ORAL | Status: DC
  Administered 2015-04-09 – 2015-04-10 (×2): 25 mg via ORAL
  Filled 2015-04-08 (×2): qty 1

## 2015-04-08 MED ORDER — BUPIVACAINE HCL (PF) 0.5 % IJ SOLN
INTRAMUSCULAR | Status: DC | PRN
  Administered 2015-04-08: 15 mg

## 2015-04-08 MED ORDER — BUPIVACAINE HCL 0.25 % IJ SOLN
INTRAMUSCULAR | Status: DC | PRN
  Administered 2015-04-08: 30 mL

## 2015-04-08 MED ORDER — ACETAMINOPHEN 500 MG PO TABS
1000.0000 mg | ORAL_TABLET | Freq: Four times a day (QID) | ORAL | Status: AC
  Administered 2015-04-08 – 2015-04-09 (×4): 1000 mg via ORAL
  Filled 2015-04-08 (×4): qty 2

## 2015-04-08 MED ORDER — DIPHENHYDRAMINE HCL 12.5 MG/5ML PO ELIX: 12.5000 mg | ORAL_SOLUTION | ORAL | Status: DC | PRN

## 2015-04-08 MED ORDER — PROPOFOL 500 MG/50ML IV EMUL
INTRAVENOUS | Status: DC | PRN
  Administered 2015-04-08: 100 ug/kg/min via INTRAVENOUS

## 2015-04-08 MED ORDER — ACETAMINOPHEN 325 MG PO TABS: 650.0000 mg | ORAL_TABLET | Freq: Four times a day (QID) | ORAL | Status: DC | PRN

## 2015-04-08 SURGICAL SUPPLY — 59 items
BAG DECANTER FOR FLEXI CONT (MISCELLANEOUS) ×3 IMPLANT
BAG SPEC THK2 15X12 ZIP CLS (MISCELLANEOUS) ×1
BAG ZIPLOCK 12X15 (MISCELLANEOUS) ×2 IMPLANT
BIT DRILL 2.8X128 (BIT) ×2 IMPLANT
BIT DRILL 2.8X128MM (BIT) ×1
BLADE EXTENDED COATED 6.5IN (ELECTRODE) ×3 IMPLANT
BLADE SAW SAG 73X25 THK (BLADE) ×2
BLADE SAW SGTL 73X25 THK (BLADE) ×1 IMPLANT
CAPT HIP TOTAL 2 ×2 IMPLANT
CLOSURE WOUND 1/2 X4 (GAUZE/BANDAGES/DRESSINGS) ×1
DECANTER SPIKE VIAL GLASS SM (MISCELLANEOUS) ×3 IMPLANT
DRAPE INCISE IOBAN 66X45 STRL (DRAPES) ×3 IMPLANT
DRAPE ORTHO SPLIT 77X108 STRL (DRAPES) ×6
DRAPE POUCH INSTRU U-SHP 10X18 (DRAPES) ×3 IMPLANT
DRAPE SURG ORHT 6 SPLT 77X108 (DRAPES) ×2 IMPLANT
DRAPE U-SHAPE 47X51 STRL (DRAPES) ×3 IMPLANT
DRSG ADAPTIC 3X8 NADH LF (GAUZE/BANDAGES/DRESSINGS) ×3 IMPLANT
DRSG MEPILEX BORDER 4X4 (GAUZE/BANDAGES/DRESSINGS) ×3 IMPLANT
DRSG MEPILEX BORDER 4X8 (GAUZE/BANDAGES/DRESSINGS) ×3 IMPLANT
DURAPREP 26ML APPLICATOR (WOUND CARE) ×3 IMPLANT
ELECT REM PT RETURN 9FT ADLT (ELECTROSURGICAL) ×3
ELECTRODE REM PT RTRN 9FT ADLT (ELECTROSURGICAL) ×1 IMPLANT
EVACUATOR 1/8 PVC DRAIN (DRAIN) ×3 IMPLANT
FACESHIELD WRAPAROUND (MASK) ×12 IMPLANT
FACESHIELD WRAPAROUND OR TEAM (MASK) ×4 IMPLANT
GAUZE SPONGE 4X4 12PLY STRL (GAUZE/BANDAGES/DRESSINGS) ×1 IMPLANT
GLOVE BIO SURGEON STRL SZ7.5 (GLOVE) ×2 IMPLANT
GLOVE BIO SURGEON STRL SZ8 (GLOVE) ×3 IMPLANT
GLOVE BIOGEL PI IND STRL 8 (GLOVE) ×1 IMPLANT
GLOVE BIOGEL PI INDICATOR 8 (GLOVE) ×2
GLOVE SURG SS PI 6.5 STRL IVOR (GLOVE) IMPLANT
GOWN STRL REUS W/TWL LRG LVL3 (GOWN DISPOSABLE) ×3 IMPLANT
GOWN STRL REUS W/TWL XL LVL3 (GOWN DISPOSABLE) IMPLANT
IMMOBILIZER KNEE 20 (SOFTGOODS)
IMMOBILIZER KNEE 20 THIGH 36 (SOFTGOODS) ×1 IMPLANT
KIT BASIN OR (CUSTOM PROCEDURE TRAY) ×3 IMPLANT
MANIFOLD NEPTUNE II (INSTRUMENTS) ×3 IMPLANT
NDL SAFETY ECLIPSE 18X1.5 (NEEDLE) ×3 IMPLANT
NEEDLE HYPO 18GX1.5 SHARP (NEEDLE) ×9
NS IRRIG 1000ML POUR BTL (IV SOLUTION) ×3 IMPLANT
PACK TOTAL JOINT (CUSTOM PROCEDURE TRAY) ×3 IMPLANT
PADDING CAST COTTON 6X4 STRL (CAST SUPPLIES) ×1 IMPLANT
PASSER SUT SWANSON 36MM LOOP (INSTRUMENTS) ×3 IMPLANT
PEN SKIN MARKING BROAD (MISCELLANEOUS) ×3 IMPLANT
POSITIONER SURGICAL ARM (MISCELLANEOUS) ×3 IMPLANT
STRIP CLOSURE SKIN 1/2X4 (GAUZE/BANDAGES/DRESSINGS) ×3 IMPLANT
SUT ETHIBOND NAB CT1 #1 30IN (SUTURE) ×6 IMPLANT
SUT MNCRL AB 4-0 PS2 18 (SUTURE) ×3 IMPLANT
SUT VIC AB 2-0 CT1 27 (SUTURE) ×9
SUT VIC AB 2-0 CT1 TAPERPNT 27 (SUTURE) ×3 IMPLANT
SUT VLOC 180 0 24IN GS25 (SUTURE) ×3 IMPLANT
SYR 20CC LL (SYRINGE) ×1 IMPLANT
SYR 50ML LL SCALE MARK (SYRINGE) ×2 IMPLANT
TOWEL OR 17X26 10 PK STRL BLUE (TOWEL DISPOSABLE) ×6 IMPLANT
TOWEL OR NON WOVEN STRL DISP B (DISPOSABLE) ×3 IMPLANT
TRAY FOLEY W/METER SILVER 14FR (SET/KITS/TRAYS/PACK) ×1 IMPLANT
TRAY FOLEY W/METER SILVER 16FR (SET/KITS/TRAYS/PACK) ×3 IMPLANT
WATER STERILE IRR 1500ML POUR (IV SOLUTION) ×3 IMPLANT
YANKAUER SUCT BULB TIP 10FT TU (MISCELLANEOUS) ×3 IMPLANT

## 2015-04-08 NOTE — Anesthesia Preprocedure Evaluation (Addendum)
Anesthesia Evaluation  Patient identified by MRN, date of birth, ID band Patient awake    Reviewed: Allergy & Precautions, NPO status , Patient's Chart, lab work & pertinent test results, reviewed documented beta blocker date and time   History of Anesthesia Complications (+) history of anesthetic complications  Airway Mallampati: III  TM Distance: >3 FB Neck ROM: Full    Dental  (+) Teeth Intact, Dental Advisory Given, Chipped,    Pulmonary sleep apnea and Continuous Positive Airway Pressure Ventilation , former smoker,    Pulmonary exam normal breath sounds clear to auscultation       Cardiovascular Exercise Tolerance: Good hypertension, Pt. on medications and Pt. on home beta blockers (-) angina+ CAD and + Cardiac Stents  (-) Past MI Normal cardiovascular exam Rhythm:Regular Rate:Normal     Neuro/Psych negative neurological ROS     GI/Hepatic negative GI ROS, Neg liver ROS,   Endo/Other  Hypothyroidism   Renal/GU negative Renal ROS     Musculoskeletal  (+) Arthritis , Osteoarthritis,    Abdominal   Peds  Hematology negative hematology ROS (+)   Anesthesia Other Findings Day of surgery medications reviewed with the patient.  Reproductive/Obstetrics                            Anesthesia Physical Anesthesia Plan  ASA: III  Anesthesia Plan: Spinal and MAC   Post-op Pain Management:    Induction: Intravenous  Airway Management Planned: Simple Face Mask  Additional Equipment:   Intra-op Plan:   Post-operative Plan:   Informed Consent: I have reviewed the patients History and Physical, chart, labs and discussed the procedure including the risks, benefits and alternatives for the proposed anesthesia with the patient or authorized representative who has indicated his/her understanding and acceptance.   Dental advisory given  Plan Discussed with: CRNA  Anesthesia Plan Comments:  (Discussed risks and benefits of and differences between spinal and general. Discussed risks of spinal including headache, backache, failure, bleeding, infection, and nerve damage. Patient consents to spinal. Questions answered. Coagulation studies and platelet count acceptable.)        Anesthesia Quick Evaluation

## 2015-04-08 NOTE — Interval H&P Note (Signed)
History and Physical Interval Note:  04/08/2015 2:13 PM  Joseph [name].  has presented today for surgery, with the diagnosis of oa left hip  The various methods of treatment have been discussed with the patient and family. After consideration of risks, benefits and other options for treatment, the patient has consented to  Procedure(s): LEFT TOTAL HIP ARTHROPLASTY (Left) as a surgical intervention .  The patient's history has been reviewed, patient examined, no change in status, stable for surgery.  I have reviewed the patient's chart and labs.  Questions were answered to the patient's satisfaction.     [name]

## 2015-04-08 NOTE — Progress Notes (Signed)
CPAP - QHS set up for the Pt.  Pt is using hospital machine with his tubing and nasal mask from home.  Pt requested that I set it up so that he could place it on himself when he was ready.  Machine set on 13 cm H2O of CPAP per Pt stated home settings.  Humidifier filled with sterile water, machine plugged into red outlet, and 2 Lpm of O2 bled into tubing.  Pt and spouse encouraged to call and request RT asisstance should help be needed with the CPAP at anytime throughout the night.

## 2015-04-08 NOTE — Anesthesia Procedure Notes (Signed)
Spinal Patient location during procedure: OR Start time: 04/08/2015 2:19 PM End time: 04/08/2015 2:22 PM Staffing Resident/CRNA: Harle Stanford R Performed by: resident/CRNA  Preanesthetic Checklist Completed: patient identified, site marked, surgical consent, pre-op evaluation, timeout performed, IV checked, risks and benefits discussed and monitors and equipment checked Spinal Block Patient position: sitting Prep: Betadine Approach: midline Location: L3-4 Injection technique: single-shot Needle Needle type: Sprotte  Needle gauge: 24 G Needle length: 10 cm Needle insertion depth: 9 cm Assessment Sensory level: T6 Additional Notes Spinal kit date checked. Timeout performed. SAB without difficulty

## 2015-04-08 NOTE — Transfer of Care (Signed)
Immediate Anesthesia Transfer of Care Note  Patient: Joseph [name].  Procedure(s) Performed: Procedure(s): LEFT TOTAL HIP ARTHROPLASTY (Left)  Patient Location: PACU  Anesthesia Type:Spinal  Level of Consciousness: awake, alert , oriented and patient cooperative  Airway & Oxygen Therapy: Patient Spontanous Breathing and Patient connected to face mask oxygen  Post-op Assessment: Report given to RN, Post -op Vital signs reviewed and stable and Patient moving all extremities X 4  Post vital signs: stable  Last Vitals:  Filed Vitals:   04/08/15 1232  BP: 139/87  Pulse: 65  Temp: 36.6 C  Resp: 18    Complications: No apparent anesthesia complications

## 2015-04-08 NOTE — Op Note (Signed)
Pre-operative diagnosis- Avascular necrosis  Left hip  Post-operative diagnosis- Avascular necrosis  Left hip  Procedure-  LeftTotal Hip Arthroplasty  Surgeon- Joseph Plover. Ambri Miltner, MD  Assistant- Arlee Muslim, PA-C   Anesthesia  Spinal  EBL- 300 ml  Drain Hemovac   Complication- None  Condition-PACU - hemodynamically stable.   Brief Clinical Note- Joseph [name]. is a 49 y.o. 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 Pain occurs with activity and rest including pain at night. He has tried analgesics, protected weight bearing and rest without benefit. Pain is too severe to attempt physical therapy. Radiographs demonstrate an area of collapse of the femoral head. He presents now for left THA.  Procedure in detail-   The patient is brought into the operating room and placed on the operating table. After successful administration of Spinal  anesthesia, the patient is placed in the  Right lateral decubitus position with the  Left side up and held in place with the hip positioner. The lower extremity is isolated from the perineum with plastic drapes and time-out is performed by the surgical team. The lower extremity is then prepped and draped in the usual sterile fashion. A short posterolateral incision is made with a ten blade through the subcutaneous tissue to the level of the fascia lata which is incised in line with the skin incision. The sciatic nerve is palpated and protected and the short external rotators and capsule are isolated from the femur. The hip is then dislocated and the center of the femoral head is marked. A trial prosthesis is placed such that the trial head corresponds to the center of the patients' native femoral head. The resection level is marked on the femoral neck and the resection is made with an oscillating saw. The femoral head is removed and femoral retractors placed to gain access to the femoral  canal.      The canal finder is passed into the femoral canal and the canal is thoroughly irrigated with sterile saline to remove the fatty contents. Axial reaming is performed to 13.5  mm, proximal reaming to 65F  and the sleeve machined to a XXL. A 65F XXL trial sleeve is placed into the proximal femur.      The femur is then retracted anteriorly to gain acetabular exposure. Acetabular retractors are placed and the labrum and osteophytes are removed, Acetabular reaming is performed to 53  mm and a 54  mm Pinnacle acetabular shell is placed in anatomic position with excellent purchase. Additional dome screws  were not needed. The permanent 36 mm neutral + 4 Marathon liner is placed into the acetabular shell.      The trial femur is then placed into the femoral canal. The size is 18 x 13  stem with a 36 + 12  neck and a 36 + 6 head with the neck version 10 degrees beyond  the patients' native anteversion. The hip is reduced with excellent stability with full extension and full external rotation, 70 degrees flexion with 40 degrees adduction and 90 degrees internal rotation and 90 degrees of flexion with 70 degrees of internal rotation. The operative leg is placed on top of the non-operative leg and the leg lengths are found to be equal. The trials are then removed and the permanent implant of the same size is impacted into the femoral canal. The ceramic femoral head of the same size as the trial is placed and the hip is  reduced with the same stability parameters. The operative leg is again placed on top of the non-operative leg and the leg lengths are found to be equal.      The wound is then copiously irrigated with saline solution and the capsule and short external rotators are re-attached to the femur through drill holes with Ethibond suture. The fascia lata is closed over a hemovac drain with #1 v-loc.Te fascia lata, gluteal muscles and subcutaneous tissues are injected with 30 ml of .25% Marcaine. The  subcutaneous tissues are closed with #1 and2-0 vicryl and the subcuticular layer closed with running 4-0 Monocryl. The drain is hooked to suction, incision cleaned and dried, and steri-srips and a bulky sterile dressing applied. The limb is placed into a knee immobilizer and the patient is awakened and transported to recovery in stable condition.      Please note that a surgical assistant was a medical necessity for this procedure in order to perform it in a safe and expeditious manner. The assistant was necessary to provide retraction to the vital neurovascular structures and to retract and position the limb to allow for anatomic placement of the prosthetic components.  Joseph Plover Brenly Trawick, MD    04/08/2015, 3:47 PM

## 2015-04-08 NOTE — Anesthesia Postprocedure Evaluation (Signed)
  Anesthesia Post-op Note  Patient: Joseph [name].  Procedure(s) Performed: Procedure(s) (LRB): LEFT TOTAL HIP ARTHROPLASTY (Left)  Patient Location: PACU  Anesthesia Type: MAC and Spinal  Level of Consciousness: awake and alert   Airway and Oxygen Therapy: Patient Spontanous Breathing  Post-op Pain: mild  Post-op Assessment: Post-op Vital signs reviewed, Patient's Cardiovascular Status Stable, Respiratory Function Stable, Patent Airway and No signs of Nausea or vomiting  Last Vitals:  Filed Vitals:   04/08/15 1721  BP: 111/65  Pulse: 48  Temp: 36.8 C  Resp: 14    Post-op Vital Signs: stable   Complications: No apparent anesthesia complications

## 2015-04-09 ENCOUNTER — Encounter (HOSPITAL_COMMUNITY): Payer: Self-pay | Admitting: Orthopedic Surgery

## 2015-04-09 LAB — CBC
HEMATOCRIT: 42.7 % (ref 39.0–52.0)
Hemoglobin: 13.9 g/dL (ref 13.0–17.0)
MCH: 30.9 pg (ref 26.0–34.0)
MCHC: 32.6 g/dL (ref 30.0–36.0)
MCV: 94.9 fL (ref 78.0–100.0)
PLATELETS: 225 10*3/uL (ref 150–400)
RBC: 4.5 MIL/uL (ref 4.22–5.81)
RDW: 12.8 % (ref 11.5–15.5)
WBC: 17.3 10*3/uL — AB (ref 4.0–10.5)

## 2015-04-09 LAB — BASIC METABOLIC PANEL
Anion gap: 5 (ref 5–15)
BUN: 12 mg/dL (ref 6–20)
CO2: 28 mmol/L (ref 22–32)
CREATININE: 0.81 mg/dL (ref 0.61–1.24)
Calcium: 8.6 mg/dL — ABNORMAL LOW (ref 8.9–10.3)
Chloride: 106 mmol/L (ref 101–111)
GFR calc Af Amer: >60 mL/min (ref >60)
GFR calc non Af Amer: >60 mL/min (ref >60)
GLUCOSE: 188 mg/dL — AB (ref 65–99)
Potassium: 4.1 mmol/L (ref 3.5–5.1)
Sodium: 139 mmol/L (ref 135–145)
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
## 2015-04-09 NOTE — Progress Notes (Signed)
OT Cancellation Note  Patient Details Name: Joseph [name]. MRN: [id number] DOB: 1966/05/19   Cancelled Treatment:    Reason Eval/Treat Not Completed: Other (comment) checked on pt earlier but he had just finished up PT and was in a lot of pain. Will check back later today or in am for OT eval.  Jules Schick  [phone] 04/09/2015, 12:36 PM

## 2015-04-09 NOTE — Care Management Note (Addendum)
Case Management Note  Patient Details  Name: Joseph [name]. MRN: [id number] Date of Birth: 1965-11-12  Subjective/Objective:                  LEFT TOTAL HIP ARTHROPLASTY (Left) Action/Plan:  Discharge plannning Expected Discharge Date:                  Expected Discharge Plan:  [place]  In-House Referral:  NA  Discharge planning Services  NA  Post Acute Care Choice:  NA Choice offered to:  NA  DME Arranged:    DME Agency:     HH Arranged:  PT HH Agency:     Status of Service:  In process, will continue to follow  Medicare Important Message Given:    Date Medicare IM Given:    Medicare IM give by:    Date Additional Medicare IM Given:    Additional Medicare Important Message give by:     If discussed at [place] of Stay Meetings, dates discussed:    Additional Comments: CM received callback from [facility] Ana who requested I fax information to [phone] and she will arrange for HHPT and has been in touch with pt.  CM faxed facesheet, orders, H&P, OP note, Eval Notes and last progress notes and received confirmation of receipt.  No other CM needs were communicated.  CM placed call to Abigail and left duplicate message .  CM met with pt to discuss discharge.  Pt gave this Cm his Workers Comp contact information: French Ana [phone].  Pt states he has both a rolling walker and 3n1 at home.  CM called French Ana and left message to request arrangement for HHPT giving Claim # as identifier (per HIPAA).  Waiting for callback. Dellie Catholic, RN 04/09/2015, 11:54 AM
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
## 2015-04-09 NOTE — Progress Notes (Signed)
Utilization review completed.  
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
## 2015-04-09 NOTE — Discharge Instructions (Addendum)
Dr. [name] Total Joint Specialist [facility] [address] [place], [place] [address] [phone]   POSTERIOR TOTAL HIP REPLACEMENT POSTOPERATIVE DIRECTIONS  Hip Rehabilitation, Guidelines Following Surgery  The results of a hip operation are greatly improved after range of motion and muscle strengthening exercises. Follow all safety measures which are given to protect your hip. If any of these exercises cause increased pain or swelling in your joint, decrease the amount until you are comfortable again. Then slowly increase the exercises. Call your caregiver if you have problems or questions.   HOME CARE INSTRUCTIONS  Remove items at home which could result in a fall. This includes throw rugs or furniture in walking pathways.   ICE to the affected hip every three hours for 30 minutes at a time and then as needed for pain and swelling.  Continue to use ice on the hip for pain and swelling from surgery. You may notice swelling that will progress down to the foot and ankle.  This is normal after surgery.  Elevate the leg when you are not up walking on it.    Continue to use the breathing machine which will help keep your temperature down.  It is common for your temperature to cycle up and down following surgery, especially at night when you are not up moving around and exerting yourself.  The breathing machine keeps your lungs expanded and your temperature down.  DIET You may resume your previous home diet once your are discharged from the hospital.  DRESSING / WOUND CARE / SHOWERING You may shower 3 days after surgery, but keep the wounds dry during showering.  You may use an occlusive plastic wrap (Press'n Seal for example), NO SOAKING/SUBMERGING IN THE BATHTUB.  If the bandage gets wet, change with a clean dry gauze.  If the incision gets wet, pat the wound dry with a clean towel. You may start showering once you are discharged home but do not submerge  the incision under water. Just pat the incision dry and apply a dry gauze dressing on daily. Change the surgical dressing daily and reapply a dry dressing each time.    ACTIVITY Walk with your walker as instructed. Use walker as long as suggested by your caregivers. Avoid periods of inactivity such as sitting longer than an hour when not asleep. This helps prevent blood clots.  You may resume a sexual relationship in one month or when given the OK by your doctor.  You may return to work once you are cleared by your doctor.  Do not drive a car for 6 weeks or until released by you surgeon.  Do not drive while taking narcotics.  WEIGHT BEARING Weight bearing as tolerated with assist device (walker, cane, etc) as directed, use it as long as suggested by your surgeon or therapist, typically at least 4-6 weeks.  POSTOPERATIVE CONSTIPATION PROTOCOL Constipation - defined medically as fewer than three stools per week and severe constipation as less than one stool per week.  One of the most common issues patients have following surgery is constipation.  Even if you have a regular bowel pattern at home, your normal regimen is likely to be disrupted due to multiple reasons following surgery.  Combination of anesthesia, postoperative narcotics, change in appetite and fluid intake all can affect your bowels.  In order to avoid complications following surgery, here are some recommendations in order to help you during your recovery period.  Colace (docusate) - Pick up an over-the-counter  form of Colace or another stool softener and take twice a day as long as you are requiring postoperative pain medications.  Take with a full glass of water daily.  If you experience loose stools or diarrhea, hold the colace until you stool forms back up.  If your symptoms do not get better within 1 week or if they get worse, check with your doctor.  Dulcolax (bisacodyl) - Pick up over-the-counter and take as directed by the  product packaging as needed to assist with the movement of your bowels.  Take with a full glass of water.  Use this product as needed if not relieved by Colace only.   MiraLax (polyethylene glycol) - Pick up over-the-counter to have on hand.  MiraLax is a solution that will increase the amount of water in your bowels to assist with bowel movements.  Take as directed and can mix with a glass of water, juice, soda, coffee, or tea.  Take if you go more than two days without a movement. Do not use MiraLax more than once per day. Call your doctor if you are still constipated or irregular after using this medication for 7 days in a row.  If you continue to have problems with postoperative constipation, please contact the office for further assistance and recommendations.  If you experience "the worst abdominal pain ever" or develop nausea or vomiting, please contact the office immediatly for further recommendations for treatment.  ITCHING  If you experience itching with your medications, try taking only a single pain pill, or even half a pain pill at a time.  You can also use Benadryl over the counter for itching or also to help with sleep.   TED HOSE STOCKINGS Wear the elastic stockings on both legs for three weeks following surgery during the day but you may remove then at night for sleeping.  MEDICATIONS See your medication summary on the After Visit Summary that the nursing staff will review with you prior to discharge.  You may have some home medications which will be placed on hold until you complete the course of blood thinner medication.  It is important for you to complete the blood thinner medication as prescribed by your surgeon.  Continue your approved medications as instructed at time of discharge.  PRECAUTIONS If you experience chest pain or shortness of breath - call 911 immediately for transfer to the hospital emergency department.  If you develop a fever greater that 101 F, purulent  drainage from wound, increased redness or drainage from wound, foul odor from the wound/dressing, or calf pain - CONTACT YOUR SURGEON.                                                   FOLLOW-UP APPOINTMENTS Make sure you keep all of your appointments after your operation with your surgeon and caregivers. You should call the office at the above phone number and make an appointment for approximately two weeks after the date of your surgery or on the date instructed by your surgeon outlined in the "After Visit Summary".  RANGE OF MOTION AND STRENGTHENING EXERCISES  These exercises are designed to help you keep full movement of your hip joint. Follow your caregiver's or physical therapist's instructions. Perform all exercises about fifteen times, three times per day or as directed. Exercise both hips, even if you  have had only one joint replacement. These exercises can be done on a training (exercise) mat, on the floor, on a table or on a bed. Use whatever works the best and is most comfortable for you. Use music or television while you are exercising so that the exercises are a pleasant break in your day. This will make your life better with the exercises acting as a break in routine you can look forward to.  Lying on your back, slowly slide your foot toward your buttocks, raising your knee up off the floor. Then slowly slide your foot back down until your leg is straight again.  Lying on your back spread your legs as far apart as you can without causing discomfort.  Lying on your side, raise your upper leg and foot straight up from the floor as far as is comfortable. Slowly lower the leg and repeat.  Lying on your back, tighten up the muscle in the front of your thigh (quadriceps muscles). You can do this by keeping your leg straight and trying to raise your heel off the floor. This helps strengthen the largest muscle supporting your knee.  Lying on your back, tighten up the muscles of your buttocks both  with the legs straight and with the knee bent at a comfortable angle while keeping your heel on the floor.      IF YOU ARE TRANSFERRED TO A SKILLED REHAB FACILITY If the patient is transferred to a skilled rehab facility following release from the hospital, a list of the current medications will be sent to the facility for the patient to continue.  When discharged from the skilled rehab facility, please have the facility set up the patient's [place] prior to being released. Also, the skilled facility will be responsible for providing the patient with their medications at time of release from the facility to include their pain medication, the muscle relaxants, and their blood thinner medication. If the patient is still at the rehab facility at time of the two week follow up appointment, the skilled rehab facility will also need to assist the patient in arranging follow up appointment in our office and any transportation needs.  MAKE SURE YOU:  Understand these instructions.  Get help right away if you are not doing well or get worse.    Pick up stool softner and laxative for home use following surgery while on pain medications. Do not submerge incision under water. Please use good hand washing techniques while changing dressing each day. May shower starting three days after surgery. Please use a clean towel to pat the incision dry following showers. Continue to use ice for pain and swelling after surgery. Do not use any lotions or creams on the incision until instructed by your surgeon.  Take Xarelto for two and a half more weeks, then discontinue Xarelto. He is back on his aspirin at home.    Information on my medicine - XARELTO (Rivaroxaban)  This medication education was reviewed with me or my healthcare representative as part of my discharge preparation.  The pharmacist that spoke with me during my hospital stay was:  Leeroy Bock, [facility]  Why was  Xarelto prescribed for you? Xarelto was prescribed for you to reduce the risk of blood clots forming after orthopedic surgery. The medical term for these abnormal blood clots is venous thromboembolism (VTE).  What do you need to know about xarelto ? Take your Xarelto ONCE DAILY at the same time every day. You  may take it either with or without food.  If you have difficulty swallowing the tablet whole, you may crush it and mix in applesauce just prior to taking your dose.  Take Xarelto exactly as prescribed by your doctor and DO NOT stop taking Xarelto without talking to the doctor who prescribed the medication.  Stopping without other VTE prevention medication to take the place of Xarelto may increase your risk of developing a clot.  After discharge, you should have regular check-up appointments with your healthcare provider that is prescribing your Xarelto.    What do you do if you miss a dose? If you miss a dose, take it as soon as you remember on the same day then continue your regularly scheduled once daily regimen the next day. Do not take two doses of Xarelto on the same day.   Important Safety Information A possible side effect of Xarelto is bleeding. You should call your healthcare provider right away if you experience any of the following: ? Bleeding from an injury or your nose that does not stop. ? Unusual colored urine (red or dark brown) or unusual colored stools (red or black). ? Unusual bruising for unknown reasons. ? A serious fall or if you hit your head (even if there is no bleeding).  Some medicines may interact with Xarelto and might increase your risk of bleeding while on Xarelto. To help avoid this, consult your healthcare provider or pharmacist prior to using any new prescription or non-prescription medications, including herbals, vitamins, non-steroidal anti-inflammatory drugs (NSAIDs) and supplements.  This website has more information on Xarelto:  [email or web address]

## 2015-04-09 NOTE — Progress Notes (Signed)
Pt stated that he will self administer CPAP when ready for bed.  Current settings are 13 CMH20 via Pt home mask and tubing.  Pt to notify RT if any assistance is required throughout the night.  RT to monitor and assess as needed.

## 2015-04-10 ENCOUNTER — Encounter (HOSPITAL_COMMUNITY): Payer: Self-pay | Admitting: Orthopedic Surgery

## 2015-04-10 LAB — CBC
HEMATOCRIT: 38.9 % — AB (ref 39.0–52.0)
Hemoglobin: 12.6 g/dL — ABNORMAL LOW (ref 13.0–17.0)
MCH: 31.3 pg (ref 26.0–34.0)
MCHC: 32.4 g/dL (ref 30.0–36.0)
MCV: 96.5 fL (ref 78.0–100.0)
PLATELETS: 203 10*3/uL (ref 150–400)
RBC: 4.03 MIL/uL — ABNORMAL LOW (ref 4.22–5.81)
RDW: 13.1 % (ref 11.5–15.5)
WBC: 16.7 10*3/uL — AB (ref 4.0–10.5)

## 2015-04-10 LAB — BASIC METABOLIC PANEL
Anion gap: 6 (ref 5–15)
BUN: 13 mg/dL (ref 6–20)
CALCIUM: 8.6 mg/dL — AB (ref 8.9–10.3)
CO2: 31 mmol/L (ref 22–32)
CREATININE: 0.82 mg/dL (ref 0.61–1.24)
Chloride: 104 mmol/L (ref 101–111)
Glucose, Bld: 148 mg/dL — ABNORMAL HIGH (ref 65–99)
Potassium: 4.2 mmol/L (ref 3.5–5.1)
SODIUM: 141 mmol/L (ref 135–145)

## 2015-04-10 MED ORDER — RIVAROXABAN 10 MG PO TABS: 10.0000 mg | ORAL_TABLET | Freq: Every day | ORAL | Status: DC

## 2015-04-10 MED ORDER — TRAMADOL HCL 50 MG PO TABS: 50.0000 mg | ORAL_TABLET | Freq: Four times a day (QID) | ORAL | Status: DC | PRN

## 2015-04-10 MED ORDER — ASPIRIN EC 81 MG PO TBEC
81.0000 mg | DELAYED_RELEASE_TABLET | Freq: Every day | ORAL | Status: DC
  Administered 2015-04-10: 81 mg via ORAL
  Filled 2015-04-10: qty 1

## 2015-04-10 MED ORDER — METHOCARBAMOL 500 MG PO TABS: 500.0000 mg | ORAL_TABLET | Freq: Four times a day (QID) | ORAL | Status: DC | PRN

## 2015-04-10 MED ORDER — OXYCODONE HCL 10 MG PO TABS: 10.0000 mg | ORAL_TABLET | ORAL | Status: DC | PRN

## 2015-04-10 NOTE — Progress Notes (Signed)
OT Cancellation Note  Patient Details Name: Joseph [name]. MRN: [id number] DOB: [birth year]   Cancelled Treatment:    Reason Eval/Treat Not Completed: OT screened, no needs identified, will sign off.  Pt has had THPs in the past, has AE/DME and able to verbalize leaning backwards when lifting legs over tub bench.  No OT needed at this time  [facility] 04/10/2015, 11:05 AM  Lesle Chris, OTR/L [phone] 04/10/2015

## 2015-04-10 NOTE — Progress Notes (Signed)
Physical Therapy Treatment Patient Details Name: Joseph [name]. 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  Reviewed therex, car transfers, and stairs.  Follow Up Recommendations  Home health PT     Equipment Recommendations  None recommended by PT    Recommendations for Other Services OT consult     Precautions / Restrictions Precautions Precautions: Posterior Hip;Fall Restrictions Weight Bearing Restrictions: No Other Position/Activity Restrictions: WBAT    Mobility  Bed Mobility Overal bed mobility: Needs Assistance Bed Mobility: Supine to Sit;Sit to Supine     Supine to sit: Supervision Sit to supine: Supervision   General bed mobility comments: min cues for use of R LE - good adherence to THP  Transfers Overall transfer level: Needs assistance Equipment used: Rolling walker (2 wheeled) Transfers: Sit to/from Stand Sit to Stand: Supervision         General transfer comment: Good adherence to THP  Ambulation/Gait Ambulation/Gait assistance: Supervision Ambulation Distance (Feet): 30 Feet Assistive device: Rolling walker (2 wheeled) Gait Pattern/deviations: Step-to pattern;Step-through pattern;Decreased step length - right;Decreased step length - left;Shuffle;Trunk flexed Gait velocity: decr   General Gait Details: cues for sequence, posture, position from RW and ER on L.  Pt observed walking unassisted with RW   Stairs Stairs: Yes Stairs assistance: Min guard Stair Management: No rails;Step to pattern;Backwards;With walker Number of Stairs: 2 (single step twice.) General stair comments: min cues for foot placement  Wheelchair Mobility    Modified Rankin (Stroke Patients Only)       Balance                                    Cognition Arousal/Alertness:  Awake/alert Behavior During Therapy: WFL for tasks assessed/performed Overall Cognitive Status: Within Functional Limits for tasks assessed                      Exercises Total Joint Exercises Ankle Circles/Pumps: AROM;Both;15 reps;Supine Quad Sets: AROM;Both;10 reps;Supine Gluteal Sets: AROM;10 reps;Supine;Both Heel Slides: AAROM;Left;15 reps;Supine Hip ABduction/ADduction: AAROM;Left;Supine;15 reps    General Comments        Pertinent Vitals/Pain Pain Assessment: 0-10 Pain Score: 6  Pain Location: L hip Pain Descriptors / Indicators: Aching;Sore Pain Intervention(s): Limited activity within patient's tolerance;Monitored during session;Premedicated before session;Ice applied    Home Living                      Prior Function            PT Goals (current goals can now be found in the care plan section) Acute Rehab PT Goals Patient Stated Goal: Resume previous lifestyle with decreased pain PT Goal Formulation: With patient Time For Goal Achievement: 04/14/15 Potential to Achieve Goals: Good Progress towards PT goals: Progressing toward goals    Frequency  7X/week    PT Plan Current plan remains appropriate    Co-evaluation             End of Session   Activity Tolerance: Patient tolerated treatment well Patient left: in bed;with call bell/phone within reach;with family/visitor present     Time: 1103-1130 PT Time Calculation (min) (ACUTE ONLY): 27 min  Charges:  $Therapeutic Exercise: 8-22 mins $Therapeutic  Activity: 8-22 mins                    G Codes:      Joseph [name] 05/02/15, 12:57 PM

## 2015-04-10 NOTE — Progress Notes (Signed)
Patient's vitals WNL, tolerating diet and pain is under control. Discussed discharge instructions with both patient and wife. Neither had any questions nor concerns. Pt's HH/PT won't be out to patient's house until Monday. Spoke with Dian Situ and he is ok with this. Patient has exercises to do until PT comes to the pt's house on Monday.
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
## 2016-01-14 ENCOUNTER — Other Ambulatory Visit: Payer: Self-pay | Admitting: General Surgery
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
## 2016-02-17 ENCOUNTER — Ambulatory Visit: Payer: Worker's Compensation | Admitting: Neurology

## 2016-02-17 DIAGNOSIS — G4730 Sleep apnea, unspecified: Secondary | ICD-10-CM

## 2016-02-17 DIAGNOSIS — R5383 Other fatigue: Secondary | ICD-10-CM

## 2016-02-17 DIAGNOSIS — R0683 Snoring: Secondary | ICD-10-CM

## 2016-02-17 DIAGNOSIS — G4710 Hypersomnia, unspecified: Secondary | ICD-10-CM

## 2016-02-18 ENCOUNTER — Other Ambulatory Visit (HOSPITAL_BASED_OUTPATIENT_CLINIC_OR_DEPARTMENT_OTHER): Payer: Self-pay

## 2016-02-18 DIAGNOSIS — G4710 Hypersomnia, unspecified: Secondary | ICD-10-CM

## 2016-02-18 DIAGNOSIS — G4730 Sleep apnea, unspecified: Secondary | ICD-10-CM

## 2016-02-18 DIAGNOSIS — R0683 Snoring: Secondary | ICD-10-CM

## 2016-02-18 DIAGNOSIS — R5383 Other fatigue: Secondary | ICD-10-CM

## 2016-02-21 NOTE — Procedures (Signed)
[place] A. Merlene Laughter, MD     [email or web address]             NOCTURNAL POLYSOMNOGRAPHY   LOCATION: [facility]   Patient Name: Dewayne, Severe Date: 02/17/2016 Gender: Male D.O.B: 08-09-1965 Age (years): 50 Referring Provider: Caryl Bis Height (inches): 54 Interpreting Physician: Phillips Odor MD, ABSM Weight (lbs): 287 RPSGT: Peak, Robert BMI: 39 MRN: [id number] Neck Size: 19.50 CLINICAL INFORMATION Sleep Study Type: Split Night CPAP Indication for sleep study: Fatigue, Snoring, Witnessed Apneas Epworth Sleepiness Score: 13 SLEEP STUDY TECHNIQUE As per the AASM Manual for the Scoring of Sleep and Associated Events v2.3 (April 2016) with a hypopnea requiring 4% desaturations. The channels recorded and monitored were frontal, central and occipital EEG, electrooculogram (EOG), submentalis EMG (chin), nasal and oral airflow, thoracic and abdominal wall motion, anterior tibialis EMG, snore microphone, electrocardiogram, and pulse oximetry. Continuous positive airway pressure (CPAP) was initiated when the patient met split night criteria and was titrated according to treat sleep-disordered breathing. MEDICATIONS Medications taken by the patient : N/A Medications administered by patient during sleep study : No sleep medicine administered.  Current Outpatient Prescriptions:  .  aspirin 81 MG tablet, Take 81 mg by mouth daily., Disp: , Rfl:  .  finasteride (PROSCAR) 5 MG tablet, Take 5 mg by mouth at bedtime. , Disp: , Rfl:  .  iron polysaccharides (NIFEREX) 150 MG capsule, Take 1 capsule (150 mg total) by mouth 2 (two) times daily before lunch and supper. (Patient not taking: Reported on 03/31/2015), Disp: 60 capsule, Rfl: 1 .  levothyroxine (SYNTHROID, LEVOTHROID) 125 MCG tablet, Take 125 mcg by mouth daily before breakfast., Disp: , Rfl:  .  lisinopril (PRINIVIL,ZESTRIL) 10 MG tablet, Take 10 mg by mouth daily., Disp: , Rfl:  .  methocarbamol (ROBAXIN)  500 MG tablet, Take 1 tablet (500 mg total) by mouth every 6 (six) hours as needed for muscle spasms., Disp: 90 tablet, Rfl: 0 .  metoprolol succinate (TOPROL-XL) 25 MG 24 hr tablet, Take 25 mg by mouth daily., Disp: , Rfl:  .  nitroGLYCERIN (NITROSTAT) 0.4 MG SL tablet, Place 0.4 mg under the tongue every 5 (five) minutes as needed for chest pain., Disp: , Rfl:  .  oxyCODONE 10 MG TABS, Take 1-2 tablets (10-20 mg total) by mouth every 3 (three) hours as needed for moderate pain or severe pain., Disp: 120 tablet, Rfl: 0 .  pravastatin (PRAVACHOL) 40 MG tablet, Take 40 mg by mouth at bedtime. , Disp: , Rfl:  .  rivaroxaban (XARELTO) 10 MG TABS tablet, Take 1 tablet (10 mg total) by mouth daily with breakfast. Take Xarelto for two and a half more weeks, then discontinue Xarelto., Disp: 19 tablet, Rfl: 0 .  tamsulosin (FLOMAX) 0.4 MG CAPS capsule, Take 0.4 mg by mouth at bedtime. , Disp: , Rfl:  .  traMADol (ULTRAM) 50 MG tablet, Take 1-2 tablets (50-100 mg total) by mouth every 6 (six) hours as needed (mild pain)., Disp: 80 tablet, Rfl: 1  RESPIRATORY PARAMETERS Diagnostic Total AHI (/hr): 23.2 RDI (/hr): 34.6 OA Index (/hr): - CA Index (/hr): 0.0 REM AHI (/hr): 46.2 NREM AHI (/hr): 21.2 Supine AHI (/hr): 18.1 Non-supine AHI (/hr): 25.92 Min O2 Sat (%): 74.00 Mean O2 (%): 88.43 Time below 88% (min): 84.6     Titration Optimal Pressure (cm): 14 and 15 AHI at Optimal Pressure (/hr): 2.1 and 0.0 Min O2 at Optimal Pressure (%): 90 and 1.0 Supine % at Optimal (%): 82 Sleep %  at Optimal (%): 89     SLEEP ARCHITECTURE The recording time for the entire night was 468.8 minutes. During a baseline period of 193.4 minutes, the patient slept for 163.0 minutes in REM and nonREM, yielding a sleep efficiency of 84.3%. Sleep onset after lights out was 1.3 minutes with a REM latency of 161.5 minutes. The patient spent 16.56% of the night in stage N1 sleep, 75.46% in stage N2 sleep, 0.00% in stage N3 and 7.98% in  REM. During the titration period of 274.0 minutes, the patient slept for 195.5 minutes in REM and nonREM, yielding a sleep efficiency of 71.4%. Sleep onset after CPAP initiation was 29.0 minutes with a REM latency of 102.0 minutes. The patient spent 5.11% of the night in stage N1 sleep, 70.85% in stage N2 sleep, 0.00% in stage N3 and 24.04% in REM. CARDIAC DATA The 2 lead EKG demonstrated sinus rhythm. The mean heart rate was 58.16 beats per minute. Other EKG findings include: PVCs. LEG MOVEMENT DATA The total Periodic Limb Movements of Sleep (PLMS) were 0. The PLMS index was 0.00.    IMPRESSIONS - Moderate obstructive sleep apnea occurred during the diagnostic portion of the study(AHI = 23.2/hour). Optimal CPAP pressures were 14 and 15.  Delano Metz, MD Diplomate, American Board of Sleep Medicine.

## 2016-03-15 ENCOUNTER — Other Ambulatory Visit (HOSPITAL_COMMUNITY): Payer: Self-pay | Admitting: Orthopedic Surgery

## 2016-03-15 DIAGNOSIS — M25552 Pain in left hip: Secondary | ICD-10-CM

## 2016-03-28 ENCOUNTER — Encounter (HOSPITAL_COMMUNITY)
Admission: RE | Admit: 2016-03-28 | Discharge: 2016-03-28 | Disposition: A | Discharge status: Home / Self Care | Payer: BLUE CROSS/BLUE SHIELD | Source: Ambulatory Visit | Attending: Orthopedic Surgery | Admitting: Orthopedic Surgery

## 2016-03-28 DIAGNOSIS — M25552 Pain in left hip: Secondary | ICD-10-CM | POA: Insufficient documentation

## 2016-03-28 MED ORDER — TECHNETIUM TC 99M MEDRONATE IV KIT
22.0000 | PACK | Freq: Once | INTRAVENOUS | Status: AC | PRN
  Administered 2016-03-28: 22 via INTRAVENOUS

## 2016-08-31 ENCOUNTER — Telehealth: Payer: Self-pay | Admitting: Internal Medicine

## 2016-08-31 NOTE — Telephone Encounter (Signed)
Called and spoke to pt's wife. Pt's wife states pt is having a PFT as prescribed by pt's current pulmonologist, she is questioning if this should still be done. Advised pt's wife to still have test as it was prescribed by pt's current pulmonologist. Pt's wife verbalized understanding and denied any further questions or concerns at this time.

## 2016-09-01 LAB — PULMONARY FUNCTION TEST

## 2016-10-04 ENCOUNTER — Encounter (HOSPITAL_COMMUNITY): Payer: Self-pay | Admitting: Emergency Medicine

## 2016-10-04 ENCOUNTER — Emergency Department (HOSPITAL_COMMUNITY)
Admission: EM | Admit: 2016-10-04 | Discharge: 2016-10-04 | Disposition: A | Discharge status: Home / Self Care | Payer: BLUE CROSS/BLUE SHIELD | Attending: Emergency Medicine | Admitting: Emergency Medicine

## 2016-10-04 DIAGNOSIS — Z5321 Procedure and treatment not carried out due to patient leaving prior to being seen by health care provider: Secondary | ICD-10-CM | POA: Insufficient documentation

## 2016-10-04 DIAGNOSIS — R1032 Left lower quadrant pain: Secondary | ICD-10-CM | POA: Diagnosis present

## 2016-10-04 LAB — CBC
HCT: 44 % (ref 39.0–52.0)
HEMOGLOBIN: 15 g/dL (ref 13.0–17.0)
MCH: 32.2 pg (ref 26.0–34.0)
MCHC: 34.1 g/dL (ref 30.0–36.0)
MCV: 94.4 fL (ref 78.0–100.0)
Platelets: 190 10*3/uL (ref 150–400)
RBC: 4.66 MIL/uL (ref 4.22–5.81)
RDW: 13 % (ref 11.5–15.5)
WBC: 8.8 10*3/uL (ref 4.0–10.5)

## 2016-10-04 LAB — COMPREHENSIVE METABOLIC PANEL
ALT: 64 U/L — ABNORMAL HIGH (ref 17–63)
ANION GAP: 8 (ref 5–15)
AST: 38 U/L (ref 15–41)
Albumin: 4.6 g/dL (ref 3.5–5.0)
Alkaline Phosphatase: 58 U/L (ref 38–126)
BUN: 15 mg/dL (ref 6–20)
CALCIUM: 9.2 mg/dL (ref 8.9–10.3)
CHLORIDE: 103 mmol/L (ref 101–111)
CO2: 27 mmol/L (ref 22–32)
Creatinine, Ser: 0.83 mg/dL (ref 0.61–1.24)
GFR calc non Af Amer: >60 mL/min (ref >60)
Glucose, Bld: 151 mg/dL — ABNORMAL HIGH (ref 65–99)
Potassium: 4 mmol/L (ref 3.5–5.1)
SODIUM: 138 mmol/L (ref 135–145)
Total Bilirubin: 1.1 mg/dL (ref 0.3–1.2)
Total Protein: 8 g/dL (ref 6.5–8.1)

## 2016-10-04 LAB — LIPASE, BLOOD: LIPASE: 21 U/L (ref 11–51)

## 2016-10-04 NOTE — ED Notes (Addendum)
Pt family threw labels at registration attendant stating, "guess we are leaving now!" and walked out with pt.  RN notified.

## 2016-10-04 NOTE — ED Triage Notes (Signed)
Pt c/o LLQ abdominal pain, pt states tender to touch and stabbing, worsening since yesterday, c/o vomiting today. Pt states he takes oxycodone 20mg  po for hip / ankle pain q4 hours and has some relief with meds.

## 2016-10-10 ENCOUNTER — Institutional Professional Consult (permissible substitution): Payer: Self-pay | Admitting: Internal Medicine

## 2016-10-11 ENCOUNTER — Encounter: Payer: Self-pay | Admitting: Pulmonary Disease

## 2016-10-11 ENCOUNTER — Ambulatory Visit (INDEPENDENT_AMBULATORY_CARE_PROVIDER_SITE_OTHER): Payer: BLUE CROSS/BLUE SHIELD | Admitting: Pulmonary Disease

## 2016-10-11 VITALS — BP 110/70 | HR 54 | Ht 71.0 in | Wt 283.0 lb

## 2016-10-11 DIAGNOSIS — G4730 Sleep apnea, unspecified: Secondary | ICD-10-CM

## 2016-10-11 NOTE — Progress Notes (Signed)
   Subjective:    Patient ID: Joseph Mould., male    DOB: 13-Oct-1965, 51 y.o.   MRN: [id number]  HPI    Review of Systems  Constitutional: Positive for unexpected weight change. Negative for fever.  HENT: Negative for congestion, dental problem, ear pain, nosebleeds, postnasal drip, rhinorrhea, sinus pressure, sneezing, sore throat and trouble swallowing.   Eyes: Negative for redness and itching.  Respiratory: Positive for shortness of breath. Negative for cough, chest tightness and wheezing.   Cardiovascular: Positive for chest pain and palpitations ( irregular beats). Negative for leg swelling.  Gastrointestinal: Positive for abdominal pain. Negative for nausea and vomiting.  Genitourinary: Negative for dysuria.  Musculoskeletal: Positive for joint swelling.  Skin: Negative for rash.  Neurological: Negative for headaches.  Hematological: Does not bruise/bleed easily.  Psychiatric/Behavioral: Negative for dysphoric mood. The patient is nervous/anxious.        Objective:   Physical Exam        Assessment & Plan:

## 2016-10-11 NOTE — Patient Instructions (Signed)
Please have CPAP download faxed to our office  Will arrange for overnight oxygen test with you using CPAP  Follow up in 3 months
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
## 2016-10-18 ENCOUNTER — Institutional Professional Consult (permissible substitution): Payer: Self-pay | Admitting: Internal Medicine

## 2016-10-19 ENCOUNTER — Encounter: Payer: Self-pay | Admitting: Pulmonary Disease

## 2016-10-25 ENCOUNTER — Telehealth: Payer: Self-pay | Admitting: Pulmonary Disease

## 2016-10-25 ENCOUNTER — Encounter: Payer: Self-pay | Admitting: Pulmonary Disease

## 2016-10-25 DIAGNOSIS — G4733 Obstructive sleep apnea (adult) (pediatric): Secondary | ICD-10-CM

## 2016-10-25 NOTE — Telephone Encounter (Signed)
ONO with CPAP 10/19/16 >> test time 10 hrs 11 min.  Baseline SpO2 89.9%, low SpO2 84%.  Spent 1 hr 39 min with SpO2 < 88%.   Spoke with pt over phone.   Will arrange for in lab titration study starting with CPAP and transition to Bipap +/- supplemental oxygen as needed.

## 2016-11-07 ENCOUNTER — Ambulatory Visit: Payer: BLUE CROSS/BLUE SHIELD | Attending: Pulmonary Disease | Admitting: Pulmonary Disease

## 2016-11-07 DIAGNOSIS — G4733 Obstructive sleep apnea (adult) (pediatric): Secondary | ICD-10-CM | POA: Diagnosis present

## 2016-11-10 ENCOUNTER — Telehealth: Payer: Self-pay | Admitting: Pulmonary Disease

## 2016-11-10 DIAGNOSIS — G4733 Obstructive sleep apnea (adult) (pediatric): Secondary | ICD-10-CM

## 2016-11-10 NOTE — Telephone Encounter (Signed)
CPAP titration 11/07/16 >> CPAP 11 cm H2O >> AHI 0.4, didn't need supplemental oxygen.   Will have my nurse inform pt that CPAP study looked good.  Optimal setting CPAP 11 cm H2O, and he didn't need supplemental oxygen.  Please arrange for his CPAP to be set at 11 cm H2O.

## 2016-11-10 NOTE — Procedures (Signed)
   Patient Name: Joseph [name], Joseph [name] Date: 11/07/2016 Gender: Male D.O.B: 06/11/1966 Age (years): 27 Referring Provider: Chesley Mires MD, ABSM Height (inches): 72 Interpreting Physician: Chesley Mires MD, ABSM Weight (lbs): 287 RPSGT: Rosebud Poles BMI: 39 MRN: [id number] Neck Size: 19.50  CLINICAL INFORMATION The patient is referred for a CPAP titration to treat sleep apnea.  Date of NPSG, Split Night or HST: 02/17/16, AHI 23.  SLEEP STUDY TECHNIQUE As per the AASM Manual for the Scoring of Sleep and Associated Events v2.3 (April 2016) with a hypopnea requiring 4% desaturations.  The channels recorded and monitored were frontal, central and occipital EEG, electrooculogram (EOG), submentalis EMG (chin), nasal and oral airflow, thoracic and abdominal wall motion, anterior tibialis EMG, snore microphone, electrocardiogram, and pulse oximetry. Continuous positive airway pressure (CPAP) was initiated at the beginning of the study and titrated to treat sleep-disordered breathing.  MEDICATIONS Medications self-administered by patient taken the night of the study : N/A  TECHNICIAN COMMENTS Comments added by technician: Patient tolerated CPAP very well. Titration was started at 5 cm of H20 and increased to 12 cm of H20 due to snoring and events. CPAP pressure was decreased to 11 cm of H20, due to central-like events occurring and patients' arousals Comments added by scorer: N/A  RESPIRATORY PARAMETERS Optimal PAP Pressure (cm): 11 AHI at Optimal Pressure (/hr): 0.4 Overall Minimal O2 (%): 85.00 Supine % at Optimal Pressure (%): 54 Minimal O2 at Optimal Pressure (%): 87.0    SLEEP ARCHITECTURE The study was initiated at 11:29:04 PM and ended at 6:29:04 AM.  Sleep onset time was 8.3 minutes and the sleep efficiency was 86.7%. The total sleep time was 364.2 minutes.  The patient spent 2.75% of the night in stage N1 sleep, 37.34% in stage N2 sleep, 16.95% in stage N3 and 42.97% in  REM.Stage REM latency was 107.5 minutes  Wake after sleep onset was 47.5. Alpha intrusion was absent. Supine sleep was 70.62%.  CARDIAC DATA The 2 lead EKG demonstrated sinus rhythm. The mean heart rate was N/A beats per minute. Other EKG findings include: PVCs.  LEG MOVEMENT DATA The total Periodic Limb Movements of Sleep (PLMS) were 0. The PLMS index was 0.00. A PLMS index of <15 is considered normal in adults.  IMPRESSIONS - The optimal PAP pressure was 11 cm of water.  He did not require supplemental oxygen once he got to adequate CPAP setting.  DIAGNOSIS - Obstructive Sleep Apnea (327.23 [G47.33 ICD-10])  RECOMMENDATIONS - Trial of CPAP therapy on 11 cm H2O. - He as fitted with a Large size Fisher&Paykel Nasal Mask Eson mask and heated humidification.  [Electronically signed] 11/10/2016 03:14 PM  Chesley Mires MD, [place], American Board of Sleep Medicine   NPI: [id number]

## 2016-11-11 NOTE — Telephone Encounter (Signed)
Pt is aware of results and order placed.  

## 2017-01-26 ENCOUNTER — Ambulatory Visit: Payer: Self-pay | Admitting: Pulmonary Disease

## 2017-07-25 ENCOUNTER — Other Ambulatory Visit: Payer: Self-pay | Admitting: Physician Assistant

## 2017-10-17 ENCOUNTER — Other Ambulatory Visit: Payer: Self-pay | Admitting: Family Medicine

## 2017-10-17 DIAGNOSIS — R7989 Other specified abnormal findings of blood chemistry: Secondary | ICD-10-CM

## 2017-10-17 DIAGNOSIS — R945 Abnormal results of liver function studies: Principal | ICD-10-CM

## 2017-10-27 ENCOUNTER — Ambulatory Visit
Admission: RE | Admit: 2017-10-27 | Discharge: 2017-10-27 | Disposition: A | Discharge status: Home / Self Care | Payer: Self-pay | Source: Ambulatory Visit | Attending: Family Medicine | Admitting: Family Medicine

## 2017-10-27 DIAGNOSIS — R7989 Other specified abnormal findings of blood chemistry: Secondary | ICD-10-CM

## 2017-10-27 DIAGNOSIS — R945 Abnormal results of liver function studies: Principal | ICD-10-CM

## 2018-02-06 ENCOUNTER — Ambulatory Visit: Payer: Commercial Managed Care - PPO | Admitting: Allergy and Immunology

## 2018-08-06 DIAGNOSIS — G4733 Obstructive sleep apnea (adult) (pediatric): Secondary | ICD-10-CM | POA: Diagnosis not present

## 2018-08-16 DIAGNOSIS — Z79891 Long term (current) use of opiate analgesic: Secondary | ICD-10-CM | POA: Diagnosis not present

## 2018-08-16 DIAGNOSIS — G4733 Obstructive sleep apnea (adult) (pediatric): Secondary | ICD-10-CM | POA: Diagnosis not present

## 2018-08-16 DIAGNOSIS — E782 Mixed hyperlipidemia: Secondary | ICD-10-CM | POA: Diagnosis not present

## 2018-08-16 DIAGNOSIS — E039 Hypothyroidism, unspecified: Secondary | ICD-10-CM | POA: Diagnosis not present

## 2018-08-28 DIAGNOSIS — E782 Mixed hyperlipidemia: Secondary | ICD-10-CM | POA: Diagnosis not present

## 2018-08-28 DIAGNOSIS — G4733 Obstructive sleep apnea (adult) (pediatric): Secondary | ICD-10-CM | POA: Diagnosis not present

## 2018-08-28 DIAGNOSIS — Z79891 Long term (current) use of opiate analgesic: Secondary | ICD-10-CM | POA: Diagnosis not present

## 2018-08-28 DIAGNOSIS — E039 Hypothyroidism, unspecified: Secondary | ICD-10-CM | POA: Diagnosis not present

## 2018-08-28 DIAGNOSIS — G252 Other specified forms of tremor: Secondary | ICD-10-CM | POA: Diagnosis not present

## 2018-08-28 DIAGNOSIS — I10 Essential (primary) hypertension: Secondary | ICD-10-CM | POA: Diagnosis not present

## 2018-11-05 DIAGNOSIS — L03311 Cellulitis of abdominal wall: Secondary | ICD-10-CM | POA: Diagnosis not present

## 2018-11-08 ENCOUNTER — Other Ambulatory Visit: Payer: Self-pay

## 2018-11-08 NOTE — Patient Outreach (Signed)
[facility]) Care Management  11/08/2018  Quade Ramirez [birth year] [phone]   Medication Adherence call to Mr. Abhimanyu Cruces [facility] Verify spoke with patient he is due on Lisinopril 10 mg patient explain he is taking 1 tablet daily and has pick up from [facility]  yesterday. Mr. Terral is showing past due under [place].   [place] Management Direct Dial [phone]  Fax [phone] [email or web address]

## 2018-11-27 DIAGNOSIS — E039 Hypothyroidism, unspecified: Secondary | ICD-10-CM | POA: Diagnosis not present

## 2018-11-27 DIAGNOSIS — Z79891 Long term (current) use of opiate analgesic: Secondary | ICD-10-CM | POA: Diagnosis not present

## 2018-11-27 DIAGNOSIS — K760 Fatty (change of) liver, not elsewhere classified: Secondary | ICD-10-CM | POA: Diagnosis not present

## 2018-11-27 DIAGNOSIS — I10 Essential (primary) hypertension: Secondary | ICD-10-CM | POA: Diagnosis not present

## 2018-11-27 DIAGNOSIS — R945 Abnormal results of liver function studies: Secondary | ICD-10-CM | POA: Diagnosis not present

## 2018-11-28 DIAGNOSIS — G252 Other specified forms of tremor: Secondary | ICD-10-CM | POA: Diagnosis not present

## 2018-11-28 DIAGNOSIS — I10 Essential (primary) hypertension: Secondary | ICD-10-CM | POA: Diagnosis not present

## 2018-11-28 DIAGNOSIS — I25111 Atherosclerotic heart disease of native coronary artery with angina pectoris with documented spasm: Secondary | ICD-10-CM | POA: Diagnosis not present

## 2018-11-28 DIAGNOSIS — E039 Hypothyroidism, unspecified: Secondary | ICD-10-CM | POA: Diagnosis not present

## 2018-11-28 DIAGNOSIS — M19171 Post-traumatic osteoarthritis, right ankle and foot: Secondary | ICD-10-CM | POA: Diagnosis not present

## 2018-11-28 DIAGNOSIS — G4733 Obstructive sleep apnea (adult) (pediatric): Secondary | ICD-10-CM | POA: Diagnosis not present

## 2018-12-13 DIAGNOSIS — G4733 Obstructive sleep apnea (adult) (pediatric): Secondary | ICD-10-CM | POA: Diagnosis not present

## 2019-01-07 DIAGNOSIS — M9903 Segmental and somatic dysfunction of lumbar region: Secondary | ICD-10-CM | POA: Diagnosis not present

## 2019-01-07 DIAGNOSIS — G4733 Obstructive sleep apnea (adult) (pediatric): Secondary | ICD-10-CM | POA: Diagnosis not present

## 2019-01-09 DIAGNOSIS — M9903 Segmental and somatic dysfunction of lumbar region: Secondary | ICD-10-CM | POA: Diagnosis not present

## 2019-02-17 ENCOUNTER — Emergency Department (HOSPITAL_COMMUNITY)
Admission: EM | Admit: 2019-02-17 | Discharge: 2019-02-17 | Disposition: A | Discharge status: Home / Self Care | Payer: Medicare Other | Attending: Emergency Medicine | Admitting: Emergency Medicine

## 2019-02-17 ENCOUNTER — Encounter (HOSPITAL_COMMUNITY): Payer: Self-pay

## 2019-02-17 ENCOUNTER — Other Ambulatory Visit: Payer: Self-pay

## 2019-02-17 DIAGNOSIS — T7840XA Allergy, unspecified, initial encounter: Secondary | ICD-10-CM | POA: Insufficient documentation

## 2019-02-17 DIAGNOSIS — Z7982 Long term (current) use of aspirin: Secondary | ICD-10-CM | POA: Diagnosis not present

## 2019-02-17 DIAGNOSIS — R079 Chest pain, unspecified: Secondary | ICD-10-CM | POA: Diagnosis not present

## 2019-02-17 DIAGNOSIS — R0602 Shortness of breath: Secondary | ICD-10-CM | POA: Diagnosis not present

## 2019-02-17 DIAGNOSIS — Z79899 Other long term (current) drug therapy: Secondary | ICD-10-CM | POA: Insufficient documentation

## 2019-02-17 DIAGNOSIS — Z87891 Personal history of nicotine dependence: Secondary | ICD-10-CM | POA: Diagnosis not present

## 2019-02-17 DIAGNOSIS — E039 Hypothyroidism, unspecified: Secondary | ICD-10-CM | POA: Insufficient documentation

## 2019-02-17 DIAGNOSIS — I10 Essential (primary) hypertension: Secondary | ICD-10-CM | POA: Diagnosis not present

## 2019-02-17 DIAGNOSIS — Z96642 Presence of left artificial hip joint: Secondary | ICD-10-CM | POA: Insufficient documentation

## 2019-02-17 DIAGNOSIS — I259 Chronic ischemic heart disease, unspecified: Secondary | ICD-10-CM | POA: Diagnosis not present

## 2019-02-17 DIAGNOSIS — Z7901 Long term (current) use of anticoagulants: Secondary | ICD-10-CM | POA: Diagnosis not present

## 2019-02-17 DIAGNOSIS — I447 Left bundle-branch block, unspecified: Secondary | ICD-10-CM | POA: Diagnosis not present

## 2019-02-17 DIAGNOSIS — R069 Unspecified abnormalities of breathing: Secondary | ICD-10-CM | POA: Diagnosis not present

## 2019-02-17 DIAGNOSIS — R0689 Other abnormalities of breathing: Secondary | ICD-10-CM | POA: Diagnosis not present

## 2019-02-17 DIAGNOSIS — L299 Pruritus, unspecified: Secondary | ICD-10-CM | POA: Diagnosis present

## 2019-02-17 MED ORDER — EPINEPHRINE 0.3 MG/0.3ML IJ SOAJ: 0.3000 mg | INTRAMUSCULAR | 2 refills | Status: DC | PRN

## 2019-02-17 MED ORDER — FAMOTIDINE IN NACL 20-0.9 MG/50ML-% IV SOLN
20.0000 mg | Freq: Once | INTRAVENOUS | Status: AC
  Administered 2019-02-17: 20 mg via INTRAVENOUS
  Filled 2019-02-17: qty 50

## 2019-02-17 MED ORDER — METHYLPREDNISOLONE SODIUM SUCC 125 MG IJ SOLR
125.0000 mg | Freq: Once | INTRAMUSCULAR | Status: AC
  Administered 2019-02-17: 125 mg via INTRAVENOUS
  Filled 2019-02-17: qty 2

## 2019-02-17 MED ORDER — SODIUM CHLORIDE 0.9 % IV BOLUS
500.0000 mL | Freq: Once | INTRAVENOUS | Status: AC
Start: 2019-02-17 — End: 2019-02-17
  Administered 2019-02-17: 500 mL via INTRAVENOUS

## 2019-02-17 MED ORDER — PREDNISONE 20 MG PO TABS: ORAL_TABLET | ORAL | 0 refills | Status: DC

## 2019-02-17 MED ORDER — DIPHENHYDRAMINE HCL 50 MG/ML IJ SOLN
50.0000 mg | Freq: Once | INTRAMUSCULAR | Status: AC
Start: 2019-02-17 — End: 2019-02-17
  Administered 2019-02-17: 50 mg via INTRAVENOUS
  Filled 2019-02-17: qty 1

## 2019-02-17 NOTE — ED Provider Notes (Signed)
Care assumed from Dr. Betsey Holiday.  Patient had an anaphylactic reaction to unknown source overnight and self administered epinephrine.  Plan of care is to watch patient until around 9 to 9:15 AM and if he is doing better, discharge home.  On my assessment just now, patient is feeling better and has no wheezing, difficulty breathing, and all rash has resolved.  He would like to go home now.  Patient will give prescription for EpiPen and prednisone and can take Benadryl for itching or rash.  He understood return precautions and plan of care and will also follow-up with PCP for further allergy testing.  He had no other questions or concerns and was discharged in good condition.   Clinical Impression: 1. Allergic reaction, initial encounter     Disposition: Discharge  Condition: Good  I have discussed the results, Dx and Tx plan with the pt(& family if present). He/she/they expressed understanding and agree(s) with the plan. Discharge instructions discussed at great length. Strict return precautions discussed and pt &/or family have verbalized understanding of the instructions. No further questions at time of discharge.    New Prescriptions   EPINEPHRINE (EPIPEN 2-PAK) 0.3 MG/0.3 ML IJ SOAJ INJECTION    Inject 0.3 mLs (0.3 mg total) into the muscle as needed for anaphylaxis.   PREDNISONE (DELTASONE) 20 MG TABLET    3 tabs po daily x 3 days, then 2 tabs x 3 days, then 1.5 tabs x 3 days, then 1 tab x 3 days, then 0.5 tabs x 3 days    Follow Up: Caryl Bis, MD [address] [place] [address] [phone]     [place] [address] [id number] mc [place]       Myrth Dahan, Gwenyth Allegra, MD 02/17/19 [phone]

## 2019-02-17 NOTE — ED Triage Notes (Signed)
Pt presents via [facility] due to allergic reaction. Pt states who woke up suddenly last night feeling SOB similar to a previous allergic reaction. Pt immediately administered EpiPen and 50mg  of PO Benadryl. On arrival SOB has subsidized, alert, oriented x4. Only known allergy penicillin.

## 2019-02-17 NOTE — ED Notes (Signed)
Patient verbalizes understanding of discharge instructions. Opportunity for questioning and answering were provided.  patient discharged from ED.  
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
## 2019-02-17 NOTE — Discharge Instructions (Addendum)
Take Benadryl 2 tablets every 6 hours for the next 2 days then as needed for any itching or rash.  Fill the prescription for prednisone and take as prescribed.  Administer EpiPen and call [phone] for any further reactions.

## 2019-03-07 DIAGNOSIS — E782 Mixed hyperlipidemia: Secondary | ICD-10-CM | POA: Diagnosis not present

## 2019-03-07 DIAGNOSIS — R945 Abnormal results of liver function studies: Secondary | ICD-10-CM | POA: Diagnosis not present

## 2019-03-07 DIAGNOSIS — E039 Hypothyroidism, unspecified: Secondary | ICD-10-CM | POA: Diagnosis not present

## 2019-03-07 DIAGNOSIS — I10 Essential (primary) hypertension: Secondary | ICD-10-CM | POA: Diagnosis not present

## 2019-04-26 DIAGNOSIS — R509 Fever, unspecified: Secondary | ICD-10-CM | POA: Diagnosis not present

## 2019-04-26 DIAGNOSIS — R05 Cough: Secondary | ICD-10-CM | POA: Diagnosis not present

## 2019-05-03 DIAGNOSIS — G4733 Obstructive sleep apnea (adult) (pediatric): Secondary | ICD-10-CM | POA: Diagnosis not present

## 2019-05-08 DIAGNOSIS — U071 COVID-19: Secondary | ICD-10-CM | POA: Diagnosis not present

## 2019-05-27 ENCOUNTER — Ambulatory Visit: Payer: Commercial Managed Care - PPO | Admitting: Allergy
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
## 2019-05-28 DIAGNOSIS — Z0001 Encounter for general adult medical examination with abnormal findings: Secondary | ICD-10-CM | POA: Diagnosis not present

## 2019-05-28 DIAGNOSIS — I10 Essential (primary) hypertension: Secondary | ICD-10-CM | POA: Diagnosis not present

## 2019-05-28 DIAGNOSIS — G252 Other specified forms of tremor: Secondary | ICD-10-CM | POA: Diagnosis not present

## 2019-05-28 DIAGNOSIS — I25111 Atherosclerotic heart disease of native coronary artery with angina pectoris with documented spasm: Secondary | ICD-10-CM | POA: Diagnosis not present

## 2019-05-28 DIAGNOSIS — G4733 Obstructive sleep apnea (adult) (pediatric): Secondary | ICD-10-CM | POA: Diagnosis not present

## 2019-05-28 DIAGNOSIS — E039 Hypothyroidism, unspecified: Secondary | ICD-10-CM | POA: Diagnosis not present

## 2019-05-28 DIAGNOSIS — Z79891 Long term (current) use of opiate analgesic: Secondary | ICD-10-CM | POA: Diagnosis not present

## 2019-05-28 DIAGNOSIS — Z23 Encounter for immunization: Secondary | ICD-10-CM | POA: Diagnosis not present

## 2019-05-28 DIAGNOSIS — E782 Mixed hyperlipidemia: Secondary | ICD-10-CM | POA: Diagnosis not present

## 2019-06-02 DIAGNOSIS — G4733 Obstructive sleep apnea (adult) (pediatric): Secondary | ICD-10-CM | POA: Diagnosis not present

## 2019-06-17 ENCOUNTER — Encounter: Payer: Self-pay | Admitting: Allergy

## 2019-06-17 ENCOUNTER — Ambulatory Visit: Payer: Medicare Other | Admitting: Allergy

## 2019-06-17 ENCOUNTER — Other Ambulatory Visit: Payer: Self-pay

## 2019-06-17 VITALS — BP 138/86 | HR 70 | Temp 98.2°F | Resp 18 | Ht 72.0 in | Wt 310.6 lb

## 2019-06-17 DIAGNOSIS — T783XXA Angioneurotic edema, initial encounter: Secondary | ICD-10-CM | POA: Insufficient documentation

## 2019-06-17 DIAGNOSIS — Z880 Allergy status to penicillin: Secondary | ICD-10-CM | POA: Insufficient documentation

## 2019-06-17 DIAGNOSIS — T7840XD Allergy, unspecified, subsequent encounter: Secondary | ICD-10-CM | POA: Diagnosis not present

## 2019-06-17 DIAGNOSIS — T783XXD Angioneurotic edema, subsequent encounter: Secondary | ICD-10-CM | POA: Diagnosis not present

## 2019-06-17 DIAGNOSIS — T7840XA Allergy, unspecified, initial encounter: Secondary | ICD-10-CM | POA: Insufficient documentation

## 2019-06-17 NOTE — Assessment & Plan Note (Signed)
.   See assessment and plan as above. 

## 2019-06-17 NOTE — Patient Instructions (Addendum)
Stop lisinopril as it can decrease your threshold for having these episodes. Follow up with your PCP regarding changing your blood pressure medication.    For mild symptoms you can take over the counter antihistamines such as Benadryl and monitor symptoms closely. If symptoms worsen or if you have severe symptoms including breathing issues, throat closure, significant swelling, whole body hives, severe diarrhea and vomiting, lightheadedness then seek immediate medical care.  Emergency action plan given.   If you have another episode let us know.  Avoid the following potential triggers: alcohol, tight clothing, NSAIDs.   . Get bloodwork:  o We are ordering labs, so please allow 1-2 weeks for the results to come back. o With the newly implemented Cures Act, the labs might be visible to you at the same time that they become visible to me. However, I will not address the results until all of the results are back, so please be patient.  o In the meantime, continue recommendations in your patient instructions, including avoidance measures (if applicable), until you hear from me.  Follow up in 6 months or sooner if needed.

## 2019-06-17 NOTE — Assessment & Plan Note (Signed)
Patient had 2 episodes of reactions with pruritic feet, hands and lip angioedema within the past year. No triggers identified. The first reaction may have been to a new coating on his oxycodone pills but currently takes oxycodone with no issues. The second reaction with no triggers. Symptoms resolved within a few hours after epinephrine, solumedrol, antihistamines and IVF. Denies changes in diet. Does take OTC tylenol and NSAIDs daily for pain control. No alcohol use. Up to date with colonoscopy. Angioedema may be isolated or associated with urticaria. It may be histamine-induced (i.e. foods or certain medications) or bradykinin-mediated (i.e. Hereditary Angioedema or ACE-I). Patients with histamine-induced angioedema are more likely to have associated symptoms of urticaria, pruritus or signs of anaphylaxis. Patients with bradykinin-mediated do not have any symptoms of pruritus or urticaria and they do not improve with Benadryl or Epinephrine. ACE-inhibitors are notorious to cause angioedema and has an incidence of 0.1-0.7% and orolingual involvement is common. ACE is involved in the degradation of bradykinin and blocking it can cause increase in bradykinin resulting in angioedema. Discontinue Lisinopril and avoid all ACE-inhibitors in the future. Advised patient to contact PCP about adding a different medication to control his hypertension.  Try to avoid NSAIDs and alcohol if possible as they have been known to lower threshold for angioedema.   For mild symptoms you can take over the counter antihistamines such as Benadryl and monitor symptoms closely. If symptoms worsen or if you have severe symptoms including breathing issues, throat closure, significant swelling, whole body hives, severe diarrhea and vomiting, lightheadedness then seek immediate medical care.  Emergency action plan given.   If you have another episode let us know.  . Get bloodwork for further evaluation. . No indication for any skin  prick testing today. . If episodes become more frequent and severe discussed starting a daily antihistamine as a preventative measure.
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
## 2019-06-17 NOTE — Addendum Note (Signed)
Addended by: Garnet Sierras on: 06/17/2019 12:19 PM   Modules accepted: Level of Service

## 2019-06-26 DIAGNOSIS — M79671 Pain in right foot: Secondary | ICD-10-CM | POA: Diagnosis not present

## 2019-06-26 LAB — COMPREHENSIVE METABOLIC PANEL
ALT: 96 IU/L — ABNORMAL HIGH (ref 0–44)
AST: 62 IU/L — ABNORMAL HIGH (ref 0–40)
Albumin/Globulin Ratio: 1.7 (ref 1.2–2.2)
Albumin: 4.4 g/dL (ref 3.8–4.9)
Alkaline Phosphatase: 51 IU/L (ref 39–117)
BUN/Creatinine Ratio: 22 — ABNORMAL HIGH (ref 9–20)
BUN: 19 mg/dL (ref 6–24)
Bilirubin Total: 0.4 mg/dL (ref 0.0–1.2)
CO2: 27 mmol/L (ref 20–29)
Calcium: 9.5 mg/dL (ref 8.7–10.2)
Chloride: 104 mmol/L (ref 96–106)
Creatinine, Ser: 0.87 mg/dL (ref 0.76–1.27)
GFR calc Af Amer: 112 mL/min/{1.73_m2} (ref >59)
GFR calc non Af Amer: 97 mL/min/{1.73_m2} (ref >59)
Globulin, Total: 2.6 g/dL (ref 1.5–4.5)
Glucose: 108 mg/dL — ABNORMAL HIGH (ref 65–99)
Potassium: 4 mmol/L (ref 3.5–5.2)
Sodium: 145 mmol/L — ABNORMAL HIGH (ref 134–144)
Total Protein: 7 g/dL (ref 6.0–8.5)

## 2019-06-26 LAB — PROTEIN ELECTROPHORESIS, URINE REFLEX
Albumin ELP, Urine: 100 %
Alpha-1-Globulin, U: 0 %
Alpha-2-Globulin, U: 0 %
Beta Globulin, U: 0 %
Gamma Globulin, U: 0 %
Protein, Ur: 7.8 mg/dL

## 2019-06-26 LAB — PROTEIN ELECTROPHORESIS, SERUM
A/G Ratio: 1.2 (ref 0.7–1.7)
Albumin ELP: 3.8 g/dL (ref 2.9–4.4)
Alpha 1: 0.2 g/dL (ref 0.0–0.4)
Alpha 2: 0.8 g/dL (ref 0.4–1.0)
Beta: 1 g/dL (ref 0.7–1.3)
Gamma Globulin: 1.3 g/dL (ref 0.4–1.8)
Globulin, Total: 3.2 g/dL (ref 2.2–3.9)

## 2019-06-26 LAB — CBC WITH DIFFERENTIAL/PLATELET
Basophils Absolute: 0.1 10*3/uL (ref 0.0–0.2)
Basos: 1 %
EOS (ABSOLUTE): 0.1 10*3/uL (ref 0.0–0.4)
Eos: 1 %
Hematocrit: 43.7 % (ref 37.5–51.0)
Hemoglobin: 15 g/dL (ref 13.0–17.7)
Immature Grans (Abs): 0 10*3/uL (ref 0.0–0.1)
Immature Granulocytes: 0 %
Lymphocytes Absolute: 2 10*3/uL (ref 0.7–3.1)
Lymphs: 32 %
MCH: 33.1 pg — ABNORMAL HIGH (ref 26.6–33.0)
MCHC: 34.3 g/dL (ref 31.5–35.7)
MCV: 97 fL (ref 79–97)
Monocytes Absolute: 0.5 10*3/uL (ref 0.1–0.9)
Monocytes: 8 %
Neutrophils Absolute: 3.6 10*3/uL (ref 1.4–7.0)
Neutrophils: 58 %
Platelets: 187 10*3/uL (ref 150–450)
RBC: 4.53 x10E6/uL (ref 4.14–5.80)
RDW: 12.6 % (ref 11.6–15.4)
WBC: 6.2 10*3/uL (ref 3.4–10.8)

## 2019-06-26 LAB — CHRONIC URTICARIA: cu index: 4.2 (ref <10)

## 2019-06-26 LAB — ALPHA-GAL PANEL
Alpha Gal IgE*: 15.9 kU/L — ABNORMAL HIGH (ref <0.10)
Beef (Bos spp) IgE: 5.18 kU/L — ABNORMAL HIGH (ref <0.35)
Class Interpretation: 2
Class Interpretation: 2
Class Interpretation: 3
Lamb/Mutton (Ovis spp) IgE: 0.91 kU/L — ABNORMAL HIGH (ref <0.35)
Pork (Sus spp) IgE: 0.95 kU/L — ABNORMAL HIGH (ref <0.35)

## 2019-06-26 LAB — C1 ESTERASE INHIBITOR: C1INH SerPl-mCnc: 22 mg/dL (ref 21–39)

## 2019-06-26 LAB — C3 AND C4
Complement C3, Serum: 135 mg/dL (ref 82–167)
Complement C4, Serum: 17 mg/dL (ref 12–38)

## 2019-06-26 LAB — TRYPTASE: Tryptase: 5.1 ug/L (ref 2.2–13.2)

## 2019-06-26 LAB — C1 ESTERASE INHIBITOR, FUNCTIONAL: C1INH Functional/C1INH Total MFr SerPl: 86 %mean normal

## 2019-06-26 LAB — SEDIMENTATION RATE: Sed Rate: 18 mm/hr (ref 0–30)

## 2019-06-26 LAB — ALLERGEN FIRE ANT: I070-IgE Fire Ant (Invicta): 0.11 kU/L — AB

## 2019-06-26 LAB — ANA W/REFLEX: Anti Nuclear Antibody (ANA): NEGATIVE

## 2019-06-26 LAB — COMPLEMENT COMPONENT C1Q: Complement C1Q: 13.4 mg/dL (ref 10.2–20.3)

## 2019-06-26 LAB — C-REACTIVE PROTEIN: CRP: 2 mg/L (ref 0–10)

## 2019-07-03 DIAGNOSIS — G4733 Obstructive sleep apnea (adult) (pediatric): Secondary | ICD-10-CM | POA: Diagnosis not present

## 2019-08-03 DIAGNOSIS — G4733 Obstructive sleep apnea (adult) (pediatric): Secondary | ICD-10-CM | POA: Diagnosis not present

## 2019-08-22 DIAGNOSIS — M79671 Pain in right foot: Secondary | ICD-10-CM | POA: Diagnosis not present

## 2019-08-23 DIAGNOSIS — E782 Mixed hyperlipidemia: Secondary | ICD-10-CM | POA: Diagnosis not present

## 2019-08-23 DIAGNOSIS — K760 Fatty (change of) liver, not elsewhere classified: Secondary | ICD-10-CM | POA: Diagnosis not present

## 2019-08-23 DIAGNOSIS — R7301 Impaired fasting glucose: Secondary | ICD-10-CM | POA: Diagnosis not present

## 2019-08-23 DIAGNOSIS — I10 Essential (primary) hypertension: Secondary | ICD-10-CM | POA: Diagnosis not present

## 2019-09-11 DIAGNOSIS — K7581 Nonalcoholic steatohepatitis (NASH): Secondary | ICD-10-CM | POA: Diagnosis not present

## 2019-09-11 DIAGNOSIS — E782 Mixed hyperlipidemia: Secondary | ICD-10-CM | POA: Diagnosis not present

## 2019-09-11 DIAGNOSIS — R4582 Worries: Secondary | ICD-10-CM | POA: Diagnosis not present

## 2019-09-11 DIAGNOSIS — I25111 Atherosclerotic heart disease of native coronary artery with angina pectoris with documented spasm: Secondary | ICD-10-CM | POA: Diagnosis not present

## 2019-09-11 DIAGNOSIS — E039 Hypothyroidism, unspecified: Secondary | ICD-10-CM | POA: Diagnosis not present

## 2019-09-11 DIAGNOSIS — Z79891 Long term (current) use of opiate analgesic: Secondary | ICD-10-CM | POA: Diagnosis not present

## 2019-09-11 DIAGNOSIS — R7301 Impaired fasting glucose: Secondary | ICD-10-CM | POA: Diagnosis not present

## 2019-09-11 DIAGNOSIS — Z1389 Encounter for screening for other disorder: Secondary | ICD-10-CM | POA: Diagnosis not present

## 2019-09-11 DIAGNOSIS — Z91018 Allergy to other foods: Secondary | ICD-10-CM | POA: Diagnosis not present

## 2019-09-11 DIAGNOSIS — G4733 Obstructive sleep apnea (adult) (pediatric): Secondary | ICD-10-CM | POA: Diagnosis not present

## 2019-09-11 DIAGNOSIS — I10 Essential (primary) hypertension: Secondary | ICD-10-CM | POA: Diagnosis not present

## 2019-09-11 DIAGNOSIS — K760 Fatty (change of) liver, not elsewhere classified: Secondary | ICD-10-CM | POA: Diagnosis not present

## 2019-09-24 DIAGNOSIS — G4733 Obstructive sleep apnea (adult) (pediatric): Secondary | ICD-10-CM | POA: Diagnosis not present

## 2019-10-11 DIAGNOSIS — M47812 Spondylosis without myelopathy or radiculopathy, cervical region: Secondary | ICD-10-CM | POA: Diagnosis not present

## 2019-10-11 DIAGNOSIS — M9903 Segmental and somatic dysfunction of lumbar region: Secondary | ICD-10-CM | POA: Diagnosis not present

## 2019-10-11 DIAGNOSIS — M9902 Segmental and somatic dysfunction of thoracic region: Secondary | ICD-10-CM | POA: Diagnosis not present

## 2019-10-11 DIAGNOSIS — M9901 Segmental and somatic dysfunction of cervical region: Secondary | ICD-10-CM | POA: Diagnosis not present

## 2019-10-11 DIAGNOSIS — S233XXA Sprain of ligaments of thoracic spine, initial encounter: Secondary | ICD-10-CM | POA: Diagnosis not present

## 2019-10-21 DIAGNOSIS — S233XXA Sprain of ligaments of thoracic spine, initial encounter: Secondary | ICD-10-CM | POA: Diagnosis not present

## 2019-10-21 DIAGNOSIS — M9903 Segmental and somatic dysfunction of lumbar region: Secondary | ICD-10-CM | POA: Diagnosis not present

## 2019-10-21 DIAGNOSIS — M9902 Segmental and somatic dysfunction of thoracic region: Secondary | ICD-10-CM | POA: Diagnosis not present

## 2019-10-21 DIAGNOSIS — M47812 Spondylosis without myelopathy or radiculopathy, cervical region: Secondary | ICD-10-CM | POA: Diagnosis not present

## 2019-10-21 DIAGNOSIS — M9901 Segmental and somatic dysfunction of cervical region: Secondary | ICD-10-CM | POA: Diagnosis not present

## 2019-11-07 DIAGNOSIS — G4733 Obstructive sleep apnea (adult) (pediatric): Secondary | ICD-10-CM | POA: Diagnosis not present

## 2019-12-05 DIAGNOSIS — G4733 Obstructive sleep apnea (adult) (pediatric): Secondary | ICD-10-CM | POA: Diagnosis not present

## 2019-12-16 ENCOUNTER — Other Ambulatory Visit: Payer: Self-pay

## 2019-12-16 ENCOUNTER — Ambulatory Visit: Payer: Medicare Other | Admitting: Allergy

## 2019-12-16 ENCOUNTER — Other Ambulatory Visit: Payer: Self-pay | Admitting: Allergy

## 2019-12-16 ENCOUNTER — Encounter: Payer: Self-pay | Admitting: Allergy

## 2019-12-16 VITALS — BP 142/90 | HR 58 | Temp 98.1°F | Resp 18 | Ht 72.0 in | Wt 307.0 lb

## 2019-12-16 DIAGNOSIS — K760 Fatty (change of) liver, not elsewhere classified: Secondary | ICD-10-CM | POA: Diagnosis not present

## 2019-12-16 DIAGNOSIS — Z91018 Allergy to other foods: Secondary | ICD-10-CM

## 2019-12-16 DIAGNOSIS — R7301 Impaired fasting glucose: Secondary | ICD-10-CM | POA: Diagnosis not present

## 2019-12-16 DIAGNOSIS — R946 Abnormal results of thyroid function studies: Secondary | ICD-10-CM | POA: Diagnosis not present

## 2019-12-16 DIAGNOSIS — I10 Essential (primary) hypertension: Secondary | ICD-10-CM | POA: Diagnosis not present

## 2019-12-16 DIAGNOSIS — R945 Abnormal results of liver function studies: Secondary | ICD-10-CM | POA: Diagnosis not present

## 2019-12-16 DIAGNOSIS — T7840XD Allergy, unspecified, subsequent encounter: Secondary | ICD-10-CM | POA: Diagnosis not present

## 2019-12-16 DIAGNOSIS — E039 Hypothyroidism, unspecified: Secondary | ICD-10-CM | POA: Diagnosis not present

## 2019-12-16 MED ORDER — EPINEPHRINE 0.3 MG/0.3ML IJ SOAJ: 0.3000 mg | INTRAMUSCULAR | 2 refills | Status: DC | PRN

## 2019-12-16 NOTE — Patient Instructions (Addendum)
Alpha gal  Continue to avoid mammalian meat for now.  You can try the emu meat.   [email or web address]  For mild symptoms you can take over the counter antihistamines such as Benadryl and monitor symptoms closely. If symptoms worsen or if you have severe symptoms including breathing issues, throat closure, significant swelling, whole body hives, severe diarrhea and vomiting, lightheadedness then seek immediate medical care.  Emergency action plan in place.   If you have another episode let us know.   . Get bloodwork:  o We are ordering labs, so please allow 1-2 weeks for the results to come back. o With the newly implemented Cures Act, the labs might be visible to you at the same time that they become visible to me. However, I will not address the results until all of the results are back, so please be patient.  o In the meantime, continue recommendations in your patient instructions, including avoidance measures (if applicable), until you hear from me.  Follow up in 6 months or sooner if needed.  Follow up with PCP regarding your blood pressure.

## 2019-12-16 NOTE — Progress Notes (Signed)
Follow Up Note  RE: Joseph [name]. MRN: [id number] DOB: Aug 13, 1965 Date of Office Visit: 12/16/2019  Referring provider: Caryl Bis, MD Primary care provider: Caryl Bis, MD  Chief Complaint: Food Intolerance (AlphaGal)  History of Present Illness: I had the pleasure of seeing Joseph [name] for a follow up visit at the [facility] on 12/16/2019. He is a 54 y.o. male, who is being followed for allergic reaction and angioedema. His previous allergy office visit was on 06/17/2019 with Dr. Maudie Mercury. Today is a regular follow up visit.  Avoiding red meat and stopped lisinopril.  Wondering if he can eat deer, squirrel, and rabbit.   Eating fish, poultry with no issues.  No recent tick bites.  PCP checking his liver enzymes and adjusted BP meds.   Assessment and Plan: Joseph [name] is a 54 y.o. male with: Allergy to alpha-gal Past history - Patient had 2 episodes of reactions with pruritic feet, hands and lip angioedema within the past year. Symptoms resolved within a few hours after epinephrine, solumedrol, antihistamines and IVF. Denies changes in diet. Does take OTC tylenol and NSAIDs daily for pain control. No alcohol use. Up to date with colonoscopy.  Interim history - bloowork positive to alpha gal. No reactions since avoiding red meat and stopped lisinopril. PCP following LFTs.  Continue to avoid mammalian meat for now including rabbit and squirrel.  May try emu meat - [email or web address]  For mild symptoms you can take over the counter antihistamines such as Benadryl and monitor symptoms closely. If symptoms worsen or if you have severe symptoms including breathing issues, throat closure, significant swelling, whole body hives, severe diarrhea and vomiting, lightheadedness then inject epinephrine and seek immediate medical care afterwards.  Emergency action plan in place.   If you have another episode let us know.   Continue to avoid ACE-inhibitors.   . Get bloodwork for alpha gal and rabbit meat.   o If negative, will schedule for hamburger in office challenge.   Follow up with PCP regarding blood pressure.   Return in about 6 months (around 06/16/2020).  Meds ordered this encounter  Medications  . EPINEPHrine (EPIPEN 2-PAK) 0.3 mg/0.3 mL IJ SOAJ injection    Sig: Inject 0.3 mLs (0.3 mg total) into the muscle as needed for anaphylaxis.    Dispense:  1 each    Refill:  2    Lab Orders     Alpha-Gal Panel     Other/Misc lab test  Diagnostics: None.  Medication List:  Current Outpatient Medications  Medication Sig Dispense Refill  . Ascorbic Acid (VITAMIN C PO) Take 1 tablet by mouth daily.    Marland Kitchen aspirin 81 MG tablet Take 81 mg by mouth daily.    Marland Kitchen b complex vitamins capsule Take 1 capsule by mouth daily.    . busPIRone (BUSPAR) 15 MG tablet Take 15 mg by mouth daily.     Marland Kitchen EPINEPHrine (EPIPEN 2-PAK) 0.3 mg/0.3 mL IJ SOAJ injection Inject 0.3 mLs (0.3 mg total) into the muscle as needed for anaphylaxis. 1 each 2  . finasteride (PROSCAR) 5 MG tablet Take 5 mg by mouth at bedtime.     . hydrochlorothiazide (HYDRODIURIL) 12.5 MG tablet Take 12.5 mg by mouth daily.    . iron polysaccharides (NIFEREX) 150 MG capsule Take 1 capsule (150 mg total) by mouth 2 (two) times daily before lunch and supper. 60 capsule 1  . levothyroxine (SYNTHROID, LEVOTHROID) 125 MCG tablet Take 225 mcg  by mouth daily before breakfast.     . metoprolol succinate (TOPROL-XL) 25 MG 24 hr tablet Take 25 mg by mouth daily.    . Multiple Vitamins-Minerals (MULTIVITAMIN WITH MINERALS) tablet Take 1 tablet by mouth daily.    . naloxegol oxalate (MOVANTIK) 25 MG TABS tablet Take 25 mg by mouth daily as needed (constipation).     . nitroGLYCERIN (NITROSTAT) 0.4 MG SL tablet Place 0.4 mg under the tongue every 5 (five) minutes as needed for chest pain.    . Oxycodone HCl 20 MG TABS Take 20 tablets by mouth 5 (five) times daily.     . pravastatin (PRAVACHOL) 80 MG  tablet Take 80 mg by mouth daily.    . rivaroxaban (XARELTO) 10 MG TABS tablet Take 1 tablet (10 mg total) by mouth daily with breakfast. Take Xarelto for two and a half more weeks, then discontinue Xarelto. 19 tablet 0  . tamsulosin (FLOMAX) 0.4 MG CAPS capsule Take 0.4 mg by mouth at bedtime.      No current facility-administered medications for this visit.   Allergies: Allergies  Allergen Reactions  . Penicillins Anaphylaxis and Other (See Comments)    Has patient had a PCN reaction causing immediate rash, facial/tongue/throat swelling, SOB or lightheadedness with hypotension: unknown Has patient had a PCN reaction causing severe rash involving mucus membranes or skin necrosis: unknown Has patient had a PCN reaction that required hospitalization unknown Has patient had a PCN reaction occurring within the last 10 years: no If all of the above answers are "NO", then may proceed with Cephalosporin use.   . Tetanus Toxoid Other (See Comments)    REACTION: unknown   I reviewed his past medical history, social history, family history, and environmental history and no significant changes have been reported from his previous visit.  Review of Systems  Constitutional: Negative for appetite change, chills, fever and unexpected weight change.  HENT: Negative for congestion and rhinorrhea.   Eyes: Negative for itching.  Respiratory: Negative for cough, chest tightness, shortness of breath and wheezing.   Cardiovascular: Negative for chest pain.  Gastrointestinal: Negative for abdominal pain.  Genitourinary: Negative for difficulty urinating.  Skin: Negative for rash.  Allergic/Immunologic: Positive for food allergies. Negative for environmental allergies.  Neurological: Negative for headaches.   Objective: BP (!) 142/90 (BP Location: Left Arm, Patient Position: Sitting, Cuff Size: Large)   Pulse (!) 58   Temp 98.1 F (36.7 C) (Temporal)   Resp 18   Ht 6' (1.829 m)   Wt (!) 307 lb  (139.3 kg)   SpO2 94%   BMI 41.64 kg/m  Body mass index is 41.64 kg/m. Physical Exam Vitals and nursing note reviewed.  Constitutional:      Appearance: Normal appearance. He is well-developed.  HENT:     Head: Normocephalic and atraumatic.     Right Ear: Tympanic membrane and external ear normal.     Left Ear: Tympanic membrane and external ear normal.     Nose: Nose normal.     Mouth/Throat:     Mouth: Mucous membranes are moist.     Pharynx: Oropharynx is clear.  Eyes:     Conjunctiva/sclera: Conjunctivae normal.  Cardiovascular:     Rate and Rhythm: Normal rate and regular rhythm.     Heart sounds: Normal heart sounds. No murmur heard.  No friction rub. No gallop.   Pulmonary:     Effort: Pulmonary effort is normal.     Breath sounds: Normal breath sounds.  No wheezing or rales.  Musculoskeletal:     Cervical back: Neck supple.  Skin:    General: Skin is warm.     Findings: No rash.  Neurological:     Mental Status: He is alert and oriented to person, place, and time.  Psychiatric:        Mood and Affect: Mood normal.        Behavior: Behavior normal.    Previous notes and tests were reviewed. The plan was reviewed with the patient/family, and all questions/concerned were addressed.  It was my pleasure to see Joseph [name] today and participate in his care. Please feel free to contact me with any questions or concerns.  Sincerely,  Rexene Alberts, DO Allergy & Immunology  [facility] office: [phone] [facility] office: [place] office: [phone]

## 2019-12-16 NOTE — Assessment & Plan Note (Signed)
Past history - Patient had 2 episodes of reactions with pruritic feet, hands and lip angioedema within the past year. Symptoms resolved within a few hours after epinephrine, solumedrol, antihistamines and IVF. Denies changes in diet. Does take OTC tylenol and NSAIDs daily for pain control. No alcohol use. Up to date with colonoscopy.  Interim history - bloowork positive to alpha gal. No reactions since avoiding red meat and stopped lisinopril. PCP following LFTs.  Continue to avoid mammalian meat for now including rabbit and squirrel.  May try emu meat - [email or web address]  For mild symptoms you can take over the counter antihistamines such as Benadryl and monitor symptoms closely. If symptoms worsen or if you have severe symptoms including breathing issues, throat closure, significant swelling, whole body hives, severe diarrhea and vomiting, lightheadedness then inject epinephrine and seek immediate medical care afterwards.  Emergency action plan in place.   If you have another episode let us know.   Continue to avoid ACE-inhibitors.  . Get bloodwork for alpha gal and rabbit meat.   o If negative, will schedule for hamburger in office challenge.   Follow up with PCP regarding blood pressure.

## 2019-12-20 DIAGNOSIS — E039 Hypothyroidism, unspecified: Secondary | ICD-10-CM | POA: Diagnosis not present

## 2019-12-20 DIAGNOSIS — M19171 Post-traumatic osteoarthritis, right ankle and foot: Secondary | ICD-10-CM | POA: Diagnosis not present

## 2019-12-20 DIAGNOSIS — G4733 Obstructive sleep apnea (adult) (pediatric): Secondary | ICD-10-CM | POA: Diagnosis not present

## 2019-12-20 DIAGNOSIS — Z23 Encounter for immunization: Secondary | ICD-10-CM | POA: Diagnosis not present

## 2019-12-20 DIAGNOSIS — R4582 Worries: Secondary | ICD-10-CM | POA: Diagnosis not present

## 2019-12-20 DIAGNOSIS — I10 Essential (primary) hypertension: Secondary | ICD-10-CM | POA: Diagnosis not present

## 2019-12-20 DIAGNOSIS — M1652 Unilateral post-traumatic osteoarthritis, left hip: Secondary | ICD-10-CM | POA: Diagnosis not present

## 2019-12-20 DIAGNOSIS — Z79891 Long term (current) use of opiate analgesic: Secondary | ICD-10-CM | POA: Diagnosis not present

## 2019-12-20 DIAGNOSIS — I25111 Atherosclerotic heart disease of native coronary artery with angina pectoris with documented spasm: Secondary | ICD-10-CM | POA: Diagnosis not present

## 2019-12-20 DIAGNOSIS — E782 Mixed hyperlipidemia: Secondary | ICD-10-CM | POA: Diagnosis not present

## 2019-12-20 LAB — ALPHA-GAL PANEL
Alpha Gal IgE*: 53.3 kU/L — ABNORMAL HIGH (ref <0.10)
Beef (Bos spp) IgE: 18.1 kU/L — ABNORMAL HIGH (ref <0.35)
Class Interpretation: 2
Class Interpretation: 3
Class Interpretation: 4
Lamb/Mutton (Ovis spp) IgE: 3.75 kU/L — ABNORMAL HIGH (ref <0.35)
Pork (Sus spp) IgE: 2.05 kU/L — ABNORMAL HIGH (ref <0.35)

## 2019-12-21 LAB — F213-IGE RABBIT MEAT: F213-IgE Rabbit Meat: 2.12 kU/L — AB

## 2019-12-31 DIAGNOSIS — G4733 Obstructive sleep apnea (adult) (pediatric): Secondary | ICD-10-CM | POA: Diagnosis not present

## 2020-02-11 DIAGNOSIS — I10 Essential (primary) hypertension: Secondary | ICD-10-CM | POA: Diagnosis not present

## 2020-02-11 DIAGNOSIS — I2510 Atherosclerotic heart disease of native coronary artery without angina pectoris: Secondary | ICD-10-CM | POA: Diagnosis not present

## 2020-02-12 DIAGNOSIS — I2510 Atherosclerotic heart disease of native coronary artery without angina pectoris: Secondary | ICD-10-CM | POA: Diagnosis not present

## 2020-02-12 DIAGNOSIS — I10 Essential (primary) hypertension: Secondary | ICD-10-CM | POA: Diagnosis not present

## 2020-03-02 DIAGNOSIS — G4733 Obstructive sleep apnea (adult) (pediatric): Secondary | ICD-10-CM | POA: Diagnosis not present

## 2020-03-03 DIAGNOSIS — Z87891 Personal history of nicotine dependence: Secondary | ICD-10-CM | POA: Diagnosis not present

## 2020-03-03 DIAGNOSIS — R0789 Other chest pain: Secondary | ICD-10-CM | POA: Diagnosis not present

## 2020-03-03 DIAGNOSIS — I2510 Atherosclerotic heart disease of native coronary artery without angina pectoris: Secondary | ICD-10-CM | POA: Diagnosis not present

## 2020-03-03 DIAGNOSIS — I10 Essential (primary) hypertension: Secondary | ICD-10-CM | POA: Diagnosis not present

## 2020-03-03 DIAGNOSIS — R079 Chest pain, unspecified: Secondary | ICD-10-CM | POA: Diagnosis not present

## 2020-03-12 DIAGNOSIS — M9904 Segmental and somatic dysfunction of sacral region: Secondary | ICD-10-CM | POA: Diagnosis not present

## 2020-03-12 DIAGNOSIS — M5442 Lumbago with sciatica, left side: Secondary | ICD-10-CM | POA: Diagnosis not present

## 2020-03-12 DIAGNOSIS — M9903 Segmental and somatic dysfunction of lumbar region: Secondary | ICD-10-CM | POA: Diagnosis not present

## 2020-03-12 DIAGNOSIS — M9905 Segmental and somatic dysfunction of pelvic region: Secondary | ICD-10-CM | POA: Diagnosis not present

## 2020-03-19 DIAGNOSIS — E039 Hypothyroidism, unspecified: Secondary | ICD-10-CM | POA: Diagnosis not present

## 2020-03-19 DIAGNOSIS — I10 Essential (primary) hypertension: Secondary | ICD-10-CM | POA: Diagnosis not present

## 2020-03-19 DIAGNOSIS — E782 Mixed hyperlipidemia: Secondary | ICD-10-CM | POA: Diagnosis not present

## 2020-03-19 DIAGNOSIS — R945 Abnormal results of liver function studies: Secondary | ICD-10-CM | POA: Diagnosis not present

## 2020-03-19 DIAGNOSIS — K760 Fatty (change of) liver, not elsewhere classified: Secondary | ICD-10-CM | POA: Diagnosis not present

## 2020-03-19 DIAGNOSIS — R7301 Impaired fasting glucose: Secondary | ICD-10-CM | POA: Diagnosis not present

## 2020-03-24 DIAGNOSIS — I25111 Atherosclerotic heart disease of native coronary artery with angina pectoris with documented spasm: Secondary | ICD-10-CM | POA: Diagnosis not present

## 2020-03-24 DIAGNOSIS — I10 Essential (primary) hypertension: Secondary | ICD-10-CM | POA: Diagnosis not present

## 2020-03-24 DIAGNOSIS — R4582 Worries: Secondary | ICD-10-CM | POA: Diagnosis not present

## 2020-03-24 DIAGNOSIS — K760 Fatty (change of) liver, not elsewhere classified: Secondary | ICD-10-CM | POA: Diagnosis not present

## 2020-03-24 DIAGNOSIS — M19171 Post-traumatic osteoarthritis, right ankle and foot: Secondary | ICD-10-CM | POA: Diagnosis not present

## 2020-03-24 DIAGNOSIS — E782 Mixed hyperlipidemia: Secondary | ICD-10-CM | POA: Diagnosis not present

## 2020-03-24 DIAGNOSIS — Z23 Encounter for immunization: Secondary | ICD-10-CM | POA: Diagnosis not present

## 2020-04-01 DIAGNOSIS — G4733 Obstructive sleep apnea (adult) (pediatric): Secondary | ICD-10-CM | POA: Diagnosis not present

## 2020-05-18 ENCOUNTER — Encounter: Payer: Self-pay | Admitting: Physician Assistant

## 2020-05-18 DIAGNOSIS — Z1211 Encounter for screening for malignant neoplasm of colon: Secondary | ICD-10-CM | POA: Diagnosis not present

## 2020-05-18 DIAGNOSIS — G4730 Sleep apnea, unspecified: Secondary | ICD-10-CM | POA: Diagnosis not present

## 2020-05-18 DIAGNOSIS — K429 Umbilical hernia without obstruction or gangrene: Secondary | ICD-10-CM | POA: Diagnosis not present

## 2020-05-18 DIAGNOSIS — I2510 Atherosclerotic heart disease of native coronary artery without angina pectoris: Secondary | ICD-10-CM | POA: Diagnosis not present

## 2020-05-19 DIAGNOSIS — I10 Essential (primary) hypertension: Secondary | ICD-10-CM | POA: Diagnosis not present

## 2020-05-19 DIAGNOSIS — E039 Hypothyroidism, unspecified: Secondary | ICD-10-CM | POA: Diagnosis not present

## 2020-05-19 DIAGNOSIS — I2510 Atherosclerotic heart disease of native coronary artery without angina pectoris: Secondary | ICD-10-CM | POA: Diagnosis not present

## 2020-05-19 DIAGNOSIS — E7849 Other hyperlipidemia: Secondary | ICD-10-CM | POA: Diagnosis not present

## 2020-05-27 DIAGNOSIS — I25111 Atherosclerotic heart disease of native coronary artery with angina pectoris with documented spasm: Secondary | ICD-10-CM | POA: Diagnosis not present

## 2020-05-27 DIAGNOSIS — R4582 Worries: Secondary | ICD-10-CM | POA: Diagnosis not present

## 2020-05-27 DIAGNOSIS — E782 Mixed hyperlipidemia: Secondary | ICD-10-CM | POA: Diagnosis not present

## 2020-05-27 DIAGNOSIS — R7301 Impaired fasting glucose: Secondary | ICD-10-CM | POA: Diagnosis not present

## 2020-05-27 DIAGNOSIS — Z79891 Long term (current) use of opiate analgesic: Secondary | ICD-10-CM | POA: Diagnosis not present

## 2020-05-27 DIAGNOSIS — E039 Hypothyroidism, unspecified: Secondary | ICD-10-CM | POA: Diagnosis not present

## 2020-05-27 DIAGNOSIS — Z0001 Encounter for general adult medical examination with abnormal findings: Secondary | ICD-10-CM | POA: Diagnosis not present

## 2020-05-27 DIAGNOSIS — I10 Essential (primary) hypertension: Secondary | ICD-10-CM | POA: Diagnosis not present

## 2020-05-27 DIAGNOSIS — G4733 Obstructive sleep apnea (adult) (pediatric): Secondary | ICD-10-CM | POA: Diagnosis not present

## 2020-06-02 ENCOUNTER — Ambulatory Visit: Payer: Medicare Other | Admitting: Physician Assistant

## 2020-06-02 DIAGNOSIS — M47816 Spondylosis without myelopathy or radiculopathy, lumbar region: Secondary | ICD-10-CM | POA: Diagnosis not present

## 2020-06-11 DIAGNOSIS — G4733 Obstructive sleep apnea (adult) (pediatric): Secondary | ICD-10-CM | POA: Diagnosis not present

## 2020-06-17 DIAGNOSIS — K439 Ventral hernia without obstruction or gangrene: Secondary | ICD-10-CM | POA: Diagnosis not present

## 2020-06-19 DIAGNOSIS — E039 Hypothyroidism, unspecified: Secondary | ICD-10-CM | POA: Diagnosis not present

## 2020-06-19 DIAGNOSIS — I2510 Atherosclerotic heart disease of native coronary artery without angina pectoris: Secondary | ICD-10-CM | POA: Diagnosis not present

## 2020-06-19 DIAGNOSIS — E7849 Other hyperlipidemia: Secondary | ICD-10-CM | POA: Diagnosis not present

## 2020-06-19 DIAGNOSIS — I10 Essential (primary) hypertension: Secondary | ICD-10-CM | POA: Diagnosis not present

## 2020-06-21 NOTE — Progress Notes (Deleted)
Follow Up Note  RE: Joseph BarterJames C Wageman Jr. MRN: [id number] DOB: 05/07/1966 Date of Office Visit: 06/22/2020  Referring provider: Richardean Chimeraaniel, Terry G, MD Primary care provider: Richardean Chimeraaniel, Terry G, MD  Chief Complaint: No chief complaint on file.  History of Present Illness: I had the pleasure of seeing Joseph [name] for a follow up visit at the [facility] on 06/21/2020. He is a 55 y.o. male, who is being followed for alpha gal allergy. His previous allergy office visit was on 12/16/2019 with Dr. Selena BattenKim. Today is a regular follow up visit.  Allergy to alpha-gal Past history - Patient had 2 episodes of reactions with pruritic feet, hands and lip angioedema within the past year. Symptoms resolved within a few hours after epinephrine, solumedrol, antihistamines and IVF. Denies changes in diet. Does take OTC tylenol and NSAIDs daily for pain control. No alcohol use. Up to date with colonoscopy.  Interim history - bloowork positive to alpha gal. No reactions since avoiding red meat and stopped lisinopril. PCP following LFTs.  Continue to avoid mammalian meat for now including rabbit and squirrel.  May try emu meat - [email or web address]  For mild symptoms you can take over the counter antihistamines such as Benadryl and monitor symptoms closely. If symptoms worsen or if you have severe symptoms including breathing issues, throat closure, significant swelling, whole body hives, severe diarrhea and vomiting, lightheadedness then inject epinephrine and seek immediate medical care afterwards.  Emergency action plan in place.   If you have another episode let us know.   Continue to avoid ACE-inhibitors.   Get bloodwork for alpha gal and rabbit meat.   ? If negative, will schedule for hamburger in office challenge.   Follow up with PCP regarding blood pressure.   Return in about 6 months (around 06/16/2020). Component     Latest Ref Rng & Units 06/17/2019 12/16/2019  Beef (Bos spp)  IgE     <0.35 kU/L 5.18 (H) 18.10 (H)  Class Interpretation      3 4  Lamb/Mutton (Ovis spp) IgE     <0.35 kU/L 0.91 (H) 3.75 (H)  Class Interpretation      2 3  Pork (Sus spp) IgE     <0.35 kU/L 0.95 (H) 2.05 (H)  Class Interpretation      2 2  Alpha Gal IgE*     <0.10 kU/L 15.90 (H) 53.30 (H)   Component     Latest Ref Rng & Units 12/16/2019  F213-IgE Rabbit Meat     Class III kU/L 2.12 (A)    Assessment and Plan: Joseph [name] is a 55 y.o. male with: No problem-specific Assessment & Plan notes found for this encounter.  No follow-ups on file.  No orders of the defined types were placed in this encounter.  Lab Orders  No laboratory test(s) ordered today    Diagnostics: Spirometry:  Tracings reviewed. His effort: {Blank single:19197::"Good reproducible efforts.","It was hard to get consistent efforts and there is a question as to whether this reflects a maximal maneuver.","Poor effort, data can not be interpreted."} FVC: ***L FEV1: ***L, ***% predicted FEV1/FVC ratio: ***% Interpretation: {Blank single:19197::"Spirometry consistent with mild obstructive disease","Spirometry consistent with moderate obstructive disease","Spirometry consistent with severe obstructive disease","Spirometry consistent with possible restrictive disease","Spirometry consistent with mixed obstructive and restrictive disease","Spirometry uninterpretable due to technique","Spirometry consistent with normal pattern","No overt abnormalities noted given today's efforts"}.  Please see scanned spirometry results for details.  Skin Testing: {Blank single:19197::"Select foods","Environmental allergy panel","Environmental allergy panel and  select foods","Food allergy panel","None","Deferred due to recent antihistamines use"}. Positive test to: ***. Negative test to: ***.  Results discussed with patient/family.   Medication List:  Current Outpatient Medications  Medication Sig Dispense Refill  . Ascorbic Acid  (VITAMIN C PO) Take 1 tablet by mouth daily.    Marland Kitchen aspirin 81 MG tablet Take 81 mg by mouth daily.    Marland Kitchen b complex vitamins capsule Take 1 capsule by mouth daily.    . busPIRone (BUSPAR) 15 MG tablet Take 15 mg by mouth daily.     Marland Kitchen EPINEPHrine (EPIPEN 2-PAK) 0.3 mg/0.3 mL IJ SOAJ injection Inject 0.3 mLs (0.3 mg total) into the muscle as needed for anaphylaxis. 1 each 2  . finasteride (PROSCAR) 5 MG tablet Take 5 mg by mouth at bedtime.     . hydrochlorothiazide (HYDRODIURIL) 12.5 MG tablet Take 12.5 mg by mouth daily.    . iron polysaccharides (NIFEREX) 150 MG capsule Take 1 capsule (150 mg total) by mouth 2 (two) times daily before lunch and supper. 60 capsule 1  . levothyroxine (SYNTHROID, LEVOTHROID) 125 MCG tablet Take 225 mcg by mouth daily before breakfast.     . metoprolol succinate (TOPROL-XL) 25 MG 24 hr tablet Take 25 mg by mouth daily.    . Multiple Vitamins-Minerals (MULTIVITAMIN WITH MINERALS) tablet Take 1 tablet by mouth daily.    . naloxegol oxalate (MOVANTIK) 25 MG TABS tablet Take 25 mg by mouth daily as needed (constipation).     . nitroGLYCERIN (NITROSTAT) 0.4 MG SL tablet Place 0.4 mg under the tongue every 5 (five) minutes as needed for chest pain.    . Oxycodone HCl 20 MG TABS Take 20 tablets by mouth 5 (five) times daily.     . pravastatin (PRAVACHOL) 80 MG tablet Take 80 mg by mouth daily.    . rivaroxaban (XARELTO) 10 MG TABS tablet Take 1 tablet (10 mg total) by mouth daily with breakfast. Take Xarelto for two and a half more weeks, then discontinue Xarelto. 19 tablet 0  . tamsulosin (FLOMAX) 0.4 MG CAPS capsule Take 0.4 mg by mouth at bedtime.      No current facility-administered medications for this visit.   Allergies: Allergies  Allergen Reactions  . Penicillins Anaphylaxis and Other (See Comments)    Has patient had a PCN reaction causing immediate rash, facial/tongue/throat swelling, SOB or lightheadedness with hypotension: unknown Has patient had a PCN  reaction causing severe rash involving mucus membranes or skin necrosis: unknown Has patient had a PCN reaction that required hospitalization unknown Has patient had a PCN reaction occurring within the last 10 years: no If all of the above answers are "NO", then may proceed with Cephalosporin use.   . Tetanus Toxoid Other (See Comments)    REACTION: unknown   I reviewed his past medical history, social history, family history, and environmental history and no significant changes have been reported from his previous visit.  Review of Systems  Constitutional: Negative for appetite change, chills, fever and unexpected weight change.  HENT: Negative for congestion and rhinorrhea.   Eyes: Negative for itching.  Respiratory: Negative for cough, chest tightness, shortness of breath and wheezing.   Cardiovascular: Negative for chest pain.  Gastrointestinal: Negative for abdominal pain.  Genitourinary: Negative for difficulty urinating.  Skin: Negative for rash.  Allergic/Immunologic: Positive for food allergies. Negative for environmental allergies.  Neurological: Negative for headaches.   Objective: There were no vitals taken for this visit. There is no height or weight  on file to calculate BMI. Physical Exam Vitals and nursing note reviewed.  Constitutional:      Appearance: Normal appearance. He is well-developed.  HENT:     Head: Normocephalic and atraumatic.     Right Ear: Tympanic membrane and external ear normal.     Left Ear: Tympanic membrane and external ear normal.     Nose: Nose normal.     Mouth/Throat:     Mouth: Mucous membranes are moist.     Pharynx: Oropharynx is clear.  Eyes:     Conjunctiva/sclera: Conjunctivae normal.  Cardiovascular:     Rate and Rhythm: Normal rate and regular rhythm.     Heart sounds: Normal heart sounds. No murmur heard. No friction rub. No gallop.   Pulmonary:     Effort: Pulmonary effort is normal.     Breath sounds: Normal breath  sounds. No wheezing or rales.  Musculoskeletal:     Cervical back: Neck supple.  Skin:    General: Skin is warm.     Findings: No rash.  Neurological:     Mental Status: He is alert and oriented to person, place, and time.  Psychiatric:        Mood and Affect: Mood normal.        Behavior: Behavior normal.    Previous notes and tests were reviewed. The plan was reviewed with the patient/family, and all questions/concerned were addressed.  It was my pleasure to see Carvin today and participate in his care. Please feel free to contact me with any questions or concerns.  Sincerely,  Wyline Mood, DO Allergy & Immunology  [facility] office: [phone] [facility] office: [phone]

## 2020-06-22 ENCOUNTER — Telehealth: Payer: Self-pay

## 2020-06-22 ENCOUNTER — Ambulatory Visit: Payer: Medicare Other | Admitting: Allergy

## 2020-06-22 DIAGNOSIS — Z91018 Allergy to other foods: Secondary | ICD-10-CM

## 2020-06-22 NOTE — Telephone Encounter (Signed)
Called to see if patient wanted to reschedule his missed appointment and patient expressed that he was snowed in. I told patient that I understood and asked if he's like to schedule for another time and patient said that he would for next year. I informed patient that would be up to the physician but he would have to discuss it at his next appointment. I also expressed that if he's needed medications or refills that our providers prefer to see in office to check on progress and patient said that he'd cancel everything due to him not wanting to come in every 6 months and patient disconnected the call.

## 2020-07-02 DIAGNOSIS — G4733 Obstructive sleep apnea (adult) (pediatric): Secondary | ICD-10-CM | POA: Diagnosis not present

## 2020-07-09 DIAGNOSIS — E039 Hypothyroidism, unspecified: Secondary | ICD-10-CM | POA: Diagnosis not present

## 2020-07-20 DIAGNOSIS — I10 Essential (primary) hypertension: Secondary | ICD-10-CM | POA: Diagnosis not present

## 2020-07-20 DIAGNOSIS — E7849 Other hyperlipidemia: Secondary | ICD-10-CM | POA: Diagnosis not present

## 2020-07-20 DIAGNOSIS — E039 Hypothyroidism, unspecified: Secondary | ICD-10-CM | POA: Diagnosis not present

## 2020-07-20 DIAGNOSIS — I2510 Atherosclerotic heart disease of native coronary artery without angina pectoris: Secondary | ICD-10-CM | POA: Diagnosis not present

## 2020-08-07 DIAGNOSIS — M79676 Pain in unspecified toe(s): Secondary | ICD-10-CM | POA: Diagnosis not present

## 2020-08-07 DIAGNOSIS — R059 Cough, unspecified: Secondary | ICD-10-CM | POA: Diagnosis not present

## 2020-08-20 DIAGNOSIS — I2510 Atherosclerotic heart disease of native coronary artery without angina pectoris: Secondary | ICD-10-CM | POA: Diagnosis not present

## 2020-08-20 DIAGNOSIS — R0600 Dyspnea, unspecified: Secondary | ICD-10-CM | POA: Diagnosis not present

## 2020-08-21 DIAGNOSIS — G4733 Obstructive sleep apnea (adult) (pediatric): Secondary | ICD-10-CM | POA: Diagnosis not present

## 2020-08-24 DIAGNOSIS — R4582 Worries: Secondary | ICD-10-CM | POA: Diagnosis not present

## 2020-08-24 DIAGNOSIS — J019 Acute sinusitis, unspecified: Secondary | ICD-10-CM | POA: Diagnosis not present

## 2020-08-24 DIAGNOSIS — I10 Essential (primary) hypertension: Secondary | ICD-10-CM | POA: Diagnosis not present

## 2020-08-24 DIAGNOSIS — E7849 Other hyperlipidemia: Secondary | ICD-10-CM | POA: Diagnosis not present

## 2020-08-24 DIAGNOSIS — I25111 Atherosclerotic heart disease of native coronary artery with angina pectoris with documented spasm: Secondary | ICD-10-CM | POA: Diagnosis not present

## 2020-08-24 DIAGNOSIS — E039 Hypothyroidism, unspecified: Secondary | ICD-10-CM | POA: Diagnosis not present

## 2020-08-25 DIAGNOSIS — E7849 Other hyperlipidemia: Secondary | ICD-10-CM | POA: Diagnosis not present

## 2020-08-25 DIAGNOSIS — E782 Mixed hyperlipidemia: Secondary | ICD-10-CM | POA: Diagnosis not present

## 2020-08-25 DIAGNOSIS — R7301 Impaired fasting glucose: Secondary | ICD-10-CM | POA: Diagnosis not present

## 2020-08-25 DIAGNOSIS — E039 Hypothyroidism, unspecified: Secondary | ICD-10-CM | POA: Diagnosis not present

## 2020-08-25 DIAGNOSIS — G4733 Obstructive sleep apnea (adult) (pediatric): Secondary | ICD-10-CM | POA: Diagnosis not present

## 2020-08-30 DIAGNOSIS — G4733 Obstructive sleep apnea (adult) (pediatric): Secondary | ICD-10-CM | POA: Diagnosis not present

## 2020-09-21 DIAGNOSIS — G4733 Obstructive sleep apnea (adult) (pediatric): Secondary | ICD-10-CM | POA: Diagnosis not present

## 2020-09-30 DIAGNOSIS — G4733 Obstructive sleep apnea (adult) (pediatric): Secondary | ICD-10-CM | POA: Diagnosis not present

## 2020-10-17 DIAGNOSIS — E039 Hypothyroidism, unspecified: Secondary | ICD-10-CM | POA: Diagnosis not present

## 2020-10-17 DIAGNOSIS — E7849 Other hyperlipidemia: Secondary | ICD-10-CM | POA: Diagnosis not present

## 2020-10-17 DIAGNOSIS — I2510 Atherosclerotic heart disease of native coronary artery without angina pectoris: Secondary | ICD-10-CM | POA: Diagnosis not present

## 2020-10-17 DIAGNOSIS — I10 Essential (primary) hypertension: Secondary | ICD-10-CM | POA: Diagnosis not present

## 2020-10-21 DIAGNOSIS — G4733 Obstructive sleep apnea (adult) (pediatric): Secondary | ICD-10-CM | POA: Diagnosis not present

## 2020-11-21 DIAGNOSIS — G4733 Obstructive sleep apnea (adult) (pediatric): Secondary | ICD-10-CM | POA: Diagnosis not present

## 2020-12-02 DIAGNOSIS — L259 Unspecified contact dermatitis, unspecified cause: Secondary | ICD-10-CM | POA: Diagnosis not present

## 2020-12-05 DIAGNOSIS — E782 Mixed hyperlipidemia: Secondary | ICD-10-CM | POA: Diagnosis not present

## 2020-12-05 DIAGNOSIS — E1122 Type 2 diabetes mellitus with diabetic chronic kidney disease: Secondary | ICD-10-CM | POA: Diagnosis not present

## 2020-12-05 DIAGNOSIS — E7849 Other hyperlipidemia: Secondary | ICD-10-CM | POA: Diagnosis not present

## 2020-12-05 DIAGNOSIS — E039 Hypothyroidism, unspecified: Secondary | ICD-10-CM | POA: Diagnosis not present

## 2020-12-05 DIAGNOSIS — E1165 Type 2 diabetes mellitus with hyperglycemia: Secondary | ICD-10-CM | POA: Diagnosis not present

## 2020-12-05 DIAGNOSIS — N183 Chronic kidney disease, stage 3 unspecified: Secondary | ICD-10-CM | POA: Diagnosis not present

## 2020-12-08 DIAGNOSIS — Z91018 Allergy to other foods: Secondary | ICD-10-CM | POA: Diagnosis not present

## 2020-12-08 DIAGNOSIS — R4582 Worries: Secondary | ICD-10-CM | POA: Diagnosis not present

## 2020-12-08 DIAGNOSIS — E7849 Other hyperlipidemia: Secondary | ICD-10-CM | POA: Diagnosis not present

## 2020-12-08 DIAGNOSIS — M19171 Post-traumatic osteoarthritis, right ankle and foot: Secondary | ICD-10-CM | POA: Diagnosis not present

## 2020-12-08 DIAGNOSIS — I10 Essential (primary) hypertension: Secondary | ICD-10-CM | POA: Diagnosis not present

## 2020-12-08 DIAGNOSIS — I25111 Atherosclerotic heart disease of native coronary artery with angina pectoris with documented spasm: Secondary | ICD-10-CM | POA: Diagnosis not present

## 2020-12-08 DIAGNOSIS — E039 Hypothyroidism, unspecified: Secondary | ICD-10-CM | POA: Diagnosis not present

## 2020-12-17 DIAGNOSIS — I2510 Atherosclerotic heart disease of native coronary artery without angina pectoris: Secondary | ICD-10-CM | POA: Diagnosis not present

## 2020-12-17 DIAGNOSIS — I10 Essential (primary) hypertension: Secondary | ICD-10-CM | POA: Diagnosis not present

## 2020-12-17 DIAGNOSIS — E039 Hypothyroidism, unspecified: Secondary | ICD-10-CM | POA: Diagnosis not present

## 2020-12-17 DIAGNOSIS — E7849 Other hyperlipidemia: Secondary | ICD-10-CM | POA: Diagnosis not present

## 2020-12-21 DIAGNOSIS — G4733 Obstructive sleep apnea (adult) (pediatric): Secondary | ICD-10-CM | POA: Diagnosis not present

## 2020-12-25 DIAGNOSIS — R7989 Other specified abnormal findings of blood chemistry: Secondary | ICD-10-CM | POA: Diagnosis not present

## 2020-12-29 DIAGNOSIS — I2510 Atherosclerotic heart disease of native coronary artery without angina pectoris: Secondary | ICD-10-CM | POA: Diagnosis not present

## 2020-12-29 DIAGNOSIS — R0600 Dyspnea, unspecified: Secondary | ICD-10-CM | POA: Diagnosis not present

## 2020-12-30 DIAGNOSIS — G4733 Obstructive sleep apnea (adult) (pediatric): Secondary | ICD-10-CM | POA: Diagnosis not present

## 2020-12-31 DIAGNOSIS — Z0100 Encounter for examination of eyes and vision without abnormal findings: Secondary | ICD-10-CM | POA: Diagnosis not present

## 2020-12-31 DIAGNOSIS — I517 Cardiomegaly: Secondary | ICD-10-CM | POA: Diagnosis not present

## 2020-12-31 DIAGNOSIS — I25119 Atherosclerotic heart disease of native coronary artery with unspecified angina pectoris: Secondary | ICD-10-CM | POA: Diagnosis not present

## 2020-12-31 DIAGNOSIS — R079 Chest pain, unspecified: Secondary | ICD-10-CM | POA: Diagnosis not present

## 2020-12-31 DIAGNOSIS — I340 Nonrheumatic mitral (valve) insufficiency: Secondary | ICD-10-CM | POA: Diagnosis not present

## 2021-01-01 DIAGNOSIS — M545 Low back pain, unspecified: Secondary | ICD-10-CM | POA: Diagnosis not present

## 2021-01-01 DIAGNOSIS — N200 Calculus of kidney: Secondary | ICD-10-CM | POA: Diagnosis not present

## 2021-01-21 DIAGNOSIS — G4733 Obstructive sleep apnea (adult) (pediatric): Secondary | ICD-10-CM | POA: Diagnosis not present

## 2021-02-05 DIAGNOSIS — G4733 Obstructive sleep apnea (adult) (pediatric): Secondary | ICD-10-CM | POA: Diagnosis not present

## 2021-02-21 DIAGNOSIS — G4733 Obstructive sleep apnea (adult) (pediatric): Secondary | ICD-10-CM | POA: Diagnosis not present

## 2021-03-02 DIAGNOSIS — E039 Hypothyroidism, unspecified: Secondary | ICD-10-CM | POA: Diagnosis not present

## 2021-03-02 DIAGNOSIS — I10 Essential (primary) hypertension: Secondary | ICD-10-CM | POA: Diagnosis not present

## 2021-03-02 DIAGNOSIS — R5383 Other fatigue: Secondary | ICD-10-CM | POA: Diagnosis not present

## 2021-03-02 DIAGNOSIS — N183 Chronic kidney disease, stage 3 unspecified: Secondary | ICD-10-CM | POA: Diagnosis not present

## 2021-03-02 DIAGNOSIS — E7849 Other hyperlipidemia: Secondary | ICD-10-CM | POA: Diagnosis not present

## 2021-03-02 DIAGNOSIS — G4733 Obstructive sleep apnea (adult) (pediatric): Secondary | ICD-10-CM | POA: Diagnosis not present

## 2021-03-02 DIAGNOSIS — E782 Mixed hyperlipidemia: Secondary | ICD-10-CM | POA: Diagnosis not present

## 2021-03-02 DIAGNOSIS — E1122 Type 2 diabetes mellitus with diabetic chronic kidney disease: Secondary | ICD-10-CM | POA: Diagnosis not present

## 2021-03-05 DIAGNOSIS — E7849 Other hyperlipidemia: Secondary | ICD-10-CM | POA: Diagnosis not present

## 2021-03-05 DIAGNOSIS — I10 Essential (primary) hypertension: Secondary | ICD-10-CM | POA: Diagnosis not present

## 2021-03-05 DIAGNOSIS — E039 Hypothyroidism, unspecified: Secondary | ICD-10-CM | POA: Diagnosis not present

## 2021-03-05 DIAGNOSIS — K7581 Nonalcoholic steatohepatitis (NASH): Secondary | ICD-10-CM | POA: Diagnosis not present

## 2021-03-05 DIAGNOSIS — I25111 Atherosclerotic heart disease of native coronary artery with angina pectoris with documented spasm: Secondary | ICD-10-CM | POA: Diagnosis not present

## 2021-03-05 DIAGNOSIS — Z23 Encounter for immunization: Secondary | ICD-10-CM | POA: Diagnosis not present

## 2021-03-05 DIAGNOSIS — R4582 Worries: Secondary | ICD-10-CM | POA: Diagnosis not present

## 2021-03-23 DIAGNOSIS — G4733 Obstructive sleep apnea (adult) (pediatric): Secondary | ICD-10-CM | POA: Diagnosis not present

## 2021-03-29 DIAGNOSIS — J029 Acute pharyngitis, unspecified: Secondary | ICD-10-CM | POA: Diagnosis not present

## 2021-04-23 DIAGNOSIS — G4733 Obstructive sleep apnea (adult) (pediatric): Secondary | ICD-10-CM | POA: Diagnosis not present

## 2021-05-02 DIAGNOSIS — G4733 Obstructive sleep apnea (adult) (pediatric): Secondary | ICD-10-CM | POA: Diagnosis not present

## 2021-05-23 DIAGNOSIS — G4733 Obstructive sleep apnea (adult) (pediatric): Secondary | ICD-10-CM | POA: Diagnosis not present

## 2021-06-01 DIAGNOSIS — G4733 Obstructive sleep apnea (adult) (pediatric): Secondary | ICD-10-CM | POA: Diagnosis not present

## 2021-06-28 DIAGNOSIS — U071 COVID-19: Secondary | ICD-10-CM | POA: Diagnosis not present

## 2021-07-02 DIAGNOSIS — G4733 Obstructive sleep apnea (adult) (pediatric): Secondary | ICD-10-CM | POA: Diagnosis not present

## 2021-07-06 DIAGNOSIS — E039 Hypothyroidism, unspecified: Secondary | ICD-10-CM | POA: Diagnosis not present

## 2021-07-06 DIAGNOSIS — Z1159 Encounter for screening for other viral diseases: Secondary | ICD-10-CM | POA: Diagnosis not present

## 2021-07-06 DIAGNOSIS — K760 Fatty (change of) liver, not elsewhere classified: Secondary | ICD-10-CM | POA: Diagnosis not present

## 2021-07-06 DIAGNOSIS — R7989 Other specified abnormal findings of blood chemistry: Secondary | ICD-10-CM | POA: Diagnosis not present

## 2021-07-06 DIAGNOSIS — R7301 Impaired fasting glucose: Secondary | ICD-10-CM | POA: Diagnosis not present

## 2021-07-06 DIAGNOSIS — E1122 Type 2 diabetes mellitus with diabetic chronic kidney disease: Secondary | ICD-10-CM | POA: Diagnosis not present

## 2021-07-06 DIAGNOSIS — I10 Essential (primary) hypertension: Secondary | ICD-10-CM | POA: Diagnosis not present

## 2021-07-06 DIAGNOSIS — N183 Chronic kidney disease, stage 3 unspecified: Secondary | ICD-10-CM | POA: Diagnosis not present

## 2021-07-06 DIAGNOSIS — Z91018 Allergy to other foods: Secondary | ICD-10-CM | POA: Diagnosis not present

## 2021-07-06 DIAGNOSIS — E1165 Type 2 diabetes mellitus with hyperglycemia: Secondary | ICD-10-CM | POA: Diagnosis not present

## 2021-07-09 DIAGNOSIS — Z91018 Allergy to other foods: Secondary | ICD-10-CM | POA: Diagnosis not present

## 2021-07-09 DIAGNOSIS — K529 Noninfective gastroenteritis and colitis, unspecified: Secondary | ICD-10-CM | POA: Diagnosis not present

## 2021-07-09 DIAGNOSIS — K760 Fatty (change of) liver, not elsewhere classified: Secondary | ICD-10-CM | POA: Diagnosis not present

## 2021-07-09 DIAGNOSIS — E7849 Other hyperlipidemia: Secondary | ICD-10-CM | POA: Diagnosis not present

## 2021-07-09 DIAGNOSIS — I25111 Atherosclerotic heart disease of native coronary artery with angina pectoris with documented spasm: Secondary | ICD-10-CM | POA: Diagnosis not present

## 2021-07-09 DIAGNOSIS — K7581 Nonalcoholic steatohepatitis (NASH): Secondary | ICD-10-CM | POA: Diagnosis not present

## 2021-07-09 DIAGNOSIS — Z0001 Encounter for general adult medical examination with abnormal findings: Secondary | ICD-10-CM | POA: Diagnosis not present

## 2021-07-09 DIAGNOSIS — I10 Essential (primary) hypertension: Secondary | ICD-10-CM | POA: Diagnosis not present

## 2021-07-10 ENCOUNTER — Other Ambulatory Visit (HOSPITAL_COMMUNITY)
Admission: RE | Admit: 2021-07-10 | Discharge: 2021-07-10 | Disposition: A | Discharge status: Home / Self Care | Payer: Medicare Other | Source: Other Acute Inpatient Hospital | Attending: Family Medicine | Admitting: Family Medicine

## 2021-07-10 DIAGNOSIS — R109 Unspecified abdominal pain: Secondary | ICD-10-CM | POA: Insufficient documentation

## 2021-07-10 DIAGNOSIS — R197 Diarrhea, unspecified: Secondary | ICD-10-CM | POA: Insufficient documentation

## 2021-07-15 LAB — STOOL CULTURE REFLEX - RSASHR

## 2021-07-15 LAB — STOOL CULTURE: E coli, Shiga toxin Assay: NEGATIVE

## 2021-07-15 LAB — STOOL CULTURE REFLEX - CMPCXR

## 2021-08-30 DIAGNOSIS — G4733 Obstructive sleep apnea (adult) (pediatric): Secondary | ICD-10-CM | POA: Diagnosis not present

## 2021-09-29 DIAGNOSIS — Z1329 Encounter for screening for other suspected endocrine disorder: Secondary | ICD-10-CM | POA: Diagnosis not present

## 2021-09-29 DIAGNOSIS — E7849 Other hyperlipidemia: Secondary | ICD-10-CM | POA: Diagnosis not present

## 2021-09-29 DIAGNOSIS — E782 Mixed hyperlipidemia: Secondary | ICD-10-CM | POA: Diagnosis not present

## 2021-09-29 DIAGNOSIS — E1122 Type 2 diabetes mellitus with diabetic chronic kidney disease: Secondary | ICD-10-CM | POA: Diagnosis not present

## 2021-09-29 DIAGNOSIS — I10 Essential (primary) hypertension: Secondary | ICD-10-CM | POA: Diagnosis not present

## 2021-09-29 DIAGNOSIS — E039 Hypothyroidism, unspecified: Secondary | ICD-10-CM | POA: Diagnosis not present

## 2021-10-04 DIAGNOSIS — E7849 Other hyperlipidemia: Secondary | ICD-10-CM | POA: Diagnosis not present

## 2021-10-04 DIAGNOSIS — Z91018 Allergy to other foods: Secondary | ICD-10-CM | POA: Diagnosis not present

## 2021-10-04 DIAGNOSIS — I25111 Atherosclerotic heart disease of native coronary artery with angina pectoris with documented spasm: Secondary | ICD-10-CM | POA: Diagnosis not present

## 2021-10-04 DIAGNOSIS — K760 Fatty (change of) liver, not elsewhere classified: Secondary | ICD-10-CM | POA: Diagnosis not present

## 2021-10-04 DIAGNOSIS — K529 Noninfective gastroenteritis and colitis, unspecified: Secondary | ICD-10-CM | POA: Diagnosis not present

## 2021-10-04 DIAGNOSIS — R4582 Worries: Secondary | ICD-10-CM | POA: Diagnosis not present

## 2021-10-04 DIAGNOSIS — I10 Essential (primary) hypertension: Secondary | ICD-10-CM | POA: Diagnosis not present

## 2021-10-14 DIAGNOSIS — D225 Melanocytic nevi of trunk: Secondary | ICD-10-CM | POA: Diagnosis not present

## 2021-10-14 DIAGNOSIS — Z1283 Encounter for screening for malignant neoplasm of skin: Secondary | ICD-10-CM | POA: Diagnosis not present

## 2021-10-30 DIAGNOSIS — G4733 Obstructive sleep apnea (adult) (pediatric): Secondary | ICD-10-CM | POA: Diagnosis not present

## 2021-11-25 DIAGNOSIS — R310 Gross hematuria: Secondary | ICD-10-CM | POA: Diagnosis not present

## 2021-11-30 DIAGNOSIS — G4733 Obstructive sleep apnea (adult) (pediatric): Secondary | ICD-10-CM | POA: Diagnosis not present

## 2021-12-12 ENCOUNTER — Emergency Department (HOSPITAL_BASED_OUTPATIENT_CLINIC_OR_DEPARTMENT_OTHER): Payer: Medicare Other

## 2021-12-12 ENCOUNTER — Encounter (HOSPITAL_BASED_OUTPATIENT_CLINIC_OR_DEPARTMENT_OTHER): Payer: Self-pay | Admitting: Emergency Medicine

## 2021-12-12 ENCOUNTER — Other Ambulatory Visit: Payer: Self-pay

## 2021-12-12 ENCOUNTER — Emergency Department (HOSPITAL_BASED_OUTPATIENT_CLINIC_OR_DEPARTMENT_OTHER)
Admission: EM | Admit: 2021-12-12 | Discharge: 2021-12-12 | Disposition: A | Discharge status: Home / Self Care | Payer: Medicare Other | Attending: Emergency Medicine | Admitting: Emergency Medicine

## 2021-12-12 DIAGNOSIS — E039 Hypothyroidism, unspecified: Secondary | ICD-10-CM | POA: Insufficient documentation

## 2021-12-12 DIAGNOSIS — I10 Essential (primary) hypertension: Secondary | ICD-10-CM | POA: Insufficient documentation

## 2021-12-12 DIAGNOSIS — I2510 Atherosclerotic heart disease of native coronary artery without angina pectoris: Secondary | ICD-10-CM | POA: Insufficient documentation

## 2021-12-12 DIAGNOSIS — R110 Nausea: Secondary | ICD-10-CM | POA: Diagnosis not present

## 2021-12-12 DIAGNOSIS — R1011 Right upper quadrant pain: Secondary | ICD-10-CM | POA: Diagnosis not present

## 2021-12-12 DIAGNOSIS — K760 Fatty (change of) liver, not elsewhere classified: Secondary | ICD-10-CM | POA: Diagnosis not present

## 2021-12-12 DIAGNOSIS — I700 Atherosclerosis of aorta: Secondary | ICD-10-CM | POA: Diagnosis not present

## 2021-12-12 DIAGNOSIS — R109 Unspecified abdominal pain: Secondary | ICD-10-CM | POA: Diagnosis not present

## 2021-12-12 DIAGNOSIS — D72829 Elevated white blood cell count, unspecified: Secondary | ICD-10-CM | POA: Insufficient documentation

## 2021-12-12 LAB — CBC
HCT: 42.2 % (ref 39.0–52.0)
Hemoglobin: 14.8 g/dL (ref 13.0–17.0)
MCH: 31.8 pg (ref 26.0–34.0)
MCHC: 35.1 g/dL (ref 30.0–36.0)
MCV: 90.6 fL (ref 80.0–100.0)
Platelets: 201 10*3/uL (ref 150–400)
RBC: 4.66 MIL/uL (ref 4.22–5.81)
RDW: 12.8 % (ref 11.5–15.5)
WBC: 13.4 10*3/uL — ABNORMAL HIGH (ref 4.0–10.5)
nRBC: 0 % (ref 0.0–0.2)

## 2021-12-12 LAB — COMPREHENSIVE METABOLIC PANEL
ALT: 28 U/L (ref 0–44)
AST: 25 U/L (ref 15–41)
Albumin: 4 g/dL (ref 3.5–5.0)
Alkaline Phosphatase: 56 U/L (ref 38–126)
Anion gap: 8 (ref 5–15)
BUN: 17 mg/dL (ref 6–20)
CO2: 30 mmol/L (ref 22–32)
Calcium: 9 mg/dL (ref 8.9–10.3)
Chloride: 99 mmol/L (ref 98–111)
Creatinine, Ser: 0.78 mg/dL (ref 0.61–1.24)
GFR, Estimated: >60 mL/min (ref >60)
Glucose, Bld: 122 mg/dL — ABNORMAL HIGH (ref 70–99)
Potassium: 3.1 mmol/L — ABNORMAL LOW (ref 3.5–5.1)
Sodium: 137 mmol/L (ref 135–145)
Total Bilirubin: 1.1 mg/dL (ref 0.3–1.2)
Total Protein: 7.5 g/dL (ref 6.5–8.1)

## 2021-12-12 LAB — LIPASE, BLOOD: Lipase: 25 U/L (ref 11–51)

## 2021-12-12 MED ORDER — ONDANSETRON HCL 4 MG/2ML IJ SOLN
4.0000 mg | Freq: Once | INTRAMUSCULAR | Status: AC
  Administered 2021-12-12: 4 mg via INTRAVENOUS
  Filled 2021-12-12: qty 2

## 2021-12-12 MED ORDER — DICYCLOMINE HCL 20 MG PO TABS: 20.0000 mg | ORAL_TABLET | Freq: Three times a day (TID) | ORAL | 0 refills | Status: DC | PRN

## 2021-12-12 MED ORDER — IOHEXOL 300 MG/ML  SOLN
100.0000 mL | Freq: Once | INTRAMUSCULAR | Status: AC | PRN
  Administered 2021-12-12: 100 mL via INTRAVENOUS

## 2021-12-12 MED ORDER — MORPHINE SULFATE (PF) 4 MG/ML IV SOLN
4.0000 mg | Freq: Once | INTRAVENOUS | Status: AC
  Administered 2021-12-12: 4 mg via INTRAVENOUS
  Filled 2021-12-12: qty 1

## 2021-12-12 MED ORDER — ONDANSETRON 4 MG PO TBDP: 4.0000 mg | ORAL_TABLET | Freq: Three times a day (TID) | ORAL | 0 refills | Status: DC | PRN

## 2021-12-12 MED ORDER — OXYCODONE-ACETAMINOPHEN 5-325 MG PO TABS
1.0000 | ORAL_TABLET | Freq: Once | ORAL | Status: AC
  Administered 2021-12-12: 1 via ORAL
  Filled 2021-12-12: qty 1

## 2021-12-12 MED ORDER — SENNOSIDES-DOCUSATE SODIUM 8.6-50 MG PO TABS: 1.0000 | ORAL_TABLET | Freq: Every evening | ORAL | 0 refills | Status: DC | PRN

## 2021-12-12 NOTE — ED Notes (Signed)
Patient verbalizes understanding of discharge instructions. Opportunity for questioning and answers were provided. Armband removed by staff, pt discharged from ED. Ambulated out to lobby with family ? ?
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
## 2021-12-12 NOTE — ED Notes (Signed)
Pt sts he would prefer to take the Percocet when he gets home because taking it this close the Morphine makes him feel claustrophobic; he would like to have his CPAP on when he takes it d/t the side effect of drowsiness; EDP is aware

## 2021-12-14 DIAGNOSIS — R1011 Right upper quadrant pain: Secondary | ICD-10-CM | POA: Diagnosis not present

## 2021-12-16 ENCOUNTER — Other Ambulatory Visit (HOSPITAL_COMMUNITY): Payer: Self-pay | Admitting: General Surgery

## 2021-12-16 ENCOUNTER — Other Ambulatory Visit: Payer: Self-pay | Admitting: General Surgery

## 2021-12-16 DIAGNOSIS — R1011 Right upper quadrant pain: Secondary | ICD-10-CM

## 2021-12-22 ENCOUNTER — Encounter (HOSPITAL_COMMUNITY)
Admission: RE | Admit: 2021-12-22 | Discharge: 2021-12-22 | Disposition: A | Discharge status: Home / Self Care | Payer: Medicare Other | Source: Ambulatory Visit | Attending: General Surgery | Admitting: General Surgery

## 2021-12-22 ENCOUNTER — Ambulatory Visit: Payer: Self-pay | Admitting: General Surgery

## 2021-12-22 DIAGNOSIS — R1011 Right upper quadrant pain: Secondary | ICD-10-CM | POA: Insufficient documentation

## 2021-12-22 DIAGNOSIS — K828 Other specified diseases of gallbladder: Secondary | ICD-10-CM

## 2021-12-22 MED ORDER — TECHNETIUM TC 99M MEBROFENIN IV KIT
5.0000 | PACK | Freq: Once | INTRAVENOUS | Status: AC | PRN
  Administered 2021-12-22: 5 via INTRAVENOUS

## 2021-12-24 ENCOUNTER — Telehealth: Payer: Self-pay | Admitting: Cardiovascular Disease

## 2021-12-24 DIAGNOSIS — E7849 Other hyperlipidemia: Secondary | ICD-10-CM | POA: Diagnosis not present

## 2021-12-24 DIAGNOSIS — E039 Hypothyroidism, unspecified: Secondary | ICD-10-CM | POA: Diagnosis not present

## 2021-12-24 DIAGNOSIS — K760 Fatty (change of) liver, not elsewhere classified: Secondary | ICD-10-CM | POA: Diagnosis not present

## 2021-12-24 DIAGNOSIS — R5383 Other fatigue: Secondary | ICD-10-CM | POA: Diagnosis not present

## 2021-12-24 DIAGNOSIS — E1122 Type 2 diabetes mellitus with diabetic chronic kidney disease: Secondary | ICD-10-CM | POA: Diagnosis not present

## 2021-12-24 NOTE — Telephone Encounter (Signed)
Called and spoke with pt's wife Joseph [name]. Pt has been followed by Dr Shirlee More w/[facility] system. Pt had last PCI in 2015 by Dr Salvadore Oxford. Wife states that Joseph [name] moved practice from [place] to [place] which has been inconvenient since pt lives in [place]. Pt was seen in [facility] ED in 2011 and per wife, Dr Joseph [name] placed his first stent 08/14/09. I'm unable to find this in his chart, but there is an encounter from [facility] conversion that won't open to show information. Patient is also seen by Joseph [name], general surgeon and was seen in office for consult visit for upcoming cholecystectomy. Dr Joseph [name] mentioned to patient that he personally sees Dr Joseph [name] and spoke highly of him. Pt is now wishing to be seen and followed by Dr Joseph [name]. Will route to MD for final approval on scheduling.  *pt is in the process of needing cardiac clearance for above surgery and is currently scheduled with Joseph [name]'s office for 01/04/22.

## 2021-12-24 NOTE — Telephone Encounter (Signed)
Patient's wife called and said that Dr. Burt Knack put a stint in patient. Patient is also associated with Dr. Marlou Starks. Patient is requesting to become a patient of Dr. Burt Knack. Informed patient that he is no longer taking patients but patient is insisting on becoming a patient of Dr. Burt Knack. Please call back to discuss

## 2021-12-27 NOTE — Telephone Encounter (Signed)
Sherren Mocha, MD  You 2 days ago    This is fine. I'm happy to see him. thx    Called and left message for pt's spouse [name]. Pt scheduled for 01/03/22.

## 2021-12-30 ENCOUNTER — Encounter: Payer: Self-pay | Admitting: Surgery

## 2021-12-30 ENCOUNTER — Other Ambulatory Visit: Payer: Self-pay | Admitting: Urology

## 2021-12-30 ENCOUNTER — Ambulatory Visit: Payer: Self-pay | Admitting: Surgery

## 2021-12-30 ENCOUNTER — Ambulatory Visit (INDEPENDENT_AMBULATORY_CARE_PROVIDER_SITE_OTHER): Payer: Medicare Other | Admitting: Surgery

## 2021-12-30 VITALS — BP 122/79 | HR 63 | Temp 97.6°F | Ht 72.0 in | Wt 267.6 lb

## 2021-12-30 DIAGNOSIS — I10 Essential (primary) hypertension: Secondary | ICD-10-CM | POA: Diagnosis not present

## 2021-12-30 DIAGNOSIS — N202 Calculus of kidney with calculus of ureter: Secondary | ICD-10-CM | POA: Diagnosis not present

## 2021-12-30 DIAGNOSIS — I25111 Atherosclerotic heart disease of native coronary artery with angina pectoris with documented spasm: Secondary | ICD-10-CM | POA: Diagnosis not present

## 2021-12-30 DIAGNOSIS — R7301 Impaired fasting glucose: Secondary | ICD-10-CM | POA: Diagnosis not present

## 2021-12-30 DIAGNOSIS — K811 Chronic cholecystitis: Secondary | ICD-10-CM | POA: Diagnosis not present

## 2021-12-30 DIAGNOSIS — E7849 Other hyperlipidemia: Secondary | ICD-10-CM | POA: Diagnosis not present

## 2021-12-30 DIAGNOSIS — K828 Other specified diseases of gallbladder: Secondary | ICD-10-CM

## 2021-12-30 DIAGNOSIS — E039 Hypothyroidism, unspecified: Secondary | ICD-10-CM | POA: Diagnosis not present

## 2021-12-30 DIAGNOSIS — Z91018 Allergy to other foods: Secondary | ICD-10-CM | POA: Diagnosis not present

## 2021-12-30 DIAGNOSIS — K760 Fatty (change of) liver, not elsewhere classified: Secondary | ICD-10-CM | POA: Diagnosis not present

## 2021-12-30 DIAGNOSIS — K7581 Nonalcoholic steatohepatitis (NASH): Secondary | ICD-10-CM | POA: Diagnosis not present

## 2021-12-30 DIAGNOSIS — R4582 Worries: Secondary | ICD-10-CM | POA: Diagnosis not present

## 2021-12-30 DIAGNOSIS — K8010 Calculus of gallbladder with chronic cholecystitis without obstruction: Secondary | ICD-10-CM

## 2021-12-30 DIAGNOSIS — G4733 Obstructive sleep apnea (adult) (pediatric): Secondary | ICD-10-CM | POA: Diagnosis not present

## 2021-12-30 DIAGNOSIS — Z79891 Long term (current) use of opiate analgesic: Secondary | ICD-10-CM | POA: Diagnosis not present

## 2021-12-30 NOTE — Patient Instructions (Signed)
Our surgery scheduler Barbara will call you within 24-48 hours to get you scheduled. If you have not heard from her after 48 hours, please call our office. Have the blue sheet available when she calls to write down important information.   If you have any concerns or questions, please feel free to call our office.    Minimally Invasive Cholecystectomy  Minimally invasive cholecystectomy is surgery to remove the gallbladder. The gallbladder is a pear-shaped organ that lies beneath the liver on the right side of the body. The gallbladder stores bile, which is a fluid that helps the body digest fats. Cholecystectomy is often done to treat inflammation (irritation and swelling) of the gallbladder (cholecystitis). This condition is usually caused by a buildup of gallstones (cholelithiasis) in the gallbladder or when the fluid in the gall bladder becomes stagnant because gallstones get stuck in the ducts (tubes) and block the flow of bile. This can result in inflammation and pain. In severe cases, emergency surgery may be required. This procedure is done through small incisions in the abdomen, instead of one large incision. It is also called laparoscopic surgery. A thin scope with a camera (laparoscope) is inserted through one incision. Then surgical instruments are inserted through the other incisions. In some cases, a minimally invasive surgery may need to be changed to a surgery that is done through a larger incision. This is called open surgery. Tell a health care provider about: Any allergies you have. All medicines you are taking, including vitamins, herbs, eye drops, creams, and over-the-counter medicines. Any problems you or family members have had with anesthetic medicines. Any bleeding problems you have. Any surgeries you have had. Any medical conditions you have. Whether you are pregnant or may be pregnant. What are the risks? Generally, this is a safe procedure. However, problems may occur,  including: Infection. Bleeding. Allergic reactions to medicines. Damage to nearby structures or organs. A gallstone remaining in the common bile duct. The common bile duct carries bile from the gallbladder to the small intestine. A bile leak from the liver or cystic duct after your gallbladder is removed. What happens before the procedure? When to stop eating and drinking Follow instructions from your health care provider about what you may eat and drink before your procedure. These may include: 8 hours before the procedure Stop eating most foods. Do not eat meat, fried foods, or fatty foods. Eat only light foods, such as toast or crackers. All liquids are okay except energy drinks and alcohol. 6 hours before the procedure Stop eating. Drink only clear liquids, such as water, clear fruit juice, black coffee, plain tea, and sports drinks. Do not drink energy drinks or alcohol. 2 hours before the procedure Stop drinking all liquids. You may be allowed to take medicines with small sips of water. If you do not follow your health care provider's instructions, your procedure may be delayed or canceled. Medicines Ask your health care provider about: Changing or stopping your regular medicines. This is especially important if you are taking diabetes medicines or blood thinners. Taking medicines such as aspirin and ibuprofen. These medicines can thin your blood. Do not take these medicines unless your health care provider tells you to take them. Taking over-the-counter medicines, vitamins, herbs, and supplements. General instructions If you will be going home right after the procedure, plan to have a responsible adult: Take you home from the hospital or clinic. You will not be allowed to drive. Care for you for the time you are   use any products that contain nicotine or tobacco for at least 4 weeks before the procedure. These products include cigarettes, chewing tobacco, and vaping  devices, such as e-cigarettes. If you need help quitting, ask your health care provider. Ask your health care provider: How your surgery site will be marked. What steps will be taken to help prevent infection. These may include: Removing hair at the surgery site. Washing skin with a germ-killing soap. Taking antibiotic medicine. What happens during the procedure?  An IV will be inserted into one of your veins. You will be given one or both of the following: A medicine to help you relax (sedative). A medicine to make you fall asleep (general anesthetic). Your surgeon will make several small incisions in your abdomen. The laparoscope will be inserted through one of the small incisions. The camera on the laparoscope will send images to a monitor in the operating room. This lets your surgeon see inside your abdomen. A gas will be pumped into your abdomen. This will expand your abdomen to give the surgeon more room to perform the surgery. Other tools that are needed for the procedure will be inserted through the other incisions. The gallbladder will be removed through one of the incisions. Your common bile duct may be examined. If stones are found in the common bile duct, they may be removed. After your gallbladder has been removed, the incisions will be closed with stitches (sutures), staples, or skin glue. Your incisions will be covered with a bandage (dressing). The procedure may vary among health care providers and hospitals. What happens after the procedure? Your blood pressure, heart rate, breathing rate, and blood oxygen level will be monitored until you leave the hospital or clinic. You will be given medicines as needed to control your pain. You may have a drain placed in the incision. The drain will be removed a day or two after the procedure. Summary Minimally invasive cholecystectomy, also called laparoscopic cholecystectomy, is surgery to remove the gallbladder using small  incisions. Tell your health care provider about all the medical conditions you have and all the medicines you are taking for those conditions. Before the procedure, follow instructions about when to stop eating and drinking and changing or stopping medicines. Plan to have a responsible adult care for you for the time you are told after you leave the hospital or clinic. This information is not intended to replace advice given to you by your health care provider. Make sure you discuss any questions you have with your health care provider.  Umbilical Hernia, Adult  A hernia is a bulge of tissue that pushes through an opening between muscles. An umbilical hernia happens in the abdomen, near the belly button (umbilicus). The hernia may contain tissues from the small intestine, large intestine, or fatty tissue covering the intestines. Umbilical hernias in adults tend to get worse over time, and they require surgical treatment. There are different types of umbilical hernias, including: Indirect hernia. This type is located just above or below the umbilicus. It is the most common type of umbilical hernia in adults. Direct hernia. This type forms through an opening formed by the umbilicus. Reducible hernia. This type of hernia comes and goes. It may be visible only when you strain, lift something heavy, or cough. This type of hernia can be pushed back into the abdomen (reduced). Incarcerated hernia. This type traps abdominal tissue inside the hernia. This type of hernia cannot be reduced. Strangulated hernia. This type of hernia cuts off  blood flow to the tissues inside the hernia. The tissues can start to die if this happens. This type of hernia requires emergency treatment. What are the causes? An umbilical hernia happens when tissue inside the abdomen presses on a weak area of the abdominal muscles. What increases the risk? You may have a greater risk of this condition if you: Are obese. Have had  several pregnancies. Have a buildup of fluid inside your abdomen. Have had surgery that weakens the abdominal muscles. What are the signs or symptoms? The main symptom of this condition is a painless bulge at or near the belly button. A reducible hernia may be visible only when you strain, lift something heavy, or cough. Other symptoms may include: Dull pain. A feeling of pressure. Symptoms of a strangulated hernia may include: Pain that gets increasingly worse. Nausea and vomiting. Pain when pressing on the hernia. Skin over the hernia becoming red or purple. Constipation. Blood in the stool. How is this diagnosed? This condition may be diagnosed based on: A physical exam. You may be asked to cough or strain while standing. These actions increase the pressure inside your abdomen and can force the hernia through the opening in your muscles. Your health care provider may try to reduce the hernia by pressing on it. Your symptoms and medical history. How is this treated? Surgery is the only treatment for an umbilical hernia. Surgery for a strangulated hernia is done as soon as possible. If you have a small hernia that is not incarcerated, you may need to lose weight before having surgery. Follow these instructions at home: Lose weight, if told by your health care provider. Do not try to push the hernia back in. Watch your hernia for any changes in color or size. Tell your health care provider if any changes occur. You may need to avoid activities that increase pressure on your hernia. Do not lift anything that is heavier than 10 lb (4.5 kg), or the limit that you are told, until your health care provider says that it is safe. Take over-the-counter and prescription medicines only as told by your health care provider. Keep all follow-up visits. This is important. Contact a health care provider if: Your hernia gets larger. Your hernia becomes painful. Get help right away if: You develop  sudden, severe pain near the area of your hernia. You have pain as well as nausea or vomiting. You have pain and the skin over your hernia changes color. You develop a fever or chills. Summary A hernia is a bulge of tissue that pushes through an opening between muscles. An umbilical hernia happens near the belly button. Surgery is the only treatment for an umbilical hernia. Do not try to push your hernia back in. Keep all follow-up visits. This is important. This information is not intended to replace advice given to you by your health care provider. Make sure you discuss any questions you have with your health care provider. Document Revised: 01/13/2020 Document Reviewed: 01/13/2020 Elsevier Patient Education  [place].
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
## 2021-12-31 ENCOUNTER — Telehealth: Payer: Self-pay | Admitting: Surgery

## 2021-12-31 ENCOUNTER — Encounter (HOSPITAL_BASED_OUTPATIENT_CLINIC_OR_DEPARTMENT_OTHER): Payer: Self-pay | Admitting: Urology

## 2021-12-31 NOTE — Progress Notes (Signed)
Patient called for pre-op instructions but did not answer. Left voicemail.

## 2021-12-31 NOTE — Progress Notes (Signed)
Patient pre-op phone call complete. Allergies, medications, and medical history verified. Patient arrival time and procedure date verified. Patient to bring CPAP machine with him to procedure. [facility] verified that it was okay for patient to take aspirin until the day of procedure because stone was outside of the renal shadow. Patient to stop all vitamins. Patient and wife notified of medication instructions. Wife informed RN that with previous lithotripsy patient had to stand up and then was fine to continue treatment. Wife also informed that patient is due to have his gallbladder removed on Wednesday. Driver secured.

## 2021-12-31 NOTE — Telephone Encounter (Signed)
Patient has been advised of Pre-Admission date/time, and Surgery date.  Surgery Date: 01/05/22 Preadmission Testing Date: 01/04/22 (phone 8a-1p)  Patient has been made aware to call [phone], between 1-3:00pm the day before surgery, to find out what time to arrive for surgery.

## 2022-01-03 ENCOUNTER — Encounter (HOSPITAL_BASED_OUTPATIENT_CLINIC_OR_DEPARTMENT_OTHER): Payer: Self-pay | Admitting: Urology

## 2022-01-03 ENCOUNTER — Encounter (HOSPITAL_BASED_OUTPATIENT_CLINIC_OR_DEPARTMENT_OTHER)
Admission: RE | Disposition: A | Discharge status: Home / Self Care | Payer: Self-pay | Source: Home / Self Care | Attending: Urology

## 2022-01-03 ENCOUNTER — Ambulatory Visit (HOSPITAL_COMMUNITY): Payer: Medicare Other

## 2022-01-03 ENCOUNTER — Ambulatory Visit (HOSPITAL_BASED_OUTPATIENT_CLINIC_OR_DEPARTMENT_OTHER)
Admission: RE | Admit: 2022-01-03 | Discharge: 2022-01-03 | Disposition: A | Discharge status: Home / Self Care | Payer: Medicare Other | Attending: Urology | Admitting: Urology

## 2022-01-03 ENCOUNTER — Ambulatory Visit: Payer: Medicare Other | Admitting: Cardiovascular Disease

## 2022-01-03 DIAGNOSIS — Z955 Presence of coronary angioplasty implant and graft: Secondary | ICD-10-CM | POA: Diagnosis not present

## 2022-01-03 DIAGNOSIS — E669 Obesity, unspecified: Secondary | ICD-10-CM | POA: Diagnosis not present

## 2022-01-03 DIAGNOSIS — G4730 Sleep apnea, unspecified: Secondary | ICD-10-CM | POA: Diagnosis not present

## 2022-01-03 DIAGNOSIS — Z01818 Encounter for other preprocedural examination: Secondary | ICD-10-CM | POA: Diagnosis not present

## 2022-01-03 DIAGNOSIS — N201 Calculus of ureter: Secondary | ICD-10-CM | POA: Diagnosis not present

## 2022-01-03 DIAGNOSIS — N200 Calculus of kidney: Secondary | ICD-10-CM | POA: Diagnosis not present

## 2022-01-03 DIAGNOSIS — Z96642 Presence of left artificial hip joint: Secondary | ICD-10-CM | POA: Diagnosis not present

## 2022-01-03 HISTORY — PX: EXTRACORPOREAL SHOCK WAVE LITHOTRIPSY: SHX1557

## 2022-01-03 HISTORY — DX: Anxiety disorder, unspecified: F41.9

## 2022-01-03 SURGERY — LITHOTRIPSY, ESWL
Anesthesia: LOCAL | Laterality: Left

## 2022-01-03 MED ORDER — DIPHENHYDRAMINE HCL 25 MG PO CAPS
ORAL_CAPSULE | ORAL | Status: AC
  Filled 2022-01-03: qty 1

## 2022-01-03 MED ORDER — DIPHENHYDRAMINE HCL 25 MG PO CAPS
25.0000 mg | ORAL_CAPSULE | ORAL | Status: AC
  Administered 2022-01-03: 25 mg via ORAL

## 2022-01-03 MED ORDER — DIAZEPAM 5 MG PO TABS
ORAL_TABLET | ORAL | Status: AC
  Filled 2022-01-03: qty 2

## 2022-01-03 MED ORDER — CIPROFLOXACIN HCL 500 MG PO TABS
ORAL_TABLET | ORAL | Status: AC
  Filled 2022-01-03: qty 1

## 2022-01-03 MED ORDER — CIPROFLOXACIN HCL 500 MG PO TABS
500.0000 mg | ORAL_TABLET | ORAL | Status: AC
  Administered 2022-01-03: 500 mg via ORAL

## 2022-01-03 MED ORDER — DIAZEPAM 5 MG PO TABS
10.0000 mg | ORAL_TABLET | ORAL | Status: AC
  Administered 2022-01-03: 10 mg via ORAL

## 2022-01-03 MED ORDER — SODIUM CHLORIDE 0.9 % IV SOLN: INTRAVENOUS | Status: DC

## 2022-01-03 NOTE — H&P (Signed)
See scanned H&P

## 2022-01-03 NOTE — Op Note (Signed)
See [facility] OP note scanned into chart. Also because of the size, density, location and other factors that cannot be anticipated I feel this will likely be a staged procedure. This fact supersedes any indication in the scanned [facility] operative note to the contrary.  

## 2022-01-04 ENCOUNTER — Other Ambulatory Visit: Payer: Self-pay

## 2022-01-04 ENCOUNTER — Encounter (HOSPITAL_BASED_OUTPATIENT_CLINIC_OR_DEPARTMENT_OTHER): Payer: Self-pay | Admitting: Urology

## 2022-01-04 ENCOUNTER — Encounter
Admission: RE | Admit: 2022-01-04 | Discharge: 2022-01-04 | Disposition: A | Discharge status: Home / Self Care | Payer: Medicare Other | Source: Ambulatory Visit | Attending: Surgery | Admitting: Surgery

## 2022-01-04 ENCOUNTER — Encounter: Payer: Self-pay | Admitting: Surgery

## 2022-01-04 DIAGNOSIS — Z01812 Encounter for preprocedural laboratory examination: Secondary | ICD-10-CM

## 2022-01-04 HISTORY — DX: Pneumonia, unspecified organism: J18.9

## 2022-01-04 HISTORY — DX: Angina pectoris, unspecified: I20.9

## 2022-01-04 MED ORDER — INDOCYANINE GREEN 25 MG IV SOLR
1.2500 mg | Freq: Once | INTRAVENOUS | Status: AC
  Administered 2022-01-05: 1.25 mg via INTRAVENOUS
  Filled 2022-01-04: qty 0.5

## 2022-01-04 NOTE — Progress Notes (Signed)
  Perioperative Services Pre-Admission/Anesthesia Testing     Date: 01/04/22  Name: Joseph [name]. MRN:   [id number]  Re: Change in ABX for upcoming surgery   Case: [id number] Date/Time: 01/05/22 1029   Procedures:      XI ROBOTIC ASSISTED LAPAROSCOPIC CHOLECYSTECTOMY     INDOCYANINE GREEN FLUORESCENCE IMAGING (ICG)     HERNIA REPAIR UMBILICAL ADULT   Anesthesia type: General   Pre-op diagnosis: Cholecystits, umbilical  hernia less 3 cm   Location: [facility] OR ROOM 04 / [place] [facility]   Surgeons: Ronny Bacon, MD   Primary attending surgeon was consulted regarding consideration of therapeutic change in antimicrobial agent being used for preoperative prophylaxis in this patient's upcoming surgical case. Following analysis of the risk versus benefits, the patient's primary attending surgeon advised that it would be acceptable to discontinue the ordered ciprofloxacin and place an order for cefazolin 2 gm IV on call to the OR. Orders for this patient were amended by me following collaborative conversation with attending surgeon taking into consideration of risk versus benefits associated with the change in therapy.  Honor Loh, MSN, APRN, FNP-C, CEN [facility]  Peri-operative Services Nurse Practitioner Phone: [phone] 01/04/22 12:34 PM

## 2022-01-04 NOTE — Progress Notes (Signed)
Perioperative Services Pre-Admission/Anesthesia Testing   Date: 01/04/22 Name: Joseph [name]. MRN:   [id number]  Re: Consideration of preoperative prophylactic antibiotic change   Request sent to: Ronny Bacon, MD (routed and/or faxed via [facility])  Planned Surgical Procedure(s):    Case: [id number] Date/Time: 01/05/22 1029   Procedures:      XI ROBOTIC ASSISTED LAPAROSCOPIC CHOLECYSTECTOMY     INDOCYANINE GREEN FLUORESCENCE IMAGING (ICG)     HERNIA REPAIR UMBILICAL ADULT   Anesthesia type: General   Pre-op diagnosis: Cholecystits, umbilical  hernia less 3 cm   Location: [place] OR ROOM 04 / [place] [facility]   Surgeons: Ronny Bacon, MD   Clinical Notes:  Patient has a documented allergy/intolerance to PCN  Advising that PCN has caused him to experience anaphylaxis in the past.   EMR review indicated that patient received PCN and/or cephalosporin in the past as follows: CEFAZOLIN received on 02/07/2014 and 04/08/2015 with no documented ADRs.   Screened as appropriate for cephalosporin use during medication reconciliation No immediate angioedema, dysphagia, SOB, anaphylaxis symptoms. No severe rash involving mucous membranes or skin necrosis. No hospital admissions related to side effects of PCN/cephalosporin use.  No documented reaction to PCN or cephalosporin in the last 10 years.  Request:  As an evidence based approach to reducing the rate of incidence for post-operative SSI and the development of MDROs, could an agent that allows for narrower antimicrobial coverage for preoperative prophylaxis in this patient's upcoming surgical course be considered?   Currently ordered preoperative prophylactic ABX: ciprofloxacin.   Specifically requesting change to cephalosporin (CEFAZOLIN).  Drug of choice for many procedures; it is the most widely studied antimicrobial agent with proven efficacy for antimicrobial prophylaxis.   Desirable duration of action, spectrum of  activity against organisms commonly encountered in surgery, and it has an excellent safety profile and low cost.   Active against streptococci, methicillin-susceptible staphylococci, and many gram-negative organisms.  Please communicate decision with me and I will change the orders in Epic as per your direction.   Things to consider: Many patients report that they were "allergic" to PCN earlier in life, however this does not translate into a true lifelong allergy. Patients can lose sensitivity to specific IgE antibodies over time if PCN is avoided (Kleris & Lugar, 2019).  Up to 10% of the adult population and 15% of hospitalized patients report an allergy to PCN, however clinical studies suggest that 90% of those reporting an allergy can tolerate PCN antibiotics (Kleris & Lugar, 2019).  Cross-sensitivity between PCN and cephalosporins has been documented as being as high as 10%, however this estimation included data believed to have been collected in a setting where there was contamination. Newer data suggests that the prevalence of cross-sensitivity between PCN and cephalosporins is actually estimated to be closer to 1% (Hermanides et al., 2018).   Patients labeled as PCN allergic, whether they are truly allergic or not, have been found to have inferior outcomes in terms of rates of serious infection, and these patients tend to have longer hospital stays ([place], 2019).  Treatment related secondary infections, such as Clostridioides difficile, have been linked to the improper use of broad spectrum antibiotics in patients improperly labeled as PCN allergic (Kleris & Lugar, 2019).  Anaphylaxis from cephalosporins is rare and the evidence suggests that there is no increased risk of an anaphylactic type reaction when cephalosporins are used in a PCN allergic patient (Pichichero, 2006).  Citations: Hermanides J, Lemkes BA, Prins JM,  Hollmann MW, [place] I. Presumed ?-Lactam Allergy and  Cross-reactivity in the Operating Theater: A Practical Approach. Anesthesiology. 2018 Aug;129(2):335-342. doi: 10.1097/ALN.0000000000002252. PMID: 08657846.  Kleris, [place]., & Lugar, P. L. (2019). Things We Do For No Reason: Failing to Question a Penicillin Allergy History. Journal of hospital medicine, 14(10), [phone]) [phone]. Advance online publication. https://www.wallace-[place].info/  Pichichero, M. E. (2006). Cephalosporins can be prescribed safely for penicillin-allergic patients. Journal of family medicine, 55(2), 106-112. Accessed: https://cdn.mdedge.com/files/s74f-public/Document/September-2017/5502JFP_AppliedEvidence1.pdf   BHonor Loh MSN, APRN, FNP-C, CEN [facility] Peri-operative Services Nurse Practitioner FAX: ([phone]/18/23 11:20 AM

## 2022-01-04 NOTE — Patient Instructions (Addendum)
Your procedure is scheduled on: 01/05/22 - Wednesday Report to the Registration Desk on the 1st floor of the [facility]. To find out your arrival time, please call [phone] between 1PM - 3PM on:01/04/22 - Tuesday If your arrival time is 6:00 am, do not arrive prior to that time as the [facility] entrance doors do not open until 6:00 am.  REMEMBER: Instructions that are not followed completely may result in serious medical risk, up to and including death; or upon the discretion of your surgeon and anesthesiologist your surgery may need to be rescheduled.  Do not eat food or drink any fluids after midnight the night before surgery.  No gum chewing, lozengers or hard candies.   TAKE ONLY THESE MEDICATIONS THE MORNING OF SURGERY WITH A SIP OF WATER:  - amLODipine (NORVASC) 10 MG tablet - busPIRone (BUSPAR) 15 MG tablet - levothyroxine (SYNTHROID, LEVOTHROID) 225 MCG tablet - Oxycodone HCl 20 MG TABS  One week prior to surgery: Stop Anti-inflammatories (NSAIDS) such as Advil, Aleve, Ibuprofen, Motrin, Naproxen, Naprosyn and Aspirin based products such as Excedrin, Goodys Powder, BC Powder.  Stop ANY OVER THE COUNTER supplements until after surgery.  You may take Tylenol if needed for pain up until the day of surgery.  No Alcohol for 24 hours before or after surgery.  No Smoking including e-cigarettes for 24 hours prior to surgery.  No chewable tobacco products for at least 6 hours prior to surgery.  No nicotine patches on the day of surgery.  Do not use any "recreational" drugs for at least a week prior to your surgery.  Please be advised that the combination of cocaine and anesthesia may have negative outcomes, up to and including death. If you test positive for cocaine, your surgery will be cancelled.  On the morning of surgery brush your teeth with toothpaste and water, you may rinse your mouth with mouthwash if you wish. Do not swallow any toothpaste or mouthwash.  Do  not wear jewelry, make-up, hairpins, clips or nail polish.  Do not wear lotions, powders, or perfumes.   Do not shave body from the neck down 48 hours prior to surgery just in case you cut yourself which could leave a site for infection.  Also, freshly shaved skin may become irritated if using the CHG soap.  Contact lenses, hearing aids and dentures may not be worn into surgery.  Do not bring valuables to the hospital. [facility] is not responsible for any missing/lost belongings or valuables.   Bring your C-PAP to the hospital with you in case you may have to spend the night.   Notify your doctor if there is any change in your medical condition (cold, fever, infection).  Wear comfortable clothing (specific to your surgery type) to the hospital.  After surgery, you can help prevent lung complications by doing breathing exercises.  Take deep breaths and cough every 1-2 hours. Your doctor may order a device called an Incentive Spirometer to help you take deep breaths. When coughing or sneezing, hold a pillow firmly against your incision with both hands. This is called "splinting." Doing this helps protect your incision. It also decreases belly discomfort.  If you are being admitted to the hospital overnight, leave your suitcase in the car. After surgery it may be brought to your room.  If you are being discharged the day of surgery, you will not be allowed to drive home. You will need a responsible adult (18 years or older) to drive you  home and stay with you that night.   If you are taking public transportation, you will need to have a responsible adult (18 years or older) with you. Please confirm with your physician that it is acceptable to use public transportation.   Please call the [facility] at [phone] if you have any questions about these instructions.  Surgery Visitation Policy:  Patients undergoing a surgery or procedure may have two family members  or support persons with them as long as the person is not COVID-19 positive or experiencing its symptoms.   Inpatient Visitation:    Visiting hours are 7 a.m. to 8 p.m. Up to four visitors are allowed at one time in a patient room, including children. The visitors may rotate out with other people during the day. One designated support person (adult) may remain overnight.

## 2022-01-05 ENCOUNTER — Ambulatory Visit: Payer: Medicare Other | Admitting: Urgent Care

## 2022-01-05 ENCOUNTER — Encounter
Admission: RE | Disposition: A | Discharge status: Home / Self Care | Payer: Self-pay | Source: Home / Self Care | Attending: Surgery

## 2022-01-05 ENCOUNTER — Other Ambulatory Visit: Payer: Self-pay

## 2022-01-05 ENCOUNTER — Encounter: Payer: Self-pay | Admitting: Surgery

## 2022-01-05 ENCOUNTER — Ambulatory Visit
Admission: RE | Admit: 2022-01-05 | Discharge: 2022-01-05 | Disposition: A | Discharge status: Home / Self Care | Payer: Medicare Other | Attending: Surgery | Admitting: Surgery

## 2022-01-05 DIAGNOSIS — I447 Left bundle-branch block, unspecified: Secondary | ICD-10-CM | POA: Diagnosis not present

## 2022-01-05 DIAGNOSIS — K828 Other specified diseases of gallbladder: Secondary | ICD-10-CM

## 2022-01-05 DIAGNOSIS — K8012 Calculus of gallbladder with acute and chronic cholecystitis without obstruction: Secondary | ICD-10-CM | POA: Diagnosis not present

## 2022-01-05 DIAGNOSIS — Z79890 Hormone replacement therapy (postmenopausal): Secondary | ICD-10-CM | POA: Diagnosis not present

## 2022-01-05 DIAGNOSIS — G4733 Obstructive sleep apnea (adult) (pediatric): Secondary | ICD-10-CM | POA: Diagnosis not present

## 2022-01-05 DIAGNOSIS — Z01812 Encounter for preprocedural laboratory examination: Secondary | ICD-10-CM

## 2022-01-05 DIAGNOSIS — K8010 Calculus of gallbladder with chronic cholecystitis without obstruction: Secondary | ICD-10-CM

## 2022-01-05 DIAGNOSIS — K429 Umbilical hernia without obstruction or gangrene: Secondary | ICD-10-CM | POA: Diagnosis not present

## 2022-01-05 DIAGNOSIS — M545 Low back pain, unspecified: Secondary | ICD-10-CM | POA: Insufficient documentation

## 2022-01-05 DIAGNOSIS — G8929 Other chronic pain: Secondary | ICD-10-CM | POA: Insufficient documentation

## 2022-01-05 DIAGNOSIS — Z87891 Personal history of nicotine dependence: Secondary | ICD-10-CM | POA: Insufficient documentation

## 2022-01-05 DIAGNOSIS — Z7901 Long term (current) use of anticoagulants: Secondary | ICD-10-CM | POA: Insufficient documentation

## 2022-01-05 DIAGNOSIS — F419 Anxiety disorder, unspecified: Secondary | ICD-10-CM | POA: Insufficient documentation

## 2022-01-05 DIAGNOSIS — I2510 Atherosclerotic heart disease of native coronary artery without angina pectoris: Secondary | ICD-10-CM | POA: Insufficient documentation

## 2022-01-05 DIAGNOSIS — Z79899 Other long term (current) drug therapy: Secondary | ICD-10-CM | POA: Diagnosis not present

## 2022-01-05 DIAGNOSIS — I10 Essential (primary) hypertension: Secondary | ICD-10-CM | POA: Diagnosis not present

## 2022-01-05 DIAGNOSIS — E039 Hypothyroidism, unspecified: Secondary | ICD-10-CM | POA: Insufficient documentation

## 2022-01-05 DIAGNOSIS — K811 Chronic cholecystitis: Secondary | ICD-10-CM | POA: Diagnosis not present

## 2022-01-05 DIAGNOSIS — K819 Cholecystitis, unspecified: Secondary | ICD-10-CM | POA: Diagnosis not present

## 2022-01-05 DIAGNOSIS — Z955 Presence of coronary angioplasty implant and graft: Secondary | ICD-10-CM | POA: Insufficient documentation

## 2022-01-05 HISTORY — DX: Opioid use, unspecified, uncomplicated: F11.90

## 2022-01-05 HISTORY — DX: Left bundle-branch block, unspecified: I44.7

## 2022-01-05 HISTORY — DX: Benign prostatic hyperplasia without lower urinary tract symptoms: N40.0

## 2022-01-05 HISTORY — DX: Panic disorder (episodic paroxysmal anxiety): F41.0

## 2022-01-05 HISTORY — PX: UMBILICAL HERNIA REPAIR: SHX196

## 2022-01-05 SURGERY — CHOLECYSTECTOMY, ROBOT-ASSISTED, LAPAROSCOPIC
Anesthesia: General | Site: Abdomen

## 2022-01-05 MED ORDER — SUCCINYLCHOLINE CHLORIDE 200 MG/10ML IV SOSY
PREFILLED_SYRINGE | INTRAVENOUS | Status: DC | PRN
  Administered 2022-01-05: 120 mg via INTRAVENOUS

## 2022-01-05 MED ORDER — GLYCOPYRROLATE 0.2 MG/ML IJ SOLN
INTRAMUSCULAR | Status: DC | PRN
  Administered 2022-01-05: .2 mg via INTRAVENOUS

## 2022-01-05 MED ORDER — BUPIVACAINE-EPINEPHRINE 0.25% -1:200000 IJ SOLN
INTRAMUSCULAR | Status: DC | PRN
  Administered 2022-01-05: 30 mL

## 2022-01-05 MED ORDER — PHENYLEPHRINE HCL-NACL 20-0.9 MG/250ML-% IV SOLN
INTRAVENOUS | Status: AC
  Filled 2022-01-05: qty 500

## 2022-01-05 MED ORDER — FAMOTIDINE 20 MG PO TABS: 20.0000 mg | ORAL_TABLET | Freq: Once | ORAL | Status: AC

## 2022-01-05 MED ORDER — MIDAZOLAM HCL 2 MG/2ML IJ SOLN
INTRAMUSCULAR | Status: AC
  Filled 2022-01-05: qty 2

## 2022-01-05 MED ORDER — DEXAMETHASONE SODIUM PHOSPHATE 10 MG/ML IJ SOLN
INTRAMUSCULAR | Status: DC | PRN
  Administered 2022-01-05: 10 mg via INTRAVENOUS

## 2022-01-05 MED ORDER — MIDAZOLAM HCL 2 MG/2ML IJ SOLN
INTRAMUSCULAR | Status: DC | PRN
  Administered 2022-01-05: 2 mg via INTRAVENOUS

## 2022-01-05 MED ORDER — BUPIVACAINE-EPINEPHRINE (PF) 0.25% -1:200000 IJ SOLN
INTRAMUSCULAR | Status: AC
  Filled 2022-01-05: qty 30

## 2022-01-05 MED ORDER — GABAPENTIN 300 MG PO CAPS: 300.0000 mg | ORAL_CAPSULE | ORAL | Status: DC

## 2022-01-05 MED ORDER — METOPROLOL TARTRATE 5 MG/5ML IV SOLN
INTRAVENOUS | Status: DC | PRN
  Administered 2022-01-05: 1 mg via INTRAVENOUS

## 2022-01-05 MED ORDER — GABAPENTIN 300 MG PO CAPS
ORAL_CAPSULE | ORAL | Status: AC
  Administered 2022-01-05: 300 mg via ORAL
  Filled 2022-01-05: qty 1

## 2022-01-05 MED ORDER — ACETAMINOPHEN 500 MG PO TABS: 1000.0000 mg | ORAL_TABLET | ORAL | Status: DC

## 2022-01-05 MED ORDER — HYDROMORPHONE HCL 1 MG/ML IJ SOLN
INTRAMUSCULAR | Status: DC | PRN
  Administered 2022-01-05: 1 mg via INTRAVENOUS

## 2022-01-05 MED ORDER — CEFAZOLIN SODIUM-DEXTROSE 2-4 GM/100ML-% IV SOLN
INTRAVENOUS | Status: AC
  Filled 2022-01-05: qty 100

## 2022-01-05 MED ORDER — CHLORHEXIDINE GLUCONATE CLOTH 2 % EX PADS: 6.0000 | MEDICATED_PAD | Freq: Once | CUTANEOUS | Status: DC

## 2022-01-05 MED ORDER — FAMOTIDINE 20 MG PO TABS
ORAL_TABLET | ORAL | Status: AC
  Administered 2022-01-05: 20 mg via ORAL
  Filled 2022-01-05: qty 1

## 2022-01-05 MED ORDER — CIPROFLOXACIN IN D5W 400 MG/200ML IV SOLN: 400.0000 mg | INTRAVENOUS | Status: DC

## 2022-01-05 MED ORDER — PROPOFOL 10 MG/ML IV BOLUS
INTRAVENOUS | Status: DC | PRN
  Administered 2022-01-05: 200 mg via INTRAVENOUS

## 2022-01-05 MED ORDER — OXYCODONE HCL 5 MG/5ML PO SOLN: 5.0000 mg | Freq: Once | ORAL | Status: AC | PRN

## 2022-01-05 MED ORDER — CELECOXIB 200 MG PO CAPS: 200.0000 mg | ORAL_CAPSULE | ORAL | Status: DC

## 2022-01-05 MED ORDER — LIDOCAINE HCL (CARDIAC) PF 100 MG/5ML IV SOSY
PREFILLED_SYRINGE | INTRAVENOUS | Status: DC | PRN
  Administered 2022-01-05: 100 mg via INTRAVENOUS

## 2022-01-05 MED ORDER — DEXMEDETOMIDINE HCL IN NACL 200 MCG/50ML IV SOLN
INTRAVENOUS | Status: DC | PRN
  Administered 2022-01-05: 12 ug via INTRAVENOUS

## 2022-01-05 MED ORDER — PHENYLEPHRINE HCL-NACL 20-0.9 MG/250ML-% IV SOLN
INTRAVENOUS | Status: DC | PRN
  Administered 2022-01-05: 20 ug/min via INTRAVENOUS

## 2022-01-05 MED ORDER — CHLORHEXIDINE GLUCONATE 0.12 % MT SOLN: 15.0000 mL | Freq: Once | OROMUCOSAL | Status: AC

## 2022-01-05 MED ORDER — OXYCODONE HCL 20 MG PO TABS
20.0000 mg | ORAL_TABLET | ORAL | 0 refills | Status: AC
Start: 2022-01-05 — End: ?

## 2022-01-05 MED ORDER — CHLORHEXIDINE GLUCONATE 0.12 % MT SOLN
OROMUCOSAL | Status: AC
  Administered 2022-01-05: 15 mL via OROMUCOSAL
  Filled 2022-01-05: qty 15

## 2022-01-05 MED ORDER — FENTANYL CITRATE (PF) 100 MCG/2ML IJ SOLN
INTRAMUSCULAR | Status: AC
  Filled 2022-01-05: qty 2

## 2022-01-05 MED ORDER — ACETAMINOPHEN 500 MG PO TABS
ORAL_TABLET | ORAL | Status: AC
  Administered 2022-01-05: 1000 mg via ORAL
  Filled 2022-01-05: qty 2

## 2022-01-05 MED ORDER — FENTANYL CITRATE (PF) 100 MCG/2ML IJ SOLN
INTRAMUSCULAR | Status: AC
  Administered 2022-01-05: 50 ug via INTRAVENOUS
  Filled 2022-01-05: qty 2

## 2022-01-05 MED ORDER — 0.9 % SODIUM CHLORIDE (POUR BTL) OPTIME
TOPICAL | Status: DC | PRN
  Administered 2022-01-05: 500 mL

## 2022-01-05 MED ORDER — CEFAZOLIN SODIUM-DEXTROSE 2-4 GM/100ML-% IV SOLN
2.0000 g | Freq: Once | INTRAVENOUS | Status: AC
  Administered 2022-01-05: 2 g via INTRAVENOUS

## 2022-01-05 MED ORDER — ONDANSETRON HCL 4 MG/2ML IJ SOLN
INTRAMUSCULAR | Status: DC | PRN
  Administered 2022-01-05 (×2): 4 mg via INTRAVENOUS

## 2022-01-05 MED ORDER — FENTANYL CITRATE (PF) 100 MCG/2ML IJ SOLN
25.0000 ug | INTRAMUSCULAR | Status: DC | PRN
  Administered 2022-01-05: 50 ug via INTRAVENOUS

## 2022-01-05 MED ORDER — HYDROMORPHONE HCL 1 MG/ML IJ SOLN
INTRAMUSCULAR | Status: AC
  Filled 2022-01-05: qty 1

## 2022-01-05 MED ORDER — ROCURONIUM BROMIDE 100 MG/10ML IV SOLN
INTRAVENOUS | Status: DC | PRN
  Administered 2022-01-05: 20 mg via INTRAVENOUS
  Administered 2022-01-05: 50 mg via INTRAVENOUS

## 2022-01-05 MED ORDER — BUPIVACAINE LIPOSOME 1.3 % IJ SUSP: 20.0000 mL | Freq: Once | INTRAMUSCULAR | Status: DC

## 2022-01-05 MED ORDER — LACTATED RINGERS IV SOLN: INTRAVENOUS | Status: DC

## 2022-01-05 MED ORDER — ESMOLOL HCL 100 MG/10ML IV SOLN
INTRAVENOUS | Status: DC | PRN
  Administered 2022-01-05: 10 mg via INTRAVENOUS

## 2022-01-05 MED ORDER — ORAL CARE MOUTH RINSE: 15.0000 mL | Freq: Once | OROMUCOSAL | Status: AC

## 2022-01-05 MED ORDER — OXYCODONE HCL 5 MG PO TABS
ORAL_TABLET | ORAL | Status: AC
  Filled 2022-01-05: qty 1

## 2022-01-05 MED ORDER — SUGAMMADEX SODIUM 500 MG/5ML IV SOLN
INTRAVENOUS | Status: DC | PRN
  Administered 2022-01-05: 400 mg via INTRAVENOUS

## 2022-01-05 MED ORDER — OXYCODONE HCL 5 MG PO TABS
5.0000 mg | ORAL_TABLET | Freq: Once | ORAL | Status: AC | PRN
  Administered 2022-01-05: 5 mg via ORAL

## 2022-01-05 MED ORDER — CELECOXIB 200 MG PO CAPS
ORAL_CAPSULE | ORAL | Status: AC
  Administered 2022-01-05: 200 mg via ORAL
  Filled 2022-01-05: qty 1

## 2022-01-05 MED ORDER — FENTANYL CITRATE (PF) 100 MCG/2ML IJ SOLN
INTRAMUSCULAR | Status: DC | PRN
  Administered 2022-01-05 (×2): 50 ug via INTRAVENOUS

## 2022-01-05 MED ORDER — BUPIVACAINE LIPOSOME 1.3 % IJ SUSP
INTRAMUSCULAR | Status: AC
  Filled 2022-01-05: qty 20

## 2022-01-05 MED ORDER — EPHEDRINE SULFATE (PRESSORS) 50 MG/ML IJ SOLN
INTRAMUSCULAR | Status: DC | PRN
  Administered 2022-01-05 (×2): 5 mg via INTRAVENOUS

## 2022-01-05 SURGICAL SUPPLY — 60 items
ADH SKN CLS APL DERMABOND .7 (GAUZE/BANDAGES/DRESSINGS) ×2
APL PRP STRL LF DISP 70% ISPRP (MISCELLANEOUS) ×2
BAG PRESSURE INF REUSE 3000 (BAG) IMPLANT
BINDER ABDOMINAL 12 ML 46-62 (SOFTGOODS) ×1 IMPLANT
BLADE SURG 15 STRL LF DISP TIS (BLADE) ×2 IMPLANT
BLADE SURG 15 STRL SS (BLADE) ×3
CHLORAPREP W/TINT 26 (MISCELLANEOUS) ×3 IMPLANT
CLIP LIGATING HEM O LOK PURPLE (MISCELLANEOUS) ×3 IMPLANT
COVER TIP SHEARS 8 DVNC (MISCELLANEOUS) ×2 IMPLANT
COVER TIP SHEARS 8MM DA VINCI (MISCELLANEOUS) ×3
DERMABOND ADVANCED (GAUZE/BANDAGES/DRESSINGS) ×1
DERMABOND ADVANCED .7 DNX12 (GAUZE/BANDAGES/DRESSINGS) ×2 IMPLANT
DRAPE ARM DVNC X/XI (DISPOSABLE) ×8 IMPLANT
DRAPE COLUMN DVNC XI (DISPOSABLE) ×2 IMPLANT
DRAPE DA VINCI XI ARM (DISPOSABLE) ×12
DRAPE DA VINCI XI COLUMN (DISPOSABLE) ×3
DRAPE LAPAROTOMY 77X122 PED (DRAPES) ×3 IMPLANT
ELECT CAUTERY BLADE 6.4 (BLADE) ×3 IMPLANT
ELECT REM PT RETURN 9FT ADLT (ELECTROSURGICAL) ×3
ELECTRODE REM PT RTRN 9FT ADLT (ELECTROSURGICAL) ×2 IMPLANT
GAUZE 4X4 16PLY ~~LOC~~+RFID DBL (SPONGE) ×3 IMPLANT
GLOVE ORTHO TXT STRL SZ7.5 (GLOVE) ×6 IMPLANT
GOWN STRL REUS W/ TWL LRG LVL3 (GOWN DISPOSABLE) ×4 IMPLANT
GOWN STRL REUS W/ TWL XL LVL3 (GOWN DISPOSABLE) ×4 IMPLANT
GOWN STRL REUS W/TWL LRG LVL3 (GOWN DISPOSABLE) ×6
GOWN STRL REUS W/TWL XL LVL3 (GOWN DISPOSABLE) ×6
GRASPER SUT TROCAR 14GX15 (MISCELLANEOUS) IMPLANT
IRRIGATION STRYKERFLOW (MISCELLANEOUS) IMPLANT
IRRIGATOR STRYKERFLOW (MISCELLANEOUS) ×3
IRRIGATOR SUCT 8 DISP DVNC XI (IRRIGATION / IRRIGATOR) IMPLANT
IRRIGATOR SUCTION 8MM XI DISP (IRRIGATION / IRRIGATOR)
IV NS IRRIG 3000ML ARTHROMATIC (IV SOLUTION) ×1 IMPLANT
KIT PINK PAD W/HEAD ARE REST (MISCELLANEOUS) ×3
KIT PINK PAD W/HEAD ARM REST (MISCELLANEOUS) ×2 IMPLANT
KIT TURNOVER KIT A (KITS) ×3 IMPLANT
LABEL OR SOLS (LABEL) ×3 IMPLANT
MANIFOLD NEPTUNE II (INSTRUMENTS) ×3 IMPLANT
NDL INSUFFLATION 14GA 120MM (NEEDLE) IMPLANT
NEEDLE HYPO 22GX1.5 SAFETY (NEEDLE) ×3 IMPLANT
NEEDLE INSUFFLATION 14GA 120MM (NEEDLE) IMPLANT
NS IRRIG 500ML POUR BTL (IV SOLUTION) ×3 IMPLANT
PACK BASIN MINOR ARMC (MISCELLANEOUS) ×3 IMPLANT
PACK LAP CHOLECYSTECTOMY (MISCELLANEOUS) ×3 IMPLANT
PENCIL ELECTRO HAND CTR (MISCELLANEOUS) ×3 IMPLANT
SEAL CANN UNIV 5-8 DVNC XI (MISCELLANEOUS) ×8 IMPLANT
SEAL XI 5MM-8MM UNIVERSAL (MISCELLANEOUS) ×12
SET TUBE SMOKE EVAC HIGH FLOW (TUBING) ×3 IMPLANT
SOLUTION ELECTROLUBE (MISCELLANEOUS) ×3 IMPLANT
SUT ETHIBOND 0 MO6 C/R (SUTURE) ×3 IMPLANT
SUT MNCRL 4-0 (SUTURE) ×3
SUT MNCRL 4-0 27XMFL (SUTURE) ×2
SUT VIC AB 3-0 SH 27 (SUTURE) ×3
SUT VIC AB 3-0 SH 27X BRD (SUTURE) ×2 IMPLANT
SUT VICRYL 0 AB UR-6 (SUTURE) ×3 IMPLANT
SUTURE MNCRL 4-0 27XMF (SUTURE) ×2 IMPLANT
SYR 10ML LL (SYRINGE) ×3 IMPLANT
SYS BAG RETRIEVAL 10MM (BASKET) ×3
SYSTEM BAG RETRIEVAL 10MM (BASKET) ×2 IMPLANT
TROCAR Z-THREAD FIOS 11X100 BL (TROCAR) IMPLANT
WATER STERILE IRR 500ML POUR (IV SOLUTION) ×3 IMPLANT

## 2022-01-05 NOTE — Transfer of Care (Signed)
Immediate Anesthesia Transfer of Care Note  Patient: Joseph [name].  Procedure(s) Performed: XI ROBOTIC ASSISTED LAPAROSCOPIC CHOLECYSTECTOMY (Abdomen) INDOCYANINE GREEN FLUORESCENCE IMAGING (ICG) HERNIA REPAIR UMBILICAL ADULT (Abdomen)  Patient Location: PACU  Anesthesia Type:General  Level of Consciousness: awake, drowsy and patient cooperative  Airway & Oxygen Therapy: Patient Spontanous Breathing and Patient connected to face mask oxygen  Post-op Assessment: Report given to RN and Post -op Vital signs reviewed and stable  Post vital signs: Reviewed and stable  Last Vitals:  Vitals Value Taken Time  BP 130/75 01/05/22 1322  Temp    Pulse 82 01/05/22 1327  Resp 19 01/05/22 1327  SpO2 95 % 01/05/22 1327  Vitals shown include unvalidated device data.  Last Pain:  Vitals:   01/05/22 1031  TempSrc: Temporal  PainSc: 0-No pain         Complications: No notable events documented.

## 2022-01-05 NOTE — Op Note (Signed)
Robotic cholecystectomy with Indocyamine Green Ductal Imaging.  Anterior abdominal wall hernia repair, initial, reducible 4 cm fascial defect.  Pre-operative Diagnosis: Chronic cholecystitis, umbilical hernia, fascial defect, less than 3 cm  Post-operative Diagnosis: Subacute/acute on chronic cholecystitis, total ventral hernia repair size 4 cm.  (Combination of two 2 cm defect separated by 1 cm fascial bridge.  Umbilical and supraumbilical fascial defects with diastases recti.)  Procedure: Robotic assisted laparoscopic cholecystectomy with Indocyamine Green Ductal Imaging.   Surgeon: Ronny Bacon, M.D., FACS  Anesthesia: General. with endotracheal tube  Findings: See photo marked fatty adhesions to the gallbladder and adjacent liver.  Initial 2 cm umbilical defect was utilized for trocar placement, however at time of closure we discovered there is a 2 cm supraumbilical defect of the linea alba approximately a centimeter away from the umbilical defect.  Thus these 2 defects were consolidated as of 1, and the hernia repair was completed by closing the midline, reapproximating the right and left recti in the supraumbilical region.  Diastases remains more cephalad.  Estimated Blood Loss: 15 mL         Drains: None         Specimens: Gallbladder           Complications: none  Procedure Details  The patient was seen again in the Holding Room.  2.5 mg dose of ICG was administered intravenously.   The benefits, complications, treatment options, risks and expected outcomes were again reviewed with the patient. The likelihood of improving the patient's symptoms with return to their baseline status is good.  The patient and/or family concurred with the proposed plan, giving informed consent, again alternatives reviewed.  The patient was taken to Operating Room, identified, and the procedure verified as robotic assisted laparoscopic cholecystectomy.  Prior to the induction of general anesthesia,  antibiotic prophylaxis was administered. VTE prophylaxis was in place. General endotracheal anesthesia was then administered and tolerated well. The patient was positioned in the supine position.  After the induction, the abdomen was prepped with Chloraprep and draped in the sterile fashion.  A Time Out was held and the above information confirmed.   Umbilical local infiltration with quarter percent Marcaine with epinephrine is utilized.  Made a 12 mm incision cephalad to the umbilicus, I advanced a a robotic 8 mm port blunt tipped under direct visualization into the peritoneal cavity via the umbilical hernia sac.  Once the peritoneum was penetrated, insufflation was initiated.  The trocar was then advanced into the abdominal cavity under direct visualization. Pneumoperitoneum was then continued utilizing CO2 at 15 mmHg or less and tolerated well without any adverse changes in the patient's vital signs.  Two 8.5-mm ports were placed in the left lower quadrant and laterally, and one to the right lower quadrant, all under direct vision. All skin incisions  were infiltrated with a local anesthetic agent before making the incision and placing the trocars.  The patient was positioned  in reverse Trendelenburg, tilted the patient's left side down.  Da Vinci XI robot was then positioned on to the patient's left side, and docked. See photo for the extensive adhesions obscuring direct visualization of the gallbladder fundus.  These adhesions were first mobilized through blunt dissection. The gallbladder was identified, the fundus grasped via the arm 4 Prograsp and retracted cephalad. Adhesions were lysed with scissors and cautery.  The infundibulum was identified grasped and retracted laterally, exposing the peritoneum overlying the triangle of Calot. This was then opened and dissected using cautery & scissors.  An extended critical view of the cystic duct and cystic artery was obtained, aided by the ICG via FireFly  which enabled ready visualization of the ductal anatomy.    The cystic duct was clearly identified and dissected to isolation.   Artery well isolated and sealed with bipolar cautery, and the cystic duct was triple clipped and divided with scissors, as close to the gallbladder neck as feasible, thus leaving two on the remaining stump.  The artery is sealed with bipolar and divided with monopolar scissors.   The gallbladder was taken from the gallbladder fossa in a retrograde fashion with the electrocautery. The gallbladder was removed and placed in an Endocatch bag.  The liver bed is inspected. Hemostasis was confirmed.  The robot was undocked and moved away from the operative field. Normal saline, greater than 2 L irrigation was utilized.  and was aspirated clear.  The gallbladder and Endocatch sac were then removed through the umbilical port site.   Inspection of the right upper quadrant was performed. No bleeding, bile duct injury or leak, or bowel injury was noted.  Desufflated the abdomen to begin repair of the umbilical fascial defect, and upon clearing the adjacent soft tissues from the fascia to define the fascial perimeter identified that there was a 1 cm fascial bridge cephalad and a second supraumbilical defect present.  I then divided the fascial bridge in the midline to allow adequate reapproximation of the recti in the midline.  I felt this was the best repair in order to avoid utilization of mesh and considering the patient's diastases recti at the inherent weakness and a horizontally oriented closure.  We then reapproximated the midline fascia with interrupted sutures of 0 Ethibond.  Completed with no tension. Pneumoperitoneum was restored, I evaluated the repair from within, and then released the pneumoperitoneum and ports removed.  3-0 Vicryl was utilized to close the subcutaneous tissues at the ventral hernia repair site.  4-0 subcuticular Monocryl was used to close the incisions.  Dermabond was  applied.  The patient was then extubated and brought to the recovery room in stable condition. Sponge, lap, and needle counts were correct at closure and at the conclusion of the case.               Ronny Bacon, M.D., [facility] 01/05/2022 1:13 PM

## 2022-01-05 NOTE — Anesthesia Procedure Notes (Signed)
Procedure Name: Intubation Date/Time: 01/05/2022 10:57 AM  Performed by: Kelton Pillar, CRNAPre-anesthesia Checklist: Patient identified, Emergency Drugs available, Suction available and Patient being monitored Patient Re-evaluated:Patient Re-evaluated prior to induction Oxygen Delivery Method: Circle system utilized Preoxygenation: Pre-oxygenation with 100% oxygen Induction Type: IV induction Ventilation: Mask ventilation without difficulty Laryngoscope Size: McGraph and 4 Grade View: Grade I Tube type: Oral Tube size: 7.5 mm Number of attempts: 1 Airway Equipment and Method: Stylet and Oral airway Placement Confirmation: ETT inserted through vocal cords under direct vision, positive ETCO2, breath sounds checked- equal and bilateral and CO2 detector Secured at: 21 cm Tube secured with: Tape Dental Injury: Teeth and Oropharynx as per pre-operative assessment

## 2022-01-05 NOTE — Progress Notes (Signed)
Patient awake/alert x4.  Patient toleratd x2 can's soda while in pacu and x10 packs of crackers without event.  Of note patient chronic use of oxycodone takes '20mg'$  at home.   Reviewed procedure and plan of care with patient, verbalizes understanding.

## 2022-01-05 NOTE — Discharge Instructions (Signed)
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
## 2022-01-05 NOTE — Anesthesia Postprocedure Evaluation (Signed)
Anesthesia Post Note  Patient: Joseph [name].  Procedure(s) Performed: XI ROBOTIC ASSISTED LAPAROSCOPIC CHOLECYSTECTOMY (Abdomen) INDOCYANINE GREEN FLUORESCENCE IMAGING (ICG) HERNIA REPAIR UMBILICAL ADULT (Abdomen)  Patient location during evaluation: PACU Anesthesia Type: General Level of consciousness: awake and alert Pain management: pain level controlled Vital Signs Assessment: post-procedure vital signs reviewed and stable Respiratory status: spontaneous breathing, nonlabored ventilation, respiratory function stable and patient connected to nasal cannula oxygen Cardiovascular status: blood pressure returned to baseline and stable Postop Assessment: no apparent nausea or vomiting Anesthetic complications: no   No notable events documented.   Last Vitals:  Vitals:   01/05/22 1349 01/05/22 1411  BP: 132/77 (!) 150/87  Pulse: 84 78  Resp: (!) 21 16  Temp: 36.4 C 36.7 C  SpO2: 95% 91%    Last Pain:  Vitals:   01/05/22 1411  TempSrc: Temporal  PainSc: 6                  Precious Haws Derik Fults

## 2022-01-06 ENCOUNTER — Encounter: Payer: Self-pay | Admitting: Surgery

## 2022-01-06 LAB — POCT I-STAT, CHEM 8
BUN: 15 mg/dL (ref 6–20)
Calcium, Ion: 1.15 mmol/L (ref 1.15–1.40)
Chloride: 100 mmol/L (ref 98–111)
Creatinine, Ser: 0.9 mg/dL (ref 0.61–1.24)
Glucose, Bld: 103 mg/dL — ABNORMAL HIGH (ref 70–99)
HCT: 43 % (ref 39.0–52.0)
Hemoglobin: 14.6 g/dL (ref 13.0–17.0)
Potassium: 3.8 mmol/L (ref 3.5–5.1)
Sodium: 142 mmol/L (ref 135–145)
TCO2: 31 mmol/L (ref 22–32)

## 2022-01-06 LAB — SURGICAL PATHOLOGY

## 2022-01-06 NOTE — Interval H&P Note (Signed)
History and Physical Interval Note:  01/06/2022 7:33 PM  Joseph [name].  has presented today for surgery, with the diagnosis of Cholecystits, umbilical  hernia less 3 cm.  The various methods of treatment have been discussed with the patient and family. After consideration of risks, benefits and other options for treatment, the patient has consented to  Procedure(s): XI ROBOTIC ASSISTED LAPAROSCOPIC CHOLECYSTECTOMY (N/A) INDOCYANINE GREEN FLUORESCENCE IMAGING (ICG) (N/A) HERNIA REPAIR UMBILICAL ADULT (N/A) as a surgical intervention.  The patient's history has been reviewed, patient examined, no change in status, stable for surgery.  I have reviewed the patient's chart and labs.  Questions were answered to the patient's satisfaction.     Ronny Bacon

## 2022-01-13 ENCOUNTER — Ambulatory Visit: Admit: 2022-01-13 | Payer: Medicare Other | Admitting: General Surgery

## 2022-01-13 SURGERY — LAPAROSCOPIC CHOLECYSTECTOMY WITH INTRAOPERATIVE CHOLANGIOGRAM
Anesthesia: General

## 2022-01-20 ENCOUNTER — Encounter: Payer: Medicare Other | Admitting: Physician Assistant

## 2022-01-25 ENCOUNTER — Encounter: Payer: Self-pay | Admitting: Physician Assistant

## 2022-01-25 ENCOUNTER — Ambulatory Visit (INDEPENDENT_AMBULATORY_CARE_PROVIDER_SITE_OTHER): Payer: Medicare Other | Admitting: Physician Assistant

## 2022-01-25 VITALS — BP 113/74 | HR 76 | Temp 98.3°F | Ht 72.0 in | Wt 269.0 lb

## 2022-01-25 DIAGNOSIS — N202 Calculus of kidney with calculus of ureter: Secondary | ICD-10-CM | POA: Diagnosis not present

## 2022-01-25 DIAGNOSIS — K8012 Calculus of gallbladder with acute and chronic cholecystitis without obstruction: Secondary | ICD-10-CM

## 2022-01-25 DIAGNOSIS — K8010 Calculus of gallbladder with chronic cholecystitis without obstruction: Secondary | ICD-10-CM

## 2022-01-25 DIAGNOSIS — Z09 Encounter for follow-up examination after completed treatment for conditions other than malignant neoplasm: Secondary | ICD-10-CM

## 2022-01-25 NOTE — Progress Notes (Signed)
[facility] POST-OP OFFICE VISIT  01/25/2022  HPI: Joseph [name]. is a 56 y.o. male 20 days s/p robotic assisted laparoscopic cholecystectomy for [facility] with concomitant umbilical hernia repair (4 cm) with Dr Christian Mate   He is doing very well He reports the day he got home he "felt 100x better." No abdominal pain at this point No fever, chills, nausea, emesis He is tolerating PO without any bowel issues Incisions are healing well  Vital signs: BP 113/74   Pulse 76   Temp 98.3 F (36.8 C)   Ht 6' (1.829 m)   Wt 269 lb (122 kg)   SpO2 97%   BMI 36.48 kg/m    Physical Exam: Constitutional: Well appearing male, NAD Abdomen: Soft, non-tender, non-distended, no rebound/guarding Skin: Laparoscopic incisions are healing well, no erythema or drainage   Assessment/Plan: This is a 56 y.o. male 20 days s/p robotic assisted laparoscopic cholecystectomy for [facility] with concomitant umbilical hernia repair (4 cm) with Dr Christian Mate    - Pain control prn  - Reviewed wound care recommendation  - Reviewed lifting restrictions; 6 weeks total  - Reviewed surgical pathology; [place]  - He can follow up on as needed basis; He understands to call with questions/concerns  -- Edison Simon, PA-C [facility] 01/25/2022, 3:43 PM M-F: 7am - 4pm

## 2022-01-25 NOTE — Patient Instructions (Addendum)
GENERAL POST-OPERATIVE PATIENT INSTRUCTIONS   WOUND CARE INSTRUCTIONS:  If the wound becomes bright red and painful or starts to drain infected material that is not clear, please contact your physician immediately.  If the wound is mildly pink and has a thick firm ridge underneath it, this is normal, and is referred to as a healing ridge.  This will resolve over the next 4-6 weeks.  BATHING: You may shower if you have been informed of this by your surgeon. However, Please do not submerge in a tub, hot tub, or pool until incisions are completely sealed or have been told by your surgeon that you may do so.  DIET:  You may eat any foods that you can tolerate.  It is a good idea to eat a high fiber diet and take in plenty of fluids to prevent constipation.  If you do become constipated you may want to take a mild laxative or take ducolax tablets on a daily basis until your bowel habits are regular.  Constipation can be very uncomfortable, along with straining, after recent surgery.  ACTIVITY:   You should not lift more than 20 pounds for 6 weeks total after surgery as it could put you at increased risk for complications. Twenty pounds is roughly equivalent to a plastic bag of groceries. At that time- Listen to your body when lifting, if you have pain when lifting, stop and then try again in a few days. Soreness after doing exercises or activities of daily living is normal as you get back in to your normal routine.  MEDICATIONS:  Try to take narcotic medications and anti-inflammatory medications, such as tylenol, ibuprofen, naprosyn, etc., with food.  This will minimize stomach upset from the medication.  Should you develop nausea and vomiting from the pain medication, or develop a rash, please discontinue the medication and contact your physician.  You should not drive, make important decisions, or operate machinery when taking narcotic pain medication.  SUNBLOCK Use sun block to incision area over the  next year if this area will be exposed to sun. This helps decrease scarring and will allow you avoid a permanent darkened area over your incision.  QUESTIONS:  Please feel free to call our office if you have any questions, and we will be glad to assist you. [phone]

## 2022-01-26 ENCOUNTER — Ambulatory Visit: Payer: Medicare Other | Admitting: Cardiovascular Disease

## 2022-02-07 ENCOUNTER — Ambulatory Visit: Payer: Medicare Other | Admitting: Cardiovascular Disease
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
## 2022-02-15 ENCOUNTER — Encounter: Payer: Self-pay | Admitting: Nurse Practitioner

## 2022-02-15 ENCOUNTER — Ambulatory Visit: Payer: Medicare Other | Attending: Cardiovascular Disease | Admitting: Nurse Practitioner

## 2022-02-15 VITALS — BP 126/80 | HR 74 | Ht 72.0 in | Wt 269.4 lb

## 2022-02-15 DIAGNOSIS — E785 Hyperlipidemia, unspecified: Secondary | ICD-10-CM | POA: Diagnosis not present

## 2022-02-15 DIAGNOSIS — G8921 Chronic pain due to trauma: Secondary | ICD-10-CM | POA: Diagnosis not present

## 2022-02-15 DIAGNOSIS — R0609 Other forms of dyspnea: Secondary | ICD-10-CM

## 2022-02-15 DIAGNOSIS — Z7189 Other specified counseling: Secondary | ICD-10-CM | POA: Diagnosis not present

## 2022-02-15 DIAGNOSIS — I2510 Atherosclerotic heart disease of native coronary artery without angina pectoris: Secondary | ICD-10-CM | POA: Diagnosis not present

## 2022-02-15 MED ORDER — ISOSORBIDE MONONITRATE ER 30 MG PO TB24: 30.0000 mg | ORAL_TABLET | Freq: Every day | ORAL | 11 refills | Status: DC

## 2022-02-15 NOTE — Patient Instructions (Signed)
Medication Instructions:   START Imdur one (1) tablet by mouth ( 30 mg) daily.   *If you need a refill on your cardiac medications before your next appointment, please call your pharmacy*   Lab Work:  None ordered.  If you have labs (blood work) drawn today and your tests are completely normal, you will receive your results only by: [place] (if you have MyChart) OR A paper copy in the mail If you have any lab test that is abnormal or we need to change your treatment, we will call you to review the results.   Testing/Procedures:  None ordered.   Follow-Up: At [facility], you and your health needs are our priority.  As part of our continuing mission to provide you with exceptional heart care, we have created designated Provider Care Teams.  These Care Teams include your primary Cardiologist (physician) and Advanced Practice Providers (APPs -  Physician Assistants and Nurse Practitioners) who all work together to provide you with the care you need, when you need it.  We recommend signing up for the patient portal called "MyChart".  Sign up information is provided on this After Visit Summary.  MyChart is used to connect with patients for Virtual Visits (Telemedicine).  Patients are able to view lab/test results, encounter notes, upcoming appointments, etc.  Non-urgent messages can be sent to your provider as well.   To learn more about what you can do with MyChart, go to [email or web address].    Your next appointment:   6 month(s)  The format for your next appointment:   In Person  Provider:   None     Important Information About Sugar

## 2022-03-30 DIAGNOSIS — E7849 Other hyperlipidemia: Secondary | ICD-10-CM | POA: Diagnosis not present

## 2022-03-30 DIAGNOSIS — D696 Thrombocytopenia, unspecified: Secondary | ICD-10-CM | POA: Diagnosis not present

## 2022-03-30 DIAGNOSIS — R5383 Other fatigue: Secondary | ICD-10-CM | POA: Diagnosis not present

## 2022-03-30 DIAGNOSIS — E039 Hypothyroidism, unspecified: Secondary | ICD-10-CM | POA: Diagnosis not present

## 2022-03-30 DIAGNOSIS — E1122 Type 2 diabetes mellitus with diabetic chronic kidney disease: Secondary | ICD-10-CM | POA: Diagnosis not present

## 2022-04-06 DIAGNOSIS — I25111 Atherosclerotic heart disease of native coronary artery with angina pectoris with documented spasm: Secondary | ICD-10-CM | POA: Diagnosis not present

## 2022-04-06 DIAGNOSIS — K760 Fatty (change of) liver, not elsewhere classified: Secondary | ICD-10-CM | POA: Diagnosis not present

## 2022-04-06 DIAGNOSIS — Z79891 Long term (current) use of opiate analgesic: Secondary | ICD-10-CM | POA: Diagnosis not present

## 2022-04-06 DIAGNOSIS — Z23 Encounter for immunization: Secondary | ICD-10-CM | POA: Diagnosis not present

## 2022-04-06 DIAGNOSIS — I10 Essential (primary) hypertension: Secondary | ICD-10-CM | POA: Diagnosis not present

## 2022-04-06 DIAGNOSIS — K7581 Nonalcoholic steatohepatitis (NASH): Secondary | ICD-10-CM | POA: Diagnosis not present

## 2022-04-06 DIAGNOSIS — E7849 Other hyperlipidemia: Secondary | ICD-10-CM | POA: Diagnosis not present

## 2022-04-06 DIAGNOSIS — E039 Hypothyroidism, unspecified: Secondary | ICD-10-CM | POA: Diagnosis not present

## 2022-04-06 DIAGNOSIS — Z91018 Allergy to other foods: Secondary | ICD-10-CM | POA: Diagnosis not present

## 2022-04-06 DIAGNOSIS — R7301 Impaired fasting glucose: Secondary | ICD-10-CM | POA: Diagnosis not present

## 2022-05-25 DIAGNOSIS — M25571 Pain in right ankle and joints of right foot: Secondary | ICD-10-CM | POA: Diagnosis not present

## 2022-06-06 DIAGNOSIS — R059 Cough, unspecified: Secondary | ICD-10-CM | POA: Diagnosis not present

## 2022-06-06 DIAGNOSIS — J329 Chronic sinusitis, unspecified: Secondary | ICD-10-CM | POA: Diagnosis not present

## 2022-06-06 DIAGNOSIS — R030 Elevated blood-pressure reading, without diagnosis of hypertension: Secondary | ICD-10-CM | POA: Diagnosis not present

## 2022-06-10 DIAGNOSIS — M9901 Segmental and somatic dysfunction of cervical region: Secondary | ICD-10-CM | POA: Diagnosis not present

## 2022-06-10 DIAGNOSIS — S338XXA Sprain of other parts of lumbar spine and pelvis, initial encounter: Secondary | ICD-10-CM | POA: Diagnosis not present

## 2022-06-10 DIAGNOSIS — M9902 Segmental and somatic dysfunction of thoracic region: Secondary | ICD-10-CM | POA: Diagnosis not present

## 2022-06-10 DIAGNOSIS — S233XXA Sprain of ligaments of thoracic spine, initial encounter: Secondary | ICD-10-CM | POA: Diagnosis not present

## 2022-06-10 DIAGNOSIS — M9904 Segmental and somatic dysfunction of sacral region: Secondary | ICD-10-CM | POA: Diagnosis not present

## 2022-06-10 DIAGNOSIS — M9903 Segmental and somatic dysfunction of lumbar region: Secondary | ICD-10-CM | POA: Diagnosis not present

## 2022-06-10 DIAGNOSIS — M47812 Spondylosis without myelopathy or radiculopathy, cervical region: Secondary | ICD-10-CM | POA: Diagnosis not present

## 2022-06-21 DIAGNOSIS — M9902 Segmental and somatic dysfunction of thoracic region: Secondary | ICD-10-CM | POA: Diagnosis not present

## 2022-06-21 DIAGNOSIS — M9904 Segmental and somatic dysfunction of sacral region: Secondary | ICD-10-CM | POA: Diagnosis not present

## 2022-06-21 DIAGNOSIS — M47812 Spondylosis without myelopathy or radiculopathy, cervical region: Secondary | ICD-10-CM | POA: Diagnosis not present

## 2022-06-21 DIAGNOSIS — S338XXA Sprain of other parts of lumbar spine and pelvis, initial encounter: Secondary | ICD-10-CM | POA: Diagnosis not present

## 2022-06-21 DIAGNOSIS — M9903 Segmental and somatic dysfunction of lumbar region: Secondary | ICD-10-CM | POA: Diagnosis not present

## 2022-06-21 DIAGNOSIS — S233XXA Sprain of ligaments of thoracic spine, initial encounter: Secondary | ICD-10-CM | POA: Diagnosis not present

## 2022-06-21 DIAGNOSIS — M9901 Segmental and somatic dysfunction of cervical region: Secondary | ICD-10-CM | POA: Diagnosis not present

## 2022-06-24 ENCOUNTER — Telehealth: Payer: Self-pay | Admitting: Cardiovascular Disease

## 2022-06-24 NOTE — Telephone Encounter (Signed)
Call transferred into triage. Pt was sick in Dec, bacterial infection/bronchitis - took Zithromax and then Ceftin - had dizziness and nausea during this time. No fever.  No coughing.  Now reporting chest discomfort -and SOB  that he has had for the past 2 months, probably before he got sick.  No VS available.  He was started on IMDUR at last ov 02/15/22.  Took him a few weeks to get over headaches and thinks it helped symptoms for a short while.  Has scheduled f/u with Dr. Burt Knack 08/08/22.  Requesting sooner.  Scheduled w APP next week.  Also adv to follow up with PCP as this possibly could be lingering respiratory infection.

## 2022-06-24 NOTE — Telephone Encounter (Signed)
Pt c/o of Chest Pain: STAT if CP now or developed within 24 hours  1. Are you having CP right now? No   2. Are you experiencing any other symptoms (ex. SOB, nausea, vomiting, sweating)? Nausea, dizziness  3. How long have you been experiencing CP? Month   4. Is your CP continuous or coming and going? Coming and going  5. Have you taken Nitroglycerin? No  ?
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
## 2022-06-28 ENCOUNTER — Ambulatory Visit (HOSPITAL_BASED_OUTPATIENT_CLINIC_OR_DEPARTMENT_OTHER): Payer: Medicare Other

## 2022-06-28 ENCOUNTER — Ambulatory Visit: Payer: Medicare Other | Attending: Nurse Practitioner | Admitting: Nurse Practitioner

## 2022-06-28 ENCOUNTER — Encounter: Payer: Self-pay | Admitting: Nurse Practitioner

## 2022-06-28 VITALS — BP 138/70 | HR 65 | Ht 72.0 in | Wt 274.0 lb

## 2022-06-28 DIAGNOSIS — E785 Hyperlipidemia, unspecified: Secondary | ICD-10-CM | POA: Diagnosis not present

## 2022-06-28 DIAGNOSIS — I2510 Atherosclerotic heart disease of native coronary artery without angina pectoris: Secondary | ICD-10-CM

## 2022-06-28 DIAGNOSIS — I3139 Other pericardial effusion (noninflammatory): Secondary | ICD-10-CM

## 2022-06-28 DIAGNOSIS — I10 Essential (primary) hypertension: Secondary | ICD-10-CM | POA: Diagnosis not present

## 2022-06-28 DIAGNOSIS — R0609 Other forms of dyspnea: Secondary | ICD-10-CM

## 2022-06-28 DIAGNOSIS — I301 Infective pericarditis: Secondary | ICD-10-CM | POA: Diagnosis not present

## 2022-06-28 DIAGNOSIS — G8921 Chronic pain due to trauma: Secondary | ICD-10-CM | POA: Insufficient documentation

## 2022-06-28 LAB — ECHOCARDIOGRAM COMPLETE
AV Mean grad: 14 mmHg
AV Peak grad: 21.3 mmHg
Ao pk vel: 2.31 m/s
Area-P 1/2: 3.56 cm2
Height: 72 in
S' Lateral: 3 cm
Weight: 4384 oz

## 2022-06-28 MED ORDER — COLCHICINE 0.6 MG PO TABS: 0.6000 mg | ORAL_TABLET | Freq: Two times a day (BID) | ORAL | 0 refills | Status: DC

## 2022-06-28 NOTE — Patient Instructions (Signed)
Medication Instructions:   START Colchicine one (1) tablet by mouth ( 0.6 mg) twice daily for 30 days than discontinue.   *If you need a refill on your cardiac medications before your next appointment, please call your pharmacy*   Lab Work:  TODAY!!!! CMET/LIPID/SED RATE/CRP  If you have labs (blood work) drawn today and your tests are completely normal, you will receive your results only by: [place] (if you have MyChart) OR A paper copy in the mail If you have any lab test that is abnormal or we need to change your treatment, we will call you to review the results.   Testing/Procedures:  Your physician has requested that you have an echocardiogram. Echocardiography is a painless test that uses sound waves to create images of your heart. It provides your doctor with information about the size and shape of your heart and how well your heart's chambers and valves are working. This procedure takes approximately one hour. There are no restrictions for this procedure. Please do NOT wear cologne, aftershave, or lotions (deodorant is allowed). Please arrive 15 minutes prior to your appointment time.    Follow-Up: At [facility], you and your health needs are our priority.  As part of our continuing mission to provide you with exceptional heart care, we have created designated Provider Care Teams.  These Care Teams include your primary Cardiologist (physician) and Advanced Practice Providers (APPs -  Physician Assistants and Nurse Practitioners) who all work together to provide you with the care you need, when you need it.  We recommend signing up for the patient portal called "MyChart".  Sign up information is provided on this After Visit Summary.  MyChart is used to connect with patients for Virtual Visits (Telemedicine).  Patients are able to view lab/test results, encounter notes, upcoming appointments, etc.  Non-urgent messages can be sent to your provider as well.   To  learn more about what you can do with MyChart, go to [email or web address].    Your next appointment:   1 month(s)  The format for your next appointment:   In Person  Provider:   Sherren Mocha, MD     Other Instructions  Pericarditis  Pericarditis is inflammation of the pericardium. The pericardium is a thin, double-layered, fluid-filled sac that surrounds the heart. The pericardium protects and holds the heart in the chest cavity. Inflammation of the pericardium can cause rubbing (friction) between the two layers when the heart beats. Fluid may also build up between the layers of the sac (pericardial effusion). There are five types of this condition: Acute pericarditis. Inflammation develops suddenly and causes pericardial effusion. Subacute pericarditis. Symptoms develop over a couple of days without clear onset. Recurrent pericarditis. Symptoms return after an acute episode and a symptom-free interval of 4-6 weeks. Long-term (chronic) pericarditis. Inflammation may develop slowly, or it may continue after acute pericarditis and last longer than 3 months. Constrictive pericarditis. The layers of the pericardium stiffen and develop scar tissue. The scar tissue thickens and sticks together, making it difficult for the heart to work in a normal way. This type is rare. In most cases, pericarditis is acute and not serious. Chronic pericarditis and constrictive pericarditis are more serious and may require treatment. What are the causes? In most cases, the cause of this condition is not known. If a cause is found, it may be: An infection from a virus, fungus, or bacteria. Noninfectious diseases such as: Autoimmune disorder or inflammatory disorder. The body's defense system (immune system)  attacks healthy tissues. Examples include lupus or rheumatoid arthritis. A heart attack (myocardial infarction). Cancer that has spread (metastasized) to the pericardium from another part of the  body. Underactive thyroid gland (hypothyroidism). Kidney failure. Noninfectious causes such as: A chest injury. After open-heart surgery. Treatment using high-energy X-rays (radiation). Certain medicines, including some seizure medicines, blood thinners, heart medicines, and antibiotics. What increases the risk? The following factors may make you more likely to develop this condition: Being male. Being 79-22 years old. Having had pericarditis before. Having had a recent upper respiratory tract infection. What are the signs or symptoms? The main symptom of this condition is sharp chest pain. The chest pain may: Be in the center or the left side of your chest. Worsen when you lie down and get better when you sit up and lean forward. Worsen with deep breathing. Move to your back, neck, or shoulder. Other symptoms may include: A chronic, dry cough. Heart palpitations. These may feel like rapid, fluttering, or pounding heartbeats. Dizziness or fainting. Tiredness or fatigue. Muscle aches. Fever. Rapid breathing. Shortness of breath when lying down. How is this diagnosed? This condition is diagnosed based on your medical history, a physical exam, and diagnostic tests. During your physical exam, your health care provider will listen for friction while your heart beats (pericardial rub). You may also have tests, including: Imaging, such as: Electrocardiogram (ECG). This test measures the electrical activity in your heart. Echocardiogram. This test uses sound waves to make images of your heart. Chest X-ray. CT scan or MRI, or both. Blood tests: To check for infection and inflammation. To rule out a heart attack. Culture of pericardial fluid. Removing a tissue sample of the pericardium to look at under a microscope (biopsy). If tests show that you may have constrictive pericarditis, a small, thin tube may be inserted into your heart to confirm the diagnosis (cardiac  catheterization). How is this treated? Treatment for this condition depends on the cause and type of pericarditis that you have. In most cases, acute pericarditis will clear up on its own. Treatment may include: Limiting physical activity. Taking medicines such as: NSAIDs, such as ibuprofen, or aspirin to relieve pain and inflammation. Colchicine to relieve pain and inflammation. Steroids to reduce inflammation. An antimicrobial if pericarditis is due to an infection. An IL-1 blocker called anakinra if you have recurrent pericarditis. A procedure to remove fluid using a needle (pericardiocentesis) if fluid buildup puts pressure on the heart. Surgery to remove part of the pericardium if constrictive pericarditis develops. If another condition is causing your pericarditis, you may also need treatment for that condition. Follow these instructions at home: Medicines Take over-the-counter and prescription medicines only as told by your health care provider. If you were prescribed an antimicrobial medicine, take it as told by your health care provider. Do not stop using the antimicrobial even if you start to feel better. Activity Limit your activity as told by your health care provider until your symptoms improve. This usually includes: Resting or sitting for most of the day. No long walks. No exercise. No sports. Athletes may need to limit competition for several months. General instructions Do not use any products that contain nicotine or tobacco. These products include cigarettes, chewing tobacco, and vaping devices, such as e-cigarettes. If you need help quitting, ask your health care provider. Eat a heart-healthy diet. Ask your dietitian what foods are healthy for your heart. Keep all follow-up visits. This is important. Contact a health care provider if: You continue  to have symptoms of pericarditis. You develop new symptoms of pericarditis. Your symptoms get worse. You have a  fever. Get help right away if: You have worsening chest pain. You have difficulty breathing. You pass out. These symptoms may be an emergency. Get help right away. Call 911. Do not wait to see if the symptoms will go away. Do not drive yourself to the hospital. Summary Pericarditis is inflammation of the thin, double-layered, fluid-filled sac that surrounds your heart (pericardium). The main symptom of this condition is chest pain. Treatment for this condition depends on the cause and type of pericarditis that you have and includes limiting activity. Take all medicines only as told by your health care provider. Keep all follow-up visits. This information is not intended to replace advice given to you by your health care provider. Make sure you discuss any questions you have with your health care provider. Document Revised: 02/10/2021 Document Reviewed: 02/10/2021 Elsevier Patient Education  [place]

## 2022-06-29 ENCOUNTER — Other Ambulatory Visit: Payer: Self-pay | Admitting: Nurse Practitioner

## 2022-06-29 DIAGNOSIS — G4733 Obstructive sleep apnea (adult) (pediatric): Secondary | ICD-10-CM | POA: Diagnosis not present

## 2022-06-29 LAB — COMPREHENSIVE METABOLIC PANEL
ALT: 42 IU/L (ref 0–44)
AST: 31 IU/L (ref 0–40)
Albumin/Globulin Ratio: 1.9 (ref 1.2–2.2)
Albumin: 3.8 g/dL (ref 3.8–4.9)
Alkaline Phosphatase: 70 IU/L (ref 44–121)
BUN/Creatinine Ratio: 15 (ref 9–20)
BUN: 12 mg/dL (ref 6–24)
Bilirubin Total: 0.4 mg/dL (ref 0.0–1.2)
CO2: 28 mmol/L (ref 20–29)
Calcium: 9.2 mg/dL (ref 8.7–10.2)
Chloride: 101 mmol/L (ref 96–106)
Creatinine, Ser: 0.79 mg/dL (ref 0.76–1.27)
Globulin, Total: 2 g/dL (ref 1.5–4.5)
Glucose: 90 mg/dL (ref 70–99)
Potassium: 3.4 mmol/L — ABNORMAL LOW (ref 3.5–5.2)
Sodium: 145 mmol/L — ABNORMAL HIGH (ref 134–144)
Total Protein: 5.8 g/dL — ABNORMAL LOW (ref 6.0–8.5)
eGFR: 102 mL/min/{1.73_m2} (ref >59)

## 2022-06-29 LAB — C-REACTIVE PROTEIN: CRP: 1 mg/L (ref 0–10)

## 2022-06-29 LAB — LIPID PANEL
Chol/HDL Ratio: 4.2 ratio (ref 0.0–5.0)
Cholesterol, Total: 174 mg/dL (ref 100–199)
HDL: 41 mg/dL (ref >39)
LDL Chol Calc (NIH): 98 mg/dL (ref 0–99)
Triglycerides: 204 mg/dL — ABNORMAL HIGH (ref 0–149)
VLDL Cholesterol Cal: 35 mg/dL (ref 5–40)

## 2022-06-29 LAB — SEDIMENTATION RATE: Sed Rate: 6 mm/hr (ref 0–30)

## 2022-06-29 MED ORDER — METOPROLOL SUCCINATE ER 25 MG PO TB24: 25.0000 mg | ORAL_TABLET | Freq: Every day | ORAL | 3 refills | Status: DC

## 2022-07-05 DIAGNOSIS — S338XXA Sprain of other parts of lumbar spine and pelvis, initial encounter: Secondary | ICD-10-CM | POA: Diagnosis not present

## 2022-07-05 DIAGNOSIS — M47812 Spondylosis without myelopathy or radiculopathy, cervical region: Secondary | ICD-10-CM | POA: Diagnosis not present

## 2022-07-05 DIAGNOSIS — M9904 Segmental and somatic dysfunction of sacral region: Secondary | ICD-10-CM | POA: Diagnosis not present

## 2022-07-05 DIAGNOSIS — S233XXA Sprain of ligaments of thoracic spine, initial encounter: Secondary | ICD-10-CM | POA: Diagnosis not present

## 2022-07-05 DIAGNOSIS — M9902 Segmental and somatic dysfunction of thoracic region: Secondary | ICD-10-CM | POA: Diagnosis not present

## 2022-07-05 DIAGNOSIS — M9901 Segmental and somatic dysfunction of cervical region: Secondary | ICD-10-CM | POA: Diagnosis not present

## 2022-07-05 DIAGNOSIS — M9903 Segmental and somatic dysfunction of lumbar region: Secondary | ICD-10-CM | POA: Diagnosis not present

## 2022-07-06 DIAGNOSIS — R7989 Other specified abnormal findings of blood chemistry: Secondary | ICD-10-CM | POA: Diagnosis not present

## 2022-07-06 DIAGNOSIS — E1165 Type 2 diabetes mellitus with hyperglycemia: Secondary | ICD-10-CM | POA: Diagnosis not present

## 2022-07-06 DIAGNOSIS — E7849 Other hyperlipidemia: Secondary | ICD-10-CM | POA: Diagnosis not present

## 2022-07-06 DIAGNOSIS — N183 Chronic kidney disease, stage 3 unspecified: Secondary | ICD-10-CM | POA: Diagnosis not present

## 2022-07-06 DIAGNOSIS — Z1329 Encounter for screening for other suspected endocrine disorder: Secondary | ICD-10-CM | POA: Diagnosis not present

## 2022-07-06 DIAGNOSIS — E039 Hypothyroidism, unspecified: Secondary | ICD-10-CM | POA: Diagnosis not present

## 2022-07-06 DIAGNOSIS — E1122 Type 2 diabetes mellitus with diabetic chronic kidney disease: Secondary | ICD-10-CM | POA: Diagnosis not present

## 2022-07-11 DIAGNOSIS — N200 Calculus of kidney: Secondary | ICD-10-CM | POA: Diagnosis not present

## 2022-07-13 DIAGNOSIS — E7849 Other hyperlipidemia: Secondary | ICD-10-CM | POA: Diagnosis not present

## 2022-07-13 DIAGNOSIS — I255 Ischemic cardiomyopathy: Secondary | ICD-10-CM | POA: Diagnosis not present

## 2022-07-13 DIAGNOSIS — E039 Hypothyroidism, unspecified: Secondary | ICD-10-CM | POA: Diagnosis not present

## 2022-07-13 DIAGNOSIS — Z0001 Encounter for general adult medical examination with abnormal findings: Secondary | ICD-10-CM | POA: Diagnosis not present

## 2022-07-13 DIAGNOSIS — R4582 Worries: Secondary | ICD-10-CM | POA: Diagnosis not present

## 2022-07-13 DIAGNOSIS — R7301 Impaired fasting glucose: Secondary | ICD-10-CM | POA: Diagnosis not present

## 2022-07-13 DIAGNOSIS — K7581 Nonalcoholic steatohepatitis (NASH): Secondary | ICD-10-CM | POA: Diagnosis not present

## 2022-07-13 DIAGNOSIS — Z79891 Long term (current) use of opiate analgesic: Secondary | ICD-10-CM | POA: Diagnosis not present

## 2022-07-13 DIAGNOSIS — Z91018 Allergy to other foods: Secondary | ICD-10-CM | POA: Diagnosis not present

## 2022-07-13 DIAGNOSIS — I25111 Atherosclerotic heart disease of native coronary artery with angina pectoris with documented spasm: Secondary | ICD-10-CM | POA: Diagnosis not present

## 2022-07-13 DIAGNOSIS — K760 Fatty (change of) liver, not elsewhere classified: Secondary | ICD-10-CM | POA: Diagnosis not present

## 2022-07-13 DIAGNOSIS — I10 Essential (primary) hypertension: Secondary | ICD-10-CM | POA: Diagnosis not present

## 2022-07-14 ENCOUNTER — Ambulatory Visit: Payer: Medicare Other | Admitting: Urology

## 2022-07-14 ENCOUNTER — Encounter: Payer: Self-pay | Admitting: Urology

## 2022-07-14 VITALS — BP 103/67 | HR 82 | Ht 72.0 in | Wt 271.0 lb

## 2022-07-14 DIAGNOSIS — R109 Unspecified abdominal pain: Secondary | ICD-10-CM

## 2022-07-14 DIAGNOSIS — N200 Calculus of kidney: Secondary | ICD-10-CM

## 2022-07-14 DIAGNOSIS — R3129 Other microscopic hematuria: Secondary | ICD-10-CM

## 2022-07-14 DIAGNOSIS — R1084 Generalized abdominal pain: Secondary | ICD-10-CM

## 2022-07-14 LAB — URINALYSIS, COMPLETE
Bilirubin, UA: NEGATIVE
Glucose, UA: NEGATIVE
Ketones, UA: NEGATIVE
Leukocytes,UA: NEGATIVE
Nitrite, UA: NEGATIVE
Specific Gravity, UA: 1.02 (ref 1.005–1.030)
Urobilinogen, Ur: 0.2 mg/dL (ref 0.2–1.0)
pH, UA: 7 (ref 5.0–7.5)

## 2022-07-14 LAB — MICROSCOPIC EXAMINATION

## 2022-07-15 ENCOUNTER — Encounter: Payer: Self-pay | Admitting: Urology
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
## 2022-07-19 DIAGNOSIS — R059 Cough, unspecified: Secondary | ICD-10-CM | POA: Diagnosis not present

## 2022-07-19 DIAGNOSIS — B349 Viral infection, unspecified: Secondary | ICD-10-CM | POA: Diagnosis not present

## 2022-07-19 DIAGNOSIS — R030 Elevated blood-pressure reading, without diagnosis of hypertension: Secondary | ICD-10-CM | POA: Diagnosis not present

## 2022-07-22 DIAGNOSIS — G4733 Obstructive sleep apnea (adult) (pediatric): Secondary | ICD-10-CM | POA: Diagnosis not present

## 2022-07-26 ENCOUNTER — Encounter: Payer: Self-pay | Admitting: Cardiovascular Disease

## 2022-07-29 ENCOUNTER — Ambulatory Visit
Admission: RE | Admit: 2022-07-29 | Discharge: 2022-07-29 | Disposition: A | Discharge status: Home / Self Care | Payer: Medicare Other | Source: Ambulatory Visit | Attending: Urology | Admitting: Urology

## 2022-07-29 DIAGNOSIS — N201 Calculus of ureter: Secondary | ICD-10-CM | POA: Diagnosis not present

## 2022-07-29 DIAGNOSIS — K439 Ventral hernia without obstruction or gangrene: Secondary | ICD-10-CM | POA: Diagnosis not present

## 2022-07-29 DIAGNOSIS — R109 Unspecified abdominal pain: Secondary | ICD-10-CM | POA: Insufficient documentation

## 2022-07-29 DIAGNOSIS — K5730 Diverticulosis of large intestine without perforation or abscess without bleeding: Secondary | ICD-10-CM | POA: Diagnosis not present

## 2022-07-29 DIAGNOSIS — N200 Calculus of kidney: Secondary | ICD-10-CM | POA: Diagnosis not present

## 2022-07-31 ENCOUNTER — Encounter: Payer: Self-pay | Admitting: Urology

## 2022-08-02 ENCOUNTER — Encounter (HOSPITAL_BASED_OUTPATIENT_CLINIC_OR_DEPARTMENT_OTHER): Payer: Self-pay | Admitting: Pulmonary Disease

## 2022-08-02 ENCOUNTER — Ambulatory Visit (INDEPENDENT_AMBULATORY_CARE_PROVIDER_SITE_OTHER): Payer: Medicare Other | Admitting: Pulmonary Disease

## 2022-08-02 VITALS — BP 118/64 | HR 62 | Temp 97.7°F | Ht 72.0 in | Wt 278.0 lb

## 2022-08-02 DIAGNOSIS — G4733 Obstructive sleep apnea (adult) (pediatric): Secondary | ICD-10-CM | POA: Diagnosis not present
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
## 2022-08-02 NOTE — Patient Instructions (Signed)
We will check CPAP report from DME & tweak settings if needed

## 2022-08-02 NOTE — Assessment & Plan Note (Signed)
It is unclear whether his memory issues and fatigue are related to untreated OSA, nonrefreshing sleep due to increased nocturnal awakenings due to pain or due to recent cardiac issues/meds introduced for the same. We will obtain a download on his current machine and ensure that events are well-controlled.  We may also consider repeating nocturnal oximetry on current settings to ensure that there is no component of nocturnal hypoxia  Weight loss encouraged, compliance with goal of at least 4-6 hrs every night is the expectation. Advised against medications with sedative side effects Cautioned against driving when sleepy - understanding that sleepiness will vary on a day to day basis

## 2022-08-04 ENCOUNTER — Institutional Professional Consult (permissible substitution): Payer: Medicare Other | Admitting: Adult Health

## 2022-08-08 ENCOUNTER — Ambulatory Visit: Payer: Medicare Other | Attending: Cardiovascular Disease | Admitting: Cardiovascular Disease

## 2022-08-08 ENCOUNTER — Encounter: Payer: Self-pay | Admitting: Cardiovascular Disease

## 2022-08-08 ENCOUNTER — Other Ambulatory Visit: Payer: Self-pay | Admitting: *Deleted

## 2022-08-08 VITALS — BP 116/78 | HR 72 | Ht 72.0 in | Wt 271.6 lb

## 2022-08-08 DIAGNOSIS — I25119 Atherosclerotic heart disease of native coronary artery with unspecified angina pectoris: Secondary | ICD-10-CM

## 2022-08-08 DIAGNOSIS — N200 Calculus of kidney: Secondary | ICD-10-CM

## 2022-08-08 DIAGNOSIS — I421 Obstructive hypertrophic cardiomyopathy: Secondary | ICD-10-CM | POA: Diagnosis not present

## 2022-08-08 DIAGNOSIS — Z01810 Encounter for preprocedural cardiovascular examination: Secondary | ICD-10-CM | POA: Diagnosis not present

## 2022-08-08 LAB — CBC
Hematocrit: 46.1 % (ref 37.5–51.0)
Hemoglobin: 15.8 g/dL (ref 13.0–17.7)
MCH: 31.5 pg (ref 26.6–33.0)
MCHC: 34.3 g/dL (ref 31.5–35.7)
MCV: 92 fL (ref 79–97)
Platelets: 227 10*3/uL (ref 150–450)
RBC: 5.02 x10E6/uL (ref 4.14–5.80)
RDW: 15 % (ref 11.6–15.4)
WBC: 8.7 10*3/uL (ref 3.4–10.8)

## 2022-08-08 LAB — BASIC METABOLIC PANEL
BUN/Creatinine Ratio: 17 (ref 9–20)
BUN: 18 mg/dL (ref 6–24)
CO2: 32 mmol/L — ABNORMAL HIGH (ref 20–29)
Calcium: 10 mg/dL (ref 8.7–10.2)
Chloride: 100 mmol/L (ref 96–106)
Creatinine, Ser: 1.08 mg/dL (ref 0.76–1.27)
Glucose: 100 mg/dL — ABNORMAL HIGH (ref 70–99)
Potassium: 3.8 mmol/L (ref 3.5–5.2)
Sodium: 143 mmol/L (ref 134–144)
eGFR: 79 mL/min/{1.73_m2} (ref >59)

## 2022-08-08 MED ORDER — AMLODIPINE BESYLATE 5 MG PO TABS: 5.0000 mg | ORAL_TABLET | Freq: Every day | ORAL | 3 refills | Status: DC

## 2022-08-08 MED ORDER — LORAZEPAM 1 MG PO TABS: ORAL_TABLET | ORAL | 0 refills | Status: DC

## 2022-08-08 MED ORDER — METOPROLOL SUCCINATE ER 50 MG PO TB24: 50.0000 mg | ORAL_TABLET | Freq: Every day | ORAL | 3 refills | Status: DC

## 2022-08-08 NOTE — Patient Instructions (Signed)
Medication Instructions:  DECREASE Amlodipine to 58m daily INCREASE Metoprolol Succinate to 594mdaily *If you need a refill on your cardiac medications before your next appointment, please call your pharmacy*   Lab Work: CBC, BMET today If you have labs (blood work) drawn today and your tests are completely normal, you will receive your results only by: [facility] you have MyChart) OR A paper copy in the mail If you have any lab test that is abnormal or we need to change your treatment, we will call you to review the results.   Testing/Procedures: R & L Heart Catheterization Your physician has requested that you have a cardiac catheterization. Cardiac catheterization is used to diagnose and/or treat various heart conditions. Doctors may recommend this procedure for a number of different reasons. The most common reason is to evaluate chest pain. Chest pain can be a symptom of coronary artery disease (CAD), and cardiac catheterization can show whether plaque is narrowing or blocking your heart's arteries. This procedure is also used to evaluate the valves, as well as measure the blood flow and oxygen levels in different parts of your heart. For further information please visit [email or web address] follow instruction sheet, as given.  Cardiac MRI   Follow-Up: [facility].  As part of our continuing mission to provide you with exceptional heart care, we have created designated Provider Care Teams.  These Care Teams include your primary Cardiologist (physician) and Advanced Practice Providers (APPs -  Physician Assistants and Nurse Practitioners) who all work together to provide you with the care you need, when you need it.  Your next appointment:   3 month(s)  Provider:   ScRichardson DoppPA     Other Instructions       Cardiac/Peripheral Catheterization   You are scheduled for a Cardiac Catheterization on  Monday, February 26 with Dr. MiSherren Mocha 1. Please [facility] [id number] February 26 at 5:30 AM (This time is two hours before your procedure to ensure your preparation). Free valet parking service is available. You will check in at ADMITTING. The support person will be asked to wait in the waiting room.  It is OK to have someone drop you off and come back when you are ready to be discharged.        Special note: Every effort is made to have your procedure done on time. Please understand that emergencies sometimes delay scheduled procedures.   . 2. Diet: Do not eat solid foods after midnight.  You may have clear liquids until 5 AM the day of the procedure.  3. Labs: TODAY You do not need to be fasting.  4. Medication instructions in preparation for your procedure:   Contrast Allergy: No  DO NOT TAKE Chlorthalidone the morning of procedure  On the morning of your procedure, take Aspirin 81 mg and any morning medicines NOT listed above.  You may use sips of water.  5. Plan to go home the same day, you will only stay overnight if medically necessary. 6. You MUST have a responsible adult to drive you home. 7. An adult MUST be with you the first 24 hours after you arrive home. 8. Bring a current list of your medications, and the last time and date medication taken. 9. Bring ID and current insurance cards. 10.Please wear clothes that are easy to get on  and off and wear slip-on shoes.  Thank you for allowing [place] to care for you!   -- [facility]
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
## 2022-08-11 ENCOUNTER — Telehealth: Payer: Self-pay | Admitting: *Deleted

## 2022-08-11 NOTE — Telephone Encounter (Signed)
Cardiac Catheterization scheduled at [facility] for: Monday August 15, 2022 7:30 AM Arrival time and place: [place] Entrance A at: 5:30 AM  Nothing to eat after midnight prior to procedure, clear liquids until 5 AM day of procedure.  Medication instructions: -Hold:  Chlorthatlidone-AM of procedure -Other usual morning medications can be taken with sips of water including aspirin 81 mg.  Confirmed patient has responsible adult to drive home post procedure and be with patient first 24 hours after arriving home.  Patient reports no new symptoms concerning for COVID-19 in the past 10 days.  Reviewed procedure instructions with patient's wife (DPR).

## 2022-08-12 ENCOUNTER — Telehealth: Payer: Self-pay | Admitting: Pulmonary Disease

## 2022-08-12 ENCOUNTER — Encounter: Payer: Self-pay | Admitting: Cardiovascular Disease

## 2022-08-12 NOTE — Telephone Encounter (Signed)
Dr. Elsworth Soho saw the patient on 08/02/22.  Will route message to Dr. Elsworth Soho.

## 2022-08-12 NOTE — Telephone Encounter (Signed)
Please advise 

## 2022-08-12 NOTE — Telephone Encounter (Signed)
Patient called to request his cpap readings.  Stated the doctor said that he would having them a few days after his visit.  Please advise.  CB# [phone]

## 2022-08-15 ENCOUNTER — Ambulatory Visit (HOSPITAL_COMMUNITY)
Admission: RE | Admit: 2022-08-15 | Discharge: 2022-08-15 | Disposition: A | Discharge status: Home / Self Care | Payer: Medicare Other | Source: Ambulatory Visit | Attending: Cardiovascular Disease | Admitting: Cardiovascular Disease

## 2022-08-15 ENCOUNTER — Ambulatory Visit (HOSPITAL_COMMUNITY)
Admission: RE | Disposition: A | Discharge status: Home / Self Care | Payer: Self-pay | Source: Ambulatory Visit | Attending: Cardiovascular Disease

## 2022-08-15 ENCOUNTER — Other Ambulatory Visit: Payer: Self-pay

## 2022-08-15 DIAGNOSIS — I2584 Coronary atherosclerosis due to calcified coronary lesion: Secondary | ICD-10-CM | POA: Insufficient documentation

## 2022-08-15 DIAGNOSIS — Z955 Presence of coronary angioplasty implant and graft: Secondary | ICD-10-CM | POA: Insufficient documentation

## 2022-08-15 DIAGNOSIS — I25119 Atherosclerotic heart disease of native coronary artery with unspecified angina pectoris: Secondary | ICD-10-CM | POA: Diagnosis present

## 2022-08-15 HISTORY — PX: RIGHT/LEFT HEART CATH AND CORONARY ANGIOGRAPHY: CATH118266

## 2022-08-15 HISTORY — PX: INTRAVASCULAR PRESSURE WIRE/FFR STUDY: CATH118243

## 2022-08-15 LAB — POCT I-STAT EG7
Acid-Base Excess: 5 mmol/L — ABNORMAL HIGH (ref 0.0–2.0)
Acid-Base Excess: 5 mmol/L — ABNORMAL HIGH (ref 0.0–2.0)
Bicarbonate: 31.4 mmol/L — ABNORMAL HIGH (ref 20.0–28.0)
Bicarbonate: 31.8 mmol/L — ABNORMAL HIGH (ref 20.0–28.0)
Calcium, Ion: 1.2 mmol/L (ref 1.15–1.40)
Calcium, Ion: 1.22 mmol/L (ref 1.15–1.40)
HCT: 40 % (ref 39.0–52.0)
HCT: 40 % (ref 39.0–52.0)
Hemoglobin: 13.6 g/dL (ref 13.0–17.0)
Hemoglobin: 13.6 g/dL (ref 13.0–17.0)
O2 Saturation: 72 %
O2 Saturation: 74 %
Potassium: 3.4 mmol/L — ABNORMAL LOW (ref 3.5–5.1)
Potassium: 3.4 mmol/L — ABNORMAL LOW (ref 3.5–5.1)
Sodium: 142 mmol/L (ref 135–145)
Sodium: 142 mmol/L (ref 135–145)
TCO2: 33 mmol/L — ABNORMAL HIGH (ref 22–32)
TCO2: 33 mmol/L — ABNORMAL HIGH (ref 22–32)
pCO2, Ven: 55.1 mmHg (ref 44–60)
pCO2, Ven: 56.7 mmHg (ref 44–60)
pH, Ven: 7.356 (ref 7.25–7.43)
pH, Ven: 7.364 (ref 7.25–7.43)
pO2, Ven: 40 mmHg (ref 32–45)
pO2, Ven: 42 mmHg (ref 32–45)

## 2022-08-15 LAB — POCT I-STAT 7, (LYTES, BLD GAS, ICA,H+H)
Acid-Base Excess: 4 mmol/L — ABNORMAL HIGH (ref 0.0–2.0)
Bicarbonate: 29.5 mmol/L — ABNORMAL HIGH (ref 20.0–28.0)
Calcium, Ion: 1.21 mmol/L (ref 1.15–1.40)
HCT: 40 % (ref 39.0–52.0)
Hemoglobin: 13.6 g/dL (ref 13.0–17.0)
O2 Saturation: 96 %
Potassium: 3.4 mmol/L — ABNORMAL LOW (ref 3.5–5.1)
Sodium: 142 mmol/L (ref 135–145)
TCO2: 31 mmol/L (ref 22–32)
pCO2 arterial: 48.7 mmHg — ABNORMAL HIGH (ref 32–48)
pH, Arterial: 7.391 (ref 7.35–7.45)
pO2, Arterial: 82 mmHg — ABNORMAL LOW (ref 83–108)

## 2022-08-15 LAB — POCT ACTIVATED CLOTTING TIME: Activated Clotting Time: 223 seconds

## 2022-08-15 SURGERY — RIGHT/LEFT HEART CATH AND CORONARY ANGIOGRAPHY
Anesthesia: LOCAL

## 2022-08-15 MED ORDER — HEPARIN SODIUM (PORCINE) 1000 UNIT/ML IJ SOLN
INTRAMUSCULAR | Status: DC | PRN
  Administered 2022-08-15 (×2): 6000 [IU] via INTRAVENOUS

## 2022-08-15 MED ORDER — SODIUM CHLORIDE 0.9 % IV SOLN: INTRAVENOUS | Status: DC

## 2022-08-15 MED ORDER — ASPIRIN 81 MG PO CHEW: 81.0000 mg | CHEWABLE_TABLET | ORAL | Status: DC

## 2022-08-15 MED ORDER — HEPARIN SODIUM (PORCINE) 1000 UNIT/ML IJ SOLN
INTRAMUSCULAR | Status: AC
  Filled 2022-08-15: qty 10

## 2022-08-15 MED ORDER — SODIUM CHLORIDE 0.9 % IV SOLN: 250.0000 mL | INTRAVENOUS | Status: DC | PRN

## 2022-08-15 MED ORDER — LABETALOL HCL 5 MG/ML IV SOLN: 10.0000 mg | INTRAVENOUS | Status: DC | PRN

## 2022-08-15 MED ORDER — HYDRALAZINE HCL 20 MG/ML IJ SOLN: 10.0000 mg | INTRAMUSCULAR | Status: DC | PRN

## 2022-08-15 MED ORDER — ACETAMINOPHEN 325 MG PO TABS: 650.0000 mg | ORAL_TABLET | ORAL | Status: DC | PRN

## 2022-08-15 MED ORDER — MIDAZOLAM HCL 2 MG/2ML IJ SOLN
INTRAMUSCULAR | Status: AC
  Filled 2022-08-15: qty 2

## 2022-08-15 MED ORDER — VERAPAMIL HCL 2.5 MG/ML IV SOLN
INTRAVENOUS | Status: DC | PRN
  Administered 2022-08-15: 2 mg via INTRA_ARTERIAL

## 2022-08-15 MED ORDER — LIDOCAINE HCL (PF) 1 % IJ SOLN
INTRAMUSCULAR | Status: AC
  Filled 2022-08-15: qty 30

## 2022-08-15 MED ORDER — FENTANYL CITRATE (PF) 100 MCG/2ML IJ SOLN
INTRAMUSCULAR | Status: DC | PRN
  Administered 2022-08-15 (×2): 25 ug via INTRAVENOUS

## 2022-08-15 MED ORDER — SODIUM CHLORIDE 0.9 % WEIGHT BASED INFUSION
3.0000 mL/kg/h | INTRAVENOUS | Status: AC
  Administered 2022-08-15: 3 mL/kg/h via INTRAVENOUS

## 2022-08-15 MED ORDER — HEPARIN (PORCINE) IN NACL 1000-0.9 UT/500ML-% IV SOLN
INTRAVENOUS | Status: DC | PRN
  Administered 2022-08-15 (×2): 500 mL

## 2022-08-15 MED ORDER — IOHEXOL 350 MG/ML SOLN
INTRAVENOUS | Status: DC | PRN
  Administered 2022-08-15: 115 mL

## 2022-08-15 MED ORDER — NITROGLYCERIN 1 MG/10 ML FOR IR/CATH LAB
INTRA_ARTERIAL | Status: AC
  Filled 2022-08-15: qty 10

## 2022-08-15 MED ORDER — VERAPAMIL HCL 2.5 MG/ML IV SOLN
INTRAVENOUS | Status: AC
  Filled 2022-08-15: qty 2

## 2022-08-15 MED ORDER — LIDOCAINE HCL (PF) 1 % IJ SOLN
INTRAMUSCULAR | Status: DC | PRN
  Administered 2022-08-15: 3 mL
  Administered 2022-08-15: 2 mL

## 2022-08-15 MED ORDER — FENTANYL CITRATE (PF) 100 MCG/2ML IJ SOLN
INTRAMUSCULAR | Status: AC
  Filled 2022-08-15: qty 2

## 2022-08-15 MED ORDER — SODIUM CHLORIDE 0.9% FLUSH: 3.0000 mL | Freq: Two times a day (BID) | INTRAVENOUS | Status: DC

## 2022-08-15 MED ORDER — SODIUM CHLORIDE 0.9% FLUSH: 3.0000 mL | INTRAVENOUS | Status: DC | PRN

## 2022-08-15 MED ORDER — VERAPAMIL HCL 2.5 MG/ML IV SOLN
INTRAVENOUS | Status: DC | PRN
  Administered 2022-08-15: 10 mL via INTRA_ARTERIAL

## 2022-08-15 MED ORDER — ONDANSETRON HCL 4 MG/2ML IJ SOLN: 4.0000 mg | Freq: Four times a day (QID) | INTRAMUSCULAR | Status: DC | PRN

## 2022-08-15 MED ORDER — MIDAZOLAM HCL 2 MG/2ML IJ SOLN
INTRAMUSCULAR | Status: DC | PRN
  Administered 2022-08-15: 2 mg via INTRAVENOUS
  Administered 2022-08-15: 1 mg via INTRAVENOUS

## 2022-08-15 MED ORDER — SODIUM CHLORIDE 0.9 % WEIGHT BASED INFUSION: 1.0000 mL/kg/h | INTRAVENOUS | Status: DC

## 2022-08-15 SURGICAL SUPPLY — 19 items
BAND CMPR LRG ZPHR (HEMOSTASIS) ×1
BAND ZEPHYR COMPRESS 30 LONG (HEMOSTASIS) IMPLANT
CATH 5FR JL3.5 JR4 ANG PIG MP (CATHETERS) IMPLANT
CATH INFINITI 5 FR AR1 MOD (CATHETERS) IMPLANT
CATH INFINITI 5FR AL1 (CATHETERS) IMPLANT
CATH LAUNCHER 6FR AL1 (CATHETERS) IMPLANT
CATH SWAN GANZ 7F STRAIGHT (CATHETERS) IMPLANT
CATHETER LAUNCHER 6FR AL1 (CATHETERS) ×1
GLIDESHEATH SLEND SS 6F .021 (SHEATH) IMPLANT
GLIDESHEATH SLENDER 7FR .021G (SHEATH) IMPLANT
GUIDEWIRE INQWIRE 1.5J.035X260 (WIRE) IMPLANT
GUIDEWIRE PRESSURE X 175 (WIRE) IMPLANT
INQWIRE 1.5J .035X260CM (WIRE) ×1
KIT ENCORE 26 ADVANTAGE (KITS) IMPLANT
KIT HEART LEFT (KITS) ×1 IMPLANT
PACK CARDIAC CATHETERIZATION (CUSTOM PROCEDURE TRAY) ×1 IMPLANT
SHEATH 6FR 85 DEST SLENDER (SHEATH) IMPLANT
TRANSDUCER W/STOPCOCK (MISCELLANEOUS) ×1 IMPLANT
TUBING CIL FLEX 10 FLL-RA (TUBING) ×1 IMPLANT

## 2022-08-15 NOTE — Discharge Instructions (Signed)

## 2022-08-15 NOTE — Telephone Encounter (Signed)
Called and notified patient and patients wife.  Patient would like to keep current settings

## 2022-08-15 NOTE — Interval H&P Note (Signed)
History and Physical Interval Note:  08/15/2022 7:54 AM  Joseph [name].  has presented today for surgery, with the diagnosis of CAD.  The various methods of treatment have been discussed with the patient and family. After consideration of risks, benefits and other options for treatment, the patient has consented to  Procedure(s): RIGHT/LEFT HEART CATH AND CORONARY ANGIOGRAPHY (N/A) as a surgical intervention.  The patient's history has been reviewed, patient examined, no change in status, stable for surgery.  I have reviewed the patient's chart and labs.  Questions were answered to the patient's satisfaction.    Plan radial cath. Pt with alpha-gal allergy - potential for cross reactivity to heparin. Reviewed literature. Risk of reaction 2%. I think risk/benefit favors radial cath over femoral without heparin as bleeding risk of femoral cath > radial. Plan to proceed with R/L cath from radial access.  Sherren Mocha

## 2022-08-15 NOTE — Progress Notes (Signed)
TR BAND REMOVAL  LOCATION:    right radial  DEFLATED PER PROTOCOL:    Yes.    TIME BAND OFF / DRESSING APPLIED:    1135 gauze dressing applied   SITE UPON ARRIVAL:    Level 0  SITE AFTER BAND REMOVAL:    Level 0  CIRCULATION SENSATION AND MOVEMENT:    Within Normal Limits   Yes.    COMMENTS:   no issues noted

## 2022-08-16 ENCOUNTER — Telehealth: Payer: Self-pay | Admitting: Cardiovascular Disease

## 2022-08-16 ENCOUNTER — Encounter (HOSPITAL_COMMUNITY): Payer: Self-pay | Admitting: Cardiovascular Disease

## 2022-08-16 DIAGNOSIS — I421 Obstructive hypertrophic cardiomyopathy: Secondary | ICD-10-CM

## 2022-08-16 MED FILL — Nitroglycerin IV Soln 100 MCG/ML in D5W: INTRA_ARTERIAL | Qty: 10 | Status: AC

## 2022-08-16 NOTE — Telephone Encounter (Signed)
-----   Message from Sherren Mocha, MD sent at 08/15/2022  9:22 AM EST ----- Judson Roch - can you refer him to Dr Gasper Sells for hypertrophic cardiomyopathy? thanks

## 2022-08-16 NOTE — Telephone Encounter (Signed)
Referral placed for Chandrasekhar. Patient called and scheduled for 10/31/22 '@1'$ :40pm.

## 2022-08-22 ENCOUNTER — Encounter: Payer: Self-pay | Admitting: Cardiovascular Disease

## 2022-08-23 ENCOUNTER — Encounter: Payer: Self-pay | Admitting: Urology

## 2022-09-13 ENCOUNTER — Telehealth (HOSPITAL_COMMUNITY): Payer: Self-pay | Admitting: *Deleted

## 2022-09-13 NOTE — Telephone Encounter (Signed)
Attempted to call patient regarding upcoming cardiac MRI appointment. Left message on voicemail with name and callback number  Fergie Sherbert RN Navigator Cardiac Imaging [facility] [phone] Office [phone] Cell  

## 2022-09-13 NOTE — Telephone Encounter (Signed)
Patient's wife returning call about the patient's upcoming cardiac imaging study; pt's wife verbalizes understanding of appt date/time, parking situation and where to check in, and medications ordered, and verified current allergies; name and call back number provided for further questions should they arise  [place] [facility] [phone] office [phone] cell  Patient's wife reports claustrophobia but has 1mg  ativan but denies metal that would react with the magnet.

## 2022-09-14 ENCOUNTER — Other Ambulatory Visit: Payer: Self-pay | Admitting: Cardiovascular Disease

## 2022-09-14 ENCOUNTER — Ambulatory Visit
Admission: RE | Admit: 2022-09-14 | Discharge: 2022-09-14 | Disposition: A | Discharge status: Home / Self Care | Payer: Medicare Other | Source: Ambulatory Visit | Attending: Cardiovascular Disease | Admitting: Cardiovascular Disease

## 2022-09-14 DIAGNOSIS — I421 Obstructive hypertrophic cardiomyopathy: Secondary | ICD-10-CM

## 2022-09-14 DIAGNOSIS — I25119 Atherosclerotic heart disease of native coronary artery with unspecified angina pectoris: Secondary | ICD-10-CM | POA: Insufficient documentation

## 2022-09-14 DIAGNOSIS — Z01810 Encounter for preprocedural cardiovascular examination: Secondary | ICD-10-CM | POA: Insufficient documentation

## 2022-09-14 MED ORDER — GADOBUTROL 1 MMOL/ML IV SOLN
15.0000 mL | Freq: Once | INTRAVENOUS | Status: AC | PRN
  Administered 2022-09-14: 15 mL via INTRAVENOUS

## 2022-09-19 NOTE — Progress Notes (Unsigned)
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  Do not shower for the first 24 hours. You may shower after the first 24 hours.  Press the button if you feel a symptom. You will hear a small click. Record Date, Time and  Symptom in the Patient Logbook.  When you are ready to remove the patch, follow instructions on the last 2 pages of Patient  Logbook. Stick patch monitor onto the last page of Patient Logbook.  Place Patient Logbook in the blue and white [name]. Use locking tab on [name] and tape [name] closed  securely. The blue and white [name] has prepaid postage on it. Please place it in the mailbox as  soon as possible. Your physician should have your test results approximately 7 days after the  monitor has been mailed back to [facility].  Call [place] at [phone] if you have questions regarding  your ZIO XT patch monitor. Call them immediately if you see an orange light blinking on your  monitor.  If your monitor falls off in less than 4 days, contact our Monitor department at [phone].  If your monitor becomes loose or falls off after 4 days call [employer] at [phone] for  suggestions on securing your monitor     Signed, Werner Lean, MD  09/20/2022 11:18 AM    [place]

## 2022-09-20 ENCOUNTER — Encounter: Payer: Self-pay | Admitting: Internal Medicine

## 2022-09-20 ENCOUNTER — Ambulatory Visit: Payer: Medicare Other | Attending: Internal Medicine

## 2022-09-20 ENCOUNTER — Ambulatory Visit: Payer: Medicare Other | Attending: Internal Medicine | Admitting: Internal Medicine

## 2022-09-20 VITALS — BP 104/70 | HR 63 | Ht 72.0 in | Wt 270.0 lb

## 2022-09-20 DIAGNOSIS — R002 Palpitations: Secondary | ICD-10-CM | POA: Insufficient documentation

## 2022-09-20 DIAGNOSIS — K760 Fatty (change of) liver, not elsewhere classified: Secondary | ICD-10-CM | POA: Diagnosis not present

## 2022-09-20 DIAGNOSIS — E1122 Type 2 diabetes mellitus with diabetic chronic kidney disease: Secondary | ICD-10-CM | POA: Diagnosis not present

## 2022-09-20 DIAGNOSIS — E7849 Other hyperlipidemia: Secondary | ICD-10-CM | POA: Diagnosis not present

## 2022-09-20 DIAGNOSIS — I421 Obstructive hypertrophic cardiomyopathy: Secondary | ICD-10-CM

## 2022-09-20 DIAGNOSIS — E039 Hypothyroidism, unspecified: Secondary | ICD-10-CM | POA: Diagnosis not present

## 2022-09-20 DIAGNOSIS — R5383 Other fatigue: Secondary | ICD-10-CM | POA: Diagnosis not present

## 2022-09-20 NOTE — Patient Instructions (Signed)
Medication Instructions:  Your physician has recommended you make the following change in your medication:  STOP: amlodipine (Norvasc)  *If you need a refill on your cardiac medications before your next appointment, please call your pharmacy*   Lab Work: NONE If you have labs (blood work) drawn today and your tests are completely normal, you will receive your results only by: [place] (if you have MyChart) OR A paper copy in the mail If you have any lab test that is abnormal or we need to change your treatment, we will call you to review the results.   Testing/Procedures: Your physician has requested that you wear a heart monitor.   Follow-Up: At [facility], you and your health needs are our priority.  As part of our continuing mission to provide you with exceptional heart care, we have created designated Provider Care Teams.  These Care Teams include your primary Cardiologist (physician) and Advanced Practice Providers (APPs -  Physician Assistants and Nurse Practitioners) who all work together to provide you with the care you need, when you need it.  We recommend signing up for the patient portal called "MyChart".  Sign up information is provided on this After Visit Summary.  MyChart is used to connect with patients for Virtual Visits (Telemedicine).  Patients are able to view lab/test results, encounter notes, upcoming appointments, etc.  Non-urgent messages can be sent to your provider as well.   To learn more about what you can do with MyChart, go to [email or web address].    Your next appointment:   4 month(s)  Provider:   Rudean Haskell, MD  Other Instructions [facility]- Long Term Monitor Instructions  Your physician has requested you wear a ZIO patch monitor for 7 days.  This is a single patch monitor. [employer] supplies one patch monitor per enrollment. Additional stickers are not available. Please do not apply patch if you will be having a Nuclear  Stress Test,   Cardiac CT, MRI, or Chest Xray during the period you would be wearing the  monitor. The patch cannot be worn during these tests. You cannot remove and re-apply the  ZIO XT patch monitor.  Your ZIO patch monitor will be mailed 3 day USPS to your address on file. It may take 3-5 days  to receive your monitor after you have been enrolled.  Once you have received your monitor, please review the enclosed instructions. Your monitor  has already been registered assigning a specific monitor serial # to you.  Billing and Patient Assistance Program Information  We have supplied [employer] with any of your insurance information on file for billing purposes. [employer] offers a sliding scale Patient Assistance Program for patients that do not have  insurance, or whose insurance does not completely cover the cost of the ZIO monitor.  You must apply for the Patient Assistance Program to qualify for this discounted rate.  To apply, please call [employer] at [phone], select option 4, select option 2, ask to apply for  Patient Assistance Program. Theodore Demark will ask your household income, and how many people  are in your household. They will quote your out-of-pocket cost based on that information.  [employer] will also be able to set up a 12-month, interest-free payment plan if needed.  Applying the monitor   Shave hair from upper left chest.  Hold abrader disc by orange tab. Rub abrader in 40 strokes over the upper left chest as  indicated in your monitor instructions.  Clean area with 4 enclosed  alcohol pads. Let dry.  Apply patch as indicated in monitor instructions. Patch will be placed under collarbone on left  side of chest with arrow pointing upward.  Rub patch adhesive wings for 2 minutes. Remove white label marked "1". Remove the white  label marked "2". Rub patch adhesive wings for 2 additional minutes.  While looking in a mirror, press and release button in center of patch. A small green  light will  flash 3-4 times. This will be your only indicator that the monitor has been turned on.  Do not shower for the first 24 hours. You may shower after the first 24 hours.  Press the button if you feel a symptom. You will hear a small click. Record Date, Time and  Symptom in the Patient Logbook.  When you are ready to remove the patch, follow instructions on the last 2 pages of Patient  Logbook. Stick patch monitor onto the last page of Patient Logbook.  Place Patient Logbook in the blue and white box. Use locking tab on box and tape box closed  securely. The blue and white box has prepaid postage on it. Please place it in the mailbox as  soon as possible. Your physician should have your test results approximately 7 days after the  monitor has been mailed back to [facility].  Call [place] at [phone] if you have questions regarding  your ZIO XT patch monitor. Call them immediately if you see an orange light blinking on your  monitor.  If your monitor falls off in less than 4 days, contact our Monitor department at [phone].  If your monitor becomes loose or falls off after 4 days call [employer] at [phone] for  suggestions on securing your monitor

## 2022-09-20 NOTE — Progress Notes (Unsigned)
Enrolled for [employer] to mail a ZIO XT long term holter monitor to the patients address on file.  

## 2022-09-22 DIAGNOSIS — R002 Palpitations: Secondary | ICD-10-CM

## 2022-09-22 DIAGNOSIS — I421 Obstructive hypertrophic cardiomyopathy: Secondary | ICD-10-CM

## 2022-09-23 DIAGNOSIS — M19171 Post-traumatic osteoarthritis, right ankle and foot: Secondary | ICD-10-CM | POA: Diagnosis not present

## 2022-09-23 DIAGNOSIS — K760 Fatty (change of) liver, not elsewhere classified: Secondary | ICD-10-CM | POA: Diagnosis not present

## 2022-09-23 DIAGNOSIS — Z79891 Long term (current) use of opiate analgesic: Secondary | ICD-10-CM | POA: Diagnosis not present

## 2022-09-23 DIAGNOSIS — I25119 Atherosclerotic heart disease of native coronary artery with unspecified angina pectoris: Secondary | ICD-10-CM | POA: Diagnosis not present

## 2022-09-23 DIAGNOSIS — E7849 Other hyperlipidemia: Secondary | ICD-10-CM | POA: Diagnosis not present

## 2022-09-23 DIAGNOSIS — I255 Ischemic cardiomyopathy: Secondary | ICD-10-CM | POA: Diagnosis not present

## 2022-09-23 DIAGNOSIS — I10 Essential (primary) hypertension: Secondary | ICD-10-CM | POA: Diagnosis not present

## 2022-09-23 DIAGNOSIS — R7301 Impaired fasting glucose: Secondary | ICD-10-CM | POA: Diagnosis not present

## 2022-09-23 DIAGNOSIS — Z91018 Allergy to other foods: Secondary | ICD-10-CM | POA: Diagnosis not present

## 2022-09-23 DIAGNOSIS — K7581 Nonalcoholic steatohepatitis (NASH): Secondary | ICD-10-CM | POA: Diagnosis not present

## 2022-09-23 DIAGNOSIS — R4582 Worries: Secondary | ICD-10-CM | POA: Diagnosis not present

## 2022-09-23 DIAGNOSIS — E039 Hypothyroidism, unspecified: Secondary | ICD-10-CM | POA: Diagnosis not present

## 2022-09-27 ENCOUNTER — Encounter: Payer: Self-pay | Admitting: Internal Medicine

## 2022-09-27 DIAGNOSIS — I421 Obstructive hypertrophic cardiomyopathy: Secondary | ICD-10-CM

## 2022-09-30 LAB — TSH: TSH: 5.78 (ref 0.41–5.90)

## 2022-10-05 ENCOUNTER — Telehealth: Payer: Self-pay | Admitting: Pharmacist

## 2022-10-05 NOTE — Telephone Encounter (Signed)
Prior authorization denied - [employer] not on pt's formulary. Medication authorization requires that pt try 4 of the following covered drugs or provide specific reason why they are not appropriate for pt to try: atenolol, bisoprolol, propranolol, diltiazem, or verapamil.  Pt is already on metoprolol, changing to different beta blocker doesn't make clinical sense. HR already 63 on metoprolol so adding diltiazem or verapamil are not clinically appropriate either. Appeals has been faxed to expedited # below.  Expedited appeal fax: [phone] Standard appeal fax: [phone]

## 2022-10-05 NOTE — Telephone Encounter (Signed)
[employer] PA submitted, key [id number]. Portal enrollment form faxed.

## 2022-10-11 NOTE — Telephone Encounter (Signed)
CoverMyMeds called to provide Key for the [facility]' appeal.  Key: [id number] Status: pending (10/11/2022)

## 2022-10-12 NOTE — Telephone Encounter (Signed)
Additional appeals info faxed.

## 2022-10-12 NOTE — Telephone Encounter (Signed)
Received fax that pt's info has been forwarded to pt assistance program as his copay is $1,026. Preferred specialty pharmacy will be [facility], their NPI is [id number]. Their fax also states that prior auth was approved through 06/20/23, I will confirm this with insurance in a few days as they also faxed over additional appeals info yesterday that I didn't fax until today after we received this fax.

## 2022-10-13 ENCOUNTER — Telehealth: Payer: Self-pay | Admitting: Internal Medicine

## 2022-10-13 DIAGNOSIS — S91332A Puncture wound without foreign body, left foot, initial encounter: Secondary | ICD-10-CM | POA: Diagnosis not present

## 2022-10-13 DIAGNOSIS — R030 Elevated blood-pressure reading, without diagnosis of hypertension: Secondary | ICD-10-CM | POA: Diagnosis not present

## 2022-10-13 NOTE — Telephone Encounter (Signed)
Called spouse advised that the [employer] REMS team handles all financial interactions.  Spouse will f/u with cam[employer] team.   Spouse then reports that she has an echo on 10/26/22 and wants to know how much test will be.  Advised spouse to call back in and choose prompts for Billing and they should be able to assist her.  No further questions at this time.

## 2022-10-13 NOTE — Telephone Encounter (Signed)
Pt's spouse would like a callback regarding pt assistance for medication Camzyos and would like to discuss a few options. Please advise

## 2022-10-14 DIAGNOSIS — S91342A Puncture wound with foreign body, left foot, initial encounter: Secondary | ICD-10-CM | POA: Diagnosis not present

## 2022-10-14 NOTE — Telephone Encounter (Signed)
Called insurance for update, they confirmed appeals was overturned and Cam[employer] now covered through 06/20/23. Asked them to fax [place] approval letter which they will do.

## 2022-10-18 DIAGNOSIS — M79671 Pain in right foot: Secondary | ICD-10-CM | POA: Diagnosis not present

## 2022-10-20 DIAGNOSIS — I421 Obstructive hypertrophic cardiomyopathy: Secondary | ICD-10-CM | POA: Diagnosis not present

## 2022-10-20 DIAGNOSIS — R002 Palpitations: Secondary | ICD-10-CM | POA: Diagnosis not present

## 2022-10-21 ENCOUNTER — Other Ambulatory Visit (HOSPITAL_COMMUNITY): Payer: Medicare Other

## 2022-10-21 DIAGNOSIS — M79672 Pain in left foot: Secondary | ICD-10-CM | POA: Diagnosis not present

## 2022-10-21 NOTE — Telephone Encounter (Signed)
Called pt reports has received mavacamten 5 mg PO in the mail.  Was planning to start tomorrow but had a fishing accident and may have surgery soon. Advised pt MD would like him to start on Monday. Pt and/or spouse will call in on Monday to let me know when will start. Will schedule Echos at that time.

## 2022-10-31 ENCOUNTER — Ambulatory Visit: Payer: Medicare Other | Admitting: Internal Medicine

## 2022-11-02 ENCOUNTER — Telehealth: Payer: Self-pay | Admitting: Internal Medicine

## 2022-11-02 NOTE — Telephone Encounter (Signed)
Called Patient - he notes that days after starting mavacamten, he noted transient dizziness that has greatly improved.  This is common with starting mavacamten and we had discussed this before - he notes that he has felt a decrease in the efficacy of his pain medications, darks black stools, and worsening constipation; he notes needing miralax or laxatives to have a bowel movement.  This is not an affect reported in the literature, though there is an affect on CYP3A4 from this medication.  He notes that his pain is worse to the point that he cannot continue this medication - we discussed that there is not a rebound from this medication; it is ok to stop - we discuss future options (getting SRT, trial of aficamten in 2025 when clinicals trials complete; consideration of MAPLE at [facility])  Team: please DC his Carmelia Bake and cancel his screening echos Shamea: please cancel him from the portal  I see his wife (whos mother had HCM) in June 2024; she will let me know how he is doing and we can plan next steps  Riley Lam, MD Cardiologist [facility]  [address] [place], [address] [place], [place] [address] [phone]  5:28 PM

## 2022-11-03 NOTE — Telephone Encounter (Addendum)
Cancelled 3 upcoming echos.  Patient already aware/in agreement w plan per Dr. Izora Ribas.   Mavacamten not on medication list for me to discontinue.

## 2022-11-07 NOTE — Telephone Encounter (Signed)
Called [employer] REMS portal asked to have pt deactivated per MD and pt request.

## 2022-11-09 ENCOUNTER — Ambulatory Visit
Admission: RE | Admit: 2022-11-09 | Discharge: 2022-11-09 | Disposition: A | Discharge status: Home / Self Care | Payer: Medicare Other | Source: Ambulatory Visit | Attending: Urology | Admitting: Urology

## 2022-11-09 ENCOUNTER — Ambulatory Visit
Admission: RE | Admit: 2022-11-09 | Discharge: 2022-11-09 | Disposition: A | Discharge status: Home / Self Care | Payer: Medicare Other | Attending: Urology | Admitting: Urology

## 2022-11-09 DIAGNOSIS — N200 Calculus of kidney: Secondary | ICD-10-CM

## 2022-11-10 ENCOUNTER — Telehealth: Payer: Self-pay

## 2022-11-10 ENCOUNTER — Other Ambulatory Visit: Payer: Self-pay | Admitting: Physician Assistant

## 2022-11-10 ENCOUNTER — Ambulatory Visit: Payer: Medicare Other | Admitting: Physician Assistant

## 2022-11-10 VITALS — BP 119/70 | HR 62 | Ht 72.0 in | Wt 265.1 lb

## 2022-11-10 DIAGNOSIS — N201 Calculus of ureter: Secondary | ICD-10-CM | POA: Diagnosis not present

## 2022-11-10 LAB — URINALYSIS, COMPLETE
Bilirubin, UA: NEGATIVE
Glucose, UA: NEGATIVE
Nitrite, UA: NEGATIVE
RBC, UA: NEGATIVE
Specific Gravity, UA: 1.02 (ref 1.005–1.030)
Urobilinogen, Ur: 4 mg/dL — ABNORMAL HIGH (ref 0.2–1.0)
pH, UA: 7 (ref 5.0–7.5)

## 2022-11-10 LAB — MICROSCOPIC EXAMINATION

## 2022-11-10 MED ORDER — DIAZEPAM 2 MG PO TABS: 2.0000 mg | ORAL_TABLET | Freq: Three times a day (TID) | ORAL | 0 refills | Status: DC | PRN

## 2022-11-10 MED ORDER — ONDANSETRON 4 MG PO TBDP: 4.0000 mg | ORAL_TABLET | Freq: Three times a day (TID) | ORAL | 0 refills | Status: DC | PRN

## 2022-11-10 MED ORDER — SULFAMETHOXAZOLE-TRIMETHOPRIM 800-160 MG PO TABS: 1.0000 | ORAL_TABLET | Freq: Two times a day (BID) | ORAL | 0 refills | Status: AC
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
## 2022-11-10 NOTE — Telephone Encounter (Signed)
   Patient Name: Joseph [name].  DOB: Nov 18, 1965 MRN: [id number]  Primary Cardiologist: Tonny Bollman, MD  Chart reviewed as part of pre-operative protocol coverage.   Patient contacted to discuss upcoming lithotripsy procedure and to see how he has been doing since his most recent follow-up on 09/20/2022.  Patient was unavailable at that time and detailed message left for him to return my call at his earliest convenience.   Napoleon Form, Leodis Rains, NP 11/10/2022, 4:10 PM

## 2022-11-10 NOTE — Telephone Encounter (Signed)
   Patient Name: Joseph [name].  DOB: March 05, 1966 MRN: [id number]  Primary Cardiologist: Tonny Bollman, MD  Chart reviewed as part of pre-operative protocol coverage. Given past medical history and time since last visit, based on ACC/AHA guidelines, Joseph Philibert. is at acceptable risk for the planned procedure without further cardiovascular testing.  Joseph [name] reports that he is feeling much better and is able to complete 4 METS of activity without any difficulty.   The patient was advised that if he develops new symptoms prior to surgery to contact our office to arrange for a follow-up visit, and he verbalized understanding.  Regarding ASA therapy it may be stopped 5-7 days prior to surgery with a plan to resume it as soon as felt to be feasible from a surgical standpoint in the post-operative period.   I will route this recommendation to the requesting party via Epic fax function and remove from pre-op pool.  Please call with questions.  Napoleon Form, Leodis Rains, NP 11/10/2022, 4:39 PM
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
## 2022-11-10 NOTE — Progress Notes (Signed)
  Phone Number: [phone] for Surgical Coordinator Fax Number: [phone]  REQUEST FOR SURGICAL CLEARANCE       Date: Date: 11/10/22  Faxed to: Dr. Izora Ribas, MD  Surgeon: Dr. Legrand Rams, MD  Date of Surgery: 11/17/2022  Operation: Left Extracorporeal Shock Wave Lithotripsy   Anesthesia Type: MAC   Diagnosis: Left Ureteral Stone    Patient Requires:   Cardiac / Vascular Clearance : Yes  Reason: Would like to know if patient is stable enough for procedure    Risk Assessment:    Low   []       Moderate   []     High   []           This patient is optimized for surgery  YES []       NO   []    I recommend further assessment/workup prior to surgery. YES []      NO  []   Appointment scheduled for: _______________________   Further recommendations: ____________________________________     Physician Signature:__________________________________   Printed Name: ________________________________________   Date: _________________

## 2022-11-10 NOTE — Progress Notes (Signed)
ESWL ORDER FORM  Expected date of procedure: Soonest available pending cardiac clearance from Dr. Izora Ribas  Surgeon:  TBD as above  Post op standing: 2-4wk follow up w/KUB prior  Anticoagulation/Aspirin/NSAID standing order: Hold all 72 hours prior  Anesthesia standing order: MAC  VTE standing: SCD's  Dx: Left Ureteral Stone  Procedure: left Extracorporeal shock wave lithotripsy  CPT : 50590  Standing Order Set:   *NPO after mn, KUB  *NS 132ml/hr, Benadryl 25mg  PO, Valium 10mg  PO, Zofran 4mg  IV    Medications if other than standing orders:    Penicillin allergic, abx TBD per preop ucx dated 5/23

## 2022-11-10 NOTE — Telephone Encounter (Signed)
   Pre-operative Risk Assessment    Patient Name: Joseph [name].  DOB: Dec 26, 1965 MRN: [id number]      Request for Surgical Clearance    Procedure:   Left Extracorporeal Shock Wave Lithotripsy   Date of Surgery:  Clearance 11/17/22                                 Surgeon:  Dr. Legrand Rams, MD Surgeon's Group or Practice Name:  [facility]  Phone number:  [phone] Fax number:  [phone]   Type of Clearance Requested:   - Medical  - Pharmacy:  Hold Aspirin     Type of Anesthesia:  MAC Additional requests/questions:   N/A  SignedStevan Born   11/10/2022, 3:16 PM

## 2022-11-10 NOTE — Patient Instructions (Addendum)
We are planning for a shockwave lithotripsy to treat your distal left ureteral stone. We have to get cardiac clearance prior, so the timing of this will dictate your procedure date. In the meantime, please do the following: -Continue Flomax 0.4mg  daily -Stay well hydrated -Treat any pain with ibuprofen/tylenol or oxycodone -Treat any anxiety with Valium as prescribed today -Treat any nausea with Zofran as prescribed today -Start Bactrim antibiotics for possible urinary infection  Please call our office immediately (we are open 8a-5p Monday-Friday) or go to the Emergency Department if you develop any of the following: -Fever -Nausea and/or vomiting uncontrollable with Zofran -Uncontrollable pain

## 2022-11-14 ENCOUNTER — Encounter: Payer: Self-pay | Admitting: Urology

## 2022-11-15 ENCOUNTER — Ambulatory Visit: Payer: Medicare Other | Admitting: Physician Assistant

## 2022-11-16 LAB — CULTURE, URINE COMPREHENSIVE

## 2022-11-17 ENCOUNTER — Ambulatory Visit: Payer: Medicare Other

## 2022-11-17 ENCOUNTER — Encounter: Payer: Self-pay | Admitting: Urology

## 2022-11-17 ENCOUNTER — Encounter
Admission: RE | Disposition: A | Discharge status: Home / Self Care | Payer: Self-pay | Source: Home / Self Care | Attending: Urology

## 2022-11-17 ENCOUNTER — Other Ambulatory Visit: Payer: Self-pay

## 2022-11-17 ENCOUNTER — Ambulatory Visit (HOSPITAL_COMMUNITY): Payer: Medicare Other

## 2022-11-17 ENCOUNTER — Ambulatory Visit
Admission: RE | Admit: 2022-11-17 | Discharge: 2022-11-17 | Disposition: A | Discharge status: Home / Self Care | Payer: Medicare Other | Attending: Urology | Admitting: Urology

## 2022-11-17 DIAGNOSIS — G8929 Other chronic pain: Secondary | ICD-10-CM | POA: Insufficient documentation

## 2022-11-17 DIAGNOSIS — I2510 Atherosclerotic heart disease of native coronary artery without angina pectoris: Secondary | ICD-10-CM | POA: Insufficient documentation

## 2022-11-17 DIAGNOSIS — N400 Enlarged prostate without lower urinary tract symptoms: Secondary | ICD-10-CM | POA: Insufficient documentation

## 2022-11-17 DIAGNOSIS — N200 Calculus of kidney: Secondary | ICD-10-CM | POA: Diagnosis not present

## 2022-11-17 DIAGNOSIS — K5900 Constipation, unspecified: Secondary | ICD-10-CM | POA: Insufficient documentation

## 2022-11-17 DIAGNOSIS — N201 Calculus of ureter: Secondary | ICD-10-CM | POA: Diagnosis not present

## 2022-11-17 DIAGNOSIS — Z79891 Long term (current) use of opiate analgesic: Secondary | ICD-10-CM | POA: Insufficient documentation

## 2022-11-17 DIAGNOSIS — I10 Essential (primary) hypertension: Secondary | ICD-10-CM | POA: Diagnosis not present

## 2022-11-17 DIAGNOSIS — N202 Calculus of kidney with calculus of ureter: Secondary | ICD-10-CM | POA: Insufficient documentation

## 2022-11-17 DIAGNOSIS — Z79899 Other long term (current) drug therapy: Secondary | ICD-10-CM | POA: Diagnosis not present

## 2022-11-17 HISTORY — PX: EXTRACORPOREAL SHOCK WAVE LITHOTRIPSY: SHX1557

## 2022-11-17 SURGERY — LITHOTRIPSY, ESWL
Anesthesia: Moderate Sedation | Laterality: Left

## 2022-11-17 MED ORDER — DIAZEPAM 5 MG PO TABS
10.0000 mg | ORAL_TABLET | ORAL | Status: AC
  Administered 2022-11-17: 10 mg via ORAL

## 2022-11-17 MED ORDER — SODIUM CHLORIDE 0.9 % IV SOLN: INTRAVENOUS | Status: DC

## 2022-11-17 MED ORDER — DIPHENHYDRAMINE HCL 25 MG PO CAPS
ORAL_CAPSULE | ORAL | Status: AC
  Filled 2022-11-17: qty 1

## 2022-11-17 MED ORDER — ONDANSETRON HCL 4 MG/2ML IJ SOLN
4.0000 mg | Freq: Once | INTRAMUSCULAR | Status: AC
  Administered 2022-11-17: 4 mg via INTRAVENOUS

## 2022-11-17 MED ORDER — DIAZEPAM 5 MG PO TABS
ORAL_TABLET | ORAL | Status: AC
  Filled 2022-11-17: qty 2

## 2022-11-17 MED ORDER — ONDANSETRON HCL 4 MG/2ML IJ SOLN
INTRAMUSCULAR | Status: AC
  Filled 2022-11-17: qty 2

## 2022-11-17 MED ORDER — DIPHENHYDRAMINE HCL 25 MG PO CAPS
25.0000 mg | ORAL_CAPSULE | ORAL | Status: AC
  Administered 2022-11-17: 25 mg via ORAL

## 2022-11-17 NOTE — Brief Op Note (Signed)
11/17/2022  8:34 AM  PATIENT:  Joseph [name].  57 y.o. male  PRE-OPERATIVE DIAGNOSIS:  6mm Left distal Ureteral Stone  POST-OPERATIVE DIAGNOSIS:  Same  PROCEDURE:  Procedure(s): EXTRACORPOREAL SHOCK WAVE LITHOTRIPSY (ESWL) (Left)  SURGEON:  Surgeon(s) and Role:    Sondra Come, MD - Primary  ANESTHESIA: Conscious Sedation  EBL:  None  Drains: None  Specimen: None  Findings:  Uncomplicated SWL, tolerated well, stone appeared to smudge  DISPO: Flomax, pain meds PRN, RTC 2 weeks KUB  Legrand Rams, MD 11/17/2022

## 2022-11-17 NOTE — Interval H&P Note (Signed)
UROLOGY H&P UPDATE  Agree with prior H&P dated 11/10/22, 6mm left distal ureteral stone.  Cardiac: RRR Lungs: CTA bilaterally  Laterality: left Procedure: shockwave lithotripsy  Urine: culture 5/23 1k mixed flora  Informed consent obtained, we specifically discussed the risks of bleeding/hematoma, infection, post-operative pain/obstructive fragments/renal colic, need for additional procedures.  Sondra Come, MD 11/17/2022

## 2022-11-18 ENCOUNTER — Encounter: Payer: Self-pay | Admitting: Urology

## 2022-11-24 ENCOUNTER — Telehealth: Payer: Self-pay | Admitting: Internal Medicine

## 2022-11-24 ENCOUNTER — Ambulatory Visit: Payer: Medicare Other | Attending: Internal Medicine

## 2022-11-24 ENCOUNTER — Other Ambulatory Visit: Payer: Self-pay

## 2022-11-24 DIAGNOSIS — R55 Syncope and collapse: Secondary | ICD-10-CM
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
## 2022-11-24 NOTE — Progress Notes (Unsigned)
Pt in office with spouse reported to MD that he had syncope.  MD ordered 30 day preventice monitor to be placed in office.

## 2022-11-24 NOTE — Progress Notes (Unsigned)
Preventice event serial # [id number] from office inventory applied to patient.

## 2022-12-09 ENCOUNTER — Other Ambulatory Visit: Payer: Self-pay

## 2022-12-09 DIAGNOSIS — N200 Calculus of kidney: Secondary | ICD-10-CM

## 2022-12-12 ENCOUNTER — Ambulatory Visit: Payer: Medicare Other | Admitting: Physician Assistant

## 2022-12-12 ENCOUNTER — Encounter: Payer: Self-pay | Admitting: Urology

## 2022-12-15 ENCOUNTER — Other Ambulatory Visit (HOSPITAL_COMMUNITY): Payer: Medicare Other

## 2022-12-19 ENCOUNTER — Ambulatory Visit
Admission: RE | Admit: 2022-12-19 | Discharge: 2022-12-19 | Disposition: A | Discharge status: Home / Self Care | Payer: Medicare Other | Attending: Physician Assistant | Admitting: Physician Assistant

## 2022-12-19 ENCOUNTER — Encounter: Payer: Self-pay | Admitting: Physician Assistant

## 2022-12-19 ENCOUNTER — Ambulatory Visit: Payer: Medicare Other | Admitting: Physician Assistant

## 2022-12-19 ENCOUNTER — Ambulatory Visit
Admission: RE | Admit: 2022-12-19 | Discharge: 2022-12-19 | Disposition: A | Discharge status: Home / Self Care | Payer: Medicare Other | Source: Ambulatory Visit | Attending: Physician Assistant | Admitting: Physician Assistant

## 2022-12-19 VITALS — BP 126/70 | HR 52 | Wt 259.0 lb

## 2022-12-19 DIAGNOSIS — N201 Calculus of ureter: Secondary | ICD-10-CM

## 2022-12-19 DIAGNOSIS — N200 Calculus of kidney: Secondary | ICD-10-CM

## 2022-12-19 DIAGNOSIS — R109 Unspecified abdominal pain: Secondary | ICD-10-CM

## 2022-12-19 DIAGNOSIS — N2889 Other specified disorders of kidney and ureter: Secondary | ICD-10-CM | POA: Diagnosis not present

## 2022-12-19 LAB — URINALYSIS, COMPLETE
Bilirubin, UA: NEGATIVE
Glucose, UA: NEGATIVE
Leukocytes,UA: NEGATIVE
Nitrite, UA: NEGATIVE
Specific Gravity, UA: 1.025 (ref 1.005–1.030)
Urobilinogen, Ur: 1 mg/dL (ref 0.2–1.0)
pH, UA: 6 (ref 5.0–7.5)

## 2022-12-19 LAB — MICROSCOPIC EXAMINATION
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
## 2022-12-20 DIAGNOSIS — E7849 Other hyperlipidemia: Secondary | ICD-10-CM | POA: Diagnosis not present

## 2022-12-20 DIAGNOSIS — E1165 Type 2 diabetes mellitus with hyperglycemia: Secondary | ICD-10-CM | POA: Diagnosis not present

## 2022-12-20 DIAGNOSIS — E039 Hypothyroidism, unspecified: Secondary | ICD-10-CM | POA: Diagnosis not present

## 2022-12-20 DIAGNOSIS — K760 Fatty (change of) liver, not elsewhere classified: Secondary | ICD-10-CM | POA: Diagnosis not present

## 2022-12-20 DIAGNOSIS — E1122 Type 2 diabetes mellitus with diabetic chronic kidney disease: Secondary | ICD-10-CM | POA: Diagnosis not present

## 2022-12-22 LAB — CULTURE, URINE COMPREHENSIVE

## 2022-12-23 ENCOUNTER — Other Ambulatory Visit: Payer: Self-pay | Admitting: Urology

## 2022-12-23 LAB — CULTURE, URINE COMPREHENSIVE

## 2022-12-23 MED ORDER — CLINDAMYCIN HCL 300 MG PO CAPS: 300.0000 mg | ORAL_CAPSULE | Freq: Three times a day (TID) | ORAL | 0 refills | Status: DC

## 2022-12-27 DIAGNOSIS — M19171 Post-traumatic osteoarthritis, right ankle and foot: Secondary | ICD-10-CM | POA: Diagnosis not present

## 2022-12-27 DIAGNOSIS — R4582 Worries: Secondary | ICD-10-CM | POA: Diagnosis not present

## 2022-12-27 DIAGNOSIS — K7581 Nonalcoholic steatohepatitis (NASH): Secondary | ICD-10-CM | POA: Diagnosis not present

## 2022-12-27 DIAGNOSIS — K760 Fatty (change of) liver, not elsewhere classified: Secondary | ICD-10-CM | POA: Diagnosis not present

## 2022-12-27 DIAGNOSIS — I10 Essential (primary) hypertension: Secondary | ICD-10-CM | POA: Diagnosis not present

## 2022-12-27 DIAGNOSIS — Z79891 Long term (current) use of opiate analgesic: Secondary | ICD-10-CM | POA: Diagnosis not present

## 2022-12-27 DIAGNOSIS — R7301 Impaired fasting glucose: Secondary | ICD-10-CM | POA: Diagnosis not present

## 2022-12-27 DIAGNOSIS — I255 Ischemic cardiomyopathy: Secondary | ICD-10-CM | POA: Diagnosis not present

## 2022-12-27 DIAGNOSIS — E7849 Other hyperlipidemia: Secondary | ICD-10-CM | POA: Diagnosis not present

## 2022-12-27 DIAGNOSIS — I25119 Atherosclerotic heart disease of native coronary artery with unspecified angina pectoris: Secondary | ICD-10-CM | POA: Diagnosis not present

## 2022-12-27 DIAGNOSIS — Z91018 Allergy to other foods: Secondary | ICD-10-CM | POA: Diagnosis not present

## 2022-12-27 DIAGNOSIS — E039 Hypothyroidism, unspecified: Secondary | ICD-10-CM | POA: Diagnosis not present

## 2023-01-09 ENCOUNTER — Ambulatory Visit (HOSPITAL_COMMUNITY): Payer: Medicare Other

## 2023-01-09 ENCOUNTER — Ambulatory Visit
Admission: RE | Admit: 2023-01-09 | Discharge: 2023-01-09 | Disposition: A | Discharge status: Home / Self Care | Payer: Medicare Other | Source: Ambulatory Visit | Attending: Physician Assistant | Admitting: Physician Assistant

## 2023-01-09 ENCOUNTER — Other Ambulatory Visit: Payer: Medicare Other

## 2023-01-09 DIAGNOSIS — N201 Calculus of ureter: Secondary | ICD-10-CM | POA: Diagnosis not present

## 2023-01-09 DIAGNOSIS — N200 Calculus of kidney: Secondary | ICD-10-CM | POA: Diagnosis not present

## 2023-01-10 LAB — MICROSCOPIC EXAMINATION: Bacteria, UA: NONE SEEN

## 2023-01-10 LAB — URINALYSIS, COMPLETE
Bilirubin, UA: NEGATIVE
Glucose, UA: NEGATIVE
Ketones, UA: NEGATIVE
Leukocytes,UA: NEGATIVE
Nitrite, UA: NEGATIVE
RBC, UA: NEGATIVE
Specific Gravity, UA: 1.02 (ref 1.005–1.030)
Urobilinogen, Ur: 4 mg/dL — ABNORMAL HIGH (ref 0.2–1.0)
pH, UA: 7 (ref 5.0–7.5)

## 2023-01-12 ENCOUNTER — Other Ambulatory Visit (HOSPITAL_COMMUNITY): Payer: Medicare Other

## 2023-01-19 ENCOUNTER — Other Ambulatory Visit
Admission: RE | Admit: 2023-01-19 | Discharge: 2023-01-19 | Disposition: A | Discharge status: Home / Self Care | Payer: Medicare Other | Source: Ambulatory Visit | Attending: Surgery | Admitting: Surgery

## 2023-01-19 ENCOUNTER — Encounter: Payer: Self-pay | Admitting: Surgery

## 2023-01-19 ENCOUNTER — Ambulatory Visit: Payer: Medicare Other | Admitting: Surgery

## 2023-01-19 ENCOUNTER — Other Ambulatory Visit: Payer: Medicare Other

## 2023-01-19 VITALS — BP 106/68 | HR 56 | Temp 97.7°F | Ht 72.0 in | Wt 259.2 lb

## 2023-01-19 DIAGNOSIS — R630 Anorexia: Secondary | ICD-10-CM

## 2023-01-19 DIAGNOSIS — R140 Abdominal distension (gaseous): Secondary | ICD-10-CM

## 2023-01-19 DIAGNOSIS — R1032 Left lower quadrant pain: Secondary | ICD-10-CM | POA: Insufficient documentation

## 2023-01-19 DIAGNOSIS — K5900 Constipation, unspecified: Secondary | ICD-10-CM

## 2023-01-19 DIAGNOSIS — R109 Unspecified abdominal pain: Secondary | ICD-10-CM | POA: Diagnosis not present

## 2023-01-19 LAB — CBC
HCT: 46.5 % (ref 39.0–52.0)
Hemoglobin: 15.6 g/dL (ref 13.0–17.0)
MCH: 31.2 pg (ref 26.0–34.0)
MCHC: 33.5 g/dL (ref 30.0–36.0)
MCV: 93 fL (ref 80.0–100.0)
Platelets: 188 10*3/uL (ref 150–400)
RBC: 5 MIL/uL (ref 4.22–5.81)
RDW: 12.9 % (ref 11.5–15.5)
WBC: 6.8 10*3/uL (ref 4.0–10.5)
nRBC: 0 % (ref 0.0–0.2)

## 2023-01-19 LAB — COMPREHENSIVE METABOLIC PANEL
ALT: 36 U/L (ref 0–44)
AST: 32 U/L (ref 15–41)
Albumin: 2.9 g/dL — ABNORMAL LOW (ref 3.5–5.0)
Alkaline Phosphatase: 50 U/L (ref 38–126)
Anion gap: 8 (ref 5–15)
BUN: 21 mg/dL — ABNORMAL HIGH (ref 6–20)
CO2: 33 mmol/L — ABNORMAL HIGH (ref 22–32)
Calcium: 8.6 mg/dL — ABNORMAL LOW (ref 8.9–10.3)
Chloride: 98 mmol/L (ref 98–111)
Creatinine, Ser: 0.89 mg/dL (ref 0.61–1.24)
GFR, Estimated: >60 mL/min (ref >60)
Glucose, Bld: 96 mg/dL (ref 70–99)
Potassium: 3.5 mmol/L (ref 3.5–5.1)
Sodium: 139 mmol/L (ref 135–145)
Total Bilirubin: 1 mg/dL (ref 0.3–1.2)
Total Protein: 6 g/dL — ABNORMAL LOW (ref 6.5–8.1)

## 2023-01-19 NOTE — Patient Instructions (Addendum)
Advised to pursue a goal of 25 to 30 g of fiber daily.  Made aware that the majority of this may be through natural sources, but advised to be aware of actual consumption and to ensure minimal consumption by daily supplementation.  Various forms of supplements discussed.  Recommended Psyllium husk, that mixes well with applesauce, or the powder which goes down well shaken with chocolate milk.  Strongly advised to consume more fluids to ensure adequate hydration, instructed to watch color of urine to determine adequacy of hydration.  Clarity is pursued in urine output, and bowel activity that correlates to significant meal intake.   We need to avoid deferring having bowel movements, advised to take the time at the first sign of sensation, typically following meals, and in the morning.   Subsequent utilization of MiraLAX may be needed ensure at least daily movement, ideally twice daily bowel movements.  If multiple doses of MiraLAX are necessary utilize them. Never skip a day...  To be regular, we must do the above EVERY day.   Please get your labs done at [facility]. You do not need an appointment.    If you have any concerns or questions, please feel free to call our office.   Nausea, Adult Nausea is feeling like you may vomit. Feeling like you may vomit is usually not serious, but it may be an early sign of a more serious medical problem. Vomiting is when stomach contents forcefully come out of your mouth. If you vomit, or if you are not able to drink enough fluids, you may not have enough water in your body (get dehydrated). If you do not have enough water in your body, you may: Feel tired. Feel thirsty. Have a dry mouth. Have cracked lips. Pee (urinate) less often. Older adults and people who have other diseases or a weak body defense system (immune system) have a higher risk of not having enough water in the body. The main goals of treating this condition are: To relieve your  nausea. To ensure your nausea occurs less often. To prevent vomiting and losing too much fluid. Follow these instructions at home: Watch your symptoms for any changes. Tell your doctor about them. Eating and drinking     Take an ORS (oral rehydration solution). This is a drink that is sold at pharmacies and stores. Drink clear fluids in small amounts as you are able. These include: Water. Ice chips. Fruit juice that has water added (diluted fruit juice). Low-calorie sports drinks. Eat bland, easy-to-digest foods in small amounts as you are able, such as: Bananas. Applesauce. Rice. Low-fat (lean) meats. Toast. Crackers. Avoid drinking fluids that have a lot of sugar or caffeine in them. This includes energy drinks, sports drinks, and soda. Avoid alcohol. Avoid spicy or fatty foods. General instructions Take over-the-counter and prescription medicines only as told by your doctor. Rest at home while you get better. Drink enough fluid to keep your pee (urine) pale yellow. Take slow and deep breaths when you feel like you may vomit. Avoid food or things that have strong smells. Wash your hands often with soap and water for at least 20 seconds. If you cannot use soap and water, use hand sanitizer. Make sure that everyone in your home washes their hands well and often. Keep all follow-up visits. Contact a doctor if: You feel worse. You feel like you may vomit and this lasts for more than 2 days. You vomit. You are not able to drink fluids without  vomiting. You have new symptoms. You have a fever. You have a headache. You have muscle cramps. You have a rash. You have pain while peeing. You feel light-headed or dizzy. Get help right away if: You have pain in your chest, neck, arm, or jaw. You feel very weak or you faint. You have vomit that is bright red or looks like coffee grounds. You have bloody or black poop (stools) or poop that looks like tar. You have a very bad  headache, a stiff neck, or both. You have very bad pain, cramping, or bloating in your belly (abdomen). You have trouble breathing or you are breathing very quickly. Your heart is beating very quickly. Your skin feels cold and clammy. You feel confused. You have signs of losing too much water in your body, such as: Dark pee, very little pee, or no pee. Cracked lips. Dry mouth. Sunken eyes. Sleepiness. Weakness. These symptoms may be an emergency. Get help right away. Call 911. Do not wait to see if the symptoms will go away. Do not drive yourself to the hospital. Summary Nausea is feeling like you are about vomit. If you vomit, or if you are not able to drink enough fluids, you may not have enough water in your body (get dehydrated). Eat and drink what your doctor tells you. Take over-the-counter and prescription medicines only as told by your doctor. Contact a doctor right away if your symptoms get worse or you have new symptoms. Keep all follow-up visits. This information is not intended to replace advice given to you by your health care provider. Make sure you discuss any questions you have with your health care provider. Document Revised: 12/11/2020 Document Reviewed: 12/11/2020 Elsevier Patient Education  2024 [employer].
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
## 2023-01-23 ENCOUNTER — Other Ambulatory Visit: Payer: Self-pay | Admitting: Nurse Practitioner

## 2023-01-23 DIAGNOSIS — E039 Hypothyroidism, unspecified: Secondary | ICD-10-CM | POA: Diagnosis not present

## 2023-01-24 ENCOUNTER — Other Ambulatory Visit: Payer: Self-pay

## 2023-01-24 ENCOUNTER — Telehealth: Payer: Self-pay | Admitting: Internal Medicine

## 2023-01-24 DIAGNOSIS — E079 Disorder of thyroid, unspecified: Secondary | ICD-10-CM

## 2023-01-24 NOTE — Telephone Encounter (Signed)
Pt's wife calling to figure out who the Dr. recommends the pt seeing for his Endocrinologist.

## 2023-01-24 NOTE — Telephone Encounter (Signed)
Called pt spouse ok per DPR. Advised of MD response:   I don't personally know any endocrinologists in this area.  I have heard good things about Dr. Evlyn Kanner from my teammates [email or web address]    Spouse thanked me for information. Had no further questions or concerns.

## 2023-01-27 DIAGNOSIS — I10 Essential (primary) hypertension: Secondary | ICD-10-CM | POA: Diagnosis not present

## 2023-01-27 DIAGNOSIS — K7581 Nonalcoholic steatohepatitis (NASH): Secondary | ICD-10-CM | POA: Diagnosis not present

## 2023-01-27 DIAGNOSIS — N183 Chronic kidney disease, stage 3 unspecified: Secondary | ICD-10-CM | POA: Diagnosis not present

## 2023-01-27 DIAGNOSIS — Z91018 Allergy to other foods: Secondary | ICD-10-CM | POA: Diagnosis not present

## 2023-01-27 DIAGNOSIS — E1122 Type 2 diabetes mellitus with diabetic chronic kidney disease: Secondary | ICD-10-CM | POA: Diagnosis not present

## 2023-01-27 DIAGNOSIS — R7989 Other specified abnormal findings of blood chemistry: Secondary | ICD-10-CM | POA: Diagnosis not present

## 2023-01-28 LAB — LAB REPORT - SCANNED: EGFR: 101.6

## 2023-02-01 DIAGNOSIS — N183 Chronic kidney disease, stage 3 unspecified: Secondary | ICD-10-CM | POA: Diagnosis not present

## 2023-02-01 DIAGNOSIS — M25571 Pain in right ankle and joints of right foot: Secondary | ICD-10-CM | POA: Diagnosis not present

## 2023-02-01 LAB — LAB REPORT - SCANNED: EGFR: 99

## 2023-02-08 ENCOUNTER — Ambulatory Visit: Payer: Medicare Other | Admitting: Internal Medicine

## 2023-02-08 ENCOUNTER — Telehealth: Payer: Self-pay | Admitting: Internal Medicine

## 2023-02-08 ENCOUNTER — Encounter: Payer: Self-pay | Admitting: Internal Medicine

## 2023-02-08 NOTE — Telephone Encounter (Signed)
Left a message to call back.

## 2023-02-08 NOTE — Telephone Encounter (Signed)
Pt returning nurse's call. Please advise
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
## 2023-02-08 NOTE — Telephone Encounter (Signed)
Pt has dropped off bloodwork pw to be looked at. Came in for appt but had to reschedule, he was extremely early and couldn't make it to his 3 pm appt. Would like a call concerning his blood pressure meds, his bp has been running low lately per wife.  96/65 - 02/07/2023 106/67 - past reading   If you can get him in earlier than sept 30th please call pt. I have added him to a waitlist.

## 2023-02-13 DIAGNOSIS — G4733 Obstructive sleep apnea (adult) (pediatric): Secondary | ICD-10-CM | POA: Diagnosis not present

## 2023-02-15 ENCOUNTER — Other Ambulatory Visit: Payer: Self-pay
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
## 2023-02-16 ENCOUNTER — Ambulatory Visit: Payer: Medicare Other | Admitting: Physician Assistant

## 2023-02-16 ENCOUNTER — Encounter: Payer: Self-pay | Admitting: Physician Assistant

## 2023-02-16 ENCOUNTER — Other Ambulatory Visit: Payer: Self-pay

## 2023-02-16 ENCOUNTER — Ambulatory Visit: Payer: Medicare Other | Admitting: Surgery

## 2023-02-16 ENCOUNTER — Telehealth: Payer: Self-pay

## 2023-02-16 VITALS — BP 88/57 | HR 60 | Temp 97.9°F | Ht 72.0 in | Wt 266.2 lb

## 2023-02-16 DIAGNOSIS — R1032 Left lower quadrant pain: Secondary | ICD-10-CM | POA: Diagnosis not present

## 2023-02-16 DIAGNOSIS — R194 Change in bowel habit: Secondary | ICD-10-CM | POA: Diagnosis not present

## 2023-02-16 DIAGNOSIS — Z1211 Encounter for screening for malignant neoplasm of colon: Secondary | ICD-10-CM

## 2023-02-16 MED ORDER — NA SULFATE-K SULFATE-MG SULF 17.5-3.13-1.6 GM/177ML PO SOLN: 354.0000 mL | Freq: Once | ORAL | 0 refills | Status: AC

## 2023-02-16 NOTE — Telephone Encounter (Signed)
   Name: Joseph [name].  DOB: Nov 16, 1965  MRN: [id number]  Primary Cardiologist: Tonny Bollman, MD  Chart reviewed as part of pre-operative protocol coverage. The patient has an upcoming visit scheduled with Dr. Timoteo Gaul on 03/20/2023 at which time clearance can be addressed in case there are any issues that would impact surgical recommendations.  Colonoscopy is not scheduled until 03/23/2023 as below. I added preop FYI to appointment note so that provider is aware to address at time of outpatient visit.  Per office protocol the cardiology provider should forward their finalized clearance decision and recommendations regarding antiplatelet therapy to the requesting party below.    I will route this message as FYI to requesting party and remove this message from the preop box as separate preop APP input not needed at this time.   Please call with any questions.  Joylene Grapes, NP  02/16/2023, 12:11 PM

## 2023-02-16 NOTE — Telephone Encounter (Signed)
   [facility] Pre-operative Risk Assessment    Request for surgical clearance:  What type of surgery is being performed? 03/23/2023   When is this surgery scheduled? Colonoscopy    Are there any medications that need to be held prior to surgery and how long?none   Practice name and name of physician performing surgery? [facility] Dr. Tobi Bastos    What is your office phone and fax number? [phone]   Anesthesia type (None, local, MAC, general) ? General    Joseph [name] 02/16/2023, 10:15 AM  _________________________________________________________________   (provider comments below)

## 2023-02-22 DIAGNOSIS — E039 Hypothyroidism, unspecified: Secondary | ICD-10-CM | POA: Diagnosis not present

## 2023-02-23 NOTE — Telephone Encounter (Signed)
Will close this encounter.  Please see my chart encounters for more details.

## 2023-02-24 NOTE — Addendum Note (Signed)
Addended by: Macie Burows on: 02/24/2023 08:18 AM   Modules accepted: Orders

## 2023-03-02 ENCOUNTER — Telehealth: Payer: Self-pay | Admitting: Internal Medicine

## 2023-03-02 MED ORDER — METOPROLOL SUCCINATE ER 25 MG PO TB24: 25.0000 mg | ORAL_TABLET | Freq: Every day | ORAL | 3 refills | Status: DC

## 2023-03-02 MED ORDER — METOPROLOL SUCCINATE ER 25 MG PO TB24: 12.5000 mg | ORAL_TABLET | Freq: Every day | ORAL | 3 refills | Status: DC

## 2023-03-02 NOTE — Addendum Note (Signed)
Addended by: Macie Burows on: 03/02/2023 11:12 AM   Modules accepted: Orders

## 2023-03-02 NOTE — Telephone Encounter (Signed)
Called pt spoke with spouse advised of MD recommendations.  Reports pt BP down to 90/58 pt did not take metoprolol yesterday d/t BP.  Advised to continue to monitor BP.  Gave recommendations on things to do if BP is low and pt is symptomatic.  Also reports pt would like to possibly have surgery at the [facility] in [place] d/t co morbidities.

## 2023-03-02 NOTE — Telephone Encounter (Signed)
Called pharmacy advised pt is take metoprolol succinate 25 mg PO every day.  Disregard order for 12.5 mg.  Lupita Leash expresses order taken care of no further concerns at this time.

## 2023-03-02 NOTE — Telephone Encounter (Signed)
Pt c/o medication issue:  1. Name of Medication:   metoprolol succinate (TOPROL XL) 25 MG 24 hr tablet   2. How are you currently taking this medication (dosage and times per day)?  As prescribed  3. Are you having a reaction (difficulty breathing--STAT)?   4. What is your medication issue?   Caller stated wife said patient's medication has been decreased and they need to clarify the new dosage for this medication.

## 2023-03-10 ENCOUNTER — Telehealth: Payer: Self-pay

## 2023-03-10 MED ORDER — METOPROLOL SUCCINATE ER 25 MG PO TB24: 12.5000 mg | ORAL_TABLET | Freq: Two times a day (BID) | ORAL | Status: DC

## 2023-03-10 NOTE — Addendum Note (Signed)
Addended by: Bertram Millard on: 03/10/2023 02:19 PM   Modules accepted: Orders

## 2023-03-10 NOTE — Telephone Encounter (Signed)
Patient called in and stated that he has been diagnosed with Cardiomyopathy and is pending surgery in a few months. States that blood pressure has been running 80/50 even with discontinuing Amlodipine and decreasing Metoprolol. States that Cardiology wanted him to reach out to Urologist to see if Flomax could be lowered to help with blood pressure? Please Advise.

## 2023-03-11 NOTE — Telephone Encounter (Signed)
Can stop tamsulosin and send in Rx alfuzosin 10 mg daily #30/3

## 2023-03-13 ENCOUNTER — Other Ambulatory Visit: Payer: Self-pay | Admitting: *Deleted

## 2023-03-13 MED ORDER — ALFUZOSIN HCL ER 10 MG PO TB24: 10.0000 mg | ORAL_TABLET | Freq: Every day | ORAL | 3 refills | Status: DC

## 2023-03-13 NOTE — Telephone Encounter (Signed)
Advised patient wife and [place]

## 2023-03-20 ENCOUNTER — Encounter: Payer: Self-pay | Admitting: Internal Medicine

## 2023-03-20 ENCOUNTER — Ambulatory Visit: Payer: Medicare Other | Attending: Internal Medicine | Admitting: Internal Medicine

## 2023-03-20 VITALS — BP 118/80 | HR 58 | Ht 72.0 in | Wt 274.6 lb

## 2023-03-20 DIAGNOSIS — R002 Palpitations: Secondary | ICD-10-CM | POA: Diagnosis not present

## 2023-03-20 DIAGNOSIS — I301 Infective pericarditis: Secondary | ICD-10-CM

## 2023-03-20 DIAGNOSIS — I421 Obstructive hypertrophic cardiomyopathy: Secondary | ICD-10-CM | POA: Diagnosis not present

## 2023-03-20 DIAGNOSIS — R55 Syncope and collapse: Secondary | ICD-10-CM

## 2023-03-20 DIAGNOSIS — I25119 Atherosclerotic heart disease of native coronary artery with unspecified angina pectoris: Secondary | ICD-10-CM | POA: Diagnosis not present

## 2023-03-20 MED ORDER — METOPROLOL SUCCINATE ER 25 MG PO TB24: 12.5000 mg | ORAL_TABLET | Freq: Every day | ORAL | 3 refills | Status: DC

## 2023-03-20 NOTE — Progress Notes (Signed)
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                 HR:           62 bpm. Exam Location:  [address]  Procedure: 2D Echo, Cardiac Doppler and Color Doppler  Indications:    I31.3 pericardial Effusion  History:        Patient has prior history of Echocardiogram examinations. CAD; Risk Factors:Hypertension, Sleep Apnea and HLD.  Sonographer:    Clearence Ped RCS Referring Phys: 25 MICHELLE M SWINYER  IMPRESSIONS   1. LV outflow tract velocity 4.7 m/s, peak gradient 89 mmHg (with Valsalva). There is systolic anterior motion of the mitral valve (SAM). Left ventricular ejection fraction, by estimation, is 60 to 65%. The left ventricle has normal function. The left ventricle has no regional wall motion abnormalities. There is moderate left ventricular hypertrophy. Left ventricular diastolic parameters are consistent with Grade II diastolic dysfunction (pseudonormalization). 2. Right ventricular systolic function is normal. The right ventricular size is normal. 3. The mitral valve is normal in structure. Mild mitral  rhinitis.  ROS:   Please see the history of present illness.      EKGs/Labs/Other Studies Reviewed:    The following studies were reviewed today: Cardiac Studies & Procedures   CARDIAC CATHETERIZATION  CARDIAC CATHETERIZATION 08/15/2022  Narrative 1.  Moderate nonobstructive proximal RCA stenosis of 50 to 60%, RFR evaluation performed and is equal to 0.95 2.  Patent left main, LAD, and left circumflex with mild in-stent restenosis in the mid LAD stent, no obstruction in the left main or circumflex 3.  Essentially normal right heart pressures, normal LVEDP of 14 mmHg, mean PA pressure 22 mmHg, RA pressure mean of 5.  Cardiac output 6.5, cardiac index 2.7. 4.  No significant pullback gradient from the mid left ventricle to the aorta with a peak gradient of 5 mmHg  Recommend: Medical therapy for nonobstructive CAD, continue medical therapy for potential hypertrophic cardiomyopathy.  Refer to HCM specialist for discussion of further management options.  Of note, patient had a large gradient by echo assessment not demonstrated by catheter pullback.  Findings Coronary Findings Diagnostic  Dominance: Right  Left Main The vessel exhibits minimal luminal irregularities. Mid LM lesion is 25% stenosed. The lesion is eccentric. The lesion is mildly calcified.  Left Anterior Descending There is mild diffuse disease throughout the vessel. Prox LAD to Mid LAD lesion is 40% stenosed. The lesion was previously treated over 2 years ago.  Left Circumflex The vessel exhibits minimal luminal irregularities.  Right Coronary Artery Vessel is large. There is mild diffuse disease throughout the vessel. Prox RCA lesion is 60% stenosed. The lesion is eccentric. The lesion is moderately calcified.  Intervention  No interventions have been documented.     ECHOCARDIOGRAM  ECHOCARDIOGRAM COMPLETE  06/28/2022  Narrative ECHOCARDIOGRAM REPORT    Patient Name:   Joseph [name]. Date of Exam: 06/28/2022 Medical Rec #:  [id number]         Height:       72.0 in Accession #:    [id number]        Weight:       274.0 lb Date of Birth:  1965-07-12          BSA:          2.436 m Patient Age:    57 years          BP:           136/75 mmHg Patient Gender: M                 HR:           62 bpm. Exam Location:  [address]  Procedure: 2D Echo, Cardiac Doppler and Color Doppler  Indications:    I31.3 pericardial Effusion  History:        Patient has prior history of Echocardiogram examinations. CAD; Risk Factors:Hypertension, Sleep Apnea and HLD.  Sonographer:    Clearence Ped RCS Referring Phys: 25 MICHELLE M SWINYER  IMPRESSIONS   1. LV outflow tract velocity 4.7 m/s, peak gradient 89 mmHg (with Valsalva). There is systolic anterior motion of the mitral valve (SAM). Left ventricular ejection fraction, by estimation, is 60 to 65%. The left ventricle has normal function. The left ventricle has no regional wall motion abnormalities. There is moderate left ventricular hypertrophy. Left ventricular diastolic parameters are consistent with Grade II diastolic dysfunction (pseudonormalization). 2. Right ventricular systolic function is normal. The right ventricular size is normal. 3. The mitral valve is normal in structure. Mild mitral

## 2023-03-20 NOTE — Patient Instructions (Addendum)
Medication Instructions:  Your physician has recommended you make the following change in your medication:  STOP: chlorthalidone  DECREASE: metoprolol succinate to 12.5 mg by mouth daily at bedtime.   *If you need a refill on your cardiac medications before your next appointment, please call your pharmacy*   Lab Work: NONE If you have labs (blood work) drawn today and your tests are completely normal, you will receive your results only by: MyChart Message (if you have MyChart) OR A paper copy in the mail If you have any lab test that is abnormal or we need to change your treatment, we will call you to review the results.   Testing/Procedures: Your physician has requested that you have a TEE. During a TEE, sound waves are used to create images of your heart. It provides your doctor with information about the size and shape of your heart and how well your heart's chambers and valves are working. In this test, a transducer is attached to the end of a flexible tube that's guided down your throat and into your esophagus (the tube leading from you mouth to your stomach) to get a more detailed image of your heart. You are not awake for the procedure. Please see the instruction sheet given to you today. For further information please visit [email or web address]     Follow-Up: At [facility], you and your health needs are our priority.  As part of our continuing mission to provide you with exceptional heart care, we have created designated Provider Care Teams.  These Care Teams include your primary Cardiologist (physician) and Advanced Practice Providers (APPs -  Physician Assistants and Nurse Practitioners) who all work together to provide you with the care you need, when you need it.   Provider:   Riley Lam, MD  Other Instructions     Dear Joseph [name].  You are scheduled for a TEE (Transesophageal Echocardiogram) on Friday, October 25 with Dr. Izora Ribas.  Please  arrive at the [facility] (Main Entrance A) at [facility]: [address] [place], [place] [address] at 10:30 am (This time is 1 hour(s) before your procedure to ensure your preparation). Free valet parking service is available. You will check in at ADMITTING. The support person will be asked to wait in the waiting room.  It is OK to have someone drop you off and come back when you are ready to be discharged.     DIET:  Nothing to eat or drink after midnight except a sip of water with medications (see medication instructions below)  MEDICATION INSTRUCTIONS: !!IF ANY NEW MEDICATIONS ARE STARTED AFTER TODAY, PLEASE NOTIFY YOUR PROVIDER AS SOON AS POSSIBLE!!  FYI: Medications such as Semaglutide (Ozempic, [place]), Tirzepatide (Mounjaro, Zepbound), Dulaglutide (Trulicity), etc ("GLP1 agonists") AND Canagliflozin (Invokana), Dapagliflozin (Farxiga), Empagliflozin (Jardiance), Ertugliflozin (Steglatro), Bexagliflozin [employer]) or any combination with one of these drugs such as Invokamet (Canagliflozin/Metformin), Synjardy (Empagliflozin/Metformin), etc ("SGLT2 inhibitors") must be held around the time of a procedure. This is not a comprehensive list of all of these drugs. Please review all of your medications and talk to your provider if you take any one of these. If you are not sure, ask your provider.   LABS: Your labs will be done at the hospital prior to your procedure - you will need to arrive 1 and 1/2 hours prior to your procedure.  FYI:  For your safety, and to allow [place] to monitor your vital signs accurately during the surgery/procedure we request: If  you have artificial nails, gel coating, SNS etc, please have those removed prior to your surgery/procedure. Not having the nail coverings /polish removed may result in cancellation or delay of your surgery/procedure.  You must have a responsible person to drive you home and stay in the waiting area during your procedure. Failure to do so  could result in cancellation.  Bring your insurance cards.  *Special Note: Every effort is made to have your procedure done on time. Occasionally there are emergencies that occur at the hospital that may cause delays. Please be patient if a delay does occur.

## 2023-03-20 NOTE — H&P (View-Only) (Signed)
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 If you have labs (blood work) drawn today and your tests are completely normal, you will receive your results only by: MyChart Message (if you have MyChart) OR A paper copy in the mail If you have any lab test that is abnormal or we need to change your treatment, we will call you to review the results.   Testing/Procedures: Your physician has requested that you have a TEE. During a TEE, sound waves are used to create images of your heart. It provides your doctor with information about the size and shape of your heart and how well your heart's chambers and valves are working. In this test, a transducer is attached to the end of a flexible tube that's guided down your  throat and into your esophagus (the tube leading from you mouth to your stomach) to get a more detailed image of your heart. You are not awake for the procedure. Please see the instruction sheet given to you today. For further information please visit [email or web address]     Follow-Up: At [facility], you and your health needs are our priority.  As part of our continuing mission to provide you with exceptional heart care, we have created designated Provider Care Teams.  These Care Teams include your primary Cardiologist (physician) and Advanced Practice Providers (APPs -  Physician Assistants and Nurse Practitioners) who all work together to provide you with the care you need, when you need it.   Provider:   Riley Lam, MD  Other Instructions     Dear Joseph Barter.  You are scheduled for a TEE (Transesophageal Echocardiogram) on Friday, October 25 with Dr. Izora Ribas.  Please arrive at the [facility] (Main Entrance A) at [facility]: [address] [place], [place] [address] at 10:30 am (This time is 1 hour(s) before your procedure to ensure your preparation). Free valet parking service is available. You will check in at ADMITTING. The support person will be asked to wait in the waiting room.  It is OK to have someone drop you off and come back when you are ready to be discharged.     DIET:  Nothing to eat or drink after midnight except a sip of water with medications (see medication instructions below)  MEDICATION INSTRUCTIONS: !!IF ANY NEW MEDICATIONS ARE STARTED AFTER TODAY, PLEASE NOTIFY YOUR PROVIDER AS SOON AS POSSIBLE!!  FYI: Medications such as Semaglutide (Ozempic, [place]), Tirzepatide (Mounjaro, Zepbound), Dulaglutide (Trulicity), etc ("GLP1 agonists") AND Canagliflozin (Invokana), Dapagliflozin (Farxiga), Empagliflozin (Jardiance), Ertugliflozin (Steglatro), Bexagliflozin [employer]) or any combination with one of these drugs such as Invokamet  (Canagliflozin/Metformin), Synjardy (Empagliflozin/Metformin), etc ("SGLT2 inhibitors") must be held around the time of a procedure. This is not a comprehensive list of all of these drugs. Please review all of your medications and talk to your provider if you take any one of these. If you are not sure, ask your provider.   LABS: Your labs will be done at the hospital prior to your procedure - you will need to arrive 1 and 1/2 hours prior to your procedure.  FYI:  For your safety, and to allow [place] to monitor your vital signs accurately during the surgery/procedure we request: If you have artificial nails, gel coating, SNS etc, please have those removed prior to your surgery/procedure. Not having the nail coverings /polish removed may result in cancellation or delay of your surgery/procedure.  You must have a responsible person to drive you home and stay in the waiting area during your procedure. Failure to do so could result in cancellation.  Bring your insurance cards.  *Special Note: Every effort is made to have your procedure done on time. Occasionally there are emergencies that occur at the hospital that may cause delays. Please be patient if a delay does occur.        Signed, Christell Constant, MD  03/20/2023 7:39 PM    [facility]

## 2023-03-21 ENCOUNTER — Ambulatory Visit: Payer: Medicare Other | Admitting: Physician Assistant

## 2023-03-21 ENCOUNTER — Telehealth: Payer: Self-pay

## 2023-03-21 ENCOUNTER — Telehealth: Payer: Self-pay | Admitting: Gastroenterology

## 2023-03-21 NOTE — Telephone Encounter (Signed)
Joseph [name] pt wife called to cancel  procedure for 03/23/2023 because cardiologist in not giving clearance at this time pt will call back to reschedule at a later date

## 2023-03-21 NOTE — Telephone Encounter (Signed)
Called Endo unit and cancelled procedures.   pt wife called to cancel  procedure for 03/23/2023 because cardiologist in not giving clearance at this time pt will call back to reschedule at a later date

## 2023-03-23 ENCOUNTER — Ambulatory Visit: Admission: RE | Admit: 2023-03-23 | Payer: Medicare Other | Source: Home / Self Care | Admitting: Gastroenterology

## 2023-03-23 SURGERY — COLONOSCOPY WITH PROPOFOL
Anesthesia: General

## 2023-03-30 ENCOUNTER — Telehealth: Payer: Self-pay | Admitting: Internal Medicine

## 2023-03-30 NOTE — Telephone Encounter (Signed)
Erica with [facility] is following up on referral from Dr. Ronnie Doss to them. She states she is still waiting on powershare of Echo ,MRI and heart cath. Please advise.

## 2023-03-30 NOTE — Telephone Encounter (Signed)
Spoke with Alcario Drought and and she stated provider was supposed to send patient's film through power share. She would like to know were you able to send his imaging over?

## 2023-03-31 DIAGNOSIS — E039 Hypothyroidism, unspecified: Secondary | ICD-10-CM | POA: Diagnosis not present

## 2023-03-31 DIAGNOSIS — E7849 Other hyperlipidemia: Secondary | ICD-10-CM | POA: Diagnosis not present

## 2023-03-31 DIAGNOSIS — E1165 Type 2 diabetes mellitus with hyperglycemia: Secondary | ICD-10-CM | POA: Diagnosis not present

## 2023-03-31 DIAGNOSIS — N183 Chronic kidney disease, stage 3 unspecified: Secondary | ICD-10-CM | POA: Diagnosis not present

## 2023-03-31 DIAGNOSIS — R7989 Other specified abnormal findings of blood chemistry: Secondary | ICD-10-CM | POA: Diagnosis not present

## 2023-03-31 LAB — COMPREHENSIVE METABOLIC PANEL WITH GFR
Albumin: 2.8 — AB (ref 3.5–5.0)
eGFR: 99.3

## 2023-03-31 LAB — BASIC METABOLIC PANEL WITH GFR
BUN: 20 (ref 4–21)
CO2: 34 — AB (ref 13–22)
Creatinine: 0.9 (ref 0.6–1.3)
Glucose: 97

## 2023-03-31 LAB — TSH: TSH: 7.6 — AB (ref 0.41–5.90)

## 2023-03-31 LAB — LIPID PANEL
LDL Cholesterol: 130
Triglycerides: 373 — AB (ref 40–160)

## 2023-03-31 LAB — HEMOGLOBIN A1C: Hemoglobin A1C: 5.8

## 2023-03-31 NOTE — Telephone Encounter (Signed)
Unfortunately, [facility] does not currently have access to powershare to [facility].   Imaging obtained on CDs from appropriate departments and will FedEx from the [facility] office this afternoon.

## 2023-03-31 NOTE — Telephone Encounter (Signed)
Received needed disc of imaging from [facility].  Placed to be mailed out via Fed-EX to: Dr. Deneise Lever, [facility] [address] [place], [place] [address]. Address provided by [facility] with [facility].  Tracking number [id number].

## 2023-04-01 ENCOUNTER — Encounter: Payer: Self-pay | Admitting: Internal Medicine

## 2023-04-05 DIAGNOSIS — R7301 Impaired fasting glucose: Secondary | ICD-10-CM | POA: Diagnosis not present

## 2023-04-05 DIAGNOSIS — K7581 Nonalcoholic steatohepatitis (NASH): Secondary | ICD-10-CM | POA: Diagnosis not present

## 2023-04-05 DIAGNOSIS — I255 Ischemic cardiomyopathy: Secondary | ICD-10-CM | POA: Diagnosis not present

## 2023-04-05 DIAGNOSIS — I10 Essential (primary) hypertension: Secondary | ICD-10-CM | POA: Diagnosis not present

## 2023-04-05 DIAGNOSIS — I25119 Atherosclerotic heart disease of native coronary artery with unspecified angina pectoris: Secondary | ICD-10-CM | POA: Diagnosis not present

## 2023-04-05 DIAGNOSIS — Z91018 Allergy to other foods: Secondary | ICD-10-CM | POA: Diagnosis not present

## 2023-04-05 DIAGNOSIS — G4733 Obstructive sleep apnea (adult) (pediatric): Secondary | ICD-10-CM | POA: Diagnosis not present

## 2023-04-05 DIAGNOSIS — E7849 Other hyperlipidemia: Secondary | ICD-10-CM | POA: Diagnosis not present

## 2023-04-05 DIAGNOSIS — M19171 Post-traumatic osteoarthritis, right ankle and foot: Secondary | ICD-10-CM | POA: Diagnosis not present

## 2023-04-05 DIAGNOSIS — Z79891 Long term (current) use of opiate analgesic: Secondary | ICD-10-CM | POA: Diagnosis not present

## 2023-04-05 DIAGNOSIS — R4582 Worries: Secondary | ICD-10-CM | POA: Diagnosis not present

## 2023-04-05 DIAGNOSIS — K760 Fatty (change of) liver, not elsewhere classified: Secondary | ICD-10-CM | POA: Diagnosis not present

## 2023-04-07 NOTE — Telephone Encounter (Signed)
Disc delivered by FED-EX checked [email or web address] status:  Delivered [place], [place] Delivered  04/03/2023 at 9:52 AM

## 2023-04-07 NOTE — Telephone Encounter (Signed)
Tracking number was [id number].

## 2023-04-10 ENCOUNTER — Encounter: Payer: Self-pay | Admitting: Nurse Practitioner

## 2023-04-10 ENCOUNTER — Ambulatory Visit: Payer: Medicare Other | Admitting: Nurse Practitioner

## 2023-04-10 VITALS — BP 124/74 | HR 70 | Ht 72.0 in | Wt 276.2 lb

## 2023-04-10 DIAGNOSIS — E039 Hypothyroidism, unspecified: Secondary | ICD-10-CM
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
## 2023-04-10 NOTE — Patient Instructions (Signed)

## 2023-04-13 ENCOUNTER — Telehealth: Payer: Self-pay | Admitting: Internal Medicine

## 2023-04-13 NOTE — Telephone Encounter (Signed)
Wife is calling to see what the patient can eat or not eat before procedure tomorrow. Please advise

## 2023-04-13 NOTE — Telephone Encounter (Signed)
Called spouse advised TEE is scheduled for 11 am and to arrive by 9:45 am to Main Entrance A.  NPO after midnight may take meds with sips of water only.  No further questions at this time.

## 2023-04-13 NOTE — OR Nursing (Signed)
Called patient with pre-procedure instructions for tomorrow.   Patient informed of:   Time to arrive for procedure. 0945 Remain NPO past midnight.  Must have a ride home and a responsible adult to remain with them for 24 hours post procedure.  Confirmed blood thinner. Confirmed no breaks in taking blood thinner for 3+ weeks prior to procedure. Confirmed patient stopped all GLP-1s and GLP-2s for at least one week before procedure.   Spoke with patient's wife regarding above information.

## 2023-04-14 ENCOUNTER — Encounter (HOSPITAL_COMMUNITY)
Admission: RE | Disposition: A | Discharge status: Home / Self Care | Payer: Self-pay | Source: Home / Self Care | Attending: Internal Medicine

## 2023-04-14 ENCOUNTER — Encounter (HOSPITAL_COMMUNITY): Payer: Self-pay | Admitting: Internal Medicine

## 2023-04-14 ENCOUNTER — Ambulatory Visit (HOSPITAL_COMMUNITY)
Admission: RE | Admit: 2023-04-14 | Discharge: 2023-04-14 | Disposition: A | Discharge status: Home / Self Care | Payer: Medicare Other | Attending: Internal Medicine | Admitting: Internal Medicine

## 2023-04-14 ENCOUNTER — Ambulatory Visit (HOSPITAL_BASED_OUTPATIENT_CLINIC_OR_DEPARTMENT_OTHER): Payer: Medicare Other | Admitting: Anesthesiology

## 2023-04-14 ENCOUNTER — Ambulatory Visit (HOSPITAL_BASED_OUTPATIENT_CLINIC_OR_DEPARTMENT_OTHER)
Admission: RE | Admit: 2023-04-14 | Discharge: 2023-04-14 | Disposition: A | Discharge status: Home / Self Care | Payer: Medicare Other | Source: Ambulatory Visit | Attending: Internal Medicine | Admitting: Internal Medicine

## 2023-04-14 ENCOUNTER — Ambulatory Visit (HOSPITAL_COMMUNITY): Payer: Medicare Other | Admitting: Anesthesiology

## 2023-04-14 DIAGNOSIS — I340 Nonrheumatic mitral (valve) insufficiency: Secondary | ICD-10-CM

## 2023-04-14 DIAGNOSIS — I25119 Atherosclerotic heart disease of native coronary artery with unspecified angina pectoris: Secondary | ICD-10-CM | POA: Insufficient documentation

## 2023-04-14 DIAGNOSIS — F419 Anxiety disorder, unspecified: Secondary | ICD-10-CM | POA: Insufficient documentation

## 2023-04-14 DIAGNOSIS — Z955 Presence of coronary angioplasty implant and graft: Secondary | ICD-10-CM | POA: Insufficient documentation

## 2023-04-14 DIAGNOSIS — I2510 Atherosclerotic heart disease of native coronary artery without angina pectoris: Secondary | ICD-10-CM | POA: Diagnosis not present

## 2023-04-14 DIAGNOSIS — G4733 Obstructive sleep apnea (adult) (pediatric): Secondary | ICD-10-CM | POA: Insufficient documentation

## 2023-04-14 DIAGNOSIS — E039 Hypothyroidism, unspecified: Secondary | ICD-10-CM | POA: Insufficient documentation

## 2023-04-14 DIAGNOSIS — I421 Obstructive hypertrophic cardiomyopathy: Secondary | ICD-10-CM

## 2023-04-14 DIAGNOSIS — M545 Low back pain, unspecified: Secondary | ICD-10-CM | POA: Diagnosis not present

## 2023-04-14 DIAGNOSIS — Z87891 Personal history of nicotine dependence: Secondary | ICD-10-CM | POA: Insufficient documentation

## 2023-04-14 DIAGNOSIS — I10 Essential (primary) hypertension: Secondary | ICD-10-CM | POA: Diagnosis not present

## 2023-04-14 DIAGNOSIS — I447 Left bundle-branch block, unspecified: Secondary | ICD-10-CM | POA: Diagnosis not present

## 2023-04-14 DIAGNOSIS — I301 Infective pericarditis: Secondary | ICD-10-CM | POA: Diagnosis not present

## 2023-04-14 DIAGNOSIS — R002 Palpitations: Secondary | ICD-10-CM | POA: Insufficient documentation

## 2023-04-14 DIAGNOSIS — I422 Other hypertrophic cardiomyopathy: Secondary | ICD-10-CM

## 2023-04-14 DIAGNOSIS — Z79899 Other long term (current) drug therapy: Secondary | ICD-10-CM | POA: Diagnosis not present

## 2023-04-14 DIAGNOSIS — G894 Chronic pain syndrome: Secondary | ICD-10-CM | POA: Insufficient documentation

## 2023-04-14 DIAGNOSIS — R55 Syncope and collapse: Secondary | ICD-10-CM | POA: Insufficient documentation

## 2023-04-14 HISTORY — PX: TEE WITHOUT CARDIOVERSION: SHX5443

## 2023-04-14 LAB — POCT I-STAT, CHEM 8
BUN: 14 mg/dL (ref 6–20)
Calcium, Ion: 1.19 mmol/L (ref 1.15–1.40)
Chloride: 102 mmol/L (ref 98–111)
Creatinine, Ser: 0.7 mg/dL (ref 0.61–1.24)
Glucose, Bld: 99 mg/dL (ref 70–99)
HCT: 43 % (ref 39.0–52.0)
Hemoglobin: 14.6 g/dL (ref 13.0–17.0)
Potassium: 3.9 mmol/L (ref 3.5–5.1)
Sodium: 140 mmol/L (ref 135–145)
TCO2: 29 mmol/L (ref 22–32)

## 2023-04-14 SURGERY — ECHOCARDIOGRAM, TRANSESOPHAGEAL
Anesthesia: Monitor Anesthesia Care

## 2023-04-14 MED ORDER — LIDOCAINE 2% (20 MG/ML) 5 ML SYRINGE
INTRAMUSCULAR | Status: DC | PRN
  Administered 2023-04-14: 100 mg via INTRAVENOUS

## 2023-04-14 MED ORDER — GLYCOPYRROLATE 0.2 MG/ML IJ SOLN
INTRAMUSCULAR | Status: DC | PRN
Start: 2023-04-14 — End: 2023-04-14
  Administered 2023-04-14: .2 mg via INTRAVENOUS

## 2023-04-14 MED ORDER — SODIUM CHLORIDE 0.9 % IV SOLN: INTRAVENOUS | Status: DC

## 2023-04-14 MED ORDER — PROPOFOL 10 MG/ML IV BOLUS
INTRAVENOUS | Status: DC | PRN
  Administered 2023-04-14: 50 mg via INTRAVENOUS

## 2023-04-14 MED ORDER — PROPOFOL 500 MG/50ML IV EMUL
INTRAVENOUS | Status: DC | PRN
  Administered 2023-04-14: 200 ug/kg/min via INTRAVENOUS

## 2023-04-14 NOTE — Transfer of Care (Signed)
Immediate Anesthesia Transfer of Care Note  Patient: Joseph [name].  Procedure(s) Performed: TRANSESOPHAGEAL ECHOCARDIOGRAM  Patient Location: PACU  Anesthesia Type:MAC  Level of Consciousness: awake, alert , and oriented  Airway & Oxygen Therapy: Patient Spontanous Breathing  Post-op Assessment: Report given to RN and Post -op Vital signs reviewed and stable  Post vital signs: Reviewed and stable  Last Vitals:  Vitals Value Taken Time  BP    Temp    Pulse 60 04/14/23 1208  Resp    SpO2 92 % 04/14/23 1208  Vitals shown include unfiled device data.  Last Pain:  Vitals:   04/14/23 1206  TempSrc: Tympanic  PainSc: Asleep         Complications: No notable events documented.

## 2023-04-14 NOTE — Discharge Instructions (Signed)
TEE  YOU HAD AN CARDIAC PROCEDURE TODAY: Refer to the procedure report and other information in the discharge instructions given to you for any specific questions about what was found during the examination. If this information does not answer your questions, please call [facility] office at [phone] to clarify.   DIET: Your first meal following the procedure should be a light meal and then it is ok to progress to your normal diet. A half-sandwich or bowl of soup is an example of a good first meal. Heavy or fried foods are harder to digest and may make you feel nauseous or bloated. Drink plenty of fluids but you should avoid alcoholic beverages for 24 hours. If you had a esophageal dilation, please see attached instructions for diet.   ACTIVITY: Your care partner should take you home directly after the procedure. You should plan to take it easy, moving slowly for the rest of the day. You can resume normal activity the day after the procedure however YOU SHOULD NOT DRIVE, use power tools, machinery or perform tasks that involve climbing or major physical exertion for 24 hours (because of the sedation medicines used during the test).   SYMPTOMS TO REPORT IMMEDIATELY: A cardiologist can be reached at any hour. Please call [phone] for any of the following symptoms:  Vomiting of blood or coffee ground material  New, significant abdominal pain  New, significant chest pain or pain under the shoulder blades  Painful or persistently difficult swallowing  New shortness of breath  Black, tarry-looking or red, bloody stools  FOLLOW UP:  Please also call with any specific questions about appointments or follow up tests.   
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
## 2023-04-14 NOTE — Anesthesia Postprocedure Evaluation (Signed)
Anesthesia Post Note  Patient: Joseph [name].  Procedure(s) Performed: TRANSESOPHAGEAL ECHOCARDIOGRAM     Patient location during evaluation: PACU Anesthesia Type: MAC Level of consciousness: awake and alert Pain management: pain level controlled Vital Signs Assessment: post-procedure vital signs reviewed and stable Respiratory status: spontaneous breathing, nonlabored ventilation and respiratory function stable Cardiovascular status: blood pressure returned to baseline and stable Postop Assessment: no apparent nausea or vomiting Anesthetic complications: no   No notable events documented.  Last Vitals:  Vitals:   04/14/23 1216 04/14/23 1226  BP: (!) 133/98 (!) 133/93  Pulse: 60 60  Resp: 16 17  Temp:    SpO2: 92% 95%    Last Pain:  Vitals:   04/14/23 1226  TempSrc:   PainSc: 0-No pain                 Lowella Curb

## 2023-04-14 NOTE — CV Procedure (Addendum)
TRANSESOPHAGEAL ECHOCARDIOGRAM   NAME:  Joseph [name].    MRN: [id number] DOB:  [birth year]    ADMIT DATE: 04/14/2023  INDICATIONS: Consideration for hypertrophic cardiomyopathy interventions.  PROCEDURE:   Informed consent was obtained prior to the procedure. The risks, benefits and alternatives for the procedure were discussed and the patient comprehended these risks.  Risks include, but are not limited to, cough, sore throat, vomiting, nausea, somnolence, esophageal and stomach trauma or perforation, bleeding, low blood pressure, aspiration, pneumonia, infection, trauma to the teeth and death.    Procedural time out performed. The oropharynx was anesthetized with topical 1% benzocaine.    Anesthesia was administered by Dr. Freida Busman and team.  The patient was administered a total of Propofol 460 mg to achieve and maintain moderate to deep conscious sedation.  The patient's heart rate, blood pressure, and oxygen saturation are monitored continuously during the procedure. The period of conscious sedation is 16 minutes, of which I was present face-to-face 100% of this time.   The transesophageal probe was inserted in the esophagus and stomach without difficulty and multiple views were obtained.   COMPLICATIONS:    There were no immediate complications.  KEY FINDINGS:  Non obstructive hypertrophic cardiomyopathy (fasting/resting with Valsalva). Using 3D TEE: Elongated mitral valve- has LVOT flow acceleration and mild, central regurgitation.   Full report to follow. Further management per primary team.   Riley Lam, MD [place]  [facility]  11:59 AM

## 2023-04-14 NOTE — Interval H&P Note (Signed)
History and Physical Interval Note:  04/14/2023 11:25 AM  Joseph [name].  has presented today for surgery, with the diagnosis of hypertrophic obstructive cardiomyopathy.  The various methods of treatment have been discussed with the patient and family. After consideration of risks, benefits and other options for treatment, the patient has consented to  Procedure(s): TRANSESOPHAGEAL ECHOCARDIOGRAM (N/A) as a surgical intervention.  The patient's history has been reviewed, patient examined, no change in status, stable for surgery.  I have reviewed the patient's chart and labs.  Questions were answered to the patient's satisfaction.     Libia Fazzini A Devaney Segers

## 2023-04-17 ENCOUNTER — Encounter: Payer: Self-pay | Admitting: Internal Medicine

## 2023-04-17 ENCOUNTER — Encounter (HOSPITAL_COMMUNITY): Payer: Self-pay | Admitting: Internal Medicine

## 2023-04-17 LAB — ECHO TEE
AV Mean grad: 6 mm[Hg]
AV Peak grad: 12.4 mmHg
Ao pk vel: 1.76 m/s

## 2023-04-18 NOTE — Telephone Encounter (Signed)
Obtained TEE disc placed to be mailed via [employer].  Pick up tracking number is [id number].

## 2023-04-25 ENCOUNTER — Telehealth: Payer: Self-pay | Admitting: Nurse Practitioner

## 2023-04-25 NOTE — Telephone Encounter (Signed)
Pt notified. He will have labs this week

## 2023-04-25 NOTE — Telephone Encounter (Signed)
Yes, that is fine, go ahead and do it now.

## 2023-04-25 NOTE — Telephone Encounter (Signed)
Pts wife called wanting to see if they could do his labs earlier. He has been having tremors and excessive sweating. His CABG is scheduled for Nov 21st at the [facility]. They want to make sure these are not symptoms from his thyroid before they leave for surgery.

## 2023-04-26 DIAGNOSIS — E039 Hypothyroidism, unspecified: Secondary | ICD-10-CM | POA: Diagnosis not present

## 2023-04-27 ENCOUNTER — Telehealth: Payer: Self-pay | Admitting: *Deleted

## 2023-04-27 NOTE — Telephone Encounter (Signed)
Please call patient back.  He is no longer an active patient. I can't order any labs on him or give medical advice for inactive patients.  I have openings next week in OR if he would like to make an appointment. Okay to schedule in a follow up visit slot.   Thank you.

## 2023-04-27 NOTE — Telephone Encounter (Signed)
Joseph [name] called stating that he needs an updated Alpha Gal Panel because he is having open heart surgery in a week and a half. 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    His wife got on the phone. I checked in his chart and he has not been seen in our clinic since June 2021. I informed his wife that he is no longer considered an active patient and per [facility] policy, he would have to been seen as a new patient before we are allowed to provide any patient care. She was not happy with this and insisted that I message Dr. Selena Batten to see if she would make an exception. I told her that it was very unlikely that we would be able to provide this lab order but that his PCP can certainly order it. I did provide her with the [facility] test code so they would order the correct test.   Please advise.   Call back number: [phone]

## 2023-04-27 NOTE — Telephone Encounter (Signed)
Noted  

## 2023-04-27 NOTE — Telephone Encounter (Signed)
FYI Pt notified that some of the labs have come back. 1 is still preliminary. She was requesting [name] order an Alpha Gal lab test. They will contact the cardiologist to order this since this is needed for his cardiac surgery.

## 2023-04-27 NOTE — Telephone Encounter (Signed)
Spoke with Shalin' wife. She said she is not interested in seeing [place] for an appointment. She states that she will find another doctor that will give them the Alpha Gal lab order. I did reemphasize that it is a legality/liability issue as to why we cannot provide any care to inactive patients.

## 2023-04-28 ENCOUNTER — Other Ambulatory Visit: Payer: Self-pay | Admitting: *Deleted

## 2023-04-28 DIAGNOSIS — E039 Hypothyroidism, unspecified: Secondary | ICD-10-CM

## 2023-04-28 LAB — TSH: TSH: 7.94 u[IU]/mL — ABNORMAL HIGH (ref 0.450–4.500)

## 2023-04-28 LAB — T4, FREE: Free T4: 1.18 ng/dL (ref 0.82–1.77)

## 2023-04-28 LAB — THYROID PEROXIDASE ANTIBODY: Thyroperoxidase Ab SerPl-aCnc: 27 [IU]/mL (ref 0–34)

## 2023-04-28 LAB — THYROGLOBULIN ANTIBODY: Thyroglobulin Antibody: <1 [IU]/mL (ref 0.0–0.9)

## 2023-04-28 NOTE — Telephone Encounter (Signed)
I have talked with his wife,([name]) I gave her [name]'s recommendations.

## 2023-04-28 NOTE — Telephone Encounter (Signed)
I reviewed his recent labs and there is nothing to explain his recent tremors and sweating from a thyroid perspective.  His thyroid appears to be slightly under-active with a elevated TSH but it hasn't been a full 6 weeks since we adjusted his medications (and he started taking them properly).  Free T4 was in the middle of the normal range which is reassuring as well.  His thyroid antibodies were negative, which rules out autoimmune thyroid dysfunction.

## 2023-05-01 ENCOUNTER — Emergency Department (HOSPITAL_COMMUNITY)
Admission: EM | Admit: 2023-05-01 | Discharge: 2023-05-01 | Disposition: A | Discharge status: Home / Self Care | Payer: Medicare Other | Attending: Emergency Medicine | Admitting: Emergency Medicine

## 2023-05-01 ENCOUNTER — Other Ambulatory Visit: Payer: Self-pay

## 2023-05-01 ENCOUNTER — Emergency Department (HOSPITAL_COMMUNITY): Payer: Medicare Other

## 2023-05-01 ENCOUNTER — Encounter (HOSPITAL_COMMUNITY): Payer: Self-pay | Admitting: *Deleted

## 2023-05-01 DIAGNOSIS — S0990XA Unspecified injury of head, initial encounter: Secondary | ICD-10-CM

## 2023-05-01 DIAGNOSIS — M4802 Spinal stenosis, cervical region: Secondary | ICD-10-CM | POA: Diagnosis not present

## 2023-05-01 DIAGNOSIS — Z91018 Allergy to other foods: Secondary | ICD-10-CM | POA: Diagnosis not present

## 2023-05-01 DIAGNOSIS — Y92812 Truck as the place of occurrence of the external cause: Secondary | ICD-10-CM | POA: Diagnosis not present

## 2023-05-01 DIAGNOSIS — Z7982 Long term (current) use of aspirin: Secondary | ICD-10-CM | POA: Insufficient documentation

## 2023-05-01 DIAGNOSIS — W228XXA Striking against or struck by other objects, initial encounter: Secondary | ICD-10-CM | POA: Diagnosis not present

## 2023-05-01 DIAGNOSIS — Z79899 Other long term (current) drug therapy: Secondary | ICD-10-CM | POA: Diagnosis not present

## 2023-05-01 DIAGNOSIS — T7840XD Allergy, unspecified, subsequent encounter: Secondary | ICD-10-CM | POA: Diagnosis not present

## 2023-05-01 DIAGNOSIS — S0083XA Contusion of other part of head, initial encounter: Secondary | ICD-10-CM | POA: Diagnosis not present

## 2023-05-01 DIAGNOSIS — M5020 Other cervical disc displacement, unspecified cervical region: Secondary | ICD-10-CM | POA: Diagnosis not present

## 2023-05-01 DIAGNOSIS — M47812 Spondylosis without myelopathy or radiculopathy, cervical region: Secondary | ICD-10-CM | POA: Diagnosis not present

## 2023-05-01 DIAGNOSIS — E039 Hypothyroidism, unspecified: Secondary | ICD-10-CM | POA: Insufficient documentation

## 2023-05-01 MED ORDER — ONDANSETRON 4 MG PO TBDP
4.0000 mg | ORAL_TABLET | Freq: Once | ORAL | Status: DC
  Filled 2023-05-01: qty 1

## 2023-05-01 NOTE — ED Provider Notes (Signed)
[place] EMERGENCY DEPARTMENT AT [facility] Provider Note   CSN: [id number] Arrival date & time: 05/01/23  4098     History Chief Complaint  Patient presents with   Head Injury    Joseph [name]. is a 57 y.o. male with h/o chronic back pain, HOCM, hypothyroidism presents to the ER for evaluation of head injury. The patient reports that earlier today, he was laoding a battery onto a dolly/hand truck and the hand truck handle came and hit him in the head. He denies any LOC and reports he remembers the entire incident and called his wife. He is on a daily 81mg  ASA, but denies any anticoagulant. He reports some nausea, but no vomiting. He reports his chronic pain is at it's chronic state and denies any other injury. Denies any visual changes or loss. Denies any lightheadedness or dizziness.    Head Injury Associated symptoms: headache and nausea   Associated symptoms: no numbness and no vomiting        Home Medications Prior to Admission medications   Medication Sig Start Date End Date Taking? Authorizing Provider  alfuzosin (UROXATRAL) 10 MG 24 hr tablet Take 1 tablet (10 mg total) by mouth daily with breakfast. 03/13/23   Stoioff, Verna Czech, MD  Ascorbic Acid (VITAMIN C) 1000 MG tablet Take 1,000 mg by mouth daily.    [provider]  aspirin 81 MG tablet Take 81 mg by mouth daily.    [provider]  b complex vitamins capsule Take 1 capsule by mouth daily.    [provider]  Bismuth Subsalicylate (PEPTO-BISMOL) 262 MG TABS Take 524 mg by mouth daily as needed (indigestion).    [provider]  busPIRone (BUSPAR) 15 MG tablet Take 15 mg by mouth 2 (two) times daily.    [provider]  Cholecalciferol (VITAMIN D) 50 MCG (2000 UT) tablet Take 2,000 Units by mouth daily.    [provider]  diphenhydramine-acetaminophen (TYLENOL PM) 25-500 MG TABS tablet Take 2 tablets by mouth at bedtime as needed (sleep).     [provider]  EPINEPHrine (EPIPEN 2-PAK) 0.3 mg/0.3 mL IJ SOAJ injection Inject 0.3 mLs (0.3 mg total) into the muscle as needed for anaphylaxis. 12/16/19   Ellamae Sia, DO  ezetimibe (ZETIA) 10 MG tablet Take 10 mg by mouth at bedtime.    [provider]  finasteride (PROSCAR) 5 MG tablet Take 5 mg by mouth at bedtime.     [provider]  levothyroxine (SYNTHROID) 200 MCG tablet Take 200 mcg by mouth daily. 12/27/21   [provider]  metoprolol succinate (TOPROL XL) 25 MG 24 hr tablet Take 0.5 tablets (12.5 mg total) by mouth at bedtime. 03/20/23   Christell Constant, MD  Multiple Vitamins-Minerals (MULTIVITAMIN WITH MINERALS) tablet Take 1 tablet by mouth daily.    [provider]  nitroGLYCERIN (NITROSTAT) 0.4 MG SL tablet Place 0.4 mg under the tongue every 5 (five) minutes as needed for chest pain.    [provider]  Oxycodone HCl 20 MG TABS Take 1 tablet (20 mg total) by mouth as directed. Patient taking differently: Take 20 mg by mouth 5 (five) times daily as needed (pain). 01/05/22   Campbell Lerner, MD  pravastatin (PRAVACHOL) 80 MG tablet Take 80 mg by mouth daily.    [provider]      Allergies    Alpha-gal, Lisinopril, Penicillins, Gadavist [gadobutrol], Morphine, and Tetanus toxoid    Review  of Systems   Review of Systems  Constitutional:  Negative for chills and fever.  Respiratory:  Negative for shortness of breath.   Cardiovascular:  Negative for chest pain.  Gastrointestinal:  Positive for nausea. Negative for abdominal pain and vomiting.  Neurological:  Positive for headaches. Negative for dizziness, syncope and numbness.    Physical Exam Updated Vital Signs BP (!) 156/91 (BP Location: Left Arm)   Pulse (!) 55   Temp 98.3 F (36.8 C) (Oral)   Resp 14   Ht 6' (1.829 m)   Wt 122.5 kg   SpO2 92%   BMI 36.62 kg/m  Physical Exam Vitals and nursing note reviewed.  Constitutional:       General: He is not in acute distress.    Appearance: He is not ill-appearing or toxic-appearing.  HENT:     Head:     Comments: Large hematoma over the right eyebrow with bruising. No raccoon eyes or battle signs.     Right Ear: Tympanic membrane, ear canal and external ear normal.     Left Ear: Tympanic membrane, ear canal and external ear normal.     Ears:     Comments: No hemotympanums    Mouth/Throat:     Mouth: Mucous membranes are moist.  Eyes:     General: No scleral icterus.    Extraocular Movements: Extraocular movements intact.     Pupils: Pupils are equal, round, and reactive to light.  Pulmonary:     Effort: Pulmonary effort is normal. No respiratory distress.  Abdominal:     Palpations: Abdomen is soft.     Tenderness: There is no abdominal tenderness. There is no guarding or rebound.  Musculoskeletal:     Cervical back: No rigidity or tenderness.  Skin:    General: Skin is warm and dry.  Neurological:     General: No focal deficit present.     Mental Status: He is alert. Mental status is at baseline.     GCS: GCS eye subscore is 4. GCS verbal subscore is 5. GCS motor subscore is 6.     Cranial Nerves: No cranial nerve deficit, dysarthria or facial asymmetry.     Sensory: No sensory deficit.     Motor: No weakness.     Gait: Gait normal.     ED Results / Procedures / Treatments   Labs (all labs ordered are listed, but only abnormal results are displayed) Labs Reviewed - No data to display  EKG None  Radiology CT Cervical Spine Wo Contrast  Result Date: 05/01/2023 CLINICAL DATA:  Provided history: Head trauma, moderate/severe. Neck trauma, dangerous injury mechanism. Disorientation. Nausea. Possible loss of consciousness at time of accident. EXAM: CT HEAD WITHOUT CONTRAST CT CERVICAL SPINE WITHOUT CONTRAST TECHNIQUE: Multidetector CT imaging of the head and cervical spine was performed following the standard protocol without intravenous contrast. Multiplanar  CT image reconstructions of the cervical spine were also generated. RADIATION DOSE REDUCTION: This exam was performed according to the departmental dose-optimization program which includes automated exposure control, adjustment of the mA and/or kV according to patient size and/or use of iterative reconstruction technique. COMPARISON:  Head CT 02/06/2014.  Cervical spine CT 02/06/2014. FINDINGS: CT HEAD FINDINGS Brain: No age advanced or lobar predominant parenchymal atrophy. There is no acute intracranial hemorrhage. No demarcated cortical infarct. No extra-axial fluid collection. No evidence of an intracranial mass. No midline shift. Vascular: No hyperdense vessel.  Atherosclerotic calcifications Skull: No calvarial fracture or aggressive osseous lesion. Possible sinuses/Orbits:  No mass or acute finding within the imaged orbits. Trace mucosal thickening within the bilateral ethmoid and left sphenoid sinuses. Other: Right anterior scalp/forehead hematoma. CT CERVICAL SPINE FINDINGS Alignment: Nonspecific straightening of the expected cervical lordosis. No significant spondylolisthesis. Skull base and vertebrae: The basion-dental and atlanto-dental intervals are maintained.No evidence of acute fracture to the cervical spine. Soft tissues and spinal canal: No prevertebral fluid or swelling. No visible canal hematoma. Disc levels: Cervical spondylosis with multilevel disc space narrowing, disc bulges/central disc protrusions, posterior disc osteophyte complexes and uncovertebral hypertrophy. Multilevel spinal canal narrowing. Most notably at C3-C4, a posterior disc osteophyte complex contributes to at least moderate spinal canal stenosis. Multilevel bony neural foraminal narrowing. Upper chest: No consolidation within the imaged lung apices. No visible pneumothorax. IMPRESSION: CT head: 1. No evidence of an acute intracranial abnormality. 2. Right anterior scalp/forehead hematoma. CT cervical spine: 1. No evidence of  an acute cervical spine fracture. 2. Nonspecific straightening of the expected cervical lordosis. 3. Cervical spondylosis as described. At C3-C4, a posterior disc osteophyte complex contributes to at least moderate spinal canal stenosis. Electronically Signed   By: Jackey Loge D.O.   On: 05/01/2023 11:02   CT Head Wo Contrast  Result Date: 05/01/2023 CLINICAL DATA:  Provided history: Head trauma, moderate/severe. Neck trauma, dangerous injury mechanism. Disorientation. Nausea. Possible loss of consciousness at time of accident. EXAM: CT HEAD WITHOUT CONTRAST CT CERVICAL SPINE WITHOUT CONTRAST TECHNIQUE: Multidetector CT imaging of the head and cervical spine was performed following the standard protocol without intravenous contrast. Multiplanar CT image reconstructions of the cervical spine were also generated. RADIATION DOSE REDUCTION: This exam was performed according to the departmental dose-optimization program which includes automated exposure control, adjustment of the mA and/or kV according to patient size and/or use of iterative reconstruction technique. COMPARISON:  Head CT 02/06/2014.  Cervical spine CT 02/06/2014. FINDINGS: CT HEAD FINDINGS Brain: No age advanced or lobar predominant parenchymal atrophy. There is no acute intracranial hemorrhage. No demarcated cortical infarct. No extra-axial fluid collection. No evidence of an intracranial mass. No midline shift. Vascular: No hyperdense vessel.  Atherosclerotic calcifications Skull: No calvarial fracture or aggressive osseous lesion. Possible sinuses/Orbits: No mass or acute finding within the imaged orbits. Trace mucosal thickening within the bilateral ethmoid and left sphenoid sinuses. Other: Right anterior scalp/forehead hematoma. CT CERVICAL SPINE FINDINGS Alignment: Nonspecific straightening of the expected cervical lordosis. No significant spondylolisthesis. Skull base and vertebrae: The basion-dental and atlanto-dental intervals are  maintained.No evidence of acute fracture to the cervical spine. Soft tissues and spinal canal: No prevertebral fluid or swelling. No visible canal hematoma. Disc levels: Cervical spondylosis with multilevel disc space narrowing, disc bulges/central disc protrusions, posterior disc osteophyte complexes and uncovertebral hypertrophy. Multilevel spinal canal narrowing. Most notably at C3-C4, a posterior disc osteophyte complex contributes to at least moderate spinal canal stenosis. Multilevel bony neural foraminal narrowing. Upper chest: No consolidation within the imaged lung apices. No visible pneumothorax. IMPRESSION: CT head: 1. No evidence of an acute intracranial abnormality. 2. Right anterior scalp/forehead hematoma. CT cervical spine: 1. No evidence of an acute cervical spine fracture. 2. Nonspecific straightening of the expected cervical lordosis. 3. Cervical spondylosis as described. At C3-C4, a posterior disc osteophyte complex contributes to at least moderate spinal canal stenosis. Electronically Signed   By: Jackey Loge D.O.   On: 05/01/2023 11:02    Procedures Procedures   Medications Ordered in ED Medications - No data to display  ED Course/ Medical Decision Making/ A&P  Medical Decision Making Amount and/or Complexity of Data Reviewed Radiology: ordered.   57 y.o. male presents to the ER for evaluation of head injury. Differential diagnosis includes but is not limited to trauma, bleed, concussion. Vital signs mildly elevated BP at 156/91, HR at 55, otherwise unremarkable. Physical exam as noted above.   CT head and neck imaging shows IMPRESSION: CT head: 1. No evidence of an acute intracranial abnormality. 2. Right anterior scalp/forehead hematoma. CT cervical spine: 1. No evidence of an acute cervical spine fracture. 2. Nonspecific straightening of the expected cervical lordosis. 3. Cervical spondylosis as described. At C3-C4, a posterior disc osteophyte  complex contributes to at least moderate spinal canal stenosis. Per radiologist's interpretation.   On re-evaluation, the patient is standing in the room and does not look in any acute distress. He is ambulatory with independent and steady gait. Wife reports he is at baseline and has not had any confusion since being here. Possible has a concussion. Will refer him to [facility] sports medicine clinic. Recommended ice and reclining for the hematoma. This was an injury, denies any syncope. He denies any new or worsening pain. He declines pain medication for his headache. He has a benign neurological exam, ambulatory and act appropriately, and is stable for discharge home.  We discussed the results of the labs/imaging. The plan is follow-up PCP, ice, rest. We discussed strict return precautions and red flag symptoms. The patient verbalized their understanding and agrees to the plan. The patient is stable and being discharged home in good condition.  Portions of this report may have been transcribed using voice recognition software. Every effort was made to ensure accuracy; however, inadvertent computerized transcription errors may be present.  I discussed this case with my attending physician who cosigned this note including patient's presenting symptoms, physical exam, and planned diagnostics and interventions. Attending physician stated agreement with plan or made changes to plan which were implemented.   Attending physician assessed patient at bedside.   Final Clinical Impression(s) / ED Diagnoses Final diagnoses:  Injury of head, initial encounter  Traumatic hematoma of forehead, initial encounter    Rx / DC Orders ED Discharge Orders     None         Achille Rich, PA-C 05/01/23 1841    Rondel Baton, MD 05/06/23 [phone]

## 2023-05-01 NOTE — Discharge Instructions (Addendum)
You were seen in the ER for evaluation after hitting your head. You do have a scalp hematoma. I have included more information on this into the discharge paperwork for you to review. You can place ice on the area for 15 minutes every 1-2 hours. Also sleep reclined will help with the swelling. Please follow up with your PCP and/or the concussion clinic. If you have any concerns, new or worsening symptoms, please return to the ER for re-evaluation.   Contact a doctor if: These symptoms do not go away: Headaches. Dizziness. Double vision or vision changes. Trouble sleeping. Changes in mood. You have new symptoms. Get help right away if: You have sudden: Headache that is very bad. Vomiting that does not stop. Changes in the size of one of your pupils. Pupils are the black centers of your eyes. Changes in how you see (vision). More confusion or more grumpy moods. You have a seizure. Your symptoms get worse. You have a clear or bloody fluid coming from your nose or ears. These symptoms may be an emergency. Get help right away. Call 911. Do not wait to see if the symptoms will go away. Do not drive yourself to the hospital.

## 2023-05-01 NOTE — ED Notes (Signed)
Provided ice pack for patient's forehead, and knot.

## 2023-05-01 NOTE — ED Notes (Signed)
Pt requests neither side rail to be lifted because it makes him feel claustrophobic.

## 2023-05-01 NOTE — ED Triage Notes (Signed)
Pt reports he was placing a heavy battery on a hand truck and as he was bent over the handrail hit him in the head. Denies use of blood thinners. Wife reports he had a possible LOC at time of injury and was disoriented when she first arrived but is now better. Pt reports nausea as well.

## 2023-05-09 DIAGNOSIS — R001 Bradycardia, unspecified: Secondary | ICD-10-CM | POA: Diagnosis not present

## 2023-05-09 DIAGNOSIS — J90 Pleural effusion, not elsewhere classified: Secondary | ICD-10-CM | POA: Diagnosis not present

## 2023-05-09 DIAGNOSIS — I639 Cerebral infarction, unspecified: Secondary | ICD-10-CM | POA: Diagnosis not present

## 2023-05-09 DIAGNOSIS — D3501 Benign neoplasm of right adrenal gland: Secondary | ICD-10-CM | POA: Diagnosis not present

## 2023-05-09 DIAGNOSIS — Z950 Presence of cardiac pacemaker: Secondary | ICD-10-CM | POA: Diagnosis not present

## 2023-05-09 DIAGNOSIS — I447 Left bundle-branch block, unspecified: Secondary | ICD-10-CM | POA: Diagnosis not present

## 2023-05-09 DIAGNOSIS — I421 Obstructive hypertrophic cardiomyopathy: Secondary | ICD-10-CM | POA: Diagnosis not present

## 2023-05-09 DIAGNOSIS — I9581 Postprocedural hypotension: Secondary | ICD-10-CM | POA: Diagnosis not present

## 2023-05-09 DIAGNOSIS — I340 Nonrheumatic mitral (valve) insufficiency: Secondary | ICD-10-CM | POA: Diagnosis not present

## 2023-05-09 DIAGNOSIS — I3139 Other pericardial effusion (noninflammatory): Secondary | ICD-10-CM | POA: Diagnosis not present

## 2023-05-09 DIAGNOSIS — J986 Disorders of diaphragm: Secondary | ICD-10-CM | POA: Diagnosis not present

## 2023-05-09 DIAGNOSIS — J9811 Atelectasis: Secondary | ICD-10-CM | POA: Diagnosis not present

## 2023-05-09 DIAGNOSIS — L299 Pruritus, unspecified: Secondary | ICD-10-CM | POA: Diagnosis not present

## 2023-05-09 DIAGNOSIS — G8918 Other acute postprocedural pain: Secondary | ICD-10-CM | POA: Diagnosis not present

## 2023-05-09 DIAGNOSIS — I517 Cardiomegaly: Secondary | ICD-10-CM | POA: Diagnosis not present

## 2023-05-09 DIAGNOSIS — Z951 Presence of aortocoronary bypass graft: Secondary | ICD-10-CM | POA: Diagnosis not present

## 2023-05-09 DIAGNOSIS — Z01818 Encounter for other preprocedural examination: Secondary | ICD-10-CM | POA: Diagnosis not present

## 2023-05-09 DIAGNOSIS — Z7982 Long term (current) use of aspirin: Secondary | ICD-10-CM | POA: Diagnosis not present

## 2023-05-09 DIAGNOSIS — I2510 Atherosclerotic heart disease of native coronary artery without angina pectoris: Secondary | ICD-10-CM | POA: Diagnosis not present

## 2023-05-09 DIAGNOSIS — R29810 Facial weakness: Secondary | ICD-10-CM | POA: Diagnosis not present

## 2023-05-09 DIAGNOSIS — E785 Hyperlipidemia, unspecified: Secondary | ICD-10-CM | POA: Diagnosis not present

## 2023-05-09 DIAGNOSIS — Z87892 Personal history of anaphylaxis: Secondary | ICD-10-CM | POA: Diagnosis not present

## 2023-05-09 DIAGNOSIS — T781XXA Other adverse food reactions, not elsewhere classified, initial encounter: Secondary | ICD-10-CM | POA: Diagnosis not present

## 2023-05-09 DIAGNOSIS — M545 Low back pain, unspecified: Secondary | ICD-10-CM | POA: Diagnosis not present

## 2023-05-09 DIAGNOSIS — E039 Hypothyroidism, unspecified: Secondary | ICD-10-CM | POA: Diagnosis not present

## 2023-05-09 DIAGNOSIS — R0989 Other specified symptoms and signs involving the circulatory and respiratory systems: Secondary | ICD-10-CM | POA: Diagnosis not present

## 2023-05-09 DIAGNOSIS — R918 Other nonspecific abnormal finding of lung field: Secondary | ICD-10-CM | POA: Diagnosis not present

## 2023-05-09 DIAGNOSIS — R9431 Abnormal electrocardiogram [ECG] [EKG]: Secondary | ICD-10-CM | POA: Diagnosis not present

## 2023-05-09 DIAGNOSIS — T8182XA Emphysema (subcutaneous) resulting from a procedure, initial encounter: Secondary | ICD-10-CM | POA: Diagnosis not present

## 2023-05-09 DIAGNOSIS — I2584 Coronary atherosclerosis due to calcified coronary lesion: Secondary | ICD-10-CM | POA: Diagnosis not present

## 2023-05-09 DIAGNOSIS — N200 Calculus of kidney: Secondary | ICD-10-CM | POA: Diagnosis not present

## 2023-05-09 DIAGNOSIS — J810 Acute pulmonary edema: Secondary | ICD-10-CM | POA: Diagnosis not present

## 2023-05-09 DIAGNOSIS — I43 Cardiomyopathy in diseases classified elsewhere: Secondary | ICD-10-CM | POA: Diagnosis not present

## 2023-05-09 DIAGNOSIS — T82855A Stenosis of coronary artery stent, initial encounter: Secondary | ICD-10-CM | POA: Diagnosis not present

## 2023-05-09 DIAGNOSIS — Z01810 Encounter for preprocedural cardiovascular examination: Secondary | ICD-10-CM | POA: Diagnosis not present

## 2023-05-09 DIAGNOSIS — I422 Other hypertrophic cardiomyopathy: Secondary | ICD-10-CM | POA: Diagnosis not present

## 2023-05-09 DIAGNOSIS — R4701 Aphasia: Secondary | ICD-10-CM | POA: Diagnosis not present

## 2023-05-09 DIAGNOSIS — G4733 Obstructive sleep apnea (adult) (pediatric): Secondary | ICD-10-CM | POA: Diagnosis not present

## 2023-05-09 DIAGNOSIS — D3502 Benign neoplasm of left adrenal gland: Secondary | ICD-10-CM | POA: Diagnosis not present

## 2023-05-09 DIAGNOSIS — I10 Essential (primary) hypertension: Secondary | ICD-10-CM | POA: Diagnosis not present

## 2023-05-09 DIAGNOSIS — E8770 Fluid overload, unspecified: Secondary | ICD-10-CM | POA: Diagnosis not present

## 2023-05-09 DIAGNOSIS — D62 Acute posthemorrhagic anemia: Secondary | ICD-10-CM | POA: Diagnosis not present

## 2023-05-09 DIAGNOSIS — G8929 Other chronic pain: Secondary | ICD-10-CM | POA: Diagnosis not present

## 2023-05-09 DIAGNOSIS — E854 Organ-limited amyloidosis: Secondary | ICD-10-CM | POA: Diagnosis not present

## 2023-05-09 DIAGNOSIS — D696 Thrombocytopenia, unspecified: Secondary | ICD-10-CM | POA: Diagnosis not present

## 2023-05-09 DIAGNOSIS — E872 Acidosis, unspecified: Secondary | ICD-10-CM | POA: Diagnosis not present

## 2023-05-09 DIAGNOSIS — G459 Transient cerebral ischemic attack, unspecified: Secondary | ICD-10-CM | POA: Diagnosis not present

## 2023-05-09 DIAGNOSIS — D689 Coagulation defect, unspecified: Secondary | ICD-10-CM | POA: Diagnosis not present

## 2023-05-09 DIAGNOSIS — Z87891 Personal history of nicotine dependence: Secondary | ICD-10-CM | POA: Diagnosis not present

## 2023-05-09 DIAGNOSIS — Z9889 Other specified postprocedural states: Secondary | ICD-10-CM | POA: Diagnosis not present

## 2023-05-10 DIAGNOSIS — Z01818 Encounter for other preprocedural examination: Secondary | ICD-10-CM | POA: Diagnosis not present

## 2023-05-10 DIAGNOSIS — I10 Essential (primary) hypertension: Secondary | ICD-10-CM | POA: Diagnosis not present

## 2023-05-10 DIAGNOSIS — I421 Obstructive hypertrophic cardiomyopathy: Secondary | ICD-10-CM | POA: Diagnosis not present

## 2023-05-10 DIAGNOSIS — Z01810 Encounter for preprocedural cardiovascular examination: Secondary | ICD-10-CM | POA: Diagnosis not present

## 2023-05-10 DIAGNOSIS — N200 Calculus of kidney: Secondary | ICD-10-CM | POA: Diagnosis not present

## 2023-05-10 DIAGNOSIS — R918 Other nonspecific abnormal finding of lung field: Secondary | ICD-10-CM | POA: Diagnosis not present

## 2023-05-10 DIAGNOSIS — Z87891 Personal history of nicotine dependence: Secondary | ICD-10-CM | POA: Diagnosis not present

## 2023-05-10 DIAGNOSIS — I2510 Atherosclerotic heart disease of native coronary artery without angina pectoris: Secondary | ICD-10-CM | POA: Diagnosis not present

## 2023-05-10 DIAGNOSIS — D3502 Benign neoplasm of left adrenal gland: Secondary | ICD-10-CM | POA: Diagnosis not present

## 2023-05-10 DIAGNOSIS — D3501 Benign neoplasm of right adrenal gland: Secondary | ICD-10-CM | POA: Diagnosis not present

## 2023-05-11 ENCOUNTER — Encounter: Payer: Self-pay | Admitting: Internal Medicine

## 2023-05-11 DIAGNOSIS — D62 Acute posthemorrhagic anemia: Secondary | ICD-10-CM | POA: Diagnosis not present

## 2023-05-11 DIAGNOSIS — D689 Coagulation defect, unspecified: Secondary | ICD-10-CM | POA: Diagnosis not present

## 2023-05-11 DIAGNOSIS — I340 Nonrheumatic mitral (valve) insufficiency: Secondary | ICD-10-CM | POA: Diagnosis not present

## 2023-05-11 DIAGNOSIS — E785 Hyperlipidemia, unspecified: Secondary | ICD-10-CM | POA: Diagnosis not present

## 2023-05-11 DIAGNOSIS — E039 Hypothyroidism, unspecified: Secondary | ICD-10-CM | POA: Diagnosis not present

## 2023-05-11 DIAGNOSIS — R739 Hyperglycemia, unspecified: Secondary | ICD-10-CM | POA: Diagnosis not present

## 2023-05-11 DIAGNOSIS — I422 Other hypertrophic cardiomyopathy: Secondary | ICD-10-CM | POA: Diagnosis not present

## 2023-05-11 DIAGNOSIS — I517 Cardiomegaly: Secondary | ICD-10-CM | POA: Diagnosis not present

## 2023-05-11 DIAGNOSIS — I10 Essential (primary) hypertension: Secondary | ICD-10-CM | POA: Diagnosis not present

## 2023-05-11 DIAGNOSIS — D696 Thrombocytopenia, unspecified: Secondary | ICD-10-CM | POA: Diagnosis not present

## 2023-05-11 DIAGNOSIS — Z9889 Other specified postprocedural states: Secondary | ICD-10-CM | POA: Diagnosis not present

## 2023-05-11 DIAGNOSIS — I2510 Atherosclerotic heart disease of native coronary artery without angina pectoris: Secondary | ICD-10-CM | POA: Diagnosis not present

## 2023-05-11 DIAGNOSIS — I2584 Coronary atherosclerosis due to calcified coronary lesion: Secondary | ICD-10-CM | POA: Diagnosis not present

## 2023-05-11 DIAGNOSIS — G8918 Other acute postprocedural pain: Secondary | ICD-10-CM | POA: Diagnosis not present

## 2023-05-11 DIAGNOSIS — I421 Obstructive hypertrophic cardiomyopathy: Secondary | ICD-10-CM | POA: Diagnosis not present

## 2023-05-12 DIAGNOSIS — I10 Essential (primary) hypertension: Secondary | ICD-10-CM | POA: Diagnosis not present

## 2023-05-12 DIAGNOSIS — J90 Pleural effusion, not elsewhere classified: Secondary | ICD-10-CM | POA: Diagnosis not present

## 2023-05-12 DIAGNOSIS — D696 Thrombocytopenia, unspecified: Secondary | ICD-10-CM | POA: Diagnosis not present

## 2023-05-12 DIAGNOSIS — E039 Hypothyroidism, unspecified: Secondary | ICD-10-CM | POA: Diagnosis not present

## 2023-05-12 DIAGNOSIS — I422 Other hypertrophic cardiomyopathy: Secondary | ICD-10-CM | POA: Diagnosis not present

## 2023-05-12 DIAGNOSIS — Z951 Presence of aortocoronary bypass graft: Secondary | ICD-10-CM | POA: Diagnosis not present

## 2023-05-12 DIAGNOSIS — I2510 Atherosclerotic heart disease of native coronary artery without angina pectoris: Secondary | ICD-10-CM | POA: Diagnosis not present

## 2023-05-12 DIAGNOSIS — I421 Obstructive hypertrophic cardiomyopathy: Secondary | ICD-10-CM | POA: Diagnosis not present

## 2023-05-12 DIAGNOSIS — G8918 Other acute postprocedural pain: Secondary | ICD-10-CM | POA: Diagnosis not present

## 2023-05-12 DIAGNOSIS — R739 Hyperglycemia, unspecified: Secondary | ICD-10-CM | POA: Diagnosis not present

## 2023-05-12 DIAGNOSIS — D62 Acute posthemorrhagic anemia: Secondary | ICD-10-CM | POA: Diagnosis not present

## 2023-05-12 DIAGNOSIS — D689 Coagulation defect, unspecified: Secondary | ICD-10-CM | POA: Diagnosis not present

## 2023-05-12 DIAGNOSIS — T8182XA Emphysema (subcutaneous) resulting from a procedure, initial encounter: Secondary | ICD-10-CM | POA: Diagnosis not present

## 2023-05-12 DIAGNOSIS — E785 Hyperlipidemia, unspecified: Secondary | ICD-10-CM | POA: Diagnosis not present

## 2023-05-12 DIAGNOSIS — G459 Transient cerebral ischemic attack, unspecified: Secondary | ICD-10-CM | POA: Diagnosis not present

## 2023-05-13 DIAGNOSIS — I421 Obstructive hypertrophic cardiomyopathy: Secondary | ICD-10-CM | POA: Diagnosis not present

## 2023-05-14 DIAGNOSIS — I421 Obstructive hypertrophic cardiomyopathy: Secondary | ICD-10-CM | POA: Diagnosis not present

## 2023-05-15 DIAGNOSIS — I2510 Atherosclerotic heart disease of native coronary artery without angina pectoris: Secondary | ICD-10-CM | POA: Diagnosis not present

## 2023-05-15 DIAGNOSIS — I421 Obstructive hypertrophic cardiomyopathy: Secondary | ICD-10-CM | POA: Diagnosis not present

## 2023-05-15 DIAGNOSIS — I3139 Other pericardial effusion (noninflammatory): Secondary | ICD-10-CM | POA: Diagnosis not present

## 2023-05-19 DIAGNOSIS — I2510 Atherosclerotic heart disease of native coronary artery without angina pectoris: Secondary | ICD-10-CM | POA: Diagnosis not present

## 2023-05-19 DIAGNOSIS — I10 Essential (primary) hypertension: Secondary | ICD-10-CM | POA: Diagnosis not present

## 2023-05-19 DIAGNOSIS — I440 Atrioventricular block, first degree: Secondary | ICD-10-CM | POA: Diagnosis not present

## 2023-05-19 DIAGNOSIS — I421 Obstructive hypertrophic cardiomyopathy: Secondary | ICD-10-CM | POA: Diagnosis not present

## 2023-05-19 DIAGNOSIS — M7989 Other specified soft tissue disorders: Secondary | ICD-10-CM | POA: Diagnosis not present

## 2023-05-19 DIAGNOSIS — I3139 Other pericardial effusion (noninflammatory): Secondary | ICD-10-CM | POA: Diagnosis not present

## 2023-05-19 DIAGNOSIS — T781XXA Other adverse food reactions, not elsewhere classified, initial encounter: Secondary | ICD-10-CM | POA: Diagnosis not present

## 2023-05-19 DIAGNOSIS — D62 Acute posthemorrhagic anemia: Secondary | ICD-10-CM | POA: Diagnosis not present

## 2023-05-19 DIAGNOSIS — I480 Paroxysmal atrial fibrillation: Secondary | ICD-10-CM | POA: Diagnosis not present

## 2023-05-19 DIAGNOSIS — J90 Pleural effusion, not elsewhere classified: Secondary | ICD-10-CM | POA: Diagnosis not present

## 2023-05-19 DIAGNOSIS — Z09 Encounter for follow-up examination after completed treatment for conditions other than malignant neoplasm: Secondary | ICD-10-CM | POA: Diagnosis not present

## 2023-05-19 DIAGNOSIS — I447 Left bundle-branch block, unspecified: Secondary | ICD-10-CM | POA: Diagnosis not present

## 2023-05-19 DIAGNOSIS — Z9889 Other specified postprocedural states: Secondary | ICD-10-CM | POA: Diagnosis not present

## 2023-05-19 DIAGNOSIS — R9431 Abnormal electrocardiogram [ECG] [EKG]: Secondary | ICD-10-CM | POA: Diagnosis not present

## 2023-05-22 ENCOUNTER — Emergency Department (HOSPITAL_COMMUNITY): Payer: Medicare Other

## 2023-05-22 ENCOUNTER — Encounter (HOSPITAL_COMMUNITY): Payer: Self-pay

## 2023-05-22 ENCOUNTER — Telehealth: Payer: Self-pay | Admitting: Internal Medicine

## 2023-05-22 ENCOUNTER — Emergency Department (HOSPITAL_COMMUNITY)
Admission: EM | Admit: 2023-05-22 | Discharge: 2023-05-22 | Disposition: A | Discharge status: Home / Self Care | Payer: Medicare Other | Attending: Emergency Medicine | Admitting: Emergency Medicine

## 2023-05-22 ENCOUNTER — Other Ambulatory Visit: Payer: Self-pay

## 2023-05-22 DIAGNOSIS — R079 Chest pain, unspecified: Secondary | ICD-10-CM | POA: Insufficient documentation

## 2023-05-22 DIAGNOSIS — J984 Other disorders of lung: Secondary | ICD-10-CM | POA: Diagnosis not present

## 2023-05-22 DIAGNOSIS — Z7982 Long term (current) use of aspirin: Secondary | ICD-10-CM | POA: Insufficient documentation

## 2023-05-22 DIAGNOSIS — Z955 Presence of coronary angioplasty implant and graft: Secondary | ICD-10-CM | POA: Insufficient documentation

## 2023-05-22 DIAGNOSIS — D3501 Benign neoplasm of right adrenal gland: Secondary | ICD-10-CM | POA: Diagnosis not present

## 2023-05-22 DIAGNOSIS — R0602 Shortness of breath: Secondary | ICD-10-CM | POA: Insufficient documentation

## 2023-05-22 DIAGNOSIS — I517 Cardiomegaly: Secondary | ICD-10-CM | POA: Diagnosis not present

## 2023-05-22 DIAGNOSIS — J9811 Atelectasis: Secondary | ICD-10-CM | POA: Diagnosis not present

## 2023-05-22 DIAGNOSIS — G8918 Other acute postprocedural pain: Secondary | ICD-10-CM | POA: Insufficient documentation

## 2023-05-22 DIAGNOSIS — M7989 Other specified soft tissue disorders: Secondary | ICD-10-CM | POA: Insufficient documentation

## 2023-05-22 DIAGNOSIS — Z9889 Other specified postprocedural states: Secondary | ICD-10-CM

## 2023-05-22 DIAGNOSIS — J90 Pleural effusion, not elsewhere classified: Secondary | ICD-10-CM | POA: Diagnosis not present

## 2023-05-22 DIAGNOSIS — R918 Other nonspecific abnormal finding of lung field: Secondary | ICD-10-CM | POA: Diagnosis not present

## 2023-05-22 LAB — URINALYSIS, ROUTINE W REFLEX MICROSCOPIC
Bacteria, UA: NONE SEEN
Bilirubin Urine: NEGATIVE
Glucose, UA: NEGATIVE mg/dL
Hgb urine dipstick: NEGATIVE
Ketones, ur: NEGATIVE mg/dL
Leukocytes,Ua: NEGATIVE
Nitrite: NEGATIVE
Protein, ur: >=300 mg/dL — AB
Specific Gravity, Urine: 1.012 (ref 1.005–1.030)
pH: 7 (ref 5.0–8.0)

## 2023-05-22 LAB — CBC
HCT: 40 % (ref 39.0–52.0)
Hemoglobin: 13.2 g/dL (ref 13.0–17.0)
MCH: 31.7 pg (ref 26.0–34.0)
MCHC: 33 g/dL (ref 30.0–36.0)
MCV: 96.2 fL (ref 80.0–100.0)
Platelets: 269 10*3/uL (ref 150–400)
RBC: 4.16 MIL/uL — ABNORMAL LOW (ref 4.22–5.81)
RDW: 13.3 % (ref 11.5–15.5)
WBC: 9.5 10*3/uL (ref 4.0–10.5)
nRBC: 0 % (ref 0.0–0.2)

## 2023-05-22 LAB — COMPREHENSIVE METABOLIC PANEL
ALT: 36 U/L (ref 0–44)
AST: 34 U/L (ref 15–41)
Albumin: 1.8 g/dL — ABNORMAL LOW (ref 3.5–5.0)
Alkaline Phosphatase: 88 U/L (ref 38–126)
Anion gap: 6 (ref 5–15)
BUN: 11 mg/dL (ref 6–20)
CO2: 31 mmol/L (ref 22–32)
Calcium: 8.1 mg/dL — ABNORMAL LOW (ref 8.9–10.3)
Chloride: 99 mmol/L (ref 98–111)
Creatinine, Ser: 1.1 mg/dL (ref 0.61–1.24)
GFR, Estimated: >60 mL/min (ref >60)
Glucose, Bld: 107 mg/dL — ABNORMAL HIGH (ref 70–99)
Potassium: 3.9 mmol/L (ref 3.5–5.1)
Sodium: 136 mmol/L (ref 135–145)
Total Bilirubin: 0.5 mg/dL (ref <1.2)
Total Protein: 4.9 g/dL — ABNORMAL LOW (ref 6.5–8.1)

## 2023-05-22 LAB — I-STAT CG4 LACTIC ACID, ED
Lactic Acid, Venous: 0.8 mmol/L (ref 0.5–1.9)
Lactic Acid, Venous: 0.8 mmol/L (ref 0.5–1.9)

## 2023-05-22 LAB — TROPONIN I (HIGH SENSITIVITY): Troponin I (High Sensitivity): 206 ng/L (ref <18)

## 2023-05-22 LAB — BRAIN NATRIURETIC PEPTIDE: B Natriuretic Peptide: 632.9 pg/mL — ABNORMAL HIGH (ref 0.0–100.0)

## 2023-05-22 MED ORDER — IOHEXOL 350 MG/ML SOLN
75.0000 mL | Freq: Once | INTRAVENOUS | Status: AC | PRN
  Administered 2023-05-22: 75 mL via INTRAVENOUS

## 2023-05-22 NOTE — ED Notes (Signed)
Placed pt on 3L 02 per [place]

## 2023-05-22 NOTE — ED Provider Notes (Signed)
[facility] AT [facility] Provider Note   CSN: [id number] Arrival date & time: 05/22/23  1507     History  Chief Complaint  Patient presents with   Post-op Problem    Joseph [name]. is a 57 y.o. male.  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 Allison Quarry.  Patient reports he has had some worsening shortness of breath since his discharge from the hospital.  It seemed to worsen more acutely since last night.  Patient does wear nighttime CPAP.  Patient's wife reports that last night his oxygen saturations they measured got as low as 70%.  She reports with CPAP, the highest O2  was about 92% at home.  Denies he had any change in chest pain since his procedure.  He reports has been taking oxycodone for pain control.  He does not feel that pain worsened or changed in quality or severity.  There has been no fever.  He has had slight cough since the procedure but no worsening.  Patient does notably have fair amount of swelling of bilateral lower extremities.  He reports that it is no worse since his surgery.  Patient has history of panic attacks.  Since the procedure, panic attacks have been more frequent.  He will start to feel like everything is closing in and he needs to sit up at the side of the bed.  He was started on Lasix 2 days ago.  He has not seen an improvement in swelling or shortness of breath since that time.  He discussed his symptoms with his surgical team and was advised to come to the emergency department to get evaluated for a blood clot or other complication.    Home Medications Prior to Admission medications   Medication Sig Start Date End Date Taking? Authorizing Provider  amiodarone (PACERONE) 200 MG tablet Take 200 mg by mouth daily. Take 1 tablet by mouth twice daily for 7 days, then take 1 tablet for 28 days. 05/17/23 06/21/23 Yes [provider]  Ascorbic Acid (VITAMIN C) 1000 MG tablet  Take 1,000 mg by mouth daily.   Yes [provider]  aspirin 81 MG tablet Take 81 mg by mouth daily.   Yes [provider]  busPIRone (BUSPAR) 15 MG tablet Take 15 mg by mouth 2 (two) times daily.   Yes [provider]  Cholecalciferol (VITAMIN D) 50 MCG (2000 UT) tablet Take 2,000 Units by mouth daily.   Yes [provider]  EPINEPHrine (EPIPEN 2-PAK) 0.3 mg/0.3 mL IJ SOAJ injection Inject 0.3 mLs (0.3 mg total) into the muscle as needed for anaphylaxis. 12/16/19  Yes Ellamae Sia, DO  levothyroxine (SYNTHROID) 200 MCG tablet Take 200 mcg by mouth daily. 12/27/21  Yes [provider]  metoprolol succinate (TOPROL XL) 25 MG 24 hr tablet Take 0.5 tablets (12.5 mg total) by mouth at bedtime. Patient taking differently: Take 12.5 mg by mouth in the morning. 03/20/23  Yes Chandrasekhar, Mahesh A, MD  Multiple Vitamins-Minerals (MULTIVITAMIN WITH MINERALS) tablet Take 1 tablet by mouth daily.   Yes [provider]  nitroGLYCERIN (NITROSTAT) 0.4 MG SL tablet Place 0.4 mg under the tongue every 5 (five) minutes as needed for chest pain.   Yes [provider]  Oxycodone HCl 20 MG TABS Take 1 tablet (20 mg total) by mouth as directed. Patient taking differently: Take 20 mg by mouth 5 (five) times daily  as needed (pain). 01/05/22  Yes Campbell Lerner, MD  pravastatin (PRAVACHOL) 80 MG tablet Take 80 mg by mouth daily.   Yes [provider]  pyridoxine (B-6) 100 MG tablet Take 100 mg by mouth daily.   Yes [provider]  tamsulosin (FLOMAX) 0.4 MG CAPS capsule Take 0.4 mg by mouth at bedtime. 05/17/23 06/16/23 Yes [provider]  Bismuth Subsalicylate (PEPTO-BISMOL) 262 MG TABS Take 524 mg by mouth daily as needed (indigestion).    [provider]  diphenhydramine-acetaminophen (TYLENOL PM) 25-500 MG TABS tablet Take 2 tablets by mouth at bedtime as needed (sleep).    [provider]      Allergies     Alpha-gal, Lisinopril, Penicillins, Gadavist [gadobutrol], Morphine, and Tetanus toxoid    Review of Systems   Review of Systems  Physical Exam Updated Vital Signs BP (!) 125/100   Pulse 66   Temp 98.4 F (36.9 C) (Oral)   Resp 18   Ht 6' (1.829 m)   Wt 129.3 kg   SpO2 93%   BMI 38.65 kg/m  Physical Exam Constitutional:      Comments: Alert nontoxic.  No respiratory distress at rest.  Patient is pale in appearance.  Mental status is clear.  HENT:     Mouth/Throat:     Mouth: Mucous membranes are dry.     Pharynx: Oropharynx is clear.  Eyes:     Extraocular Movements: Extraocular movements intact.  Cardiovascular:     Rate and Rhythm: Normal rate and regular rhythm.     Heart sounds: Murmur heard.     Comments: 2 out of 6 to 3 out of 6 systolic ejection murmur. Pulmonary:     Comments: Mild tachypnea.  Crackles bilateral lung fields to about mid lung fields.  Speaking in full sentences.  Central chest wound is healing well.  No erythema or swelling. Abdominal:     Comments: Abdomen is somewhat protuberant and obese.  No significant tenderness.  There is some abdominal wall ecchymosis.  Musculoskeletal:     Comments: 2+ pitting edema bilateral lower extremities.  No wounds to the feet.  Skin:    General: Skin is warm and dry.     Coloration: Skin is pale.  Neurological:     General: No focal deficit present.     Mental Status: He is oriented to person, place, and time.     Motor: No weakness.     Coordination: Coordination normal.     Comments: No focal weakness or incoordination.  Psychiatric:     Comments: Patient is calm and appropriately interactive.  He does however during the course of the interview report feeling very anxious and needing the side rail down to sit up at the edge of the bed.  Sitting up at the edge of the bed he is alert and collected.     ED Results / Procedures / Treatments   Labs (all labs ordered are listed, but only abnormal results are  displayed) Labs Reviewed  CBC - Abnormal; Notable for the following components:      Result Value   RBC 4.16 (*)    All other components within normal limits  COMPREHENSIVE METABOLIC PANEL - Abnormal; Notable for the following components:   Glucose, Bld 107 (*)    Calcium 8.1 (*)    Total Protein 4.9 (*)    Albumin 1.8 (*)    All other components within normal limits  BRAIN NATRIURETIC PEPTIDE - Abnormal; Notable for  the following components:   B Natriuretic Peptide 632.9 (*)    All other components within normal limits  URINALYSIS, ROUTINE W REFLEX MICROSCOPIC - Abnormal; Notable for the following components:   Protein, ur >=300 (*)    All other components within normal limits  TROPONIN I (HIGH SENSITIVITY) - Abnormal; Notable for the following components:   Troponin I (High Sensitivity) 206 (*)    All other components within normal limits  I-STAT CG4 LACTIC ACID, ED  I-STAT CG4 LACTIC ACID, ED    EKG EKG Interpretation Date/Time:  Monday May 22 2023 16:08:15 EST Ventricular Rate:  69 PR Interval:  194 QRS Duration:  180 QT Interval:  489 QTC Calculation: 524 R Axis:   80  Text Interpretation: Sinus rhythm Left bundle branch block Baseline wander in lead(s) V5 V6 agree Confirmed by Arby Barrette [phone]) on 05/22/2023 5:15:26 PM  Radiology No results found.  Procedures Procedures   CRITICAL CARE Performed by: Arby Barrette   Total critical care time: 30 minutes  Critical care time was exclusive of separately billable procedures and treating other patients.  Critical care was necessary to treat or prevent imminent or life-threatening deterioration.  Critical care was time spent personally by me on the following activities: development of treatment plan with patient and/or surrogate as well as nursing, discussions with consultants, evaluation of patient's response to treatment, examination of patient, obtaining history from patient or surrogate, ordering and  performing treatments and interventions, ordering and review of laboratory studies, ordering and review of radiographic studies, pulse oximetry and re-evaluation of patient's condition.  Medications Ordered in ED Medications  iohexol (OMNIPAQUE) 350 MG/ML injection 75 mL (75 mLs Intravenous Contrast Given 05/22/23 1815)    ED Course/ Medical Decision Making/ A&P                                 Medical Decision Making Amount and/or Complexity of Data Reviewed Labs: ordered. Radiology: ordered.  Risk Prescription drug management.   Patient is 11 days postop from a myomectomy.  He has been experiencing shortness of breath.  There appears to be worsening over about the past 24 to 48 hours.  Reportedly at home the documented oxygen saturation 70%.  Here in the emergency department oxygen saturations are about 90%.  Patient has multiple potential complications.  Patient has risk for PE\pericardial effusion\pneumonia\congestive heart failure.  Will proceed with diagnostic evaluation.  At this time, patient is on supplemental oxygen but not needing additional respiratory support.  Will continue to monitor closely.  Troponin elevated 100.  This is consistent with postoperative cardiac findings.  EKG reviewed without acute ischemic changes.  Patient had myomectomy but not coronary artery disease.  At this time lower suspicion for ischemic event.  Metabolic panel GFR stable potassium normal LFTs within normal limits.  BNP 632.  Lactic acid 0.8.  PE study visually reviewed by myself and radiology interpretation reviewed.  No PE present.  Small bilateral pleural effusions but no significant vascular congestion or consolidations.  Patient is reassessed and remained stable.  I have removed any oxygen and patient is maintaining oxygen saturation in the mid 90s.  No hypoxia on room air.  At this time with completed diagnostic workup PE is ruled out, I do not suspect postoperative infection.  Wound is  well-healed and no findings to suggest pneumonia.  Patient is afebrile with normal lactic acid.  We reviewed potential for observation for  monitoring of oxygen saturations and heart rate with postoperative dyspnea.  At this time after extensive discussion with family members at bedside, patient wishes to go home and follow-up closely with his cardiac team.  I agree this is reasonable.  At this time patient has remained stable throughout his stay in the emergency department.  Discharged in stable condition.        Final Clinical Impression(s) / ED Diagnoses Final diagnoses:  Shortness of breath  Post-operative state    Rx / DC Orders ED Discharge Orders     None         Arby Barrette, MD 05/29/23 [phone]

## 2023-05-22 NOTE — ED Triage Notes (Signed)
Pt had a myectomy and CABG in [facility] on 11/21 and is having panic attacks, SOB and hypoxia. Pt Sa02 79% on RA at 0330 today.

## 2023-05-22 NOTE — ED Notes (Signed)
Pt had an episode of confusion for a few seconds. Pt did not remember having recent CABG as he was talking to this RN. Dr.Pfeiffer was notified immediately as wife states pt was recently diagnosed with TIA on Nov 25. Pt is now alert and oriented x 4. MD came at bedside and is aware. Will continue to monitor.

## 2023-05-22 NOTE — Telephone Encounter (Signed)
Call to patient and spouse to discuss concerns. Spouse [name] (DPR) reports that patient was at [facility] for myomectomy, discharging 05/17/23. He was seen for f/u appt at [facility] on 05/19/23 and was weaned off O2 at that time. Patient and spouse drove halfway home on 05/19/23 and arrived home 05/20/23. On 05/19/23 patient had an episode of low 02 saturations and panic attacks that lasted an hour. Last night 05/21/23 patient had another episode of low 02 79%. Spouse states today his O2 sats were 88-89% and when he used his cpap to help him breathe they came up to 90%. Spouse states he is about to walk the length of the house and is not SOB on exertion but states patient had another "panic attack" today where he couldn't breathe well. Spouse states patient has been prescribed lasix for swollen feet since he has been discharged from [facility] as well.  Spouse states she called [facility] and asked them to prescribe O2 for patient, and was was advised to take patient to ED. Spouse is asking if she can send [facility] records to Dr. Izora Ribas for review, or if he can order O2 so that patient does not have to go to ED.   Advised spouse and patient to present to ED as that would be the fastest way to address his breathing difficulty. Spouse verbalizes understanding and agrees to plan.

## 2023-05-22 NOTE — Discharge Instructions (Signed)
1.  Continue to work with your cardiologist and primary doctor.  Call tomorrow for updates. 2.  Return to the emergency department immediately if you have new or worsening shortness of breath or chest pain or fever.

## 2023-05-22 NOTE — Telephone Encounter (Signed)
Per Wife  [facility] just called her and was trying to make a referral for pt because he has amyloidosis. She explained to them they were 8 hours away. They told her to talk to Dr Izora Ribas and see if he will refer him. Pt is currently in the ER for his o2 dropping. Wife  would like a call back

## 2023-05-22 NOTE — ED Notes (Signed)
Pt given sandwich bag and cola at this time.

## 2023-05-22 NOTE — Telephone Encounter (Signed)
Pt 's wife is concerned about o2 levels dropping to 61 which is causing pt to have panic attacks. Recently had open heart surgery at [facility]

## 2023-05-29 NOTE — Telephone Encounter (Signed)
Patient's spouse is calling to follow up on this referral due to wanting to get in as soon as possible. She is unsure of where the best place is to be seen for this, but needs something closer than the 8 hour distance of the [facility]. She states if she does not hear back in the next 3-4 hours she will callback due to there being numerous messages that have been sent regarding this. Advised Dr. Izora Ribas is in clinic today, but message is being sent as high priority. Please advise.

## 2023-05-29 NOTE — Telephone Encounter (Signed)
Called pt spouse [name] scheduled OV for tomorrow 05/30/23 at 4:20 pm.  MD will discuss treatment for amyloidosis.

## 2023-05-30 ENCOUNTER — Encounter: Payer: Self-pay | Admitting: Internal Medicine

## 2023-05-30 ENCOUNTER — Ambulatory Visit: Payer: Medicare Other | Attending: Internal Medicine | Admitting: Internal Medicine

## 2023-05-30 VITALS — BP 110/70 | HR 62 | Ht 72.0 in | Wt 278.2 lb

## 2023-05-30 DIAGNOSIS — I43 Cardiomyopathy in diseases classified elsewhere: Secondary | ICD-10-CM | POA: Diagnosis not present

## 2023-05-30 DIAGNOSIS — E854 Organ-limited amyloidosis: Secondary | ICD-10-CM | POA: Insufficient documentation

## 2023-05-30 DIAGNOSIS — I421 Obstructive hypertrophic cardiomyopathy: Secondary | ICD-10-CM

## 2023-05-30 DIAGNOSIS — R9431 Abnormal electrocardiogram [ECG] [EKG]: Secondary | ICD-10-CM | POA: Diagnosis not present

## 2023-05-30 DIAGNOSIS — I517 Cardiomegaly: Secondary | ICD-10-CM | POA: Diagnosis not present

## 2023-05-30 MED ORDER — FUROSEMIDE 20 MG PO TABS: 20.0000 mg | ORAL_TABLET | Freq: Every day | ORAL | 3 refills | Status: DC | PRN

## 2023-05-30 NOTE — Progress Notes (Signed)
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 abnormalities.  There is Midwall scar/LGE in the lateral LV wall, mid to apical segments.  LGE/scar comprises 9 g, about 4% of total LV myocardial mass.  LVEDV: 198 ml  LVESV: 76 ml  SV: 122 ml  CO:4 L/min  Myocardial mass: 242g  2. Normal right ventricular size, thickness and systolic function (RVEF = 54%). There are no regional wall motion abnormalities.  3.  Normal left and right atrial size.  4. Normal size of the aortic root, ascending aorta and pulmonary artery.  5.  Mild mitral regurgitation.  6.  Normal pericardium.  trivial pericardial effusion.  IMPRESSION: 1. Normal LV function.  LVEF 62%  2. Severe asymmetric LV basal septal hypertrophy, measuring upto 18 mm in maximal thickness.  3. There is systolic anterior motion (SAM) of mitral  valve causing LV outflow tract obstruction.  4. Midwall scar/LGE in the lateral LV wall, mid to apical segments.  5. LGE/scar comprises 9 g, about 4% of total LV myocardial mass.  6. Normal RV size and function.  7. 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   Electronically Signed By: Debbe Odea M.D. On: 09/14/2022 13:55              Recent Labs: 04/26/2023: TSH 7.940 05/22/2023: ALT 36; B Natriuretic Peptide 632.9; BUN 11; Creatinine, Ser 1.10; Hemoglobin 13.2; Platelets 269; Potassium 3.9; Sodium 136  Recent Lipid Panel    Component Value Date/Time   CHOL 174 06/28/2022 0958   TRIG 373 (A) 03/31/2023 0000   HDL 41 06/28/2022 0958   CHOLHDL 4.2 06/28/2022 0958   CHOLHDL 6.6 05/28/2013 0535   VLDL 50 (H) 05/28/2013 0535   LDLCALC 130 03/31/2023 0000   LDLCALC 98 06/28/2022 0958      Physical Exam:    VS:  BP 110/70   Pulse 62   Ht 6' (1.829 m)   Wt 278 lb 3.2 oz (126.2 kg)   SpO2 95% Comment: oxy was  initialy at 91, raised to 95 with deep breaths  BMI 37.73 kg/m     Wt Readings from Last 3 Encounters:  05/30/23 278 lb 3.2 oz (126.2 kg)  05/22/23 285 lb (129.3 kg)  05/01/23 270 lb (122.5 kg)     GEN: Mild distress, obesity HEENT: Normal NECK: No JVD CARDIAC: regular rhythm, no murmur no rubs, gallops RESPIRATORY:  Bibasilar crackles improves with inspiration ABDOMEN: Soft, non-tender, non-distended MUSCULOSKELETAL:  +1 bilateral edema; No deformity  SKIN: Warm and dry NEUROLOGIC:  Alert and oriented x 3 PSYCHIATRIC:  Normal affect   ASSESSMENT:    1. HOCM (hypertrophic obstructive cardiomyopathy) (HCC)   2. Left ventricular hypertrophy   3. Abnormal electrocardiogram (ECG) (EKG)    PLAN:    Postoperative Atrial Fibrillation (CHADVASC NA) - Postoperative atrial fibrillation following open heart surgery, currently managed with amiodarone for 28 days. Asymptomatic but with a history of AFib detected during short stay.  Discussed potential need for anticoagulation if AFib persists postoperatively. Explained increased risk in hypertrophic cardiomyopathy and cardiac amyloidosis, and low to medium chance of recurrence post-amiodarone. - Continue amiodarone for 28 days - Monitor for AFib recurrence post-amiodarone (March 1st [employer]ice Monitor) - Consider anticoagulation if AFib persists  Nonobstructive Hypertrophic Cardiomyopathy - Nonobstructive hypertrophic cardiomyopathy post-myectomy with symptom improvement. Discussed potential for fluid retention and need for diuretics. Plan to monitor weight and fluid status, adjusting diuretic dose as needed. 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 Immunohistochemical staining did not confirm ATTR or AL types. Discussed potential treatments for ATTR and AL types, including tafamidis, vutrisiran, and eplontersen. Genetic testing for hereditary ATTR is planned. Discussed implications of different amyloid types and potential hematology referral if AL type is confirmed. - Await mass spectrometry results - Order genetic test for hereditary ATTR - Consider pyrophosphate scan (before 06/19/23) and light chain testing (12/24) if amyloidosis confirmed - Refer to hematologist if AL type confirmed - genetics offered for counseling - maximum hypertrophy 22 mm with 14.5 g removed  Coronary Artery Disease - Coronary artery disease status post-proximal RCA bypass during recent surgery. No new chest pain reported. Explained that the bypass was preventive during the myectomy. - Continue current medications   Hypoxic Respiratory Failure - Lingering  hypoxic respiratory failure post-surgery, likely due to atelectasis. Chest x-ray showed bilateral atelectasis. Discussed use of incentive spirometer to improve lung function and potential need for home oxygen testing if hypoxia persists. - Use incentive spirometer every hour - Monitor oxygen levels - Consider home oxygen testing if hypoxia persists- I have Cc'ed our office manager to this note; I would like him to get testing to see if he needs O2 but am unclear if he can get this through [facility] - daughter is negative  Panic Attacks Frequent panic attacks, particularly in the evenings, potentially exacerbated by hypoxia. Discussed non-pharmacological interventions such as cognitive behavioral therapy and use of a fan for relief. Explained potential link to low oxygen levels and stress of recent surgery. - Use a fan during panic attacks (discussed physiologic bases of this) - Consider cognitive behavioral therapy - Monitor for improvement with better oxygenation  Hypertension - continue current medications  Follow-up - Follow up in 12/24 - Start cardiac rehab at week 7 - Review mass spectrometry results and adjust treatment accordingly (we have asked for results from [facility] clinic - Monitor for AFib recurrence and adjust treatment if necessary.  Time Spent Directly with Patient:   I have spent a total of 77 minutes with the patient reviewing notes, imaging, EKGs, labs, [facility] notes, operative report and examining the patient as well as establishing an assessment and plan that was discussed personally with the patient. Discussed disease state education, reviewed CBT techniques.       Cardiac Rehabilitation Eligibility Assessment      Medication Adjustments/Labs and Tests Ordered: Current medicines are reviewed at length with the patient today.  Concerns regarding medicines are outlined above.  Orders Placed This Encounter  Procedures   SPEP   UPEP   PYP Scan   EKG  12-Lead   Meds ordered this encounter  Medications   furosemide (LASIX) 20 MG tablet    Sig: Take 1 tablet (20 mg total) by mouth daily as needed.    Dispense:  45 tablet    Refill:  3    Patient Instructions  Medication Instructions:  Your physician has recommended you make the following change in your medication:  START: furosemide (Lasix) 20 mg by mouth once daily as needed for swelling/ shortness of breath  *If you need a refill on your cardiac medications before your next appointment, please call your pharmacy*   Lab Work:  If you have labs (blood work) drawn today and your tests are completely normal, you will receive your results only by: MyChart Message (if you have MyChart) OR A paper copy in the mail If you have any lab test that is abnormal or we need to change your treatment, we will call you to review the results.   Testing/Procedures: Your physician has requested that you have a Cardiac Amyloid Scan.   Your physician has requested that you complete Home Genetic Testing for Cardiac Amyloid.  Testing kit will come to you in the mail.   Follow-Up:As scheduled At [facility], you and your health needs are our priority.  As part of our continuing mission to provide you with exceptional heart care, we have created designated Provider Care Teams.  These Care Teams include your primary Cardiologist (physician) and Advanced Practice Providers (APPs -  Physician Assistants and Nurse Practitioners) who all work together to provide you with the care you need, when you need it.   Provider:   Riley Lam, MD      Signed, Christell Constant, MD  05/30/2023 6:00 PM    [facility]

## 2023-05-30 NOTE — Patient Instructions (Signed)
Medication Instructions:  Your physician has recommended you make the following change in your medication:  START: furosemide (Lasix) 20 mg by mouth once daily as needed for swelling/ shortness of breath  *If you need a refill on your cardiac medications before your next appointment, please call your pharmacy*   Lab Work:  If you have labs (blood work) drawn today and your tests are completely normal, you will receive your results only by: MyChart Message (if you have MyChart) OR A paper copy in the mail If you have any lab test that is abnormal or we need to change your treatment, we will call you to review the results.   Testing/Procedures: Your physician has requested that you have a Cardiac Amyloid Scan.   Your physician has requested that you complete Home Genetic Testing for Cardiac Amyloid.  Testing kit will come to you in the mail.   Follow-Up:As scheduled At [facility], you and your health needs are our priority.  As part of our continuing mission to provide you with exceptional heart care, we have created designated Provider Care Teams.  These Care Teams include your primary Cardiologist (physician) and Advanced Practice Providers (APPs -  Physician Assistants and Nurse Practitioners) who all work together to provide you with the care you need, when you need it.   Provider:   Riley Lam, MD

## 2023-06-01 DIAGNOSIS — R9431 Abnormal electrocardiogram [ECG] [EKG]: Secondary | ICD-10-CM | POA: Diagnosis not present

## 2023-06-01 DIAGNOSIS — I517 Cardiomegaly: Secondary | ICD-10-CM | POA: Diagnosis not present

## 2023-06-02 LAB — UPEP/UIFE/LIGHT CHAINS/TP, 24-HR UR: Kappa/Lambda Ratio,U: 0.43 *Deleted — ABNORMAL LOW (ref 1.83–14.26)

## 2023-06-06 DIAGNOSIS — R7989 Other specified abnormal findings of blood chemistry: Secondary | ICD-10-CM | POA: Diagnosis not present

## 2023-06-06 DIAGNOSIS — N183 Chronic kidney disease, stage 3 unspecified: Secondary | ICD-10-CM | POA: Diagnosis not present

## 2023-06-07 DIAGNOSIS — I421 Obstructive hypertrophic cardiomyopathy: Secondary | ICD-10-CM | POA: Diagnosis not present

## 2023-06-07 DIAGNOSIS — I43 Cardiomyopathy in diseases classified elsewhere: Secondary | ICD-10-CM | POA: Diagnosis not present

## 2023-06-07 DIAGNOSIS — E854 Organ-limited amyloidosis: Secondary | ICD-10-CM | POA: Diagnosis not present

## 2023-06-08 ENCOUNTER — Telehealth (HOSPITAL_COMMUNITY): Payer: Self-pay | Admitting: *Deleted

## 2023-06-08 NOTE — Telephone Encounter (Signed)
Spoke with patient's wife to remind her about her husband's Amyloid Study on 06/09/23 at 12:30.

## 2023-06-09 ENCOUNTER — Ambulatory Visit (HOSPITAL_COMMUNITY): Payer: Medicare Other | Attending: Internal Medicine

## 2023-06-09 ENCOUNTER — Other Ambulatory Visit: Payer: Self-pay

## 2023-06-09 DIAGNOSIS — I517 Cardiomegaly: Secondary | ICD-10-CM | POA: Insufficient documentation

## 2023-06-09 DIAGNOSIS — I3139 Other pericardial effusion (noninflammatory): Secondary | ICD-10-CM

## 2023-06-09 DIAGNOSIS — R9431 Abnormal electrocardiogram [ECG] [EKG]: Secondary | ICD-10-CM | POA: Insufficient documentation

## 2023-06-09 DIAGNOSIS — E039 Hypothyroidism, unspecified: Secondary | ICD-10-CM | POA: Diagnosis not present

## 2023-06-09 LAB — MYOCARDIAL AMYLOID PLANAR & SPECT: H/CL Ratio: 1.37

## 2023-06-09 MED ORDER — TECHNETIUM TC 99M PYROPHOSPHATE
21.3000 | Freq: Once | INTRAVENOUS | Status: AC
  Administered 2023-06-09: 21.3 via INTRAVENOUS

## 2023-06-09 NOTE — Progress Notes (Signed)
Pt advised of need for echo per MD.  Pt in office for testing.  Echo scheduled for 06/12/23 at 1 pm pending testing is precert with insurance in time.   MD note" In addition we received a discharge summary from [facility]- they recommend a repeat echocardiogram for follow up of a pericardial effusion post surgery.  We we order limited echo for him for this? "

## 2023-06-10 LAB — TSH: TSH: 10.2 u[IU]/mL — ABNORMAL HIGH (ref 0.450–4.500)

## 2023-06-10 LAB — T4, FREE: Free T4: 1.44 ng/dL (ref 0.82–1.77)

## 2023-06-12 ENCOUNTER — Ambulatory Visit: Payer: Medicare Other | Admitting: Nurse Practitioner

## 2023-06-12 ENCOUNTER — Other Ambulatory Visit: Payer: Self-pay | Admitting: Internal Medicine

## 2023-06-12 ENCOUNTER — Encounter: Payer: Self-pay | Admitting: Nurse Practitioner

## 2023-06-12 ENCOUNTER — Ambulatory Visit (HOSPITAL_BASED_OUTPATIENT_CLINIC_OR_DEPARTMENT_OTHER): Payer: Medicare Other

## 2023-06-12 VITALS — BP 102/70 | HR 64 | Ht 72.0 in | Wt 280.2 lb

## 2023-06-12 DIAGNOSIS — I421 Obstructive hypertrophic cardiomyopathy: Secondary | ICD-10-CM | POA: Diagnosis not present

## 2023-06-12 DIAGNOSIS — J9611 Chronic respiratory failure with hypoxia: Secondary | ICD-10-CM | POA: Diagnosis not present

## 2023-06-12 DIAGNOSIS — I3139 Other pericardial effusion (noninflammatory): Secondary | ICD-10-CM | POA: Insufficient documentation

## 2023-06-12 DIAGNOSIS — G8929 Other chronic pain: Secondary | ICD-10-CM | POA: Diagnosis not present

## 2023-06-12 DIAGNOSIS — I5023 Acute on chronic systolic (congestive) heart failure: Secondary | ICD-10-CM | POA: Diagnosis not present

## 2023-06-12 DIAGNOSIS — I43 Cardiomyopathy in diseases classified elsewhere: Secondary | ICD-10-CM | POA: Diagnosis not present

## 2023-06-12 DIAGNOSIS — I110 Hypertensive heart disease with heart failure: Secondary | ICD-10-CM | POA: Diagnosis not present

## 2023-06-12 DIAGNOSIS — G4736 Sleep related hypoventilation in conditions classified elsewhere: Secondary | ICD-10-CM | POA: Diagnosis not present

## 2023-06-12 DIAGNOSIS — I452 Bifascicular block: Secondary | ICD-10-CM | POA: Diagnosis not present

## 2023-06-12 DIAGNOSIS — R7981 Abnormal blood-gas level: Secondary | ICD-10-CM | POA: Diagnosis not present

## 2023-06-12 DIAGNOSIS — I6381 Other cerebral infarction due to occlusion or stenosis of small artery: Secondary | ICD-10-CM | POA: Diagnosis not present

## 2023-06-12 DIAGNOSIS — I6340 Cerebral infarction due to embolism of unspecified cerebral artery: Secondary | ICD-10-CM | POA: Diagnosis not present

## 2023-06-12 DIAGNOSIS — E039 Hypothyroidism, unspecified: Secondary | ICD-10-CM

## 2023-06-12 DIAGNOSIS — E785 Hyperlipidemia, unspecified: Secondary | ICD-10-CM | POA: Diagnosis not present

## 2023-06-12 DIAGNOSIS — I639 Cerebral infarction, unspecified: Secondary | ICD-10-CM | POA: Diagnosis not present

## 2023-06-12 DIAGNOSIS — I38 Endocarditis, valve unspecified: Secondary | ICD-10-CM | POA: Diagnosis not present

## 2023-06-12 DIAGNOSIS — I480 Paroxysmal atrial fibrillation: Secondary | ICD-10-CM | POA: Diagnosis not present

## 2023-06-12 DIAGNOSIS — G4733 Obstructive sleep apnea (adult) (pediatric): Secondary | ICD-10-CM | POA: Diagnosis not present

## 2023-06-12 DIAGNOSIS — R591 Generalized enlarged lymph nodes: Secondary | ICD-10-CM | POA: Diagnosis not present

## 2023-06-12 DIAGNOSIS — I69311 Memory deficit following cerebral infarction: Secondary | ICD-10-CM | POA: Diagnosis not present

## 2023-06-12 DIAGNOSIS — E854 Organ-limited amyloidosis: Secondary | ICD-10-CM | POA: Diagnosis not present

## 2023-06-12 LAB — ECHOCARDIOGRAM LIMITED
Area-P 1/2: 2.95 cm2
S' Lateral: 3.8 cm

## 2023-06-12 MED ORDER — LEVOTHYROXINE SODIUM 200 MCG PO TABS: 200.0000 ug | ORAL_TABLET | Freq: Every day | ORAL | 1 refills | Status: AC

## 2023-06-12 NOTE — Patient Instructions (Signed)
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
## 2023-06-13 ENCOUNTER — Ambulatory Visit: Payer: Medicare Other | Attending: Internal Medicine | Admitting: Internal Medicine

## 2023-06-13 VITALS — BP 122/78 | HR 69 | Ht 72.0 in | Wt 275.0 lb

## 2023-06-13 DIAGNOSIS — I421 Obstructive hypertrophic cardiomyopathy: Secondary | ICD-10-CM

## 2023-06-13 DIAGNOSIS — E854 Organ-limited amyloidosis: Secondary | ICD-10-CM | POA: Diagnosis not present

## 2023-06-13 DIAGNOSIS — R7981 Abnormal blood-gas level: Secondary | ICD-10-CM | POA: Diagnosis not present

## 2023-06-13 DIAGNOSIS — I3139 Other pericardial effusion (noninflammatory): Secondary | ICD-10-CM | POA: Insufficient documentation

## 2023-06-13 DIAGNOSIS — I43 Cardiomyopathy in diseases classified elsewhere: Secondary | ICD-10-CM

## 2023-06-13 NOTE — Progress Notes (Signed)
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 lb (126.1 kg)     GEN: No distress, obesity HEENT: Normal NECK: No JVD CARDIAC: regular rhythm, no murmur no rubs, gallops RESPIRATORY:  CTAB ABDOMEN: Soft, non-tender, non-distended MUSCULOSKELETAL:  +1 bilateral edema; No deformity  SKIN: Warm and dry NEUROLOGIC:  Alert and oriented x 3 PSYCHIATRIC:  Normal affect   ASSESSMENT:    1. Low oxygen saturation   2. Pericardial effusion   3. HOCM (hypertrophic obstructive cardiomyopathy) (HCC)   4. Cardiac amyloidosis (HCC)     PLAN:    Nonobstructive Hypertrophic Cardiomyopathy - Nonobstructive hypertrophic cardiomyopathy post-myectomy with symptom improvement. Discussed potential for  fluid retention and need for diuretics. Plan to monitor weight and fluid status, adjusting diuretic dose as needed. - Continue furosemide 40 mg daily with 20 mg PO PRN lasix - Monitor weight and fluid status - BNP and BMP on 7-10 days, likely will add SGLT2i - known 1st HB and LBBB; unchanged no PPM indication at this time - set up through [facility] and the [facility] - low risk SCD - maximum hypertrophy 22 mm with 14.5 g removed - daughter negative screen 2024; next in 2029 - he will be able to start lifting more things slowly 07/23/23 - he will be able to start cardiac rehab 07/23/23- cleared for this [facility])  Cardiac Amyloidosis Cardiac amyloidosis with pending typing by mass spectrometry. Immunohistochemical staining did not confirm ATTR or AL types. Discussed potential treatments for ATTR and AL types, including tafamidis, vutrisiran, and eplontersen. Genetic testing for hereditary  ATTR is planned. Discussed implications of different amyloid types and potential hematology referral if AL type is confirmed. -Likely more consistent with  AA, ApoAI-IV or E.   - we will reach out to [facility] and/or Clar[facility] Gower about their PET imaging trials and their therapy trial as well - proteinuria could be related to either his recent kidney stone or amyloid - will not plan to pursue EMB given tissue diagnosis at [facility]  Coronary Artery Disease - Coronary artery disease status post-proximal RCA bypass during recent surgery. No new chest pain reported. - Continue current medications  Postoperative Atrial Fibrillation (CHADVASC NA) - Postoperative atrial fibrillation following open heart surgery, currently managed with amiodarone for 28 days. Asymptomatic but with a history of AFib detected during short stay.  - Continue amiodarone for 28 days (almost completed) - Monitor for AFib recurrence post-amiodarone (March 1st [employer] Monitor) - Consider anticoagulation if AFib persists  Panic Attacks -  improved  Hypertension - continue current medications  Follow-up - Follow up in February - Start cardiac rehab at week 7 as above - Monitor for AFib recurrence and adjust treatment if necessary.  Time Spent Directly with Patient:   I have spent a total of 73 minutes with the patient reviewing notes, imaging, EKGs, labs, reviewing genetic testing results with patients and examining the patient as well as establishing an assessment and plan that was discussed personally with the patient. Discussed disease state education, clinical trials for non ATTR/AL CA,  Reviewed care and plan in collaboration with [facility] and [facility].         Medication Adjustments/Labs and Tests Ordered: Current medicines are reviewed at length with the patient today.  Concerns regarding medicines are outlined above.  Orders Placed This Encounter  Procedures   For home use only DME oxygen   Basic metabolic panel   Pro b natriuretic peptide (BNP)   No orders of the defined types were placed in this encounter.   Patient Instructions  Medication Instructions:  Your physician recommends that you continue on your current medications as directed. Please refer to the Current Medication list given to you today.  *If you need a refill on your cardiac medications before your next appointment, please call your pharmacy*   Lab Work: IN 7-10 DAYS: BMP, BNP  If you have labs (blood work) drawn today and your tests are completely normal, you will receive your results only by: MyChart Message (if you have MyChart) OR A paper copy in the mail If you have any lab test that is abnormal or we need to change your treatment, we will call you to review the results.   Testing/Procedures: NONE   Follow-Up: At [facility], you and your health needs are our priority.  As part of our continuing mission to provide you with exceptional heart care, we have created designated Provider Care Teams.  These Care  Teams include your primary Cardiologist (physician) and Advanced Practice Providers (APPs -  Physician Assistants and Nurse Practitioners) who all work together to provide you with the care you need, when you need it.   Your next appointment:   2 month(s)  Provider:   Riley Lam, MD           Signed, Christell Constant, MD  06/13/2023 10:23 AM    [facility]

## 2023-06-13 NOTE — Patient Instructions (Signed)
Medication Instructions:  Your physician recommends that you continue on your current medications as directed. Please refer to the Current Medication list given to you today.  *If you need a refill on your cardiac medications before your next appointment, please call your pharmacy*   Lab Work: IN 7-10 DAYS: BMP, BNP  If you have labs (blood work) drawn today and your tests are completely normal, you will receive your results only by: MyChart Message (if you have MyChart) OR A paper copy in the mail If you have any lab test that is abnormal or we need to change your treatment, we will call you to review the results.   Testing/Procedures: NONE   Follow-Up: At [facility], you and your health needs are our priority.  As part of our continuing mission to provide you with exceptional heart care, we have created designated Provider Care Teams.  These Care Teams include your primary Cardiologist (physician) and Advanced Practice Providers (APPs -  Physician Assistants and Nurse Practitioners) who all work together to provide you with the care you need, when you need it.   Your next appointment:   2 month(s)  Provider:   Riley Lam, MD

## 2023-06-14 LAB — MULTIPLE MYELOMA PANEL, SERUM
Albumin SerPl Elph-Mcnc: 2 g/dL — ABNORMAL LOW (ref 2.9–4.4)
Albumin/Glob SerPl: 0.8 (ref 0.7–1.7)
Alpha 1: 0.2 g/dL (ref 0.0–0.4)
Alpha2 Glob SerPl Elph-Mcnc: 1.2 g/dL — ABNORMAL HIGH (ref 0.4–1.0)
B-Globulin SerPl Elph-Mcnc: 0.9 g/dL (ref 0.7–1.3)
Gamma Glob SerPl Elph-Mcnc: 0.5 g/dL (ref 0.4–1.8)
Globulin, Total: 2.7 g/dL (ref 2.2–3.9)
IgA/Immunoglobulin A, Serum: 173 mg/dL (ref 90–386)
IgG (Immunoglobin G), Serum: 593 mg/dL — ABNORMAL LOW (ref 603–1613)
IgM (Immunoglobulin M), Srm: 100 mg/dL (ref 20–172)
Total Protein: 4.7 g/dL — ABNORMAL LOW (ref 6.0–8.5)

## 2023-06-14 LAB — UPEP/UIFE/LIGHT CHAINS/TP, 24-HR UR
% BETA, Urine: 9.1 %
ALBUMIN, U: 82.8 %
ALPHA 1 URINE: 3.7 %
ALPHA-2-GLOBULIN, U: 2.3 %
Free Kappa Lt Chains,Ur: 23.92 mg/L (ref 1.17–86.46)
Free Lambda Lt Chains,Ur: 55.63 mg/L — ABNORMAL HIGH (ref 0.27–15.21)
GAMMA GLOBULIN URINE: 2.1 %
Kappa/Lambda Ratio,U: 0.43 — ABNORMAL LOW (ref 1.83–14.26)
Protein, 24H Urine: 7432 mg/(24.h) — ABNORMAL HIGH (ref 30–150)
Protein, Ur: 371.6 mg/dL

## 2023-06-15 ENCOUNTER — Other Ambulatory Visit: Payer: Self-pay

## 2023-06-15 ENCOUNTER — Inpatient Hospital Stay (HOSPITAL_COMMUNITY): Payer: Medicare Other

## 2023-06-15 ENCOUNTER — Emergency Department (HOSPITAL_COMMUNITY): Payer: Medicare Other

## 2023-06-15 ENCOUNTER — Encounter (HOSPITAL_COMMUNITY): Payer: Self-pay | Admitting: Internal Medicine

## 2023-06-15 ENCOUNTER — Inpatient Hospital Stay (HOSPITAL_COMMUNITY)
Admission: EM | Admit: 2023-06-15 | Discharge: 2023-06-19 | DRG: 064 | Disposition: A | Discharge status: Home / Self Care | Payer: Medicare Other | Attending: Internal Medicine | Admitting: Internal Medicine

## 2023-06-15 ENCOUNTER — Telehealth: Payer: Self-pay | Admitting: Internal Medicine

## 2023-06-15 DIAGNOSIS — R011 Cardiac murmur, unspecified: Secondary | ICD-10-CM | POA: Diagnosis present

## 2023-06-15 DIAGNOSIS — I421 Obstructive hypertrophic cardiomyopathy: Secondary | ICD-10-CM | POA: Diagnosis present

## 2023-06-15 DIAGNOSIS — G8929 Other chronic pain: Secondary | ICD-10-CM | POA: Diagnosis present

## 2023-06-15 DIAGNOSIS — Z8349 Family history of other endocrine, nutritional and metabolic diseases: Secondary | ICD-10-CM

## 2023-06-15 DIAGNOSIS — I4891 Unspecified atrial fibrillation: Secondary | ICD-10-CM | POA: Diagnosis not present

## 2023-06-15 DIAGNOSIS — F410 Panic disorder [episodic paroxysmal anxiety] without agoraphobia: Secondary | ICD-10-CM | POA: Diagnosis present

## 2023-06-15 DIAGNOSIS — E785 Hyperlipidemia, unspecified: Secondary | ICD-10-CM | POA: Diagnosis present

## 2023-06-15 DIAGNOSIS — Z880 Allergy status to penicillin: Secondary | ICD-10-CM

## 2023-06-15 DIAGNOSIS — Z9861 Coronary angioplasty status: Secondary | ICD-10-CM

## 2023-06-15 DIAGNOSIS — Z8051 Family history of malignant neoplasm of kidney: Secondary | ICD-10-CM

## 2023-06-15 DIAGNOSIS — Z7902 Long term (current) use of antithrombotics/antiplatelets: Secondary | ICD-10-CM

## 2023-06-15 DIAGNOSIS — I639 Cerebral infarction, unspecified: Secondary | ICD-10-CM

## 2023-06-15 DIAGNOSIS — F40240 Claustrophobia: Secondary | ICD-10-CM | POA: Diagnosis present

## 2023-06-15 DIAGNOSIS — J9611 Chronic respiratory failure with hypoxia: Secondary | ICD-10-CM | POA: Diagnosis present

## 2023-06-15 DIAGNOSIS — I452 Bifascicular block: Secondary | ICD-10-CM | POA: Diagnosis present

## 2023-06-15 DIAGNOSIS — I422 Other hypertrophic cardiomyopathy: Secondary | ICD-10-CM

## 2023-06-15 DIAGNOSIS — I480 Paroxysmal atrial fibrillation: Secondary | ICD-10-CM | POA: Insufficient documentation

## 2023-06-15 DIAGNOSIS — Z885 Allergy status to narcotic agent status: Secondary | ICD-10-CM

## 2023-06-15 DIAGNOSIS — E876 Hypokalemia: Secondary | ICD-10-CM

## 2023-06-15 DIAGNOSIS — Z841 Family history of disorders of kidney and ureter: Secondary | ICD-10-CM

## 2023-06-15 DIAGNOSIS — G4733 Obstructive sleep apnea (adult) (pediatric): Secondary | ICD-10-CM | POA: Diagnosis present

## 2023-06-15 DIAGNOSIS — R591 Generalized enlarged lymph nodes: Secondary | ICD-10-CM | POA: Diagnosis present

## 2023-06-15 DIAGNOSIS — Z8679 Personal history of other diseases of the circulatory system: Secondary | ICD-10-CM

## 2023-06-15 DIAGNOSIS — I38 Endocarditis, valve unspecified: Secondary | ICD-10-CM | POA: Diagnosis present

## 2023-06-15 DIAGNOSIS — I69311 Memory deficit following cerebral infarction: Secondary | ICD-10-CM

## 2023-06-15 DIAGNOSIS — Z951 Presence of aortocoronary bypass graft: Secondary | ICD-10-CM

## 2023-06-15 DIAGNOSIS — I3139 Other pericardial effusion (noninflammatory): Secondary | ICD-10-CM | POA: Diagnosis present

## 2023-06-15 DIAGNOSIS — Z8673 Personal history of transient ischemic attack (TIA), and cerebral infarction without residual deficits: Secondary | ICD-10-CM

## 2023-06-15 DIAGNOSIS — I6381 Other cerebral infarction due to occlusion or stenosis of small artery: Principal | ICD-10-CM | POA: Diagnosis present

## 2023-06-15 DIAGNOSIS — Z87892 Personal history of anaphylaxis: Secondary | ICD-10-CM

## 2023-06-15 DIAGNOSIS — E854 Organ-limited amyloidosis: Secondary | ICD-10-CM | POA: Diagnosis present

## 2023-06-15 DIAGNOSIS — E859 Amyloidosis, unspecified: Secondary | ICD-10-CM | POA: Diagnosis not present

## 2023-06-15 DIAGNOSIS — I10 Essential (primary) hypertension: Secondary | ICD-10-CM | POA: Diagnosis not present

## 2023-06-15 DIAGNOSIS — Z8701 Personal history of pneumonia (recurrent): Secondary | ICD-10-CM

## 2023-06-15 DIAGNOSIS — Z8249 Family history of ischemic heart disease and other diseases of the circulatory system: Secondary | ICD-10-CM

## 2023-06-15 DIAGNOSIS — E039 Hypothyroidism, unspecified: Secondary | ICD-10-CM | POA: Insufficient documentation

## 2023-06-15 DIAGNOSIS — I43 Cardiomyopathy in diseases classified elsewhere: Secondary | ICD-10-CM | POA: Diagnosis present

## 2023-06-15 DIAGNOSIS — I110 Hypertensive heart disease with heart failure: Secondary | ICD-10-CM | POA: Diagnosis present

## 2023-06-15 DIAGNOSIS — R7401 Elevation of levels of liver transaminase levels: Secondary | ICD-10-CM | POA: Diagnosis present

## 2023-06-15 DIAGNOSIS — I6340 Cerebral infarction due to embolism of unspecified cerebral artery: Secondary | ICD-10-CM | POA: Diagnosis present

## 2023-06-15 DIAGNOSIS — N400 Enlarged prostate without lower urinary tract symptoms: Secondary | ICD-10-CM | POA: Insufficient documentation

## 2023-06-15 DIAGNOSIS — J9691 Respiratory failure, unspecified with hypoxia: Secondary | ICD-10-CM

## 2023-06-15 DIAGNOSIS — I5023 Acute on chronic systolic (congestive) heart failure: Secondary | ICD-10-CM | POA: Diagnosis not present

## 2023-06-15 DIAGNOSIS — Z79899 Other long term (current) drug therapy: Secondary | ICD-10-CM

## 2023-06-15 DIAGNOSIS — R4189 Other symptoms and signs involving cognitive functions and awareness: Secondary | ICD-10-CM | POA: Diagnosis present

## 2023-06-15 DIAGNOSIS — M545 Low back pain, unspecified: Secondary | ICD-10-CM | POA: Diagnosis present

## 2023-06-15 DIAGNOSIS — G4736 Sleep related hypoventilation in conditions classified elsewhere: Secondary | ICD-10-CM | POA: Diagnosis present

## 2023-06-15 DIAGNOSIS — I2510 Atherosclerotic heart disease of native coronary artery without angina pectoris: Secondary | ICD-10-CM | POA: Diagnosis present

## 2023-06-15 DIAGNOSIS — Z79890 Hormone replacement therapy (postmenopausal): Secondary | ICD-10-CM

## 2023-06-15 DIAGNOSIS — Z8781 Personal history of (healed) traumatic fracture: Secondary | ICD-10-CM

## 2023-06-15 DIAGNOSIS — E669 Obesity, unspecified: Secondary | ICD-10-CM | POA: Diagnosis present

## 2023-06-15 DIAGNOSIS — Z9889 Other specified postprocedural states: Secondary | ICD-10-CM

## 2023-06-15 DIAGNOSIS — Z7982 Long term (current) use of aspirin: Secondary | ICD-10-CM

## 2023-06-15 DIAGNOSIS — R0989 Other specified symptoms and signs involving the circulatory and respiratory systems: Secondary | ICD-10-CM | POA: Diagnosis not present

## 2023-06-15 DIAGNOSIS — Z91014 Allergy to mammalian meats: Secondary | ICD-10-CM

## 2023-06-15 DIAGNOSIS — Z888 Allergy status to other drugs, medicaments and biological substances status: Secondary | ICD-10-CM

## 2023-06-15 DIAGNOSIS — Z6836 Body mass index (BMI) 36.0-36.9, adult: Secondary | ICD-10-CM

## 2023-06-15 DIAGNOSIS — R0602 Shortness of breath: Secondary | ICD-10-CM | POA: Diagnosis not present

## 2023-06-15 DIAGNOSIS — Z8719 Personal history of other diseases of the digestive system: Secondary | ICD-10-CM

## 2023-06-15 DIAGNOSIS — R29700 NIHSS score 0: Secondary | ICD-10-CM | POA: Diagnosis present

## 2023-06-15 DIAGNOSIS — Z87442 Personal history of urinary calculi: Secondary | ICD-10-CM

## 2023-06-15 DIAGNOSIS — I69398 Other sequelae of cerebral infarction: Secondary | ICD-10-CM

## 2023-06-15 DIAGNOSIS — Z833 Family history of diabetes mellitus: Secondary | ICD-10-CM

## 2023-06-15 DIAGNOSIS — Z9089 Acquired absence of other organs: Secondary | ICD-10-CM

## 2023-06-15 DIAGNOSIS — R4689 Other symptoms and signs involving appearance and behavior: Secondary | ICD-10-CM

## 2023-06-15 DIAGNOSIS — Z96642 Presence of left artificial hip joint: Secondary | ICD-10-CM | POA: Diagnosis present

## 2023-06-15 DIAGNOSIS — Z887 Allergy status to serum and vaccine status: Secondary | ICD-10-CM

## 2023-06-15 DIAGNOSIS — Z79891 Long term (current) use of opiate analgesic: Secondary | ICD-10-CM

## 2023-06-15 DIAGNOSIS — N401 Enlarged prostate with lower urinary tract symptoms: Secondary | ICD-10-CM | POA: Diagnosis not present

## 2023-06-15 DIAGNOSIS — Z87891 Personal history of nicotine dependence: Secondary | ICD-10-CM

## 2023-06-15 DIAGNOSIS — R413 Other amnesia: Secondary | ICD-10-CM

## 2023-06-15 HISTORY — DX: Cerebral infarction, unspecified: I63.9

## 2023-06-15 LAB — COMPREHENSIVE METABOLIC PANEL
ALT: 37 U/L (ref 0–44)
AST: 50 U/L — ABNORMAL HIGH (ref 15–41)
Albumin: 2.2 g/dL — ABNORMAL LOW (ref 3.5–5.0)
Alkaline Phosphatase: 107 U/L (ref 38–126)
Anion gap: 9 (ref 5–15)
BUN: 8 mg/dL (ref 6–20)
CO2: 29 mmol/L (ref 22–32)
Calcium: 8.5 mg/dL — ABNORMAL LOW (ref 8.9–10.3)
Chloride: 101 mmol/L (ref 98–111)
Creatinine, Ser: 0.79 mg/dL (ref 0.61–1.24)
GFR, Estimated: >60 mL/min (ref >60)
Glucose, Bld: 109 mg/dL — ABNORMAL HIGH (ref 70–99)
Potassium: 3.9 mmol/L (ref 3.5–5.1)
Sodium: 139 mmol/L (ref 135–145)
Total Bilirubin: 0.5 mg/dL (ref <1.2)
Total Protein: 5.8 g/dL — ABNORMAL LOW (ref 6.5–8.1)

## 2023-06-15 LAB — CBC
HCT: 48.3 % (ref 39.0–52.0)
Hemoglobin: 16.4 g/dL (ref 13.0–17.0)
MCH: 32 pg (ref 26.0–34.0)
MCHC: 34 g/dL (ref 30.0–36.0)
MCV: 94.3 fL (ref 80.0–100.0)
Platelets: 267 10*3/uL (ref 150–400)
RBC: 5.12 MIL/uL (ref 4.22–5.81)
RDW: 13.5 % (ref 11.5–15.5)
WBC: 9 10*3/uL (ref 4.0–10.5)
nRBC: 0 % (ref 0.0–0.2)

## 2023-06-15 LAB — HIV ANTIBODY (ROUTINE TESTING W REFLEX): HIV Screen 4th Generation wRfx: NONREACTIVE

## 2023-06-15 LAB — CBG MONITORING, ED: Glucose-Capillary: 94 mg/dL (ref 70–99)

## 2023-06-15 LAB — BRAIN NATRIURETIC PEPTIDE: B Natriuretic Peptide: 316.7 pg/mL — ABNORMAL HIGH (ref 0.0–100.0)

## 2023-06-15 LAB — LIPASE, BLOOD: Lipase: 24 U/L (ref 11–51)

## 2023-06-15 MED ORDER — SODIUM CHLORIDE 0.9% FLUSH: 3.0000 mL | INTRAVENOUS | Status: DC | PRN

## 2023-06-15 MED ORDER — ASPIRIN 81 MG PO TBEC
81.0000 mg | DELAYED_RELEASE_TABLET | Freq: Every day | ORAL | Status: DC
  Administered 2023-06-15 – 2023-06-19 (×5): 81 mg via ORAL
  Filled 2023-06-15 (×5): qty 1

## 2023-06-15 MED ORDER — LORAZEPAM 1 MG PO TABS: ORAL_TABLET | ORAL | 0 refills | Status: DC

## 2023-06-15 MED ORDER — STROKE: EARLY STAGES OF RECOVERY BOOK
Freq: Once | Status: AC
  Filled 2023-06-15: qty 1

## 2023-06-15 MED ORDER — LORAZEPAM 1 MG PO TABS
1.0000 mg | ORAL_TABLET | Freq: Once | ORAL | Status: AC
Start: 2023-06-15 — End: 2023-06-15
  Administered 2023-06-15: 1 mg via ORAL
  Filled 2023-06-15: qty 1

## 2023-06-15 MED ORDER — SENNOSIDES-DOCUSATE SODIUM 8.6-50 MG PO TABS: 1.0000 | ORAL_TABLET | Freq: Every evening | ORAL | Status: DC | PRN

## 2023-06-15 MED ORDER — ACETAMINOPHEN 325 MG PO TABS
650.0000 mg | ORAL_TABLET | Freq: Four times a day (QID) | ORAL | Status: DC | PRN
  Administered 2023-06-16: 650 mg via ORAL
  Filled 2023-06-15 (×2): qty 2

## 2023-06-15 MED ORDER — OXYCODONE HCL 5 MG PO TABS
20.0000 mg | ORAL_TABLET | Freq: Three times a day (TID) | ORAL | Status: DC | PRN
  Administered 2023-06-16 – 2023-06-19 (×8): 20 mg via ORAL
  Filled 2023-06-15 (×8): qty 4

## 2023-06-15 MED ORDER — BUSPIRONE HCL 10 MG PO TABS
15.0000 mg | ORAL_TABLET | Freq: Two times a day (BID) | ORAL | Status: DC
  Administered 2023-06-15 – 2023-06-19 (×8): 15 mg via ORAL
  Filled 2023-06-15 (×8): qty 2

## 2023-06-15 MED ORDER — PRAVASTATIN SODIUM 40 MG PO TABS
80.0000 mg | ORAL_TABLET | Freq: Every day | ORAL | Status: DC
  Administered 2023-06-15 – 2023-06-16 (×2): 80 mg via ORAL
  Filled 2023-06-15 (×2): qty 2

## 2023-06-15 MED ORDER — HYDRALAZINE HCL 20 MG/ML IJ SOLN: 5.0000 mg | Freq: Four times a day (QID) | INTRAMUSCULAR | Status: DC | PRN

## 2023-06-15 MED ORDER — BISMUTH SUBSALICYLATE 262 MG PO CHEW
524.0000 mg | CHEWABLE_TABLET | Freq: Every day | ORAL | Status: DC | PRN
  Filled 2023-06-15: qty 2

## 2023-06-15 MED ORDER — LEVOTHYROXINE SODIUM 100 MCG PO TABS
200.0000 ug | ORAL_TABLET | Freq: Every day | ORAL | Status: DC
  Administered 2023-06-16 – 2023-06-19 (×4): 200 ug via ORAL
  Filled 2023-06-15 (×4): qty 2

## 2023-06-15 MED ORDER — NITROGLYCERIN 0.4 MG SL SUBL: 0.4000 mg | SUBLINGUAL_TABLET | SUBLINGUAL | Status: DC | PRN

## 2023-06-15 MED ORDER — FUROSEMIDE 20 MG PO TABS: 40.0000 mg | ORAL_TABLET | Freq: Every day | ORAL | Status: DC

## 2023-06-15 MED ORDER — FUROSEMIDE 10 MG/ML IJ SOLN
40.0000 mg | Freq: Two times a day (BID) | INTRAMUSCULAR | Status: DC
  Administered 2023-06-16 – 2023-06-18 (×4): 40 mg via INTRAVENOUS
  Filled 2023-06-15 (×5): qty 4

## 2023-06-15 MED ORDER — ACETAMINOPHEN 650 MG RE SUPP: 650.0000 mg | Freq: Four times a day (QID) | RECTAL | Status: DC | PRN

## 2023-06-15 MED ORDER — ONDANSETRON HCL 4 MG/2ML IJ SOLN: 4.0000 mg | Freq: Four times a day (QID) | INTRAMUSCULAR | Status: DC | PRN

## 2023-06-15 MED ORDER — ONDANSETRON HCL 4 MG PO TABS
4.0000 mg | ORAL_TABLET | Freq: Four times a day (QID) | ORAL | Status: DC | PRN
Start: 2023-06-15 — End: 2023-06-19

## 2023-06-15 MED ORDER — LORAZEPAM 2 MG/ML IJ SOLN
0.5000 mg | Freq: Four times a day (QID) | INTRAMUSCULAR | Status: DC | PRN
  Administered 2023-06-16: 0.5 mg via INTRAVENOUS
  Filled 2023-06-15: qty 1

## 2023-06-15 MED ORDER — SODIUM CHLORIDE 0.9% FLUSH
3.0000 mL | Freq: Two times a day (BID) | INTRAVENOUS | Status: DC
  Administered 2023-06-16 – 2023-06-18 (×6): 3 mL via INTRAVENOUS

## 2023-06-15 MED ORDER — DIPHENHYDRAMINE HCL 25 MG PO CAPS
50.0000 mg | ORAL_CAPSULE | Freq: Once | ORAL | Status: AC
  Administered 2023-06-15: 50 mg via ORAL
  Filled 2023-06-15: qty 2

## 2023-06-15 MED ORDER — SODIUM CHLORIDE 0.9 % IV SOLN
250.0000 mL | INTRAVENOUS | Status: AC | PRN
Start: 2023-06-15 — End: 2023-06-16

## 2023-06-15 MED ORDER — METOPROLOL SUCCINATE ER 25 MG PO TB24
25.0000 mg | ORAL_TABLET | Freq: Every day | ORAL | Status: DC
  Administered 2023-06-16 – 2023-06-19 (×4): 25 mg via ORAL
  Filled 2023-06-15 (×4): qty 1

## 2023-06-15 MED ORDER — IOHEXOL 350 MG/ML SOLN
75.0000 mL | Freq: Once | INTRAVENOUS | Status: AC | PRN
  Administered 2023-06-15: 75 mL via INTRAVENOUS

## 2023-06-15 MED ORDER — TAMSULOSIN HCL 0.4 MG PO CAPS
0.4000 mg | ORAL_CAPSULE | Freq: Every day | ORAL | Status: DC
  Administered 2023-06-15 – 2023-06-18 (×4): 0.4 mg via ORAL
  Filled 2023-06-15 (×4): qty 1

## 2023-06-15 MED ORDER — BISMUTH SUBSALICYLATE 262 MG PO TABS: 524.0000 mg | ORAL_TABLET | Freq: Every day | ORAL | Status: DC | PRN

## 2023-06-15 NOTE — ED Triage Notes (Signed)
Pt with wife who reports recent cardiac surgeries in Nov. Since being discharged, wife has noticed pt having memory lapses and appearing altered at times. Pt endorses significant anxiety and acknowledges memory issues. Pt denies trauma. No LOC changes or seizure like activity per family.

## 2023-06-15 NOTE — ED Provider Triage Note (Cosign Needed Addendum)
Emergency Medicine Provider Triage Evaluation Note  Joseph [name]. , a 57 y.o. male  was evaluated in triage.  Pt complains of memory problems.  Review of Systems  Positive:  Negative:   Physical Exam  BP (!) 136/100 (BP Location: Right Arm)   Pulse 69   Temp 97.7 F (36.5 C)   Resp 16   SpO2 95%  Gen:   Awake, no distress   Resp:  Normal effort  MSK:   Moves extremities without difficulty  Other:    Medical Decision Making  Medically screening exam initiated at 12:31 PM.  Appropriate orders placed.  Joseph [name]. was informed that the remainder of the evaluation will be completed by another provider, this initial triage assessment does not replace that evaluation, and the importance of remaining in the ED until their evaluation is complete.  Memory problems/intermittent confusion that has been happening since CABG and myectomy (04/2023). Patient did have a TIA 3 days after the heart surgery but did not have MRI at that time. Family member at beside stating that symptoms have been getting progressively worse since the surgery. Patient also with panic attack yesterday. Family concerned because patient woke up at 4AM and "couldn't sit still" and was acting "spaced out" yesterday and forgetting how to work his phone.   Apparently patient was hooked up to a lot of cords after heart surgery so the providers did not want to put patient in the MRI scanner - per family member.   Denies fever, chest pain, dyspnea, cough, nausea, vomiting, diarrhea.    Dorthy Cooler, [place] 06/15/23 1236   Apparently patient started getting itchy in the MRI scanner - no contrast was given yet per tech. Nurse giving patient benadryl.   Dorthy Cooler, [place] 06/15/23 1523

## 2023-06-15 NOTE — Consult Note (Signed)
Cardiology Consult:   Patient ID: Joseph [name].; MRN: [id number]; DOB: 1965-10-12   Admission date: 06/15/2023  Primary Care Provider: Richardean Chimera, MD Primary Cardiologist: Riley Lam MD  Chief Complaint:  Stroke s/p myectomy  Patient Profile:   Joseph [name]. is a 57 y.o. male with a hx of  CAD with prior PCI since 2011. Chronic lower back pain.  Complicated by oHCM. At last evaluation found to have a severe LVOT gradient. Given his NYHA III symptoms, a LHC was performed in 2024.  While supine, there was no significant LVOT obstruction.  Given his symptoms, he was referred to HCM clinic for the evaluation of cardiac myosin inhibitors. 2024: tried CMI therapy for one week: he felt that it was affecting his oxycodone. He has called in lately with low blood pressure.  Summer he called in with near syncopal events: no NSVT on heart monitor.  Had Myectomy 04/2023.  History of Present Illness:   [name], a 57 year old with a complex cardiac history including coronary artery disease status post PPCI and RCA CABG, obstructive hypertrophic cardiomyopathy status post myectomy, and non-AL, non-ATTR cardiac amyloidosis, presents with altered mental status and personality changes. These changes were first noted around Christmas, prompting an MRI and emergency room visit. In the ER, multiple subacute strokes were identified.  Prior to this, the patient had been recovering slowly post-myectomy, with some evidence of hypoxic respiratory failure that improved with diuretics. He also has known obstructive sleep apnea. Recently, he ran out of Lasix, leading to leg swelling. Random episodes of dizziness were also reported.  On examination, a systolic murmur and lower extremity edema were noted. No neurological deficits were identified. The patient's telemetry showed a sinus rhythm with a left bundle branch block, which was present prior to the myectomy. The patient's ejection fraction  was found to be reduced to about 45-50% post-myectomy.  The patient's affect was noted to be markedly different and absent, a change that was also observed by his spouse during a recent restaurant visit. Despite these changes, the patient remains aware of his surroundings with no vocal deficit.  Allergies:    Allergies  Allergen Reactions   Alpha-Gal Anaphylaxis    Beef, pork   Lisinopril Anaphylaxis   Penicillins Anaphylaxis and Other (See Comments)    **CEFAZOLIN received on 02/07/2014 and 04/08/2015 with no documented ADRs**.  PCN reaction causing immediate rash, facial/tongue/throat swelling, SOB or lightheadedness with hypotension: unknown PCN reaction causing severe rash involving mucus membranes or skin necrosis: unknown PCN reaction that required hospitalization unknown PCN reaction occurring within the last 10 years: no If all of the above answers are "NO", then may proceed with Cephalosporin use.    Gadavist [Gadobutrol] Nausea And Vomiting    Pt vomits with Gadavist contrast 09/14/2022 during cardiac MRI scan    Morphine Other (See Comments)    Panic attacks   Tetanus Toxoid Other (See Comments)    REACTION: unknown    Social History:   Social History   Socioeconomic History   Marital status: Married    Spouse name: [name]   Number of children: Not on file   Years of education: Not on file   Highest education level: Not on file  Occupational History   Occupation: Full Time Psychologist, prison and probation services: [employer]  Tobacco Use   Smoking status: Former    Current packs/day: 0.00    Average packs/day: 1.5 packs/day for 15.0 years (22.5 ttl  pk-yrs)    Types: Cigarettes    Start date: 04/20/1988    Quit date: 04/21/2003    Years since quitting: 20.1   Smokeless tobacco: Never   Tobacco comments:    Pt states that he chews on cigars.  Vaping Use   Vaping status: Never Used  Substance and Sexual Activity   Alcohol use: No   Drug use: No   Sexual  activity: Yes  Other Topics Concern   Not on file  Social History Narrative   Not on file   Social Drivers of Health   Financial Resource Strain: Low Risk  (12/31/2020)   Received from [facility], [facility]   Overall Financial Resource Strain (CARDIA)    Difficulty of Paying Living Expenses: Not hard at all  Food Insecurity: No Food Insecurity (05/12/2023)   Received from [facility]   Hunger Vital Sign    Worried About Running Out of Food in the Last Year: Never true    Ran Out of Food in the Last Year: Never true  Transportation Needs: No Transportation Needs (05/12/2023)   Received from [facility] - Transportation    Lack of Transportation (Medical): No    Lack of Transportation (Non-Medical): No  Physical Activity: Inactive (12/31/2020)   Received from [facility], [facility]   Exercise Vital Sign    Days of Exercise per Week: 0 days    Minutes of Exercise per Session: 0 min  Stress: No Stress Concern Present (12/31/2020)   Received from [facility] - Occupational Stress Questionnaire    Feeling of Stress : Not at all  Social Connections: Unknown (11/01/2021)   Received from [facility], [facility]   Social Network    Social Network: Not on file  Intimate Partner Violence: Unknown (09/23/2021)   Received from [facility], [facility]   HITS    Physically Hurt: Not on file    Insult or Talk Down To: Not on file    Threaten Physical Harm: Not on file    Scream or Curse: Not on file    Family History:   The patient's family history includes Cancer in his mother; Diabetes in his father; Hypertension in an other family member; Kidney disease in his father; Thyroid disease in his mother and sister. There is no history of Angioedema, Asthma, Atopy, Eczema, Immunodeficiency, Urticaria, or Allergic rhinitis.    ROS:  Please see the history of present illness.   Physical  Exam/Data:   Vitals:   06/15/23 1222 06/15/23 1724  BP: (!) 136/100 125/79  Pulse: 69 65  Resp: 16 18  Temp: 97.7 F (36.5 C) 98.5 F (36.9 C)  TempSrc:  Oral  SpO2: 95% 90%   No intake or output data in the 24 hours ending 06/15/23 2051 There were no vitals filed for this visit. There is no height or weight on file to calculate BMI.   GEN: No distress, obesity HEENT: Normal NECK: No JVD CARDIAC: regular rhythm, no murmur no rubs, gallops RESPIRATORY:  CTAB ABDOMEN: Soft, non-tender, non-distended MUSCULOSKELETAL:  +1 bilateral edema; No deformity  SKIN: Warm and dry NEUROLOGIC:  Alert and oriented x 3 PSYCHIATRIC:  Flat affect    EKG:  The ECG that was done  was personally reviewed and demonstrates LBBB  Relevant CV Studies:  Cardiac Studies & Procedures   CARDIAC CATHETERIZATION  CARDIAC CATHETERIZATION 08/15/2022  Narrative 1.  Moderate nonobstructive proximal RCA  stenosis of 50 to 60%, RFR evaluation performed and is equal to 0.95 2.  Patent left main, LAD, and left circumflex with mild in-stent restenosis in the mid LAD stent, no obstruction in the left main or circumflex 3.  Essentially normal right heart pressures, normal LVEDP of 14 mmHg, mean PA pressure 22 mmHg, RA pressure mean of 5.  Cardiac output 6.5, cardiac index 2.7. 4.  No significant pullback gradient from the mid left ventricle to the aorta with a peak gradient of 5 mmHg  Recommend: Medical therapy for nonobstructive CAD, continue medical therapy for potential hypertrophic cardiomyopathy.  Refer to HCM specialist for discussion of further management options.  Of note, patient had a large gradient by echo assessment not demonstrated by catheter pullback.  Findings Coronary Findings Diagnostic  Dominance: Right  Left Main The vessel exhibits minimal luminal irregularities. Mid LM lesion is 25% stenosed. The lesion is eccentric. The lesion is mildly calcified.  Left Anterior Descending There is  mild diffuse disease throughout the vessel. Prox LAD to Mid LAD lesion is 40% stenosed. The lesion was previously treated over 2 years ago.  Left Circumflex The vessel exhibits minimal luminal irregularities.  Right Coronary Artery Vessel is large. There is mild diffuse disease throughout the vessel. Prox RCA lesion is 60% stenosed. The lesion is eccentric. The lesion is moderately calcified.  Intervention  No interventions have been documented.    ECHOCARDIOGRAM  ECHOCARDIOGRAM LIMITED 06/12/2023  Narrative ECHOCARDIOGRAM LIMITED REPORT    Patient Name:   Flay Ribera. Date of Exam: 06/12/2023 Medical Rec #:  [id number]         Height:       72.0 in Accession #:    [id number]        Weight:       278.0 lb Date of Birth:  [birth year]          BSA:          2.451 m Patient Age:    57 years          BP:           134/87 mmHg Patient Gender: M                 HR:           52 bpm. Exam Location:  [facility]  Procedure: Limited Echo, Limited Color Doppler and Cardiac Doppler  Indications:    I31.39 Pericardial Effusion  History:        Patient has prior history of Echocardiogram examinations, most recent 06/28/2022. HOCM, CAD; Risk Factors:Sleep Apnea and HLD. Cardiac Amyloidosis.  Sonographer:    Clearence Ped RCS Referring Phys: [id number] Xayden Linsey A Ardine Iacovelli  IMPRESSIONS   1. Left ventricular ejection fraction, by estimation, is 45 to 50%. The left ventricle has mildly decreased function. The left ventricle demonstrates regional wall motion abnormalities. Abnormal (paradoxical) septal motion, consistent with left bundle branch block. There is moderate left ventricular hypertrophy. 2. Right ventricular systolic function is normal. The right ventricular size is normal. 3. Moderate pericardial effusion. There is no evidence of cardiac tamponade. 4. The mitral valve is normal in structure. Trivial mitral valve regurgitation. 5. The aortic valve is tricuspid. Aortic  valve regurgitation is trivial. No aortic stenosis is present.  FINDINGS Left Ventricle: Left ventricular ejection fraction, by estimation, is 45 to 50%. The left ventricle has mildly decreased function. The left ventricle demonstrates regional wall motion abnormalities. The left ventricular internal cavity size was normal  in size. There is moderate left ventricular hypertrophy. Abnormal (paradoxical) septal motion, consistent with left bundle branch block.  Right Ventricle: The right ventricular size is normal. No increase in right ventricular wall thickness. Right ventricular systolic function is normal.  Pericardium: A moderately sized pericardial effusion is present. There is no evidence of cardiac tamponade.  Mitral Valve: The mitral valve is normal in structure. Trivial mitral valve regurgitation.  Aortic Valve: The aortic valve is tricuspid. Aortic valve regurgitation is trivial. No aortic stenosis is present.  Pulmonic Valve: The pulmonic valve was not well visualized. Pulmonic valve regurgitation is not visualized.  Aorta: The aortic root and ascending aorta are structurally normal, with no evidence of dilitation.  LEFT VENTRICLE PLAX 2D LVIDd:         5.10 cm   Diastology LVIDs:         3.80 cm   LV e' medial:    2.83 cm/s LV PW:         1.50 cm   LV E/e' medial:  21.5 LV IVS:        1.40 cm   LV e' lateral:   7.94 cm/s LVOT diam:     2.20 cm   LV E/e' lateral: 7.7 LV SV:         103 LV SV Index:   42 LVOT Area:     3.80 cm   LEFT ATRIUM         Index LA diam:    4.70 cm 1.92 cm/m AORTIC VALVE LVOT Vmax:   125.00 cm/s LVOT Vmean:  87.900 cm/s LVOT VTI:    0.271 m  AORTA Ao Root diam: 3.90 cm Ao Asc diam:  3.60 cm  MITRAL VALVE MV Area (PHT):             SHUNTS MV Decel Time:             Systemic VTI:  0.27 m MV E velocity: 60.80 cm/s  Systemic Diam: 2.20 cm MV A velocity: 81.20 cm/s MV E/A ratio:  0.75  Epifanio Lesches MD Electronically signed by  Epifanio Lesches MD Signature Date/Time: 06/12/2023/5:31:22 PM    Final  TEE  ECHO TEE 04/14/2023  Narrative TRANSESOPHOGEAL ECHO REPORT    Patient Name:   Tayquan Decou. Date of Exam: 04/14/2023 Medical Rec #:  [id number]         Height:       72.0 in Accession #:    [id number]        Weight:       270.0 lb Date of Birth:  [birth year]          BSA:          2.420 m Patient Age:    57 years          BP:           117/68 mmHg Patient Gender: M                 HR:           52 bpm. Exam Location:  Inpatient  Procedure: Transesophageal Echo, Color Doppler, Cardiac Doppler and 3D Echo  MODIFIED REPORT: This report was modified by Riley Lam MD on 04/17/2023 due to Spelling. Indications:     Hypertrophic Cardiomyopathy  History:         Patient has prior history of Echocardiogram examinations, most recent 06/28/2022. CAD, Thyroid Disease; Risk Factors:Hypertension, Sleep Apnea and Dyslipidemia.  Sonographer:  Milbert Coulter Referring Phys:  [id number] [name] Richardean Chimera Diagnosing Phys: Riley Lam MD   Sonographer Comments: TEE probe [phone] utilized for procedure.   PROCEDURE: After discussion of the risks and benefits of a TEE, an informed consent was obtained from the patient. The transesophogeal probe was passed without difficulty through the esophogus of the patient. Imaged were obtained with the patient in a left lateral decubitus position. Sedation performed by different physician. The patient was monitored while under deep sedation. Anesthestetic sedation was provided intravenously by Anesthesiology: 449.35mg  of Propofol, 100mg  of Lidocaine. Image quality was good. The patient's vital signs; including heart rate, blood pressure, and oxygen saturation; remained stable throughout the procedure. Supplementary images were obtained from transthoracic windows as indicated to answer the clinical question. The patient developed no complications during the  procedure.  IMPRESSIONS   1. Fasting Valsalva LVOT gradient 29 mmHg. Maximal septal thickness 24 mm, basal septum. Left ventricular ejection fraction, by estimation, is 60 to 65%. The left ventricle has normal function. There is severe asymmetric left ventricular hypertrophy of the septal segment. 2. Right ventricular systolic function is normal. The right ventricular size is normal. 3. Left atrial size was mildly dilated. No left atrial/left atrial appendage thrombus was detected. 4. Mild regurgitation not related to systolic anterior motion of the mitral valve, but related to elongated papillary muscle; 3D asessement confirms central, A2-P2 jet. The mitral valve is abnormal. Mild mitral valve regurgitation. No evidence of mitral stenosis. 5. The aortic valve is tricuspid. Aortic valve regurgitation is not visualized. Aortic valve sclerosis is present, with no evidence of aortic valve stenosis. 6. Cannot exclude a small PFO.  FINDINGS Left Ventricle: Fasting Valsalva LVOT gradient 29 mmHg. Maximal septal thickness 24 mm, basal septum. Left ventricular ejection fraction, by estimation, is 60 to 65%. The left ventricle has normal function. The left ventricular internal cavity size was normal in size. There is severe asymmetric left ventricular hypertrophy of the septal segment.  Right Ventricle: The right ventricular size is normal. No increase in right ventricular wall thickness. Right ventricular systolic function is normal.  Left Atrium: Left atrial size was mildly dilated. No left atrial/left atrial appendage thrombus was detected.  Right Atrium: Right atrial size was normal in size.  Pericardium: There is no evidence of pericardial effusion.  Mitral Valve: Mild regurgitation not related to systolic anterior motion of the mitral valve, but related to elongated papillary muscle; 3D asessement confirms central, A2-P2 jet. The mitral valve is abnormal. Mild mitral valve regurgitation. No  evidence of mitral valve stenosis.  Tricuspid Valve: The tricuspid valve is normal in structure. Tricuspid valve regurgitation is not demonstrated. No evidence of tricuspid stenosis.  Aortic Valve: The aortic valve is tricuspid. Aortic valve regurgitation is not visualized. Aortic valve sclerosis is present, with no evidence of aortic valve stenosis. Aortic valve mean gradient measures 6.0 mmHg. Aortic valve peak gradient measures 12.4 mmHg.  Pulmonic Valve: The pulmonic valve was normal in structure. Pulmonic valve regurgitation is trivial. No evidence of pulmonic stenosis.  Aorta: The aortic root, ascending aorta, aortic arch and descending aorta are all structurally normal, with no evidence of dilitation or obstruction.  Pulmonary Artery: The pulmonary artery is of normal size.  IAS/Shunts: Cannot exclude a small PFO.  Additional Comments: Spectral Doppler performed.  LEFT VENTRICLE PLAX 2D LVIDd:         4.00 cm LV PW:         1.90 cm LV IVS:  2.40 cm   AORTIC VALVE AV Vmax:           176.00 cm/s AV Vmean:          113.000 cm/s AV VTI:            0.340 m AV Peak Grad:      12.4 mmHg AV Mean Grad:      6.0 mmHg LVOT Vmax:         167.00 cm/s LVOT Vmean:        102.000 cm/s LVOT VTI:          0.337 m LVOT/AV VTI ratio: 0.99   SHUNTS Systemic VTI: 0.34 m  Riley Lam MD Electronically signed by Riley Lam MD Signature Date/Time: 04/14/2023/5:08:27 PM    Final (Updated)  MONITORS  CARDIAC EVENT MONITOR 11/24/2022  Narrative   Patient had a minimum heart rate of 40 bpm (nocturnal), maximum heart rate of 120 bpm, and average heart rate of 62 bpm.   Predominant underlying rhythm was sinus rhythm.   No NSVT. No atrial fibrillation or flutter.   Isolated PACs were rare (<1.0%).   Isolated PVCs were rare (<1.0%).   First degree heart block noted.   Triggered and diary events associated with sinus rhythm.  No malignant arrhythmias   CARDIAC  MRI  MR CARDIAC MORPHOLOGY W WO CONTRAST 09/14/2022  Narrative CLINICAL DATA:  HCM  EXAM: CARDIAC MRI  TECHNIQUE: The patient was scanned on a 1.5 Tesla Siemens magnet. A dedicated cardiac coil was used. Functional imaging was done using Fiesta sequences. 2,3, and 4 chamber views were done to assess for RWMA's. Modified Simpson's rule using a short axis stack was used to calculate an ejection fraction on a dedicated work [employer]. The patient received 15 cc of Gadavist. After 10 minutes inversion recovery sequences were used to assess for infiltration and scar tissue. Velocity flow mapping performed in the ascending aorta and main pulmonary artery.  CONTRAST:  15 cc  of Gadavist  FINDINGS: 1. Normal left ventricular cavity size, severe asymmetric basal septal hypertrophy. LV septal wall measures upto 18mm in thickness.  Normal LV systolic function (LVEF = 62%).  There is systolic anterior motion (SAM) of mitral valve causing LV outflow tract obstruction.  There are no regional wall motion abnormalities.  There is Midwall scar/LGE in the lateral LV wall, mid to apical segments.  LGE/scar comprises 9 g, about 4% of total LV myocardial mass.  LVEDV: 198 ml  LVESV: 76 ml  SV: 122 ml  CO:4 L/min  Myocardial mass: 242g  2. Normal right ventricular size, thickness and systolic function (RVEF = 54%). There are no regional wall motion abnormalities.  3.  Normal left and right atrial size.  4. Normal size of the aortic root, ascending aorta and pulmonary artery.  5.  Mild mitral regurgitation.  6.  Normal pericardium.  trivial pericardial effusion.  IMPRESSION: 1. Normal LV function.  LVEF 62%  2. Severe asymmetric LV basal septal hypertrophy, measuring upto 18 mm in maximal thickness.  3. There is systolic anterior motion (SAM) of mitral valve causing LV outflow tract obstruction.  4. Midwall scar/LGE in the lateral LV wall, mid to  apical segments.  5. LGE/scar comprises 9 g, about 4% of total LV myocardial mass.  6. Normal RV size and function.  7. 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   Electronically Signed By: Debbe Odea M.D. On: 09/14/2022 13:55  PYP SCAN  MYOCARDIAL AMYLOID PLANAR AND  SPECT 06/09/2023  Narrative   Myocardial uptake was negative for radiotracer uptake. The visual grade of myocardial uptake relative to the ribs was Grade 0 (No myocardial uptake and normal bone uptake).   Findings are not suggestive (Grade 0) of cardiac ATTR amyloidosis.   Prior study not available for comparison.   H/CL ratio < 1.5 measuring 1.37 with background correction        Laboratory Data:  Chemistry Recent Labs  Lab 06/15/23 1235  NA 139  K 3.9  CL 101  CO2 29  GLUCOSE 109*  BUN 8  CREATININE 0.79  CALCIUM 8.5*  GFRNONAA >60  ANIONGAP 9    Recent Labs  Lab 06/15/23 1235  PROT 5.8*  ALBUMIN 2.2*  AST 50*  ALT 37  ALKPHOS 107  BILITOT 0.5   Hematology Recent Labs  Lab 06/15/23 1235  WBC 9.0  RBC 5.12  HGB 16.4  HCT 48.3  MCV 94.3  MCH 32.0  MCHC 34.0  RDW 13.5  PLT 267   Cardiac EnzymesNo results for input(s): "TROPONINI" in the last 168 hours. No results for input(s): "TROPIPOC" in the last 168 hours.  BNPNo results for input(s): "BNP", "PROBNP" in the last 168 hours.  DDimer No results for input(s): "DDIMER" in the last 168 hours.   Assessment and Plan:   Multiple Subacute Strokes Postoperative Atrial Fibrillation [facility] elected to not start AC (CHADSVASC NA) because they felt his AF was all post operative - continue amiodarone and ASA as per neurology for now 0 Presented with altered mental status and personality changes. MRI identified multiple subacute strokes. No vocal deficits but markedly absent affect. Neurology and internal medicine teams involved. - will need live heart monitor at DC - If there is any concern that  his stroke are embolic in nature, I would factor starting DOAC at discharge if cleared by neuro  Hypertrophic Cardiomyopathy Status post myectomy with reduced ejection fraction (45-50%) and left bundle branch block. Sinus rhythm with left bundle branch block on telemetry. Discussed goal-directed medical therapy and plan to start SGLT2 inhibitor prior to discharge if possible. - Continue metoprolol - Continue telemetry monitoring - Start SGLT2 inhibitor prior to discharge if possible - Administer Lasix 40 mg IV BID starting tomorrow  Cardiac Amyloidosis Non-AL, non-ATTR cardiac amyloidosis confirmed by biopsy, contributing to reduced ejection fraction and overall cardiac function. - outpatient plan for PET and antibody clinical trial consideration   Hypoxic Respiratory Failure Post-myectomy, improved with diuretics. Currently well-managed. - at discharge will need assessment home O2.  This admission may delay his cardiac rehab start  Obstructive Sleep Apnea Known history of obstructive sleep apnea. - in patient CPAP ordered   For questions or updates, please contact [facility] Please consult [email or web address] for contact info under Cardiology/STEMI.   Riley Lam, MD FASE [facility] Cardiologist [facility]  [address] [place], [address] [place], [place] [address] [phone]  8:51 PM

## 2023-06-15 NOTE — Telephone Encounter (Signed)
Patient's daughter called to say that her father was acting strange on Christmas eve.  She asked him how he was, and he was stuttering but was finally able to get out that he was okay.  During dinner he kept saying that the dinner was good and the lasagna was made with beef even though it was made with [place] and they had told him that.  Then they were opening gifts and he was just emotionless, she states he face as just like "stone".  He was getting anxious so his friend took him for a drive and he was trying to work his phone and didn't remember how to use it.   This morning he woke up very anxious and was telling his wife that he has to get out and go. This was this morning at 4am, he just left without his phone, but he came back home.  However, now he is just pacing back and forth saying he is going to die.   Daughter is wondering if he might have had a TIA.  Wife and daughter would like for an MRI to be ordered if so he would have to be sedated as he is very anxious and claustrophobic.

## 2023-06-15 NOTE — ED Provider Notes (Signed)
[facility] Provider Note   CSN: [id number] Arrival date & time: 06/15/23  1208     History  Chief Complaint  Patient presents with   Abdominal Pain   Anxiety    Joseph [name]. is a 57 y.o. male.   Abdominal Pain Anxiety Associated symptoms include abdominal pain.     Patient has history of hyperlipidemia coronary artery disease, hypertension, angioedema, hypertrophic obstructive cardiomyopathy, cardiac amyloidosis.  Patient was traveling and was admitted to a hospital in November.  Patient underwentCABG as well as a myomectomy while in the hospital family states he had a possible TIA.  Since he left the hospital that month.  Patient has been having episodes of memory lapses he will seem intermittently confused.  He has been having more trouble with feeling anxious at times and having trouble with his memory.  No seizure no loss of consciousness.  Family was told these could be signs of a possible stroke so he came to the ED for evaluation.  Home Medications Prior to Admission medications   Medication Sig Start Date End Date Taking? Authorizing Provider  amiodarone (PACERONE) 200 MG tablet Take 200 mg by mouth daily. Take 1 tablet by mouth twice daily for 7 days, then take 1 tablet for 28 days. 05/17/23 06/21/23  [provider]  Ascorbic Acid (VITAMIN C) 1000 MG tablet Take 1,000 mg by mouth daily.    [provider]  aspirin 81 MG tablet Take 81 mg by mouth daily.    [provider]  Bismuth Subsalicylate (PEPTO-BISMOL) 262 MG TABS Take 524 mg by mouth daily as needed (indigestion).    [provider]  busPIRone (BUSPAR) 15 MG tablet Take 15 mg by mouth 2 (two) times daily.    [provider]  Cholecalciferol (VITAMIN D) 50 MCG (2000 UT) tablet Take 2,000 Units by mouth daily.    [provider]  cyanocobalamin (VITAMIN B12) 1000 MCG tablet Take 1,000 mcg by mouth daily.     [provider]  diphenhydramine-acetaminophen (TYLENOL PM) 25-500 MG TABS tablet Take 2 tablets by mouth at bedtime as needed (sleep).    [provider]  EPINEPHrine (EPIPEN 2-PAK) 0.3 mg/0.3 mL IJ SOAJ injection Inject 0.3 mLs (0.3 mg total) into the muscle as needed for anaphylaxis. 12/16/19   Ellamae Sia, DO  furosemide (LASIX) 40 MG tablet Take 40 mg by mouth daily.    [provider]  levothyroxine (SYNTHROID) 200 MCG tablet Take 1 tablet (200 mcg total) by mouth daily before breakfast. 06/12/23   Dani Gobble, NP  LORazepam (ATIVAN) 1 MG tablet Take 1 hour prior to Brain MRI 06/15/23   Riley Lam A, MD  metoprolol succinate (TOPROL-XL) 25 MG 24 hr tablet Take 1 tablet by mouth daily. 05/17/23   [provider]  Multiple Vitamins-Minerals (MULTIVITAMIN WITH MINERALS) tablet Take 1 tablet by mouth daily.    [provider]  nitroGLYCERIN (NITROSTAT) 0.4 MG SL tablet Place 0.4 mg under the tongue every 5 (five) minutes as needed for chest pain.    [provider]  Oxycodone HCl 20 MG TABS Take 1 tablet (20 mg total) by mouth as directed. Patient taking differently: Take 20 mg by mouth 5 (five) times daily as needed (pain). 01/05/22   Campbell Lerner, MD  pravastatin (PRAVACHOL) 80 MG tablet Take 80 mg by mouth daily.    [provider]  pyridoxine (B-6) 100 MG tablet Take 100  mg by mouth daily.    [provider]  tamsulosin (FLOMAX) 0.4 MG CAPS capsule Take 0.4 mg by mouth at bedtime. 05/17/23 06/16/23  [provider]      Allergies    Alpha-gal, Lisinopril, Penicillins, Gadavist [gadobutrol], Morphine, and Tetanus toxoid    Review of Systems   Review of Systems  Gastrointestinal:  Positive for abdominal pain.    Physical Exam Updated Vital Signs BP 125/79 (BP Location: Left Arm)   Pulse 65   Temp 98.5 F (36.9 C) (Oral)   Resp 18   SpO2 90%  Physical Exam Vitals and nursing note  reviewed.  Constitutional:      General: He is not in acute distress.    Appearance: He is well-developed.  HENT:     Head: Normocephalic and atraumatic.     Right Ear: External ear normal.     Left Ear: External ear normal.  Eyes:     General: No scleral icterus.       Right eye: No discharge.        Left eye: No discharge.     Conjunctiva/sclera: Conjunctivae normal.  Neck:     Trachea: No tracheal deviation.  Cardiovascular:     Rate and Rhythm: Normal rate and regular rhythm.  Pulmonary:     Effort: Pulmonary effort is normal. No respiratory distress.     Breath sounds: Normal breath sounds. No stridor. No wheezing or rales.  Abdominal:     General: Bowel sounds are normal. There is no distension.     Palpations: Abdomen is soft.     Tenderness: There is no abdominal tenderness. There is no guarding or rebound.  Musculoskeletal:        General: No tenderness or deformity.     Cervical back: Neck supple.  Skin:    General: Skin is warm and dry.     Findings: No rash.  Neurological:     General: No focal deficit present.     Mental Status: He is alert.     Cranial Nerves: No cranial nerve deficit, dysarthria or facial asymmetry.     Sensory: No sensory deficit.     Motor: No abnormal muscle tone or seizure activity.     Coordination: Coordination normal.     Comments: Normal speech, no facial droop noted, normal strength sensation bilateral upper extremity lower extremities  Psychiatric:        Mood and Affect: Mood normal.     ED Results / Procedures / Treatments   Labs (all labs ordered are listed, but only abnormal results are displayed) Labs Reviewed  COMPREHENSIVE METABOLIC PANEL - Abnormal; Notable for the following components:      Result Value   Glucose, Bld 109 (*)    Calcium 8.5 (*)    Total Protein 5.8 (*)    Albumin 2.2 (*)    AST 50 (*)    All other components within normal limits  LIPASE, BLOOD  CBC  URINALYSIS, ROUTINE W REFLEX MICROSCOPIC   CBG MONITORING, ED    EKG EKG Interpretation Date/Time:  Thursday June 15 2023 12:24:40 EST Ventricular Rate:  71 PR Interval:  190 QRS Duration:  176 QT Interval:  478 QTC Calculation: 519 R Axis:   58  Text Interpretation: Normal sinus rhythm Left bundle branch block Abnormal ECG When compared with ECG of 30-May-2023 16:41, PREVIOUS ECG IS PRESENT no sig change from previous Confirmed by Arby Barrette [phone]) on 06/15/2023 12:31:12 PM  Radiology MR  BRAIN WO CONTRAST Result Date: 06/15/2023 CLINICAL DATA:  Provided history: Mental status change, unknown cause. EXAM: MRI HEAD WITHOUT CONTRAST TECHNIQUE: Multiplanar, multiecho pulse sequences of the brain and surrounding structures were obtained without intravenous contrast. COMPARISON:  Head CT 05/01/2023. FINDINGS: The patient was unable to tolerate the full examination. Only axial and coronal diffusion-weighted sequences could be acquired. 6 mm focus of diffusion-weighted signal abnormality within the posterior left frontal lobe white matter (series 2, image 37). 4 mm focus of diffusion-weighted signal abnormality within the right parietal lobe white matter (series 2, image 36). Subacute infarct within the left thalamus, new from the prior head CT of 05/01/2023. IMPRESSION: 1. Prematurely terminated examination consisting of diffusion-weighted sequences only. 2. Two subcentimeter foci of white matter diffusion-weighted signal abnormality, one within the posterior left frontal lobe and the other within the right parietal lobe. These are compatible with acute/subacute infarcts. 3. Subacute infarct within the left thalamus, new from the prior head CT of 05/01/2023. Electronically Signed   By: Jackey Loge D.O.   On: 06/15/2023 16:39    Procedures Procedures    Medications Ordered in ED Medications  LORazepam (ATIVAN) tablet 1 mg (1 mg Oral Given 06/15/23 1417)  diphenhydrAMINE (BENADRYL) capsule 50 mg (50 mg Oral Given 06/15/23  1530)    ED Course/ Medical Decision Making/ A&P Clinical Course as of 06/15/23 1920  Thu Jun 15, 2023  1857 Case discussed with Dr Otelia Limes [JK]  1918 Case discussed with Dr. Janalyn Shy regarding admission [JK]    Clinical Course User Index [JK] Linwood Dibbles, MD                                 Medical Decision Making Problems Addressed: Cerebrovascular accident (CVA), unspecified mechanism [facility]): acute illness or injury that poses a threat to life or bodily functions  Amount and/or Complexity of Data Reviewed Labs: ordered. Decision-making details documented in ED Course.  Risk Decision regarding hospitalization.   Patient presented to the ED for evaluation of memory issues concern for possible stroke.  Patient did not have any signs of anemia.  No significant electrolyte abnormalities.  He did had an MRI of the brain that does show acute to subacute stroke.  I have consulted with Neurology and the medical service for admission, further treatment.  Findings discussed with patient and family.        Final Clinical Impression(s) / ED Diagnoses Final diagnoses:  Cerebrovascular accident (CVA), unspecified mechanism [facility])    Rx / DC Orders ED Discharge Orders     None         Linwood Dibbles, MD 06/15/23 1920

## 2023-06-15 NOTE — Telephone Encounter (Signed)
Spoke with daughter per DPR and she states since his visit with provider patient has not been the same. He has been confused and has been emotionless. He has been really anxious as well. She states  this morning he left the house in a panic and kept saying he is going to die.    She does not think he had a TIA. She was wondering if his symptoms are  side effects from his last TIA. She would like an MRI and stated patient would need a form of sedation.   She would like to know your thoughts on his behavior.
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
## 2023-06-15 NOTE — H&P (Addendum)
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 provider, please page the [facility] (Director on Call) for the Hospitalists listed on amion for assistance.  06/15/2023, 10:13 PM

## 2023-06-15 NOTE — ED Notes (Signed)
Pt brought back from MRI. Tech advised that pt was unable to complete imaging d/t claustrophobia despite pre medication. Pt advised that he was feeling itching sensation after MRI, although no contrast was given. PA [facility]. In triage and assessed pt. See MAR.

## 2023-06-15 NOTE — Telephone Encounter (Signed)
Called spoke with pt daughter [name].  Reports pt behavior is complete opposite from OV on 06/13/23 with Dr. Izora Ribas. Pt impulsive can't follow simple task, noted stuttering at times.   Advised daughter I have spoken with Dr. Izora Ribas who will order Brain MRI and referral to neurology.  Advised daughter of need for ED visit d/t reported symptoms.  Pt had recent open heart surgery at [facility].  Daughter reports O2 level 90% and above.      Placed order for MRI Brain and urgent referral to neurology.

## 2023-06-16 ENCOUNTER — Inpatient Hospital Stay (HOSPITAL_COMMUNITY): Payer: Medicare Other

## 2023-06-16 ENCOUNTER — Telehealth: Payer: Self-pay | Admitting: Internal Medicine

## 2023-06-16 DIAGNOSIS — I639 Cerebral infarction, unspecified: Secondary | ICD-10-CM | POA: Diagnosis not present

## 2023-06-16 DIAGNOSIS — I480 Paroxysmal atrial fibrillation: Secondary | ICD-10-CM

## 2023-06-16 DIAGNOSIS — I4891 Unspecified atrial fibrillation: Secondary | ICD-10-CM | POA: Diagnosis not present

## 2023-06-16 DIAGNOSIS — E859 Amyloidosis, unspecified: Secondary | ICD-10-CM | POA: Diagnosis not present

## 2023-06-16 DIAGNOSIS — I2510 Atherosclerotic heart disease of native coronary artery without angina pectoris: Secondary | ICD-10-CM | POA: Diagnosis not present

## 2023-06-16 LAB — COMPREHENSIVE METABOLIC PANEL
ALT: 29 U/L (ref 0–44)
AST: 36 U/L (ref 15–41)
Albumin: 1.6 g/dL — ABNORMAL LOW (ref 3.5–5.0)
Alkaline Phosphatase: 82 U/L (ref 38–126)
Anion gap: 6 (ref 5–15)
BUN: 6 mg/dL (ref 6–20)
CO2: 32 mmol/L (ref 22–32)
Calcium: 7.8 mg/dL — ABNORMAL LOW (ref 8.9–10.3)
Chloride: 102 mmol/L (ref 98–111)
Creatinine, Ser: 1 mg/dL (ref 0.61–1.24)
GFR, Estimated: >60 mL/min (ref >60)
Glucose, Bld: 103 mg/dL — ABNORMAL HIGH (ref 70–99)
Potassium: 3.5 mmol/L (ref 3.5–5.1)
Sodium: 140 mmol/L (ref 135–145)
Total Bilirubin: 0.6 mg/dL (ref <1.2)
Total Protein: 4.3 g/dL — ABNORMAL LOW (ref 6.5–8.1)

## 2023-06-16 LAB — CBC
HCT: 43 % (ref 39.0–52.0)
Hemoglobin: 14.2 g/dL (ref 13.0–17.0)
MCH: 31.5 pg (ref 26.0–34.0)
MCHC: 33 g/dL (ref 30.0–36.0)
MCV: 95.3 fL (ref 80.0–100.0)
Platelets: 208 10*3/uL (ref 150–400)
RBC: 4.51 MIL/uL (ref 4.22–5.81)
RDW: 13.7 % (ref 11.5–15.5)
WBC: 6.5 10*3/uL (ref 4.0–10.5)
nRBC: 0 % (ref 0.0–0.2)

## 2023-06-16 LAB — LIPID PANEL
Cholesterol: 256 mg/dL — ABNORMAL HIGH (ref 0–200)
HDL: 42 mg/dL (ref >40)
LDL Cholesterol: 179 mg/dL — ABNORMAL HIGH (ref 0–99)
Total CHOL/HDL Ratio: 6.1 {ratio}
Triglycerides: 174 mg/dL — ABNORMAL HIGH (ref <150)
VLDL: 35 mg/dL (ref 0–40)

## 2023-06-16 LAB — ECHOCARDIOGRAM COMPLETE
AR max vel: 2.84 cm2
AV Peak grad: 6.4 mm[Hg]
Ao pk vel: 1.26 m/s
Area-P 1/2: 2.83 cm2
S' Lateral: 3.6 cm

## 2023-06-16 MED ORDER — ROSUVASTATIN CALCIUM 20 MG PO TABS
40.0000 mg | ORAL_TABLET | Freq: Every day | ORAL | Status: DC
  Administered 2023-06-16 – 2023-06-19 (×4): 40 mg via ORAL
  Filled 2023-06-16 (×4): qty 2

## 2023-06-16 MED ORDER — DIPHENHYDRAMINE HCL 25 MG PO CAPS
25.0000 mg | ORAL_CAPSULE | Freq: Once | ORAL | Status: AC
Start: 2023-06-16 — End: 2023-06-16
  Administered 2023-06-16: 25 mg via ORAL
  Filled 2023-06-16: qty 1

## 2023-06-16 MED ORDER — EZETIMIBE 10 MG PO TABS
10.0000 mg | ORAL_TABLET | Freq: Every day | ORAL | Status: DC
  Administered 2023-06-16 – 2023-06-19 (×4): 10 mg via ORAL
  Filled 2023-06-16 (×4): qty 1

## 2023-06-16 NOTE — Plan of Care (Signed)

## 2023-06-16 NOTE — Progress Notes (Addendum)
STROKE TEAM PROGRESS NOTE   BRIEF HPI [name]. is a 57 y.o. male with history of chronic lower back pain, CAD s/p CABG and obstructive HCM s/p myectomy in November at [facility], acute on chronic HFrEF, non-AL, non-ATTR cardiac amyloidosis, and OSA presenting with confusion, altered mental status for a few days PTA with troubles completing familiar and simple tasks, panic attacks, some reported decrease in oxygen saturations at home. Neuroimaging obtained in the ED notable for subacute strokes and patient was admitted for further evaluation.   SIGNIFICANT HOSPITAL EVENTS - 12/26 MRI brain DWI sequences only (limited due to patient tolerance): Two subcentimeter foci of white matter diffusion-weighted signal abnormality within the posterior left frontal lobe and right parietal lobe consistent with acute/subacute infarcts as well as a subacute infarct within the left thalamus new from [facility] 05/01/23.   INTERIM HISTORY/SUBJECTIVE Some shortness of breath and hypoxia reported overnight on CPAP with improvement after oxygen administration  Neuro exam notable for some impaired/slowed processing, otherwise unremarkable   Patient's wife states that while at OSH following CABG November 25, patient had an episode of slurred speech, right facial droop, and confusion that resolved within 30 minutes of onset.  He was diagnosed with a TIA but no MRI brain imaging was completed at that time.  Patient has had delayed processing and decreased verbal output (wife notes the patient is abnormally quiet) since this event with acute progression/worsening of delayed processing leading to seeking further evaluation on current presentation.   OBJECTIVE CBC    Component Value Date/Time   WBC 6.5 06/16/2023 0611   RBC 4.51 06/16/2023 0611   HGB 14.2 06/16/2023 0611   HGB 15.8 08/08/2022 1135   HCT 43.0 06/16/2023 0611   HCT 46.1 08/08/2022 1135   PLT 208 06/16/2023 0611   PLT 227 08/08/2022 1135   MCV 95.3  06/16/2023 0611   MCV 92 08/08/2022 1135   MCH 31.5 06/16/2023 0611   MCHC 33.0 06/16/2023 0611   RDW 13.7 06/16/2023 0611   RDW 15.0 08/08/2022 1135   LYMPHSABS 2.0 06/17/2019 0955   MONOABS 0.7 02/14/2014 0808   EOSABS 0.1 06/17/2019 0955   BASOSABS 0.1 06/17/2019 0955   BMET    Component Value Date/Time   NA 140 06/16/2023 0611   NA 143 08/08/2022 1135   K 3.5 06/16/2023 0611   CL 102 06/16/2023 0611   CO2 32 06/16/2023 0611   GLUCOSE 103 (H) 06/16/2023 0611   BUN 6 06/16/2023 0611   BUN 20 03/31/2023 0000   CREATININE 1.00 06/16/2023 0611   CALCIUM 7.8 (L) 06/16/2023 0611   EGFR 99.3 03/31/2023 0000   EGFR 79 08/08/2022 1135   GFRNONAA >60 06/16/2023 0611   Lab Results  Component Value Date   HGBA1C 5.8 03/31/2023   Lab Results  Component Value Date   CHOL 256 (H) 06/16/2023   HDL 42 06/16/2023   LDLCALC 179 (H) 06/16/2023   TRIG 174 (H) 06/16/2023   CHOLHDL 6.1 06/16/2023   IMAGING past 24 hours CT ANGIO HEAD NECK W WO CM Result Date: 06/16/2023 CLINICAL DATA:  Acute infarcts on same-day MRI, altered mental status EXAM: CT ANGIOGRAPHY HEAD AND NECK WITH AND WITHOUT CONTRAST TECHNIQUE: Multidetector CT imaging of the head and neck was performed using the standard protocol during bolus administration of intravenous contrast. Multiplanar CT image reconstructions and MIPs were obtained to evaluate the vascular anatomy. Carotid stenosis measurements (when applicable) are obtained utilizing NASCET criteria, using the distal internal carotid  diameter as the denominator. RADIATION DOSE REDUCTION: This exam was performed according to the departmental dose-optimization program which includes automated exposure control, adjustment of the mA and/or kV according to patient size and/or use of iterative reconstruction technique. CONTRAST:  75mL OMNIPAQUE IOHEXOL 350 MG/ML SOLN COMPARISON:  05/01/2023 CT head, no prior CTA available, correlation is also made with 06/15/2023 MRI head  FINDINGS: CT HEAD FINDINGS Brain: No evidence of acute infarct, hemorrhage, mass, mass effect, or midline shift. No hydrocephalus or extra-axial fluid collection. The suspected acute infarcts on the same day MRI head are not appreciated on the CT. Subacute lacunar infarct in the left thalamus, which is new from the prior head CT. Vascular: No hyperdense vessel. Skull: Negative for fracture or focal lesion. Sinuses/Orbits: Mucous retention cysts in the left maxillary sinus. No acute finding in the orbits. Other: The mastoid air cells are well aerated. CTA NECK FINDINGS Aortic arch: Standard branching. Imaged portion shows no evidence of aneurysm or dissection. No significant stenosis of the major arch vessel origins. Right carotid system: Limited visualization of the proximal right common carotid artery due to beam hardening artifact from the adjacent contrast bolus. Within this limitation, no evidence of dissection, occlusion, or hemodynamically significant stenosis (greater than 50%). Left carotid system: No evidence of dissection, occlusion, or hemodynamically significant stenosis (greater than 50%). Vertebral arteries: No evidence of dissection, occlusion, or hemodynamically significant stenosis (greater than 50%). Skeleton: No acute osseous abnormality. Degenerative changes in the cervical spine. Prior median sternotomy. Other neck: No acute finding. Upper chest: No focal pulmonary opacity or pleural effusion. Review of the MIP images confirms the above findings CTA HEAD FINDINGS Anterior circulation: Both internal carotid arteries are patent to the termini, without significant stenosis. Patent left A1. Aplastic right A1. Normal anterior communicating artery. The right A2 is dominant, with a diminutive left ACA. Anterior cerebral arteries are patent to their distal aspects without significant stenosis. No M1 stenosis or occlusion. MCA branches perfused to their distal aspects without significant stenosis.  Posterior circulation: Vertebral arteries patent to the vertebrobasilar junction without significant stenosis. Posterior inferior cerebellar arteries patent proximally. Basilar patent to its distal aspect without significant stenosis. Superior cerebellar arteries patent proximally. Patent P1 segments. PCAs perfused to their distal aspects without significant stenosis. The bilateral posterior communicating arteries are diminutive but patent. Venous sinuses: As permitted by contrast timing, patent. Anatomic variants: None significant. No evidence of aneurysm or vascular malformation. Review of the MIP images confirms the above findings IMPRESSION: 1. No intracranial large vessel occlusion or significant stenosis. 2. No hemodynamically significant stenosis in the neck. 3. Subacute lacunar infarct in the left thalamus, which is new from the prior head CT. The suspected acute infarcts on the same day MRI head are not appreciated on the CT. Electronically Signed   By: Wiliam Ke M.D.   On: 06/16/2023 01:00   DG Chest 2 View Result Date: 06/15/2023 CLINICAL DATA:  [id number] SOB (shortness of breath) [id number] EXAM: CHEST - 2 VIEW COMPARISON:  Chest x-ray 05/22/2023 FINDINGS: The heart and mediastinal contours are unchanged. Low lung volumes. No focal consolidation. No pulmonary edema. No pleural effusion. No pneumothorax. No acute osseous abnormality.  Sternotomy wires are intact. IMPRESSION: Low lung volumes with no active cardiopulmonary disease. Electronically Signed   By: Tish Frederickson M.D.   On: 06/15/2023 20:58   MR BRAIN WO CONTRAST Result Date: 06/15/2023 CLINICAL DATA:  Provided history: Mental status change, unknown cause. EXAM: MRI HEAD WITHOUT CONTRAST TECHNIQUE: Multiplanar, multiecho pulse  sequences of the brain and surrounding structures were obtained without intravenous contrast. COMPARISON:  Head CT 05/01/2023. FINDINGS: The patient was unable to tolerate the full examination. Only axial and coronal  diffusion-weighted sequences could be acquired. 6 mm focus of diffusion-weighted signal abnormality within the posterior left frontal lobe white matter (series 2, image 37). 4 mm focus of diffusion-weighted signal abnormality within the right parietal lobe white matter (series 2, image 36). Subacute infarct within the left thalamus, new from the prior head CT of 05/01/2023. IMPRESSION: 1. Prematurely terminated examination consisting of diffusion-weighted sequences only. 2. Two subcentimeter foci of white matter diffusion-weighted signal abnormality, one within the posterior left frontal lobe and the other within the right parietal lobe. These are compatible with acute/subacute infarcts. 3. Subacute infarct within the left thalamus, new from the prior head CT of 05/01/2023. Electronically Signed   By: Jackey Loge D.O.   On: 06/15/2023 16:39   Vitals:   06/16/23 0630 06/16/23 0645 06/16/23 0700 06/16/23 0747  BP: 109/73 99/67 104/74   Pulse: 60 (!) 57 (!) 59   Resp: 10 14 13    Temp:      TempSrc:      SpO2: 93% 94% 94% 96%   PHYSICAL EXAM General:  Alert, well-nourished, well-developed Caucasian male patient in no acute distress Psych:  Mood and affect appropriate for situation. Affect is somewhat flat throughout. Patient is cooperative with exam.  CV: Regular rate and rhythm on monitor, midline vertical sternal scar noted, well approximated  Respiratory:  Regular, unlabored respirations on room air GI: Abdomen soft and nontender  NEURO:  Mental Status: AA&Ox3, patient is able to give clear and coherent history with short but fluent sentences.  Speech/Language: speech is without dysarthria or aphasia.  Naming, repetition, fluency, and comprehension intact. Memory recall is intact.  Patient states that he feels "brain fog" with delayed processing.   Cranial Nerves:  II: PERRL. Visual fields full.  III, IV, VI: EOMI. Eyelids elevate symmetrically.  V: Sensation is intact to light touch and  symmetrical to face.  VII: Face is symmetrical resting and and with movement VIII: Hearing intact to voice. IX, X: Palate elevates symmetrically. Phonation is normal.  XI: Shoulder shrug 5/5. XII: Tongue is midline without fasciculations. Motor: 5/5 strength to all muscle groups tested.  Tone: is normal and bulk is normal Sensation: Intact to light touch bilaterally. Extinction absent to light touch to DSS.   Coordination: FTN intact bilaterally, HKS: no ataxia in BLE.No drift.  Gait: Stable without use of assistive device as patient is walking around the room independently on examiner arrival.   ASSESSMENT/PLAN  Stroke: Acute/subacute punctate/small posterior left frontal lobe, right parietal lobe infarct  Etiology:  likely small vessel disease vs perioperative from recent CABG.  CT Head: possible chronic lacunar infarct in the left thalamus, new from prior head CT.  CTA head & neck no LVO or significant stenosis in the head or neck.  MRI brain limited: Two subcentimeter foci of white matter diffusion-weighted signal abnormality, one within the posterior left frontal lobe and the other within the right parietal lobe. These are compatible with acute/subacute infarcts.  MRI brain full image - pending 2D Echo LVEF 45-50% LDL 179 HgbA1c 5.8 in 03/2023 VTE prophylaxis - SCDs Cardiology has planned 30 day event monitor as outpt to evaluate for possible AF  aspirin 81 mg daily prior to admission, now on aspirin 81 mg daily and clopidogrel 75 mg daily for 3 weeks and then clopidogrel alone.  Therapy recommendations:  None Disposition:  pending  History of TIA Patient's wife reports a TIA during admission at [facility] for CABG procedure at OSH 05/12/23 with transient right facial weakness, RUE weakness, slurred speech, symptoms resolved after CT head exam. No recurrence.  However, pt wife pt had intermittent confusion with delayed processing since that event no MRI was obtained at OSH due to  equipment and patient intolerance/extreme anxiety Documentation supports [facility] 11/22 and 11/23 without acute findings Patient compliant with ASA at home  Hypertension CAD s/p CABG in November at [facility] Obstructive hypertrophic cardiomyopathy s/p myectomy Non-AL non-ATTR cardiac amyloidosis Acute on chronic HFrEF Home meds:  furosemide, Toprol-XL Stable Home meds resumed BUN/Cr stable Long term BP goal - normotensive  Brief postoperative AF from CABG in November 2024 Postoperative AF lasting only one hour per wife [place] [facility] elected not to start [facility] as AF was felt to be brief and post operative On amiodarone (completed 28 days) and aspirin outpatient Denies recurrence    Hyperlipidemia Home meds:  pravastatin 80 mg Zetia discontinued at discharge from OSH in November 2024 LDL 179, goal < 70 Transition pravastatin to rosuvastatin 40 mg PO daily and add Zetia 10 mg daily  Continue statin at discharge  Other Stroke Risk Factors Obesity, 37.30 kg/m2 BMI >/= 30 associated with increased stroke risk, recommend weight loss, diet and exercise as appropriate  Obstructive sleep apnea, on CPAP at home  Other Active Problems OSA with hypoxia Reported oxygen saturations in the 80s at rest at home Hypoxic on CPAP overnight, improved with addition of oxygen PCCM on board Chronic lower back pain  BPH On home flomax Hypothyroidism On home Synthroid Panic attacks On home [facility] day # 1  Lanae Boast, AGACNP-BC [facility] Pager: [phone]  ATTENDING NOTE: I reviewed above note and agree with the assessment and plan. Pt was seen and examined.   Wife at bedside.  Patient lying in bed, neurologically intact without focal deficit.  AOx3, normal attention and concentration, no apraxia.  Moving all extremities, sensation symmetrical and no ataxia.  Family reported intermittent confusion, word finding difficulty and memory issue at home since CABG procedure in  04/2023 at [facility] clinic.  Limited brain MRI showed acute/subacute left frontal and right parietal punctate infarcts, chronic left thalamic infarct.  Although multifocal, however likely in separate timing and location still more consistent with small vessel disease.  Patient does have multiple risk factors for small vessel disease.  Recommend full brain MRI given the chronic left thalamic infarct likely to be small hemorrhagic infarct.  Continue DAPT for 3 weeks and then Plavix alone.  LDL and TG is high, converted pravastatin from 80 to Crestor 40 and Zetia 10.  PT and OT no recommendation.  Will follow  For detailed assessment and plan, please refer to above/below as I have made changes wherever appropriate.   Marvel Plan, MD PhD Stroke Neurology 06/16/2023 5:47 PM    To contact Stroke Continuity provider, please refer to [email or web address]. After hours, contact General Neurology

## 2023-06-16 NOTE — Progress Notes (Signed)
Echocardiogram 2D Echocardiogram has been performed.  Joseph [name] 06/16/2023, 11:08 AM

## 2023-06-16 NOTE — TOC Initial Note (Signed)
Transition of Care (TOC) - Initial/Assessment Note    Patient Details  Name: Joseph [name]. MRN: [id number] Date of Birth: July 26, 1965  Transition of Care [facility]) CM/SW Contact:    Kermit Balo, RN Phone Number: 06/16/2023, 2:28 PM  Clinical Narrative:                  Pt is from home with his spouse. She is with him all the time.  Wife inquiring about ambulatory and sedentary sats to see if pt may need home oxygen. He does have a CPAP and has an appointment next week to get it adjusted post cardiac surgery.  Wife can provide needed transportation.  Wife manages his medications at home. TOC following.  Expected Discharge Plan: Home/Self Care Barriers to Discharge: Continued Medical Work up   Patient Goals and CMS Choice            Expected Discharge Plan and Services   Discharge Planning Services: CM Consult   Living arrangements for the past 2 months: Single Family Home                                      Prior Living Arrangements/Services Living arrangements for the past 2 months: Single Family Home Lives with:: Spouse Patient language and need for interpreter reviewed:: Yes Do you feel safe going back to the place where you live?: Yes        Care giver support system in place?: Yes (comment) Current home services: DME (BSC/ transfer chair/ electric scooter/ cane/ CPAP/ walker) Criminal Activity/Legal Involvement Pertinent to Current Situation/Hospitalization: No - Comment as needed  Activities of Daily Living   ADL Screening (condition at time of admission) Independently performs ADLs?: Yes (appropriate for developmental age) Is the patient deaf or have difficulty hearing?: No Does the patient have difficulty seeing, even when wearing glasses/contacts?: No Does the patient have difficulty concentrating, remembering, or making decisions?: Yes  Permission Sought/Granted                  Emotional Assessment Appearance:: Appears stated  age   Affect (typically observed): Flat, Quiet Orientation: : Oriented to Self, Oriented to Place, Oriented to  Time, Oriented to Situation   Psych Involvement: No (comment)  Admission diagnosis:  Ischemic stroke (HCC) [I63.9] Cerebrovascular accident (CVA), unspecified mechanism (HCC) [I63.9] Patient Active Problem List   Diagnosis Date Noted   Subacute ischemic stroke (HCC) 06/15/2023   Memory impairment 06/15/2023   Hypothyroidism 06/15/2023   History of CAD (coronary artery disease) 06/15/2023   History of TIA (transient ischemic attack) 06/15/2023   Hypokalemia 06/15/2023   Paroxysmal atrial fibrillation (HCC) 06/15/2023   Hypoxic respiratory failure since cardiac surgery 04/2023 (HCC) 06/15/2023   Panic disorder 06/15/2023   BPH (benign prostatic hyperplasia) 06/15/2023   History of hip surgery 06/15/2023   Pericardial effusion 06/13/2023   Left ventricular hypertrophy 05/30/2023   Abnormal electrocardiogram (ECG) (EKG) 05/30/2023   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 (HCC) 04/14/2023   Constipation 01/19/2023   Postprandial abdominal bloating 01/19/2023   LLQ pain 01/19/2023   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   Palpitations 09/20/2022   Allergy to alpha-gal 12/16/2019   Angio-edema 06/17/2019   Allergic reaction 06/17/2019   Penicillin allergy 06/17/2019   Avascular necrosis of bone of left hip (HCC) 04/08/2015  Avascular necrosis of hip (HCC) 04/08/2015   Postoperative anemia due to acute blood loss 02/20/2014   Closed left hip fracture (HCC) 02/07/2014   OSA (obstructive sleep apnea) 02/07/2014   Essential hypertension 11/29/2010   Hyperlipidemia 09/08/2009   Coronary artery disease involving native coronary artery of native heart with angina pectoris (HCC) 09/08/2009   PCP:  Richardean Chimera, MD Pharmacy:   [facility] - [place], [place] - [address] [place] [address] Phone: [phone] Fax: [phone]  [facility] [address] [place] [address] Phone: [phone] Fax: [phone]     Social Drivers of Health (SDOH) Social History: SDOH Screenings   Food Insecurity: No Food Insecurity (05/12/2023)   Received from [facility]  Housing: Low Risk  (06/16/2023)  Transportation Needs: No Transportation Needs (06/16/2023)  Utilities: Not At Risk (06/16/2023)  Financial Resource Strain: Low Risk  (12/31/2020)   Received from [facility]  Physical Activity: Inactive (12/31/2020)   Received from [facility], [facility]  Social Connections: Unknown (11/01/2021)   Received from [facility], [facility]  Stress: No Stress Concern Present (12/31/2020)   Received from [facility], [facility]  Tobacco Use: Medium Risk (06/15/2023)   SDOH Interventions:     Readmission Risk Interventions     No data to display

## 2023-06-16 NOTE — Telephone Encounter (Signed)
06/16/2023 Next available sleep f/u for CPAP issues post-cardiac surgery.  Message sent to Dr. Vassie Loll as well.  Myrla Halsted MD PCCM

## 2023-06-16 NOTE — Hospital Course (Signed)
57 y.o. male with medical history significant of hypothyroidism, CAD PCI 2021, hypertrophic cardiomyopathy underwent open heart surgery and myectomy at [facility] clinic in November 2024, complicated by acute hypoxic respiratory failure atrial fibrillation, TIA, postoperative atrial fibrillation currently on amiodarone, cardiac amyloidosis, hypoxic respiratory failure postsurgery currently on home oxygen, panic disorder, essential hypertension, hyperlipidemia and BPH. Patient was traveling and was admitted to a hospital in November.  Patient underwent CABG as well as a myomectomy while in the hospital family states he had a possible TIA.  Since he left the hospital that month.  Patient has been having episodes of memory lapses he will seem intermittently confused.  He has been having more trouble with feeling anxious at times and having trouble with his memory.  No seizure no loss of consciousness.  Family was told these could be signs of a possible stroke so he came to the ED for evaluation.  During my evaluation at the bedside patient is alert oriented x 3.  He has a flat affect.  Patient does not have any focal and weakness.  Patient denies any headache, dizziness, tremor, tingling, sensory change, speech change, focal weakness, seizure and loss of consciousness.  No short-term memory issues.  Patient stated sleep disturbance. Patient has chronic bilateral lower extremity edema more on the right side which is chronic in nature since patient has not right-sided hip arthroplasty and dependent lymphedema. Patient is a CPAP machine at home. Since the surgery patient is on home oxygen 2 to 3 L and he continues to desat 88% with ambulation without oxygen. Denies any chest pain and palpitation.

## 2023-06-16 NOTE — Progress Notes (Signed)
06/16/2023 Met with patient, wife, discussed their concerns RE: efficacy of CPAP; will try to push up their sleep f/u.  Agree that getting toward euvolemia may fix many issues.  Myrla Halsted MD PCCM

## 2023-06-16 NOTE — Progress Notes (Signed)
Progress Note   Patient: Joseph [name]. WUJ:[id number] DOB: 1965/08/17 DOA: 06/15/2023     1 DOS: the patient was seen and examined on 06/16/2023   Brief hospital course: 57 y.o. male with medical history significant of hypothyroidism, CAD PCI 2021, hypertrophic cardiomyopathy underwent open heart surgery and myectomy at [facility] clinic in November 2024, complicated by acute hypoxic respiratory failure atrial fibrillation, TIA, postoperative atrial fibrillation currently on amiodarone, cardiac amyloidosis, hypoxic respiratory failure postsurgery currently on home oxygen, panic disorder, essential hypertension, hyperlipidemia and BPH. Patient was traveling and was admitted to a hospital in November.  Patient underwent CABG as well as a myomectomy while in the hospital family states he had a possible TIA.  Since he left the hospital that month.  Patient has been having episodes of memory lapses he will seem intermittently confused.  He has been having more trouble with feeling anxious at times and having trouble with his memory.  No seizure no loss of consciousness.  Family was told these could be signs of a possible stroke so he came to the ED for evaluation.  During my evaluation at the bedside patient is alert oriented x 3.  He has a flat affect.  Patient does not have any focal and weakness.  Patient denies any headache, dizziness, tremor, tingling, sensory change, speech change, focal weakness, seizure and loss of consciousness.  No short-term memory issues.  Patient stated sleep disturbance. Patient has chronic bilateral lower extremity edema more on the right side which is chronic in nature since patient has not right-sided hip arthroplasty and dependent lymphedema. Patient is a CPAP machine at home. Since the surgery patient is on home oxygen 2 to 3 L and he continues to desat 88% with ambulation without oxygen. Denies any chest pain and palpitation.  Assessment and Plan: Subacute ischemic  stroke Memory impairment History of TIA > Patient presenting with intermittent confusion and memory issues since November 2024.  Patient sustained a TIA in November while hospitalized for cardiac myectomy.  Currently on aspirin 81 mg daily at home. - MRI brain showed subacute infarction within the left thalamus new from prior head CT 11/11/22024.  Two subcentimeter foci of white matter diffusion-weighted signal abnormality, one within the posterior left frontal lobe and the other within the right parietal lobe. These are compatible with acute/subacute infarcts. -Neurology consulted. Recs for ASA 81mg  daily  -LDL and TG elevated, thus now on Crestor 40mg  and Zetia 10mg  -CTA head/neck with no significant stenosis. Subacute lacunar infarct in L thalamus and suspected new infarcts on above MRI -2d echo reviewed. Severe LV hypertrophy with normal LVEF. No intra-atrial shunts -If CVA is embolic, then can consider DOAC per Cardiology   Hypertrophic cardiomyopathy status post myectomy November 2024 Cardiac amyloidosis History of CAD s/p RCA bypass -Patient recently underwent cardiac myectomy secondary to hypertrophic cardiomyopathy and cardiac amyloidosis and endocardial fibrosis at [facility], [place]. -Echo from 06/12/2023 showed reduced EF 45 to 50%, left ventricular wall motion abnormality consistent left bundle branch block and left ventricular hypertrophy. -repeat 2d echo with EF 55-60% -Pt continued on metoprolol XL 25 mg daily -Per cardiology outpt plan for PET and antibiody   Postoperative atrial fibrillation - Currently on amiodarone.  Not on anticoagulation at home currently.  - Currently rate controlled - Continue amiodarone 200 mg daily and Toprol-XL 25 mg daily. -Continue cardiac monitoring   Essential hypertension Systolic heart failure reduced EF 45 to 50% -Echo from 06/12/2023 showed reduced EF 45 to 50%, left  ventricular wall motion abnormality consistent left bundle branch  block and left ventricular hypertrophy. -Blood pressure is well-controlled.  Continue Toprol-XL  -continued on lasix 40mg  IV bid for now, continue per Cardiology   History of CAD status post RCA bypass - Continue aspirin, crestor, zetia and Toprol-XL.   Hypothyroidism -Continue Synthyroid 200 mcg daily   Panic disorder Patient has history of panic disorder and frequent panic attack. -was started on Xanax 0.5 mg every 6 hour as needed for anxiety.   Chronic hypoxic respiratory failure since cardiac surgery November 2024 - Continue to check pulse ox and supplemental oxygen 2 L to keep O2 sat above 92%. -Will continue CPAP at bedtime. -will need O2 saturation trials closer to d/c   BPH -Continue Flomax.   History of right-sided hip arthroplasty and back surgery Right-sided lower extremity lymphadenopathy secondary to hip surgery -Continue Lasix 40 IV BID per Cardiology    History of OSA on CPAP -Continue CPAP at bedtime   Subjective: Feeling tired this AM  Physical Exam: Vitals:   06/16/23 0830 06/16/23 1130 06/16/23 1219 06/16/23 1624  BP: 132/89 102/74 114/80 (!) 98/59  Pulse: 62 62 (!) 57 63  Resp: 12 12 14 18   Temp: (!) 97.5 F (36.4 C) 97.8 F (36.6 C) 98.1 F (36.7 C) 97.8 F (36.6 C)  TempSrc:  Oral Oral Oral  SpO2: 98% 95% 97% 95%   General exam: Awake, laying in bed, in nad Respiratory system: Normal respiratory effort, no wheezing Cardiovascular system: regular rate, s1, s2 Gastrointestinal system: Soft, nondistended, positive BS Central nervous system: CN2-12 grossly intact, strength intact Extremities: Perfused, no clubbing Skin: Normal skin turgor, no notable skin lesions seen Psychiatry: Mood normal // no visual hallucinations   Data Reviewed:  Labs reviewed: Na 140, K 3.5, Cr 1.00, WBC 6.5, hgb 14.2, Plts 208  Family Communication: Pt in room, family at bedside  Disposition: Status is: Inpatient Remains inpatient appropriate because: severity  of illness  Planned Discharge Destination: Home    Author: Rickey Barbara, MD 06/16/2023 5:47 PM  For on call review [email or web address].

## 2023-06-16 NOTE — Progress Notes (Addendum)
Rounding Note    Patient Name: Joseph [name]. Date of Encounter: 06/16/2023  [facility] Cardiologist: Tonny Bollman, MD   Subjective   Was SOB overnight, O2 sats dropping on CPAP >> O2 added and now ok.  Had gained some fluid at [facility], has been working to lose it  Dry wt 270 on 11/11, 274 on 12/24  Inpatient Medications    Scheduled Meds:   stroke: early stages of recovery book   Does not apply Once   aspirin EC  81 mg Oral Daily   busPIRone  15 mg Oral BID   furosemide  40 mg Intravenous BID   levothyroxine  200 mcg Oral Q0600   metoprolol succinate  25 mg Oral Daily   pravastatin  80 mg Oral Daily   sodium chloride flush  3 mL Intravenous Q12H   tamsulosin  0.4 mg Oral QHS   Continuous Infusions:  sodium chloride     PRN Meds: sodium chloride, acetaminophen **OR** acetaminophen, bismuth subsalicylate, hydrALAZINE, LORazepam, nitroGLYCERIN, ondansetron **OR** ondansetron (ZOFRAN) IV, oxyCODONE, senna-docusate, sodium chloride flush   Vital Signs    Vitals:   06/16/23 0615 06/16/23 0630 06/16/23 0645 06/16/23 0700  BP: 100/72 109/73 99/67 104/74  Pulse: 63 60 (!) 57 (!) 59  Resp: 14 10 14 13   Temp:      TempSrc:      SpO2: 92% 93% 94% 94%   No intake or output data in the 24 hours ending 06/16/23 0744    06/13/2023    8:36 AM 06/12/2023    3:21 PM 06/09/2023    1:02 PM  Last 3 Weights  Weight (lbs) 275 lb 280 lb 3.2 oz 278 lb  Weight (kg) 124.739 kg 127.098 kg 126.1 kg      Telemetry    SR, LBBB is 176 ms, unchanged - Personally Reviewed  ECG    None today - Personally Reviewed  Physical Exam   GEN: No acute distress.   Neck: No JVD seen, difficult to assess 2nd body habitus Cardiac: RRR, 2/6 murmur, no rubs, or gallops.  Respiratory: rales bases bilaterally. GI: Soft, nontender, non-distended  MS: 1+ LE edema; No deformity. Neuro:  Nonfocal  Psych: Normal affect   Labs    High Sensitivity Troponin:   Recent  Labs  Lab 05/22/23 1743  TROPONINIHS 206*     Chemistry Recent Labs  Lab 06/15/23 1235 06/16/23 0611  NA 139 140  K 3.9 3.5  CL 101 102  CO2 29 32  GLUCOSE 109* 103*  BUN 8 6  CREATININE 0.79 1.00  CALCIUM 8.5* 7.8*  PROT 5.8* 4.3*  ALBUMIN 2.2* 1.6*  AST 50* 36  ALT 37 29  ALKPHOS 107 82  BILITOT 0.5 0.6  GFRNONAA >60 >60  ANIONGAP 9 6    Lipids  Recent Labs  Lab 06/16/23 0611  CHOL 256*  TRIG 174*  HDL 42  LDLCALC 179*  CHOLHDL 6.1    Hematology Recent Labs  Lab 06/15/23 1235 06/16/23 0611  WBC 9.0 6.5  RBC 5.12 4.51  HGB 16.4 14.2  HCT 48.3 43.0  MCV 94.3 95.3  MCH 32.0 31.5  MCHC 34.0 33.0  RDW 13.5 13.7  PLT 267 208   Thyroid  Recent Labs  Lab 06/09/23 1134  TSH 10.200*  FREET4 1.44    BNP Recent Labs  Lab 06/15/23 1235  BNP 316.7*    DDimer No results for input(s): "DDIMER" in the last 168 hours.  Radiology    CT ANGIO HEAD NECK W WO CM Result Date: 06/16/2023 CLINICAL DATA:  Acute infarcts on same-day MRI, altered mental status EXAM: CT ANGIOGRAPHY HEAD AND NECK WITH AND WITHOUT CONTRAST TECHNIQUE: Multidetector CT imaging of the head and neck was performed using the standard protocol during bolus administration of intravenous contrast. Multiplanar CT image reconstructions and MIPs were obtained to evaluate the vascular anatomy. Carotid stenosis measurements (when applicable) are obtained utilizing NASCET criteria, using the distal internal carotid diameter as the denominator. RADIATION DOSE REDUCTION: This exam was performed according to the departmental dose-optimization program which includes automated exposure control, adjustment of the mA and/or kV according to patient size and/or use of iterative reconstruction technique. CONTRAST:  75mL OMNIPAQUE IOHEXOL 350 MG/ML SOLN COMPARISON:  05/01/2023 CT head, no prior CTA available, correlation is also made with 06/15/2023 MRI head FINDINGS: CT HEAD FINDINGS Brain: No evidence of acute  infarct, hemorrhage, mass, mass effect, or midline shift. No hydrocephalus or extra-axial fluid collection. The suspected acute infarcts on the same day MRI head are not appreciated on the CT. Subacute lacunar infarct in the left thalamus, which is new from the prior head CT. Vascular: No hyperdense vessel. Skull: Negative for fracture or focal lesion. Sinuses/Orbits: Mucous retention cysts in the left maxillary sinus. No acute finding in the orbits. Other: The mastoid air cells are well aerated. CTA NECK FINDINGS Aortic arch: Standard branching. Imaged portion shows no evidence of aneurysm or dissection. No significant stenosis of the major arch vessel origins. Right carotid system: Limited visualization of the proximal right common carotid artery due to beam hardening artifact from the adjacent contrast bolus. Within this limitation, no evidence of dissection, occlusion, or hemodynamically significant stenosis (greater than 50%). Left carotid system: No evidence of dissection, occlusion, or hemodynamically significant stenosis (greater than 50%). Vertebral arteries: No evidence of dissection, occlusion, or hemodynamically significant stenosis (greater than 50%). Skeleton: No acute osseous abnormality. Degenerative changes in the cervical spine. Prior median sternotomy. Other neck: No acute finding. Upper chest: No focal pulmonary opacity or pleural effusion. Review of the MIP images confirms the above findings CTA HEAD FINDINGS Anterior circulation: Both internal carotid arteries are patent to the termini, without significant stenosis. Patent left A1. Aplastic right A1. Normal anterior communicating artery. The right A2 is dominant, with a diminutive left ACA. Anterior cerebral arteries are patent to their distal aspects without significant stenosis. No M1 stenosis or occlusion. MCA branches perfused to their distal aspects without significant stenosis. Posterior circulation: Vertebral arteries patent to the  vertebrobasilar junction without significant stenosis. Posterior inferior cerebellar arteries patent proximally. Basilar patent to its distal aspect without significant stenosis. Superior cerebellar arteries patent proximally. Patent P1 segments. PCAs perfused to their distal aspects without significant stenosis. The bilateral posterior communicating arteries are diminutive but patent. Venous sinuses: As permitted by contrast timing, patent. Anatomic variants: None significant. No evidence of aneurysm or vascular malformation. Review of the MIP images confirms the above findings IMPRESSION: 1. No intracranial large vessel occlusion or significant stenosis. 2. No hemodynamically significant stenosis in the neck. 3. Subacute lacunar infarct in the left thalamus, which is new from the prior head CT. The suspected acute infarcts on the same day MRI head are not appreciated on the CT. Electronically Signed   By: Wiliam Ke M.D.   On: 06/16/2023 01:00   DG Chest 2 View Result Date: 06/15/2023 CLINICAL DATA:  [id number] SOB (shortness of breath) [id number] EXAM: CHEST - 2 VIEW COMPARISON:  Chest  x-ray 05/22/2023 FINDINGS: The heart and mediastinal contours are unchanged. Low lung volumes. No focal consolidation. No pulmonary edema. No pleural effusion. No pneumothorax. No acute osseous abnormality.  Sternotomy wires are intact. IMPRESSION: Low lung volumes with no active cardiopulmonary disease. Electronically Signed   By: Tish Frederickson M.D.   On: 06/15/2023 20:58   MR BRAIN WO CONTRAST Result Date: 06/15/2023 CLINICAL DATA:  Provided history: Mental status change, unknown cause. EXAM: MRI HEAD WITHOUT CONTRAST TECHNIQUE: Multiplanar, multiecho pulse sequences of the brain and surrounding structures were obtained without intravenous contrast. COMPARISON:  Head CT 05/01/2023. FINDINGS: The patient was unable to tolerate the full examination. Only axial and coronal diffusion-weighted sequences could be acquired. 6 mm  focus of diffusion-weighted signal abnormality within the posterior left frontal lobe white matter (series 2, image 37). 4 mm focus of diffusion-weighted signal abnormality within the right parietal lobe white matter (series 2, image 36). Subacute infarct within the left thalamus, new from the prior head CT of 05/01/2023. IMPRESSION: 1. Prematurely terminated examination consisting of diffusion-weighted sequences only. 2. Two subcentimeter foci of white matter diffusion-weighted signal abnormality, one within the posterior left frontal lobe and the other within the right parietal lobe. These are compatible with acute/subacute infarcts. 3. Subacute infarct within the left thalamus, new from the prior head CT of 05/01/2023. Electronically Signed   By: Jackey Loge D.O.   On: 06/15/2023 16:39    Cardiac Studies   ECHO: ordered  LIMITED ECHO: 06/12/2023  1. Left ventricular ejection fraction, by estimation, is 45 to 50%. The left ventricle has mildly decreased function. The left ventricle demonstrates regional wall motion abnormalities. Abnormal (paradoxical) septal motion, consistent with left bundle branch block. There is moderate left ventricular hypertrophy.   2. Right ventricular systolic function is normal. The right ventricular size is normal.   3. Moderate pericardial effusion. There is no evidence of cardiac tamponade.   4. The mitral valve is normal in structure. Trivial mitral valve  regurgitation.   5. The aortic valve is tricuspid. Aortic valve regurgitation is trivial. No aortic stenosis is present.   Patient Profile     57 y.o. male with a complex cardiac history including coronary artery disease status post PPCI and RCA CABG, obstructive hypertrophic cardiomyopathy status post myectomy 04/2023, and non-AL, non-ATTR cardiac amyloidosis,OSA, who presented 12/26 with altered mental status and personality changes that began on Christmas day.   Dx w/ mult subacute strokes, no volume  overload.   Assessment & Plan    Multiple subacute strokes - Neuro seeing - if felt embolic, consider anticoag, currently on ASA 81 mg only - monitor at d/c  2. Acute on chronic HFrEF - On Lasix 40 mg IV bid for now, continue this - per pt, dry wt 270, was 4 lbs up on 12/24 - BUN/Cr stable  3. Hx CAD - no ischemic sx - trop up some, but likely from the CVA - On ASA, toprol XL 25 mg and Pravachol 80 mg every day - add SGLT2 at d/c   4. Cardiac Amyloidosis - Non-AL, non-ATTR cardiac amyloidosis confirmed by biopsy, contributing to reduced ejection fraction and overall cardiac function. - outpatient plan for PET and antibody clinical trial consideration   5. OSA, hypoxia - according to wife, O2 sats at home are in the 80s at rest - suspect component of OHS - when he takes deep breaths or walks around >> sats > 90% - was hypoxic on CPAP (they don't routinely check this at  home) - Sats are better w/ O2 added to CPAP   For questions or updates, please contact [facility] Please consult [email or web address] for contact info under      Signed, Theodore Demark, PA-C  06/16/2023, 7:44 AM    History and all data above reviewed.  Patient examined.  I agree with the findings as above.   Quiet this morning.  No acute complaints.  The patient exam reveals COR:RRR  ,  Lungs: Clear  ,  Abd: Positive bowel sounds, no rebound no guarding, Ext Moderate right greater than left leg edema  .  All available labs, radiology testing, previous records reviewed. Agree with documented assessment and plan.   Acute on chronic HFmrEF:  Continue IV diuresis.  Other med changes and therapies are being considered by Dr. Izora Ribas as an outpatient.    Will order knee high compression socks.  OSA:  His wife is requesting [place] [facility] to look at his CPAP settings.  O2 has been added.  CVAs:  Not thought at this point to be related to [facility].  Amio was to stop soon per [facility] and this is fine.   We can then monitor his rhythm as an out patient with a 4 week monitor.    Joseph [name]  8:45 AM  06/16/2023
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
## 2023-06-16 NOTE — Evaluation (Signed)
Occupational Therapy Evaluation Patient Details Name: Joseph [name]. MRN: [id number] DOB: 09/20/1965 Today's Date: 06/16/2023   History of Present Illness 57 year old with a complex cardiac history including coronary artery disease status post PPCI and RCA CABG, obstructive hypertrophic cardiomyopathy status post myectomy, and non-AL, non-ATTR cardiac amyloidosis, presents with altered mental status and personality changes. These changes were first noted around Christmas, prompting an MRI and emergency room visit 12/26. In the ER, multiple subacute strokes were identified.   Clinical Impression   Patient admitted for the diagnosis above.  PTA he lives at home with his spouse, occasional use of RW, depending on how he feels that day.  Generally does not use a RW, Ind with ADL and IADL.  Patient is very close to his baseline, very flat, but has been through a lot in the past few months.  No acute OT needs identified despite CVA on MRI.  Patient will be starting outpatient cardiac rehab in the next few weeks.          If plan is discharge home, recommend the following: Assist for transportation    Functional Status Assessment  Patient has not had a recent decline in their functional status  Equipment Recommendations  None recommended by OT    Recommendations for Other Services       Precautions / Restrictions Precautions Precautions: None Restrictions Weight Bearing Restrictions Per Provider Order: No      Mobility Bed Mobility Overal bed mobility: Independent                  Transfers Overall transfer level: Independent                        Balance Overall balance assessment: No apparent balance deficits (not formally assessed)                                         ADL either performed or assessed with clinical judgement   ADL Overall ADL's : At baseline                                             Vision  Patient Visual Report: No change from baseline       Perception Perception: Not tested       Praxis Praxis: Not tested       Pertinent Vitals/Pain Pain Assessment Pain Assessment: No/denies pain     Extremity/Trunk Assessment Upper Extremity Assessment Upper Extremity Assessment: Overall WFL for tasks assessed   Lower Extremity Assessment Lower Extremity Assessment: Defer to PT evaluation   Cervical / Trunk Assessment Cervical / Trunk Assessment: Normal   Communication Communication Communication: No apparent difficulties   Cognition Arousal: Alert Behavior During Therapy: Flat affect Overall Cognitive Status: Within Functional Limits for tasks assessed Area of Impairment: Memory                     Memory: Decreased short-term memory         General Comments: mild since CABG     General Comments   VSS    Exercises     Shoulder Instructions      Home Living Family/patient expects to be discharged to:: Private residence Living Arrangements: Spouse/significant other Available  Help at Discharge: Family;Available 24 hours/day Type of Home: House Home Access: Stairs to enter [employer] of Steps: 1 Entrance Stairs-Rails: Right;Left Home Layout: One level     Bathroom Shower/Tub: Chief Strategy Officer: Standard Bathroom Accessibility: Yes How Accessible: Accessible via walker Home Equipment: Rolling Walker (2 wheels);Cane - single point;Shower seat;Hand held shower head   Additional Comments: Bed with ability to elevate head      Prior Functioning/Environment Prior Level of Function : Independent/Modified Independent                        OT Problem List: Impaired balance (sitting and/or standing)      OT Treatment/Interventions:      OT Goals(Current goals can be found in the care plan section) Acute Rehab OT Goals Patient Stated Goal: Hoping to return home OT Goal Formulation: With patient Time For  Goal Achievement: 06/16/23 Potential to Achieve Goals: Good  OT Frequency:      Co-evaluation              AM-PAC OT "6 Clicks" Daily Activity     Outcome Measure Help from another person eating meals?: None Help from another person taking care of personal grooming?: None Help from another person toileting, which includes using toliet, bedpan, or urinal?: None Help from another person bathing (including washing, rinsing, drying)?: None Help from another person to put on and taking off regular upper body clothing?: None Help from another person to put on and taking off regular lower body clothing?: None 6 Click Score: 24   End of Session Nurse Communication: Mobility status  Activity Tolerance: Patient tolerated treatment well Patient left: in bed;with call bell/phone within reach;with family/visitor present  OT Visit Diagnosis: Unsteadiness on feet (R26.81)                Time: 4098-1191 OT Time Calculation (min): 22 min Charges:  OT General Charges $OT Visit: 1 Visit OT Evaluation $OT Eval Moderate Complexity: 1 Mod  06/16/2023  RP, OTR/L  Acute Rehabilitation Services  Office:  [phone]   Suzanna Obey 06/16/2023, 11:39 AM

## 2023-06-17 DIAGNOSIS — I639 Cerebral infarction, unspecified: Secondary | ICD-10-CM | POA: Diagnosis not present

## 2023-06-17 LAB — BASIC METABOLIC PANEL
Anion gap: 6 (ref 5–15)
BUN: 11 mg/dL (ref 6–20)
CO2: 35 mmol/L — ABNORMAL HIGH (ref 22–32)
Calcium: 8.3 mg/dL — ABNORMAL LOW (ref 8.9–10.3)
Chloride: 101 mmol/L (ref 98–111)
Creatinine, Ser: 0.9 mg/dL (ref 0.61–1.24)
GFR, Estimated: >60 mL/min (ref >60)
Glucose, Bld: 104 mg/dL — ABNORMAL HIGH (ref 70–99)
Potassium: 3.9 mmol/L (ref 3.5–5.1)
Sodium: 142 mmol/L (ref 135–145)

## 2023-06-17 MED ORDER — CLOPIDOGREL BISULFATE 75 MG PO TABS
75.0000 mg | ORAL_TABLET | Freq: Every day | ORAL | Status: DC
  Administered 2023-06-17 – 2023-06-19 (×3): 75 mg via ORAL
  Filled 2023-06-17 (×3): qty 1

## 2023-06-17 NOTE — Progress Notes (Signed)
   06/17/23 2300  BiPAP/CPAP/SIPAP  BiPAP/CPAP/SIPAP  (Home machine)  Patient Home Equipment Yes  Safety Check Completed by RT for Home Unit Yes, no issues noted

## 2023-06-17 NOTE — Progress Notes (Signed)
Progress Note   Patient: Joseph [name]. UEA:[id number] DOB: 1965-08-09 DOA: 06/15/2023     2 DOS: the patient was seen and examined on 06/17/2023   Brief hospital course: 57 y.o. male with medical history significant of hypothyroidism, CAD PCI 2021, hypertrophic cardiomyopathy underwent open heart surgery and myectomy at [facility] clinic in November 2024, complicated by acute hypoxic respiratory failure atrial fibrillation, TIA, postoperative atrial fibrillation currently on amiodarone, cardiac amyloidosis, hypoxic respiratory failure postsurgery currently on home oxygen, panic disorder, essential hypertension, hyperlipidemia and BPH. Patient was traveling and was admitted to a hospital in November.  Patient underwent CABG as well as a myomectomy while in the hospital family states he had a possible TIA.  Since he left the hospital that month.  Patient has been having episodes of memory lapses he will seem intermittently confused.  He has been having more trouble with feeling anxious at times and having trouble with his memory.  No seizure no loss of consciousness.  Family was told these could be signs of a possible stroke so he came to the ED for evaluation.  During my evaluation at the bedside patient is alert oriented x 3.  He has a flat affect.  Patient does not have any focal and weakness.  Patient denies any headache, dizziness, tremor, tingling, sensory change, speech change, focal weakness, seizure and loss of consciousness.  No short-term memory issues.  Patient stated sleep disturbance. Patient has chronic bilateral lower extremity edema more on the right side which is chronic in nature since patient has not right-sided hip arthroplasty and dependent lymphedema. Patient is a CPAP machine at home. Since the surgery patient is on home oxygen 2 to 3 L and he continues to desat 88% with ambulation without oxygen. Denies any chest pain and palpitation.  Assessment and Plan: Subacute ischemic  stroke Memory impairment History of TIA > Patient presenting with intermittent confusion and memory issues since November 2024.  Patient sustained a TIA in November while hospitalized for cardiac myectomy.  Currently on aspirin 81 mg daily at home. - MRI brain showed subacute infarction within the left thalamus new from prior head CT 11/11/22024.  Two subcentimeter foci of white matter diffusion-weighted signal abnormality, one within the posterior left frontal lobe and the other within the right parietal lobe. These are compatible with acute/subacute infarcts -LDL and TG elevated, thus now on Crestor 40mg  and Zetia 10mg  -CTA head/neck with no significant stenosis. Subacute lacunar infarct in L thalamus and suspected new infarcts on above MRI -2d echo reviewed. Severe LV hypertrophy with normal LVEF. No intra-atrial shunts -Neuro recommends DAPT x 3 weeks then plavix alone   Hypertrophic cardiomyopathy status post myectomy November 2024 Cardiac amyloidosis History of CAD s/p RCA bypass -Patient recently underwent cardiac myectomy secondary to hypertrophic cardiomyopathy and cardiac amyloidosis and endocardial fibrosis at [facility] [place]. -Echo from 06/12/2023 showed reduced EF 45 to 50%, left ventricular wall motion abnormality consistent left bundle branch block and left ventricular hypertrophy. -repeat 2d echo with EF 55-60% -Pt continued on metoprolol XL 25 mg daily -Per cardiology outpt plan for PET and antibiody -discussed with Cardiology today, recs to continue as planned. Currently on IV lasix   Postoperative atrial fibrillation - Currently on amiodarone.  Not on anticoagulation at home currently.  - Currently rate controlled - Continue amiodarone 200 mg daily and Toprol-XL 25 mg daily. -Continue cardiac monitoring   Essential hypertension Systolic heart failure reduced EF 45 to 50% -Echo from 06/12/2023 showed reduced EF  45 to 50%, left ventricular wall motion abnormality  consistent left bundle branch block and left ventricular hypertrophy. -Blood pressure is well-controlled.  Continue Toprol-XL  -continued on lasix 40mg  IV bid for now, continue per above   History of CAD status post RCA bypass - Continue aspirin, crestor, zetia and Toprol-XL.   Hypothyroidism -Continue Synthyroid 200 mcg daily   Panic disorder Patient has history of panic disorder and frequent panic attack. -was started on Xanax 0.5 mg every 6 hour as needed for anxiety.   Chronic hypoxic respiratory failure since cardiac surgery November 2024 - Continue to check pulse ox and supplemental oxygen 2 L to keep O2 sat above 92%. -Will continue CPAP at bedtime. -will need O2 saturation trials closer to d/c   BPH -Continue Flomax.   History of right-sided hip arthroplasty and back surgery Right-sided lower extremity lymphadenopathy secondary to hip surgery -Continue Lasix 40 IV BID per Cardiology    History of OSA on CPAP -Continue CPAP at bedtime   Subjective: Without complaints this AM  Physical Exam: Vitals:   06/17/23 0014 06/17/23 0335 06/17/23 0800 06/17/23 1102  BP: (!) 136/94 (!) 134/95 98/69 101/60  Pulse: (!) 53 (!) 54 73 71  Resp: 18 16    Temp: 97.7 F (36.5 C) 97.6 F (36.4 C) 97.9 F (36.6 C) 97.8 F (36.6 C)  TempSrc: Oral Oral Oral Oral  SpO2: 95% 96% 94% 94%   General exam: Conversant, in no acute distress Respiratory system: normal chest rise, clear, no audible wheezing Cardiovascular system: regular rhythm, s1-s2 Gastrointestinal system: Nondistended, nontender, pos BS Central nervous system: No seizures, no tremors Extremities: No cyanosis, no joint deformities, BLE edema Skin: No rashes, no pallor Psychiatry: Affect normal // no auditory hallucinations   Data Reviewed:  Labs reviewed: Na 142, K 3.9, Cr 0.90  Family Communication: Pt in room, family at bedside  Disposition: Status is: Inpatient Remains inpatient appropriate because: severity  of illness  Planned Discharge Destination: Home    Author: Rickey Barbara, MD 06/17/2023 2:25 PM  For on call review [email or web address].

## 2023-06-17 NOTE — Progress Notes (Addendum)
STROKE TEAM PROGRESS NOTE   BRIEF HPI [name]. is a 57 y.o. male with history of chronic lower back pain, CAD s/p CABG and obstructive HCM s/p myectomy in November at [facility], acute on chronic HFrEF, non-AL, non-ATTR cardiac amyloidosis, and OSA presenting with confusion, altered mental status for a few days PTA with troubles completing familiar and simple tasks, panic attacks, some reported decrease in oxygen saturations at home. Neuroimaging obtained in the ED notable for subacute strokes and patient was admitted for further evaluation.   SIGNIFICANT HOSPITAL EVENTS - 12/26 MRI brain DWI sequences only (limited due to patient tolerance): Two subcentimeter foci of white matter diffusion-weighted signal abnormality within the posterior left frontal lobe and right parietal lobe consistent with acute/subacute infarcts as well as a subacute infarct within the left thalamus new from [name] 05/01/23.  - 12/27: CPAP adjustments due to SOB/hypoxia with subsequent oxygenation improvement.  Patient unable to tolerate MRI imaging due to severe claustrophobia.  INTERIM HISTORY/SUBJECTIVE Neuro exam notable for some impaired/slowed processing, otherwise unremarkable  Patient unable to tolerate MRI imaging overnight due to severe claustrophobia.  Unable to adequately sedate patient due to hypoxia with rest, laying flat.  Risk for sedation induced hypoxia and respiratory distress outweighs the benefits of MRI imaging at this time.  Will defer further MRI imaging and patient/family agrees with this plan.  OBJECTIVE CBC    Component Value Date/Time   WBC 6.5 06/16/2023 0611   RBC 4.51 06/16/2023 0611   HGB 14.2 06/16/2023 0611   HGB 15.8 08/08/2022 1135   HCT 43.0 06/16/2023 0611   HCT 46.1 08/08/2022 1135   PLT 208 06/16/2023 0611   PLT 227 08/08/2022 1135   MCV 95.3 06/16/2023 0611   MCV 92 08/08/2022 1135   MCH 31.5 06/16/2023 0611   MCHC 33.0 06/16/2023 0611   RDW 13.7 06/16/2023 0611   RDW  15.0 08/08/2022 1135   LYMPHSABS 2.0 06/17/2019 0955   MONOABS 0.7 02/14/2014 0808   EOSABS 0.1 06/17/2019 0955   BASOSABS 0.1 06/17/2019 0955   BMET    Component Value Date/Time   NA 142 06/17/2023 0448   NA 143 08/08/2022 1135   K 3.9 06/17/2023 0448   CL 101 06/17/2023 0448   CO2 35 (H) 06/17/2023 0448   GLUCOSE 104 (H) 06/17/2023 0448   BUN 11 06/17/2023 0448   BUN 20 03/31/2023 0000   CREATININE 0.90 06/17/2023 0448   CALCIUM 8.3 (L) 06/17/2023 0448   EGFR 99.3 03/31/2023 0000   EGFR 79 08/08/2022 1135   GFRNONAA >60 06/17/2023 0448   Lab Results  Component Value Date   HGBA1C 5.8 03/31/2023   Lab Results  Component Value Date   CHOL 256 (H) 06/16/2023   HDL 42 06/16/2023   LDLCALC 179 (H) 06/16/2023   TRIG 174 (H) 06/16/2023   CHOLHDL 6.1 06/16/2023   IMAGING past 24 hours No results found.  Vitals:   06/17/23 0014 06/17/23 0335 06/17/23 0800 06/17/23 1102  BP: (!) 136/94 (!) 134/95 98/69 101/60  Pulse: (!) 53 (!) 54 73 71  Resp: 18 16    Temp: 97.7 F (36.5 C) 97.6 F (36.4 C) 97.9 F (36.6 C) 97.8 F (36.6 C)  TempSrc: Oral Oral Oral Oral  SpO2: 95% 96% 94% 94%   PHYSICAL EXAM General:  Alert, well-nourished, well-developed Caucasian male patient in no acute distress Psych:  Mood and affect appropriate for situation. Affect is somewhat flat throughout. Patient is cooperative with exam.  CV: Regular rate and rhythm on monitor, midline vertical sternal scar noted, well approximated  Respiratory:  Regular, unlabored respirations on room air GI: Abdomen soft and nontender  NEURO:  Mental Status: AA&Ox3, patient is able to give clear and coherent history with short but fluent sentences.  Speech/Language: speech is without dysarthria or aphasia.  Naming, repetition, fluency, and comprehension intact. Memory recall is intact.  Patient states that he feels "brain fog" with delayed processing.   Cranial Nerves:  II: PERRL. Visual fields full.  III, IV,  VI: EOMI. Eyelids elevate symmetrically.  V: Sensation is intact to light touch and symmetrical to face.  VII: Face is symmetrical resting and and with movement VIII: Hearing intact to voice. IX, X: Palate elevates symmetrically. Phonation is normal.  XI: Shoulder shrug 5/5. XII: Tongue is midline without fasciculations. Motor: 5/5 strength to all muscle groups tested.  Tone: is normal and bulk is normal Sensation: Intact to light touch bilaterally. Extinction absent to light touch to DSS.   Coordination: FTN intact bilaterally, HKS: no ataxia in BLE.No drift.  Gait: Stable without use of assistive device as patient is walking around the room independently on examiner arrival.   ASSESSMENT/PLAN  Stroke: Acute/subacute punctate/small posterior left frontal lobe, right parietal lobe infarct  Etiology:  likely small vessel disease vs perioperative from recent CABG.  CT Head: possible chronic lacunar infarct in the left thalamus, new from prior head CT.  CTA head & neck no LVO or significant stenosis in the head or neck.  MRI brain limited: Two subcentimeter foci of white matter diffusion-weighted signal abnormality, one within the posterior left frontal lobe and the other within the right parietal lobe. These are compatible with acute/subacute infarcts.  MRI brain full image -unable to obtain due to patient with severe claustrophobia and concern for hypoxia/respiratory distress with sedation/laying flat as patient reportedly has decreased oxygen saturations with lying still.  2D Echo LVEF 45-50% LDL 179 HgbA1c 5.8 in 03/2023 VTE prophylaxis - SCDs Cardiology has planned 30 day event monitor as outpt to evaluate for possible AF  aspirin 81 mg daily prior to admission, now on aspirin 81 mg daily and clopidogrel 75 mg daily for 3 weeks and then clopidogrel alone. Therapy recommendations:  None Disposition:  pending  History of TIA Patient's wife reports a TIA during admission at [facility] for CABG  procedure at OSH 05/12/23 with transient right facial weakness, RUE weakness, slurred speech, symptoms resolved after CT head exam. No recurrence.  However, pt wife pt had intermittent confusion with delayed processing since that event no MRI was obtained at OSH due to equipment and patient intolerance/extreme anxiety Documentation supports [name] 11/22 and 11/23 without acute findings Patient compliant with ASA at home  Hypertension CAD s/p CABG in November at [name] Obstructive hypertrophic cardiomyopathy s/p myectomy Non-AL non-ATTR cardiac amyloidosis Acute on chronic HFrEF Home meds:  furosemide, Toprol-XL Stable Home meds resumed BUN/Cr stable Long term BP goal - normotensive  Brief postoperative AF from CABG in November 2024 Postoperative AF lasting only one hour per wife [facility] elected not to start [facility] as AF was felt to be brief and post operative On amiodarone (completed 28 days) and aspirin outpatient Denies recurrence    Hyperlipidemia Home meds:  pravastatin 80 mg Zetia discontinued at discharge from OSH in November 2024 LDL 179, goal < 70 Transition pravastatin to rosuvastatin 40 mg PO daily and add Zetia 10 mg daily  Continue statin at discharge  Other Stroke Risk Factors Obesity,  37.30 kg/m2 BMI >/= 30 associated with increased stroke risk, recommend weight loss, diet and exercise as appropriate  Obstructive sleep apnea, on CPAP at home  Other Active Problems OSA with hypoxia Reported oxygen saturations in the 80s at rest at home Hypoxic on CPAP overnight, improved with addition of oxygen PCCM on board Chronic lower back pain  BPH On home flomax Hypothyroidism On home Synthroid Panic attacks On home [facility] day # 2  Lanae Boast, AGACNP-BC [facility] Pager: [phone]  ATTENDING NOTE: I reviewed above note and agree with the assessment and plan. Pt was seen and examined.   Wife at the bedside. Pt pacing in the room.  Wife stated that pt has been anxious and short tampered since his CABG in [facility] last month. Pt stated that he wants go home. However, he still has fluid overload and currently under treatment with lasix. Not able to get MRI done overnight due to anxiety and claustrophobia. Now on DAPT and statin. Cardiology is arranging 30 day monitoring. PT and OT no recs. Will follow up at [facility].   For detailed assessment and plan, please refer to above/below as I have made changes wherever appropriate.   Neurology will sign off. Please call with questions. Pt will follow up with stroke clinic NP at [facility] in about 4 weeks. Thanks for the consult.   Marvel Plan, MD PhD Stroke Neurology 06/17/2023 5:07 PM    To contact Stroke Continuity provider, please refer to [email or web address]. After hours, contact General Neurology

## 2023-06-17 NOTE — Plan of Care (Signed)

## 2023-06-17 NOTE — Progress Notes (Signed)
Physical Therapy Evaluation and Discharge Patient Details Name: Joseph [name]. MRN: [id number] DOB: 12/25/1965 Today's Date: 06/17/2023  History of Present Illness  57 year old with a complex cardiac history including coronary artery disease status post PPCI and RCA CABG, obstructive hypertrophic cardiomyopathy status post myectomy, and non-AL, non-ATTR cardiac amyloidosis, L hip fx with arthroplasty, R ankle fracture  presents with altered mental status and personality changes. These changes were first noted around Christmas, prompting an MRI and emergency room visit 12/26. In the ER, multiple subacute strokes were identified (L frontal, R parietal).  Clinical Impression   Patient evaluated by Physical Therapy with no further acute PT needs identified. All education has been completed and the patient has no further questions.  PT is signing off. Thank you for this referral.  NOTE: wife reports pt has low sats when reclined; noted to be 90% on room air on arrival. With ambulation up to 93% and no dyspnea. Wife/pt concerned that he needs oxygen through his CPAP when sleeping. Deferred discussion to MD.          If plan is discharge home, recommend the following:     Can travel by private vehicle        Equipment Recommendations None recommended by PT  Recommendations for Other Services       Functional Status Assessment Patient has not had a recent decline in their functional status     Precautions / Restrictions Precautions Precautions: None Restrictions Weight Bearing Restrictions Per Provider Order: No      Mobility  Bed Mobility Overal bed mobility: Independent                  Transfers Overall transfer level: Independent                      Ambulation/Gait Ambulation/Gait assistance: Independent Gait Distance (Feet): 180 Feet Assistive device: None Gait Pattern/deviations: Antalgic   Gait velocity interpretation: 1.31 - 2.62 ft/sec,  indicative of limited community ambulator   General Gait Details: no drift or imbalance with head turns or avoiding obstacles  Stairs Stairs: Yes Stairs assistance: Independent Stair Management: No rails, Forwards Number of Stairs: 1 General stair comments: also able to do alternating step taps x 8 with no imbalance  Wheelchair Mobility     Tilt Bed    Modified Rankin (Stroke Patients Only)       Balance Overall balance assessment: Independent                                           Pertinent Vitals/Pain Pain Assessment Pain Assessment: Faces Faces Pain Scale: Hurts little more Pain Location: left hip Pain Descriptors / Indicators: Aching Pain Intervention(s): Limited activity within patient's tolerance, Monitored during session, Premedicated before session    Home Living Family/patient expects to be discharged to:: Private residence Living Arrangements: Spouse/significant other Available Help at Discharge: Family;Available 24 hours/day Type of Home: House Home Access: Stairs to enter Entrance Stairs-Rails: Doctor, general practice of Steps: 1   Home Layout: One level Home Equipment: Agricultural consultant (2 wheels);Cane - single point;Shower seat;Hand held shower head Additional Comments: Bed with ability to elevate head    Prior Function Prior Level of Function : Independent/Modified Independent             Mobility Comments: occasional use of cane or RW due to left hip  pain       Extremity/Trunk Assessment   Upper Extremity Assessment Upper Extremity Assessment: Defer to OT evaluation    Lower Extremity Assessment Lower Extremity Assessment: Overall WFL for tasks assessed (Rt ankle with persistent edema since ORIF)    Cervical / Trunk Assessment Cervical / Trunk Assessment: Normal  Communication   Communication Communication: No apparent difficulties  Cognition Arousal: Alert Behavior During Therapy: Flat  affect Overall Cognitive Status: Within Functional Limits for tasks assessed                                          General Comments General comments (skin integrity, edema, etc.): Wife present. Notes his sats run low when pt at rest and reclined. 90% on room air with HOB elevated. 93% during ambulation.    Exercises     Assessment/Plan    PT Assessment Patient does not need any further PT services  PT Problem List         PT Treatment Interventions      PT Goals (Current goals can be found in the Care Plan section)  Acute Rehab PT Goals PT Goal Formulation: All assessment and education complete, DC therapy    Frequency       Co-evaluation               AM-PAC PT "6 Clicks" Mobility  Outcome Measure Help needed turning from your back to your side while in a flat bed without using bedrails?: None Help needed moving from lying on your back to sitting on the side of a flat bed without using bedrails?: None Help needed moving to and from a bed to a chair (including a wheelchair)?: None Help needed standing up from a chair using your arms (e.g., wheelchair or bedside chair)?: None Help needed to walk in hospital room?: None Help needed climbing 3-5 steps with a railing? : None 6 Click Score: 24    End of Session   Activity Tolerance: Patient tolerated treatment well Patient left: in bed;with call bell/phone within reach;with family/visitor present   PT Visit Diagnosis: Other abnormalities of gait and mobility (R26.89)    Time: 1136-1150 PT Time Calculation (min) (ACUTE ONLY): 14 min   Charges:   PT Evaluation $PT Eval Low Complexity: 1 Low   PT General Charges $$ ACUTE PT VISIT: 1 Visit          Jerolyn Center, PT Acute Rehabilitation Services  Office [phone]  Zena Amos 06/17/2023, 12:01 PM

## 2023-06-18 DIAGNOSIS — I639 Cerebral infarction, unspecified: Secondary | ICD-10-CM | POA: Diagnosis not present

## 2023-06-18 LAB — CBC
HCT: 40.9 % (ref 39.0–52.0)
Hemoglobin: 13.7 g/dL (ref 13.0–17.0)
MCH: 31.8 pg (ref 26.0–34.0)
MCHC: 33.5 g/dL (ref 30.0–36.0)
MCV: 94.9 fL (ref 80.0–100.0)
Platelets: 191 10*3/uL (ref 150–400)
RBC: 4.31 MIL/uL (ref 4.22–5.81)
RDW: 13.6 % (ref 11.5–15.5)
WBC: 7.8 10*3/uL (ref 4.0–10.5)
nRBC: 0 % (ref 0.0–0.2)

## 2023-06-18 LAB — BASIC METABOLIC PANEL
Anion gap: 5 (ref 5–15)
BUN: 16 mg/dL (ref 6–20)
CO2: 33 mmol/L — ABNORMAL HIGH (ref 22–32)
Calcium: 7.9 mg/dL — ABNORMAL LOW (ref 8.9–10.3)
Chloride: 102 mmol/L (ref 98–111)
Creatinine, Ser: 1 mg/dL (ref 0.61–1.24)
GFR, Estimated: >60 mL/min (ref >60)
Glucose, Bld: 102 mg/dL — ABNORMAL HIGH (ref 70–99)
Potassium: 3.5 mmol/L (ref 3.5–5.1)
Sodium: 140 mmol/L (ref 135–145)

## 2023-06-18 MED ORDER — FUROSEMIDE 40 MG PO TABS
40.0000 mg | ORAL_TABLET | Freq: Two times a day (BID) | ORAL | Status: DC
  Administered 2023-06-18 – 2023-06-19 (×2): 40 mg via ORAL
  Filled 2023-06-18 (×2): qty 1

## 2023-06-18 MED ORDER — HYDROXYZINE HCL 10 MG PO TABS
10.0000 mg | ORAL_TABLET | Freq: Three times a day (TID) | ORAL | Status: DC | PRN
  Administered 2023-06-18: 10 mg via ORAL
  Filled 2023-06-18 (×2): qty 1

## 2023-06-18 NOTE — Plan of Care (Signed)

## 2023-06-18 NOTE — Progress Notes (Signed)
Pt. O2 in bed before ambulation 93%, with walking O2 sat drop to 89%

## 2023-06-18 NOTE — Progress Notes (Signed)
Progress Note   Patient: Joseph [name]. ZOX:[id number] DOB: 1966/05/01 DOA: 06/15/2023     3 DOS: the patient was seen and examined on 06/18/2023   Brief hospital course: 57 y.o. male with medical history significant of hypothyroidism, CAD PCI 2021, hypertrophic cardiomyopathy underwent open heart surgery and myectomy at [facility] in November 2024, complicated by acute hypoxic respiratory failure atrial fibrillation, TIA, postoperative atrial fibrillation currently on amiodarone, cardiac amyloidosis, hypoxic respiratory failure postsurgery currently on home oxygen, panic disorder, essential hypertension, hyperlipidemia and BPH. Patient was traveling and was admitted to a hospital in November.  Patient underwent CABG as well as a myomectomy while in the hospital family states he had a possible TIA.  Since he left the hospital that month.  Patient has been having episodes of memory lapses he will seem intermittently confused.  He has been having more trouble with feeling anxious at times and having trouble with his memory.  No seizure no loss of consciousness.  Family was told these could be signs of a possible stroke so he came to the ED for evaluation.  During my evaluation at the bedside patient is alert oriented x 3.  He has a flat affect.  Patient does not have any focal and weakness.  Patient denies any headache, dizziness, tremor, tingling, sensory change, speech change, focal weakness, seizure and loss of consciousness.  No short-term memory issues.  Patient stated sleep disturbance. Patient has chronic bilateral lower extremity edema more on the right side which is chronic in nature since patient has not right-sided hip arthroplasty and dependent lymphedema. Patient is a CPAP machine at home. Since the surgery patient is on home oxygen 2 to 3 L and he continues to desat 88% with ambulation without oxygen. Denies any chest pain and palpitation.  Assessment and Plan: Subacute ischemic  stroke Memory impairment History of TIA > Patient presenting with intermittent confusion and memory issues since November 2024.  Patient sustained a TIA in November while hospitalized for cardiac myectomy.  Currently on aspirin 81 mg daily at home. - MRI brain showed subacute infarction within the left thalamus new from prior head CT 11/11/22024.  Two subcentimeter foci of white matter diffusion-weighted signal abnormality, one within the posterior left frontal lobe and the other within the right parietal lobe. These are compatible with acute/subacute infarcts -LDL and TG elevated, thus now on Crestor 40mg  and Zetia 10mg  -CTA head/neck with no significant stenosis. Subacute lacunar infarct in L thalamus and suspected new infarcts on above MRI -2d echo reviewed. Severe LV hypertrophy with normal LVEF. No intra-atrial shunts -Neuro recommends DAPT x 3 weeks then plavix alone. Neuro has since signed off   Hypertrophic cardiomyopathy status post myectomy November 2024 Cardiac amyloidosis History of CAD s/p RCA bypass -Patient recently underwent cardiac myectomy secondary to hypertrophic cardiomyopathy and cardiac amyloidosis and endocardial fibrosis at [facility] [place]. -Echo from 06/12/2023 showed reduced EF 45 to 50%, left ventricular wall motion abnormality consistent left bundle branch block and left ventricular hypertrophy. -repeat 2d echo with EF 55-60% -Pt continued on metoprolol XL 25 mg daily -Per cardiology outpt plan for PET and antibiody -discussed with Cardiology on 12/28, recs to continue IV lasix as planned. Follow up with Cardiology recs   Postoperative atrial fibrillation - Currently on amiodarone.  Not on anticoagulation at home currently.  - Currently rate controlled - Continue amiodarone 200 mg daily and Toprol-XL 25 mg daily.   Essential hypertension Systolic heart failure reduced EF 45 to 50% -  Echo from 06/12/2023 showed reduced EF 45 to 50%, left ventricular wall  motion abnormality consistent left bundle branch block and left ventricular hypertrophy. -Blood pressure is well-controlled.  Continue Toprol-XL  -continued on lasix 40mg  IV bid for now, continue per above   History of CAD status post RCA bypass - Continue aspirin, crestor, zetia and Toprol-XL.   Hypothyroidism -Continue Synthyroid 200 mcg daily   Panic disorder Patient has history of panic disorder and frequent panic attack. -was started on Xanax 0.5 mg every 6 hour as needed for anxiety.   Chronic hypoxic respiratory failure since cardiac surgery November 2024 - Continue to check pulse ox and supplemental oxygen 2 L to keep O2 sat above 92%. -Will continue CPAP at bedtime. -will need O2 saturation trials closer to d/c   BPH -Continue Flomax.   History of right-sided hip arthroplasty and back surgery Right-sided lower extremity lymphadenopathy secondary to hip surgery -Continue Lasix 40 IV BID per recent Cardiology recs    History of OSA on CPAP -Continue CPAP at bedtime   Subjective: Very eager to go home  Physical Exam: Vitals:   06/18/23 0438 06/18/23 0741 06/18/23 0800 06/18/23 1136  BP: 102/66 120/75  107/67  Pulse: (!) 58 (!) 58  (!) 56  Resp: 18     Temp: (!) 97.3 F (36.3 C) 99.2 F (37.3 C)  98 F (36.7 C)  TempSrc: Oral Oral  Oral  SpO2: 97% 93%  (!) 88%  Weight:   122.9 kg    General exam: Awake, laying in bed, in nad Respiratory system: Normal respiratory effort, no wheezing Cardiovascular system: regular rate, s1, s2 Gastrointestinal system: Soft, nondistended, positive BS Central nervous system: CN2-12 grossly intact, strength intact Extremities: Perfused, no clubbing Skin: Normal skin turgor, no notable skin lesions seen Psychiatry: Mood normal // no visual hallucinations   Data Reviewed:  Labs reviewed: Na 140, K 3.5, Cr 1.00, WBC 7.8, Hgb 13.7, Plts 191   Family Communication: Pt in room, family at bedside  Disposition: Status is:  Inpatient Remains inpatient appropriate because: severity of illness  Planned Discharge Destination: Home    Author: Rickey Barbara, MD 06/18/2023 4:15 PM  For on call review [email or web address].

## 2023-06-18 NOTE — Plan of Care (Signed)
  Problem: Education: Goal: Knowledge of disease or condition will improve Outcome: Progressing Goal: Knowledge of secondary prevention will improve (MUST DOCUMENT ALL) Outcome: Progressing Goal: Knowledge of patient specific risk factors will improve Loraine Leriche N/A or DELETE if not current risk factor) Outcome: Progressing   Problem: Education: Goal: Knowledge of disease or condition will improve Outcome: Progressing Goal: Knowledge of secondary prevention will improve (MUST DOCUMENT ALL) Outcome: Progressing Goal: Knowledge of patient specific risk factors will improve Loraine Leriche N/A or DELETE if not current risk factor) Outcome: Progressing

## 2023-06-18 NOTE — Progress Notes (Signed)
Progress Note  Patient Name: Joseph [name]. Date of Encounter: 06/18/2023  Primary Cardiologist: Tonny Bollman, MD   Subjective   Patient seen and examined at his bedside. His wife and father in the room when I arrived.   Inpatient Medications    Scheduled Meds:  aspirin EC  81 mg Oral Daily   busPIRone  15 mg Oral BID   clopidogrel  75 mg Oral Daily   ezetimibe  10 mg Oral Daily   furosemide  40 mg Intravenous BID   levothyroxine  200 mcg Oral Q0600   metoprolol succinate  25 mg Oral Daily   rosuvastatin  40 mg Oral Daily   sodium chloride flush  3 mL Intravenous Q12H   tamsulosin  0.4 mg Oral QHS   Continuous Infusions:  PRN Meds: acetaminophen **OR** acetaminophen, bismuth subsalicylate, hydrALAZINE, hydrOXYzine, nitroGLYCERIN, ondansetron **OR** ondansetron (ZOFRAN) IV, oxyCODONE, senna-docusate, sodium chloride flush   Vital Signs    Vitals:   06/18/23 0438 06/18/23 0741 06/18/23 0800 06/18/23 1136  BP: 102/66 120/75  107/67  Pulse: (!) 58 (!) 58  (!) 56  Resp: 18     Temp: (!) 97.3 F (36.3 C) 99.2 F (37.3 C)  98 F (36.7 C)  TempSrc: Oral Oral  Oral  SpO2: 97% 93%  (!) 88%  Weight:   122.9 kg     Intake/Output Summary (Last 24 hours) at 06/18/2023 1635 Last data filed at 06/18/2023 0414 Gross per 24 hour  Intake 483 ml  Output 500 ml  Net -17 ml   Filed Weights   06/18/23 0800  Weight: 122.9 kg    Telemetry    Sinus rhythm  - Personally Reviewed  ECG     - Personally Reviewed  Physical Exam    General: Comfortable Head: Atraumatic, normal size  Eyes: PEERLA, EOMI  Neck: Supple, normal JVD Cardiac: Normal S1, S2; RRR; no murmurs, rubs, or gallops Lungs: Clear to auscultation bilaterally Abd: Soft, nontender, no hepatomegaly  Ext: warm, no edema Musculoskeletal: No deformities, BUE and BLE strength normal and equal Skin: Warm and dry, no rashes   Neuro: Alert and oriented to person, place, time, and situation, CNII-XII  grossly intact, no focal deficits  Psych: Normal mood and affect   Labs    Chemistry Recent Labs  Lab 06/15/23 1235 06/16/23 0611 06/17/23 0448 06/18/23 0508  NA 139 140 142 140  K 3.9 3.5 3.9 3.5  CL 101 102 101 102  CO2 29 32 35* 33*  GLUCOSE 109* 103* 104* 102*  BUN 8 6 11 16   CREATININE 0.79 1.00 0.90 1.00  CALCIUM 8.5* 7.8* 8.3* 7.9*  PROT 5.8* 4.3*  --   --   ALBUMIN 2.2* 1.6*  --   --   AST 50* 36  --   --   ALT 37 29  --   --   ALKPHOS 107 82  --   --   BILITOT 0.5 0.6  --   --   GFRNONAA >60 >60 >60 >60  ANIONGAP 9 6 6 5      Hematology Recent Labs  Lab 06/15/23 1235 06/16/23 0611 06/18/23 0508  WBC 9.0 6.5 7.8  RBC 5.12 4.51 4.31  HGB 16.4 14.2 13.7  HCT 48.3 43.0 40.9  MCV 94.3 95.3 94.9  MCH 32.0 31.5 31.8  MCHC 34.0 33.0 33.5  RDW 13.5 13.7 13.6  PLT 267 208 191    Cardiac EnzymesNo results for input(s): "TROPONINI" in the last 168  hours. No results for input(s): "TROPIPOC" in the last 168 hours.   BNP Recent Labs  Lab 06/15/23 1235  BNP 316.7*     DDimer No results for input(s): "DDIMER" in the last 168 hours.   Radiology    No results found.  Cardiac Studies   Echo   Patient Profile     57 y.o. male with  coronary artery disease status post PPCI and RCA CABG, obstructive hypertrophic cardiomyopathy status post myectomy 04/2023, and non-AL, non-ATTR cardiac amyloidosis,OSA, who presented 12/26 with altered mental status   Assessment & Plan    Multiple subacute strokes  Acute on chronic heart failure Hx of CAD Cardiac Amyloidosis  Transition po lasix 40 mg BID today - can be discharge on this.  Continue On ASA, toprol XL 25 mg and Pravachol 80 mg every day - no angina symptoms at this time.  Blood pressure is acceptable.  Cardiac amyloidosis - will defer to outpatient cardiologist for follow up imaging His Amio was stop prior no need to restart this Patient and his wife with concern of CPAP with oxygen at night - will defer  to primary team if need for pulm eval.   Will sign off at this time - we have scheduled follow up.   For questions or updates, please contact [facility] Please consult [email or web address] for contact info under Cardiology/STEMI.      Osvaldo Shipper, DO  06/18/2023, 4:35 PM

## 2023-06-19 ENCOUNTER — Other Ambulatory Visit: Payer: Self-pay

## 2023-06-19 ENCOUNTER — Encounter: Payer: Self-pay | Admitting: Internal Medicine

## 2023-06-19 ENCOUNTER — Other Ambulatory Visit (HOSPITAL_COMMUNITY): Payer: Self-pay

## 2023-06-19 DIAGNOSIS — I639 Cerebral infarction, unspecified: Secondary | ICD-10-CM | POA: Diagnosis not present

## 2023-06-19 DIAGNOSIS — N401 Enlarged prostate with lower urinary tract symptoms: Secondary | ICD-10-CM

## 2023-06-19 DIAGNOSIS — I10 Essential (primary) hypertension: Secondary | ICD-10-CM

## 2023-06-19 DIAGNOSIS — E039 Hypothyroidism, unspecified: Secondary | ICD-10-CM

## 2023-06-19 LAB — BASIC METABOLIC PANEL
Anion gap: 10 (ref 5–15)
BUN: 11 mg/dL (ref 6–20)
CO2: 33 mmol/L — ABNORMAL HIGH (ref 22–32)
Calcium: 8 mg/dL — ABNORMAL LOW (ref 8.9–10.3)
Chloride: 102 mmol/L (ref 98–111)
Creatinine, Ser: 0.88 mg/dL (ref 0.61–1.24)
GFR, Estimated: >60 mL/min (ref >60)
Glucose, Bld: 101 mg/dL — ABNORMAL HIGH (ref 70–99)
Potassium: 4 mmol/L (ref 3.5–5.1)
Sodium: 145 mmol/L (ref 135–145)

## 2023-06-19 LAB — CBC
HCT: 43.1 % (ref 39.0–52.0)
Hemoglobin: 14.3 g/dL (ref 13.0–17.0)
MCH: 31.4 pg (ref 26.0–34.0)
MCHC: 33.2 g/dL (ref 30.0–36.0)
MCV: 94.5 fL (ref 80.0–100.0)
Platelets: 188 10*3/uL (ref 150–400)
RBC: 4.56 MIL/uL (ref 4.22–5.81)
RDW: 13.6 % (ref 11.5–15.5)
WBC: 7.8 10*3/uL (ref 4.0–10.5)
nRBC: 0 % (ref 0.0–0.2)

## 2023-06-19 MED ORDER — EZETIMIBE 10 MG PO TABS
10.0000 mg | ORAL_TABLET | Freq: Every day | ORAL | 1 refills | Status: DC
  Filled 2023-06-19: qty 30, 30d supply, fill #0

## 2023-06-19 MED ORDER — ASPIRIN 81 MG PO TBEC
81.0000 mg | DELAYED_RELEASE_TABLET | Freq: Every day | ORAL | 0 refills | Status: DC
  Filled 2023-06-19: qty 21, 21d supply, fill #0

## 2023-06-19 MED ORDER — ROSUVASTATIN CALCIUM 40 MG PO TABS
40.0000 mg | ORAL_TABLET | Freq: Every day | ORAL | 1 refills | Status: DC
  Filled 2023-06-19: qty 30, 30d supply, fill #0

## 2023-06-19 MED ORDER — FUROSEMIDE 40 MG PO TABS
40.0000 mg | ORAL_TABLET | Freq: Two times a day (BID) | ORAL | 1 refills | Status: DC
  Filled 2023-06-19: qty 60, 30d supply, fill #0

## 2023-06-19 MED ORDER — HYDROXYZINE HCL 10 MG PO TABS
10.0000 mg | ORAL_TABLET | Freq: Two times a day (BID) | ORAL | 0 refills | Status: DC | PRN
  Filled 2023-06-19: qty 30, 15d supply, fill #0

## 2023-06-19 MED ORDER — CLOPIDOGREL BISULFATE 75 MG PO TABS
75.0000 mg | ORAL_TABLET | Freq: Every day | ORAL | 1 refills | Status: DC
  Filled 2023-06-19: qty 30, 30d supply, fill #0

## 2023-06-19 MED ORDER — TAMSULOSIN HCL 0.4 MG PO CAPS: 0.4000 mg | ORAL_CAPSULE | Freq: Every day | ORAL | Status: AC

## 2023-06-19 NOTE — TOC Transition Note (Signed)
Transition of Care [facility]) - Discharge Note   Patient Details  Name: Joseph [name]. MRN: [id number] Date of Birth: 09-06-1965  Transition of [facility] CM/SW Contact:  Kermit Balo, RN Phone Number: 06/19/2023, 11:06 AM   Clinical Narrative:     Pt is discharging home with spouse. Pt did not qualify for home oxygen. Wife and MD aware.  Wife to provide transportation home.  Final next level of care: Home/Self Care Barriers to Discharge: No Barriers Identified   Patient Goals and CMS Choice            Discharge Placement                       Discharge Plan and Services Additional resources added to the After Visit Summary for     Discharge Planning Services: CM Consult                                 Social Drivers of Health (SDOH) Interventions SDOH Screenings   Food Insecurity: No Food Insecurity (06/17/2023)  Housing: Low Risk  (06/16/2023)  Transportation Needs: No Transportation Needs (06/16/2023)  Utilities: Not At Risk (06/16/2023)  Financial Resource Strain: Low Risk  (12/31/2020)   Received from [facility], [facility]  Physical Activity: Inactive (12/31/2020)   Received from [facility], [facility]  Social Connections: Unknown (11/01/2021)   Received from [facility], [facility]  Stress: No Stress Concern Present (12/31/2020)   Received from [facility], [facility]  Tobacco Use: Medium Risk (06/15/2023)     Readmission Risk Interventions     No data to display

## 2023-06-19 NOTE — Progress Notes (Signed)
Pt IV was removed because it hurt him when flushed. Pt didn't want iv restarted because he is going home today.

## 2023-06-19 NOTE — Care Management Important Message (Signed)
Important Message  Patient Details  Name: Joseph [name]. MRN: [id number] Date of Birth: October 18, 1965   Important Message Given:  Yes - Medicare IM     Dorena Bodo 06/19/2023, 3:43 PM

## 2023-06-19 NOTE — Progress Notes (Signed)
SATURATION QUALIFICATIONS: (This note is used to comply with regulatory documentation for home oxygen)  Patient Saturations on Room Air at Rest = 95%  Patient Saturations on Room Air while Ambulating = 91%    Patient denied SOB during ambulation/walking test.

## 2023-06-19 NOTE — Discharge Summary (Addendum)
PATIENT DETAILS Name: Joseph [name]. Age: 57 y.o. Sex: male Date of Birth: [birth year] MRN: [id number]. Admitting Physician: Tereasa Coop, MD WUJ:[id number], Lucita Lora, MD  Admit Date: 06/15/2023 Discharge date: 06/19/2023  Recommendations for Outpatient Follow-up:  Follow up with PCP in 1-2 weeks Please obtain CMP/CBC in one week Please ensure follow up with neurology and cardiology  Admitted From:  Home  Disposition: Home   Discharge Condition: good  CODE STATUS:   Code Status: Full Code   Diet recommendation:  Diet Order             Diet - low sodium heart healthy           Diet Heart Room service appropriate? Yes; Fluid consistency: Thin  Diet effective now                    Brief Summary: 57 year old with history of HOCM-s/p myomectomy 04/2023, cardiac amyloidosis, CAD-s/p PPI and RCA CABG-presented with altered mental status-upon further workup found to have CVA.  Significant workup MRI brain: Poor study (claustrophobia)-acute/subacute CVA in left frontal/right parietal lobe CTA head/neck: No LVO/significant stenosis TTE: EF 45-50%, LDL: 179 A1c :5.8  Brief Hospital Course: Acute/subacute CVA Overall improved-evaluated by neurology-workup as above-recommendations are for aspirin/Plavix x 3 weeks followed by Plavix alone. Cardiology as planned outpatient 30-day event monitor for possible A-fib.  Acute on chronic HFrEF Treated with IV Lasix Followed closely by cardiology Per cardiology-discharged on Lasix 40 mg p.o. twice daily  HOCM s/p myomectomy November 2024 CAD s/p PCI and RCA CABG November 2024 Cardiac amyloidosis Follow-up with cardiology Continue to blocker On DAPT as above Will be on high intensity statin-see below.  History postoperative A-fib Per cardiology-no need to resume amiodarone. Outpatient cardiac monitoring planned.  HLD  LDL not at goal Pravastatin switched to rosuvastatin-Zetia added Follow-up with  cardiology/PCP  BPH Flomax  Hypothyroidism Synthroid  Anxiety disorder/panic attacks Already on BuSpar Spoke with spouse-wants to continue Atarax as needed on discharge.  Long discussion-regarding minimizing use of medications that can cause drowsiness-given multiple medical issues including OSA.  OSA CPAP nightly Although initially thought to be on oxygen prior to this hospitalization-per case management-he was not on oxygen and only on CPAP.  He was ambulated by the nursing staff-O2 saturation with ambulation was around 91% PCP to continue to reassess if he needs oxygen in the outpatient setting.  Obesity: Estimated body mass index is 36.75 kg/m as calculated from the following:   Height as of this encounter: 6' (1.829 m).   Weight as of this encounter: 122.9 kg.    Discharge Diagnoses:  Principal Problem:   Subacute ischemic stroke [facility]) Active Problems:   Memory impairment   History of TIA (transient ischemic attack)   Hypokalemia   Hyperlipidemia   Essential hypertension   OSA (obstructive sleep apnea)   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 [facility])   Cardiac amyloidosis (HCC)   Hypothyroidism   History of CAD (coronary artery disease)   Paroxysmal atrial fibrillation (HCC)   Hypoxic respiratory failure since cardiac surgery 04/2023 (HCC)   Panic disorder   BPH (benign prostatic hyperplasia)   History of hip surgery   Discharge Instructions:  Activity:  As tolerated with Full fall precautions use walker/cane & assistance as needed   Discharge Instructions     Ambulatory referral to Neurology   Complete by: As directed    Follow up with stroke clinic NP George Hugh or  Darrol Angel, if both not available, consider Manson Allan, or Ahern) at [facility] in about 4 weeks. Thanks.   Call MD for:  extreme fatigue   Complete by: As directed    Call MD for:  persistant dizziness or light-headedness   Complete  by: As directed    Diet - low sodium heart healthy   Complete by: As directed    Discharge instructions   Complete by: As directed    Follow with Primary MD  Richardean Chimera, MD in 1-2 weeks  Take aspirin/Plavix for 21 days-after 21 days-stop aspirin and continue on Plavix  Your cholesterol medications has been switched from pravastatin to rosuvastatin and Zetia  Please follow-up with cardiology and neurology-the office will give you a call for follow-up appointment.  If you do not hear from them-please give them a call.  Please get a complete blood count and chemistry panel checked by your Primary MD at your next visit, and again as instructed by your Primary MD.  Get Medicines reviewed and adjusted: Please take all your medications with you for your next visit with your Primary MD  Laboratory/radiological data: Please request your Primary MD to go over all hospital tests and procedure/radiological results at the follow up, please ask your Primary MD to get all Hospital records sent to his/her office.  In some cases, they will be blood work, cultures and biopsy results pending at the time of your discharge. Please request that your primary care M.D. follows up on these results.  Also Note the following: If you experience worsening of your admission symptoms, develop shortness of breath, life threatening emergency, suicidal or homicidal thoughts you must seek medical attention immediately by calling 911 or calling your MD immediately  if symptoms less severe.  You must read complete instructions/literature along with all the possible adverse reactions/side effects for all the Medicines you take and that have been prescribed to you. Take any new Medicines after you have completely understood and accpet all the possible adverse reactions/side effects.   Do not drive when taking Pain medications or sleeping medications (Benzodaizepines)  Do not take more than prescribed Pain, Sleep and  Anxiety Medications. It is not advisable to combine anxiety,sleep and pain medications without talking with your primary care practitioner  Special Instructions: If you have smoked or chewed Tobacco  in the last 2 yrs please stop smoking, stop any regular Alcohol  and or any Recreational drug use.  Wear Seat belts while driving.  Please note: You were cared for by a hospitalist during your hospital stay. Once you are discharged, your primary care physician will handle any further medical issues. Please note that NO REFILLS for any discharge medications will be authorized once you are discharged, as it is imperative that you return to your primary care physician (or establish a relationship with a primary care physician if you do not have one) for your post hospital discharge needs so that they can reassess your need for medications and monitor your lab values.   Increase activity slowly   Complete by: As directed       Allergies as of 06/19/2023       Reactions   Alpha-gal Anaphylaxis   Beef, pork   Lisinopril Anaphylaxis   Penicillins Anaphylaxis, Other (See Comments)   **CEFAZOLIN received on 02/07/2014 and 04/08/2015 with no documented ADRs**. PCN reaction causing immediate rash, facial/tongue/throat swelling, SOB or lightheadedness with hypotension: unknown PCN reaction causing severe rash involving mucus membranes or skin necrosis:  unknown PCN reaction that required hospitalization unknown PCN reaction occurring within the last 10 years: no If all of the above answers are "NO", then may proceed with Cephalosporin use.   Gadavist [gadobutrol] Nausea And Vomiting   Pt vomits with Gadavist contrast 09/14/2022 during cardiac MRI scan    Morphine Other (See Comments)   Panic attacks   Tetanus Toxoid Other (See Comments)   REACTION: unknown        Medication List     STOP taking these medications    amiodarone 200 MG tablet Commonly known as: PACERONE    diphenhydramine-acetaminophen 25-500 MG Tabs tablet Commonly known as: TYLENOL PM   pravastatin 80 MG tablet Commonly known as: PRAVACHOL       TAKE these medications    aspirin 81 MG tablet Take 1 tablet (81 mg total) by mouth daily. Take for 21 days and then stop. What changed: additional instructions   busPIRone 15 MG tablet Commonly known as: BUSPAR Take 15 mg by mouth 2 (two) times daily.   clopidogrel 75 MG tablet Commonly known as: PLAVIX Take 1 tablet (75 mg total) by mouth daily. Start taking on: June 20, 2023   cyanocobalamin 1000 MCG tablet Commonly known as: VITAMIN B12 Take 1,000 mcg by mouth daily.   EPINEPHrine 0.3 mg/0.3 mL Soaj injection Commonly known as: EpiPen 2-Pak Inject 0.3 mLs (0.3 mg total) into the muscle as needed for anaphylaxis.   ezetimibe 10 MG tablet Commonly known as: ZETIA Take 1 tablet (10 mg total) by mouth daily. Start taking on: June 20, 2023   furosemide 40 MG tablet Commonly known as: LASIX Take 1 tablet (40 mg total) by mouth 2 (two) times daily. What changed: when to take this   hydrOXYzine 10 MG tablet Commonly known as: ATARAX Take 1 tablet (10 mg total) by mouth 2 (two) times daily as needed for anxiety.   levothyroxine 200 MCG tablet Commonly known as: SYNTHROID Take 1 tablet (200 mcg total) by mouth daily before breakfast.   metoprolol succinate 25 MG 24 hr tablet Commonly known as: TOPROL-XL Take 1 tablet by mouth daily.   multivitamin with minerals tablet Take 1 tablet by mouth daily.   nitroGLYCERIN 0.4 MG SL tablet Commonly known as: NITROSTAT Place 0.4 mg under the tongue every 5 (five) minutes as needed for chest pain.   Oxycodone HCl 20 MG Tabs Take 1 tablet (20 mg total) by mouth as directed. What changed:  when to take this reasons to take this   Pepto-Bismol 262 MG Tabs Generic drug: Bismuth Subsalicylate Take 524 mg by mouth daily as needed (indigestion).   pyridoxine 100 MG  tablet Commonly known as: B-6 Take 100 mg by mouth daily.   rosuvastatin 40 MG tablet Commonly known as: CRESTOR Take 1 tablet (40 mg total) by mouth daily. Start taking on: June 20, 2023   tamsulosin 0.4 MG Caps capsule Commonly known as: FLOMAX Take 1 capsule (0.4 mg total) by mouth at bedtime.   vitamin C 1000 MG tablet Take 500 mg by mouth daily.   Vitamin D 50 MCG (2000 UT) tablet Take 2,000 Units by mouth daily.        Follow-up Information     [facility]. Schedule an appointment as soon as possible for a visit in 1 month(s).   Specialty: Neurology Contact information: [address] [place] [address] [phone]        Sharlene Dory, PA-C Follow up  on 07/07/2023.   Specialty: Cardiology Why: 2:40PM. Cardiology follow up Contact information: [address] [place] [address] [phone]         Richardean Chimera, MD. Schedule an appointment as soon as possible for a visit in 1 week(s).   Specialty: Family Medicine Contact information: [address] [place] [address] [phone]                Allergies  Allergen Reactions   Alpha-Gal Anaphylaxis    Beef, pork   Lisinopril Anaphylaxis   Penicillins Anaphylaxis and Other (See Comments)    **CEFAZOLIN received on 02/07/2014 and 04/08/2015 with no documented ADRs**.  PCN reaction causing immediate rash, facial/tongue/throat swelling, SOB or lightheadedness with hypotension: unknown PCN reaction causing severe rash involving mucus membranes or skin necrosis: unknown PCN reaction that required hospitalization unknown PCN reaction occurring within the last 10 years: no If all of the above answers are "NO", then may proceed with Cephalosporin use.    Gadavist [Gadobutrol] Nausea And Vomiting    Pt vomits with Gadavist contrast 09/14/2022 during cardiac MRI scan    Morphine Other (See Comments)    Panic attacks    Tetanus Toxoid Other (See Comments)    REACTION: unknown     Other Procedures/Studies: ECHOCARDIOGRAM COMPLETE Result Date: 06/16/2023    ECHOCARDIOGRAM REPORT   Patient Name:   Huxlee Rago. Date of Exam: 06/16/2023 Medical Rec #:  [id number]         Height:       72.0 in Accession #:    [id number]        Weight:       275.0 lb Date of Birth:  04-19-1966          BSA:          2.439 m Patient Age:    57 years          BP:           104/74 mmHg Patient Gender: M                 HR:           68 bpm. Exam Location:  Inpatient Procedure: 2D Echo, Cardiac Doppler and Color Doppler Indications:    Stroke I63.9  History:        Patient has prior history of Echocardiogram examinations, most                 recent 06/12/2023. Cardiomyopathy, CAD, Stroke and TIA,                 Arrythmias:Atrial Fibrillation; Risk Factors:Hypertension,                 Dyslipidemia and Sleep Apnea.  Sonographer:    Lucendia Herrlich RCS Referring Phys: [phone] SUBRINA SUNDIL IMPRESSIONS  1. Technically difficult; chordal SAM; no LVOT gradient at rest.  2. Left ventricular ejection fraction, by estimation, is 55 to 60%. The left ventricle has normal function. The left ventricle has no regional wall motion abnormalities. There is severe left ventricular hypertrophy. Left ventricular diastolic parameters  are consistent with Grade I diastolic dysfunction (impaired relaxation).  3. Right ventricular systolic function is normal. The right ventricular size is normal.  4. A small pericardial effusion is present.  5. The mitral valve is normal in structure. No evidence of mitral valve regurgitation. No evidence of mitral stenosis.  6. The aortic valve is tricuspid. Aortic valve regurgitation  is not visualized. Aortic valve sclerosis is present, with no evidence of aortic valve stenosis. FINDINGS  Left Ventricle: Left ventricular ejection fraction, by estimation, is 55 to 60%. The left ventricle has normal function. The left ventricle has  no regional wall motion abnormalities. The left ventricular internal cavity size was normal in size. There is  severe left ventricular hypertrophy. Abnormal (paradoxical) septal motion, consistent with left bundle branch block. Left ventricular diastolic parameters are consistent with Grade I diastolic dysfunction (impaired relaxation). Right Ventricle: The right ventricular size is normal. Right ventricular systolic function is normal. Left Atrium: Left atrial size was normal in size. Right Atrium: Right atrial size was normal in size. Pericardium: A small pericardial effusion is present. Mitral Valve: The mitral valve is normal in structure. No evidence of mitral valve regurgitation. No evidence of mitral valve stenosis. Tricuspid Valve: The tricuspid valve is normal in structure. Tricuspid valve regurgitation is not demonstrated. No evidence of tricuspid stenosis. Aortic Valve: The aortic valve is tricuspid. Aortic valve regurgitation is not visualized. Aortic valve sclerosis is present, with no evidence of aortic valve stenosis. Aortic valve peak gradient measures 6.4 mmHg. Pulmonic Valve: The pulmonic valve was normal in structure. Pulmonic valve regurgitation is not visualized. No evidence of pulmonic stenosis. Aorta: The aortic root is normal in size and structure. Venous: The inferior vena cava was not well visualized. IAS/Shunts: The interatrial septum is aneurysmal. The interatrial septum was not well visualized. Additional Comments: Technically difficult; chordal SAM; no LVOT gradient at rest.  LEFT VENTRICLE PLAX 2D LVIDd:         4.80 cm   Diastology LVIDs:         3.60 cm   LV e' medial:    6.53 cm/s LV PW:         1.00 cm   LV E/e' medial:  8.7 LV IVS:        1.10 cm   LV e' lateral:   8.27 cm/s LVOT diam:     2.30 cm   LV E/e' lateral: 6.9 LV SV:         61 LV SV Index:   25 LVOT Area:     4.15 cm  RIGHT VENTRICLE RV S prime:     7.29 cm/s TAPSE (M-mode): 1.2 cm LEFT ATRIUM             Index         RIGHT ATRIUM           Index LA diam:        3.60 cm 1.48 cm/m   RA Area:     19.80 cm LA Vol (A2C):   36.5 ml 14.96 ml/m  RA Volume:   53.50 ml  21.93 ml/m LA Vol (A4C):   47.6 ml 19.51 ml/m LA Biplane Vol: 45.2 ml 18.53 ml/m  AORTIC VALVE AV Area (Vmax): 2.84 cm AV Vmax:        126.00 cm/s AV Peak Grad:   6.4 mmHg LVOT Vmax:      86.13 cm/s LVOT Vmean:     51.367 cm/s LVOT VTI:       0.147 m  AORTA Ao Root diam: 3.70 cm Ao Asc diam:  3.80 cm MITRAL VALVE MV Area (PHT): 2.83 cm    SHUNTS MV Decel Time: 268 msec    Systemic VTI:  0.15 m MV E velocity: 56.90 cm/s  Systemic Diam: 2.30 cm MV A velocity: 79.90 cm/s MV E/A ratio:  0.71 Olga Millers  MD Electronically signed by Olga Millers MD Signature Date/Time: 06/16/2023/12:57:18 PM    Final    CT ANGIO HEAD NECK W WO CM Result Date: 06/16/2023 CLINICAL DATA:  Acute infarcts on same-day MRI, altered mental status EXAM: CT ANGIOGRAPHY HEAD AND NECK WITH AND WITHOUT CONTRAST TECHNIQUE: Multidetector CT imaging of the head and neck was performed using the standard protocol during bolus administration of intravenous contrast. Multiplanar CT image reconstructions and MIPs were obtained to evaluate the vascular anatomy. Carotid stenosis measurements (when applicable) are obtained utilizing NASCET criteria, using the distal internal carotid diameter as the denominator. RADIATION DOSE REDUCTION: This exam was performed according to the departmental dose-optimization program which includes automated exposure control, adjustment of the mA and/or kV according to patient size and/or use of iterative reconstruction technique. CONTRAST:  75mL OMNIPAQUE IOHEXOL 350 MG/ML SOLN COMPARISON:  05/01/2023 CT head, no prior CTA available, correlation is also made with 06/15/2023 MRI head FINDINGS: CT HEAD FINDINGS Brain: No evidence of acute infarct, hemorrhage, mass, mass effect, or midline shift. No hydrocephalus or extra-axial fluid collection. The suspected acute  infarcts on the same day MRI head are not appreciated on the CT. Subacute lacunar infarct in the left thalamus, which is new from the prior head CT. Vascular: No hyperdense vessel. Skull: Negative for fracture or focal lesion. Sinuses/Orbits: Mucous retention cysts in the left maxillary sinus. No acute finding in the orbits. Other: The mastoid air cells are well aerated. CTA NECK FINDINGS Aortic arch: Standard branching. Imaged portion shows no evidence of aneurysm or dissection. No significant stenosis of the major arch vessel origins. Right carotid system: Limited visualization of the proximal right common carotid artery due to beam hardening artifact from the adjacent contrast bolus. Within this limitation, no evidence of dissection, occlusion, or hemodynamically significant stenosis (greater than 50%). Left carotid system: No evidence of dissection, occlusion, or hemodynamically significant stenosis (greater than 50%). Vertebral arteries: No evidence of dissection, occlusion, or hemodynamically significant stenosis (greater than 50%). Skeleton: No acute osseous abnormality. Degenerative changes in the cervical spine. Prior median sternotomy. Other neck: No acute finding. Upper chest: No focal pulmonary opacity or pleural effusion. Review of the MIP images confirms the above findings CTA HEAD FINDINGS Anterior circulation: Both internal carotid arteries are patent to the termini, without significant stenosis. Patent left A1. Aplastic right A1. Normal anterior communicating artery. The right A2 is dominant, with a diminutive left ACA. Anterior cerebral arteries are patent to their distal aspects without significant stenosis. No M1 stenosis or occlusion. MCA branches perfused to their distal aspects without significant stenosis. Posterior circulation: Vertebral arteries patent to the vertebrobasilar junction without significant stenosis. Posterior inferior cerebellar arteries patent proximally. Basilar patent to  its distal aspect without significant stenosis. Superior cerebellar arteries patent proximally. Patent P1 segments. PCAs perfused to their distal aspects without significant stenosis. The bilateral posterior communicating arteries are diminutive but patent. Venous sinuses: As permitted by contrast timing, patent. Anatomic variants: None significant. No evidence of aneurysm or vascular malformation. Review of the MIP images confirms the above findings IMPRESSION: 1. No intracranial large vessel occlusion or significant stenosis. 2. No hemodynamically significant stenosis in the neck. 3. Subacute lacunar infarct in the left thalamus, which is new from the prior head CT. The suspected acute infarcts on the same day MRI head are not appreciated on the CT. Electronically Signed   By: Wiliam Ke M.D.   On: 06/16/2023 01:00   DG Chest 2 View Result Date: 06/15/2023 CLINICAL DATA:  [id number] SOB (shortness of breath) [id number] EXAM: CHEST - 2 VIEW COMPARISON:  Chest x-ray 05/22/2023 FINDINGS: The heart and mediastinal contours are unchanged. Low lung volumes. No focal consolidation. No pulmonary edema. No pleural effusion. No pneumothorax. No acute osseous abnormality.  Sternotomy wires are intact. IMPRESSION: Low lung volumes with no active cardiopulmonary disease. Electronically Signed   By: Tish Frederickson M.D.   On: 06/15/2023 20:58   MR BRAIN WO CONTRAST Result Date: 06/15/2023 CLINICAL DATA:  Provided history: Mental status change, unknown cause. EXAM: MRI HEAD WITHOUT CONTRAST TECHNIQUE: Multiplanar, multiecho pulse sequences of the brain and surrounding structures were obtained without intravenous contrast. COMPARISON:  Head CT 05/01/2023. FINDINGS: The patient was unable to tolerate the full examination. Only axial and coronal diffusion-weighted sequences could be acquired. 6 mm focus of diffusion-weighted signal abnormality within the posterior left frontal lobe white matter (series 2, image 37). 4 mm focus  of diffusion-weighted signal abnormality within the right parietal lobe white matter (series 2, image 36). Subacute infarct within the left thalamus, new from the prior head CT of 05/01/2023. IMPRESSION: 1. Prematurely terminated examination consisting of diffusion-weighted sequences only. 2. Two subcentimeter foci of white matter diffusion-weighted signal abnormality, one within the posterior left frontal lobe and the other within the right parietal lobe. These are compatible with acute/subacute infarcts. 3. Subacute infarct within the left thalamus, new from the prior head CT of 05/01/2023. Electronically Signed   By: Jackey Loge D.O.   On: 06/15/2023 16:39   ECHOCARDIOGRAM LIMITED Result Date: 06/12/2023    ECHOCARDIOGRAM LIMITED REPORT   Patient Name:   Keatan Vandevoort. Date of Exam: 06/12/2023 Medical Rec #:  [id number]         Height:       72.0 in Accession #:    [id number]        Weight:       278.0 lb Date of Birth:  09/26/1965          BSA:          2.451 m Patient Age:    57 years          BP:           134/87 mmHg Patient Gender: M                 HR:           52 bpm. Exam Location:  [facility] Procedure: Limited Echo, Limited Color Doppler and Cardiac Doppler Indications:    I31.39 Pericardial Effusion  History:        Patient has prior history of Echocardiogram examinations, most                 recent 06/28/2022. HOCM, CAD; Risk Factors:Sleep Apnea and HLD.                 Cardiac Amyloidosis.  Sonographer:    Clearence Ped RCS Referring Phys: [id number] MAHESH A CHANDRASEKHAR IMPRESSIONS  1. Left ventricular ejection fraction, by estimation, is 45 to 50%. The left ventricle has mildly decreased function. The left ventricle demonstrates regional wall motion abnormalities. Abnormal (paradoxical) septal motion, consistent with left bundle branch block.     There is moderate left ventricular hypertrophy.  2. Right ventricular systolic function is normal. The right ventricular size is normal.  3.  Moderate pericardial effusion. There is no evidence of cardiac tamponade.  4. The mitral valve is normal in structure. Trivial mitral valve regurgitation.  5. The  aortic valve is tricuspid. Aortic valve regurgitation is trivial. No aortic stenosis is present. FINDINGS  Left Ventricle: Left ventricular ejection fraction, by estimation, is 45 to 50%. The left ventricle has mildly decreased function. The left ventricle demonstrates regional wall motion abnormalities. The left ventricular internal cavity size was normal in size. There is moderate left ventricular hypertrophy. Abnormal (paradoxical) septal motion, consistent with left bundle branch block. Right Ventricle: The right ventricular size is normal. No increase in right ventricular wall thickness. Right ventricular systolic function is normal. Pericardium: A moderately sized pericardial effusion is present. There is no evidence of cardiac tamponade. Mitral Valve: The mitral valve is normal in structure. Trivial mitral valve regurgitation. Aortic Valve: The aortic valve is tricuspid. Aortic valve regurgitation is trivial. No aortic stenosis is present. Pulmonic Valve: The pulmonic valve was not well visualized. Pulmonic valve regurgitation is not visualized. Aorta: The aortic root and ascending aorta are structurally normal, with no evidence of dilitation. LEFT VENTRICLE PLAX 2D LVIDd:         5.10 cm   Diastology LVIDs:         3.80 cm   LV e' medial:    2.83 cm/s LV PW:         1.50 cm   LV E/e' medial:  21.5 LV IVS:        1.40 cm   LV e' lateral:   7.94 cm/s LVOT diam:     2.20 cm   LV E/e' lateral: 7.7 LV SV:         103 LV SV Index:   42 LVOT Area:     3.80 cm  LEFT ATRIUM         Index LA diam:    4.70 cm 1.92 cm/m  AORTIC VALVE LVOT Vmax:   125.00 cm/s LVOT Vmean:  87.900 cm/s LVOT VTI:    0.271 m  AORTA Ao Root diam: 3.90 cm Ao Asc diam:  3.60 cm MITRAL VALVE MV Area (PHT):             SHUNTS MV Decel Time:             Systemic VTI:  0.27 m MV E  velocity: 60.80 cm/s  Systemic Diam: 2.20 cm MV A velocity: 81.20 cm/s MV E/A ratio:  0.75 Epifanio Lesches MD Electronically signed by Epifanio Lesches MD Signature Date/Time: 06/12/2023/5:31:22 PM    Final    PYP Scan Result Date: 06/09/2023   Myocardial uptake was negative for radiotracer uptake. The visual grade of myocardial uptake relative to the ribs was Grade 0 (No myocardial uptake and normal bone uptake).   Findings are not suggestive (Grade 0) of cardiac ATTR amyloidosis.   Prior study not available for comparison.   H/CL ratio < 1.5 measuring 1.37 with background correction   CT Angio Chest PE W/Cm &/Or Wo Cm Result Date: 05/22/2023 CLINICAL DATA:  Shortness of breath.  Concern for pulmonary embolus. EXAM: CT ANGIOGRAPHY CHEST WITH CONTRAST TECHNIQUE: Multidetector CT imaging of the chest was performed using the standard protocol during bolus administration of intravenous contrast. Multiplanar CT image reconstructions and MIPs were obtained to evaluate the vascular anatomy. RADIATION DOSE REDUCTION: This exam was performed according to the departmental dose-optimization program which includes automated exposure control, adjustment of the mA and/or kV according to patient size and/or use of iterative reconstruction technique. CONTRAST:  75mL OMNIPAQUE IOHEXOL 350 MG/ML SOLN COMPARISON:  Chest radiograph dated 05/22/2023. FINDINGS: Evaluation of this exam is limited due to  respiratory motion. A cardiovascular: There is mild cardiomegaly. Trace pericardial effusion. There is 3 vessel coronary vascular calcification. There is median sternotomy wires and postsurgical changes of CABG. Small retrosternal fluid, likely postsurgical. No drainable fluid collection. Mild atherosclerotic calcification of the thoracic aorta. No aneurysmal dilatation. Evaluation of the pulmonary arteries is limited due to respiratory motion and suboptimal visualization of the peripheral branches. No pulmonary artery  embolus identified. Mediastinum/Nodes: No hilar or mediastinal adenopathy. The esophagus is grossly unremarkable. Lungs/Pleura: Small bilateral pleural effusions. There is bibasilar subsegmental atelectasis. Pneumonia is not excluded. No pneumothorax. The central airways are patent. Upper Abdomen: No acute findings.  Right adrenal adenoma. Musculoskeletal: Degenerative changes of the spine. Median sternotomy wires. No acute osseous pathology. Review of the MIP images confirms the above findings. IMPRESSION: 1. No CT evidence of pulmonary artery embolus. 2. Small bilateral pleural effusions with bibasilar subsegmental atelectasis. 3.  Aortic Atherosclerosis (ICD10-I70.0). Electronically Signed   By: Elgie Collard M.D.   On: 05/22/2023 21:37   DG Chest 2 View Result Date: 05/22/2023 CLINICAL DATA:  Shortness of breath. EXAM: CHEST - 2 VIEW COMPARISON:  02/07/2014 FINDINGS: Lateral view degraded by patient arm position. Apical lordotic frontal plain film. Moderate cardiomegaly. Prior median sternotomy. Mild right hemidiaphragm elevation. Possible tiny bilateral pleural effusions. Left-greater-than-right base airspace disease. IMPRESSION: Cardiomegaly without congestive failure. Left-greater-than-right base airspace disease. Most likely atelectasis. At the left lung base, infection cannot be excluded. Electronically Signed   By: Jeronimo Greaves M.D.   On: 05/22/2023 18:39     TODAY-DAY OF DISCHARGE:  Subjective:   Joseph [name] today has no headache,no chest abdominal pain,no new weakness tingling or numbness, feels much better wants to go home today.   Objective:   Blood pressure (!) 141/83, pulse 74, temperature 98.2 F (36.8 C), temperature source Oral, resp. rate 18, height 6' (1.829 m), weight 122.9 kg, SpO2 94%.  Intake/Output Summary (Last 24 hours) at 06/19/2023 1008 Last data filed at 06/18/2023 2133 Gross per 24 hour  Intake 603 ml  Output --  Net 603 ml   Filed Weights   06/18/23 0800  06/19/23 0811  Weight: 122.9 kg 122.9 kg    Exam: Awake Alert, Oriented *3, No new F.N deficits, Normal affect [place].AT,PERRAL Supple Neck,No JVD, No cervical lymphadenopathy appriciated.  Symmetrical Chest wall movement, Good air movement bilaterally, CTAB RRR,No Gallops,Rubs or new Murmurs, No Parasternal Heave +ve B.Sounds, Abd Soft, Non tender, No organomegaly appriciated, No rebound -guarding or rigidity. No Cyanosis, Clubbing or edema, No new Rash or bruise   PERTINENT RADIOLOGIC STUDIES: No results found.   PERTINENT LAB RESULTS: CBC: Recent Labs    06/18/23 0508 06/19/23 0636  WBC 7.8 7.8  HGB 13.7 14.3  HCT 40.9 43.1  PLT 191 188   CMET CMP     Component Value Date/Time   NA 145 06/19/2023 0636   NA 143 08/08/2022 1135   K 4.0 06/19/2023 0636   CL 102 06/19/2023 0636   CO2 33 (H) 06/19/2023 0636   GLUCOSE 101 (H) 06/19/2023 0636   BUN 11 06/19/2023 0636   BUN 20 03/31/2023 0000   CREATININE 0.88 06/19/2023 0636   CALCIUM 8.0 (L) 06/19/2023 0636   PROT 4.3 (L) 06/16/2023 0611   PROT 4.7 (L) 06/01/2023 1020   ALBUMIN 1.6 (L) 06/16/2023 0611   ALBUMIN 3.8 06/28/2022 0958   AST 36 06/16/2023 0611   ALT 29 06/16/2023 0611   ALKPHOS 82 06/16/2023 0611   BILITOT 0.6 06/16/2023 2725  BILITOT 0.4 06/28/2022 0958   EGFR 99.3 03/31/2023 0000   EGFR 79 08/08/2022 1135   GFRNONAA >60 06/19/2023 0636    GFR Estimated Creatinine Clearance: 122.3 mL/min (by C-G formula based on SCr of 0.88 mg/dL). No results for input(s): "LIPASE", "AMYLASE" in the last 72 hours. No results for input(s): "CKTOTAL", "CKMB", "CKMBINDEX", "TROPONINI" in the last 72 hours. Invalid input(s): "POCBNP" No results for input(s): "DDIMER" in the last 72 hours. No results for input(s): "HGBA1C" in the last 72 hours. No results for input(s): "CHOL", "HDL", "LDLCALC", "TRIG", "CHOLHDL", "LDLDIRECT" in the last 72 hours. No results for input(s): "TSH", "T4TOTAL", "T3FREE", "THYROIDAB" in the  last 72 hours.  Invalid input(s): "FREET3" No results for input(s): "VITAMINB12", "FOLATE", "FERRITIN", "TIBC", "IRON", "RETICCTPCT" in the last 72 hours. Coags: No results for input(s): "INR" in the last 72 hours.  Invalid input(s): "PT" Microbiology: No results found for this or any previous visit (from the past 240 hours).  FURTHER DISCHARGE INSTRUCTIONS:  Get Medicines reviewed and adjusted: Please take all your medications with you for your next visit with your Primary MD  Laboratory/radiological data: Please request your Primary MD to go over all hospital tests and procedure/radiological results at the follow up, please ask your Primary MD to get all Hospital records sent to his/her office.  In some cases, they will be blood work, cultures and biopsy results pending at the time of your discharge. Please request that your primary care M.D. goes through all the records of your hospital data and follows up on these results.  Also Note the following: If you experience worsening of your admission symptoms, develop shortness of breath, life threatening emergency, suicidal or homicidal thoughts you must seek medical attention immediately by calling 911 or calling your MD immediately  if symptoms less severe.  You must read complete instructions/literature along with all the possible adverse reactions/side effects for all the Medicines you take and that have been prescribed to you. Take any new Medicines after you have completely understood and accpet all the possible adverse reactions/side effects.   Do not drive when taking Pain medications or sleeping medications (Benzodaizepines)  Do not take more than prescribed Pain, Sleep and Anxiety Medications. It is not advisable to combine anxiety,sleep and pain medications without talking with your primary care practitioner  Special Instructions: If you have smoked or chewed Tobacco  in the last 2 yrs please stop smoking, stop any regular  Alcohol  and or any Recreational drug use.  Wear Seat belts while driving.  Please note: You were cared for by a hospitalist during your hospital stay. Once you are discharged, your primary care physician will handle any further medical issues. Please note that NO REFILLS for any discharge medications will be authorized once you are discharged, as it is imperative that you return to your primary care physician (or establish a relationship with a primary care physician if you do not have one) for your post hospital discharge needs so that they can reassess your need for medications and monitor your lab values.  Total Time spent coordinating discharge including counseling, education and face to face time equals greater than 30 minutes.  Signed: Regana Kemple 06/19/2023 10:08 AM

## 2023-06-19 NOTE — Progress Notes (Signed)
Discharge education/instructions provided by virtual nurse Joni Reining, RN  VSS. Respirations are even and unlabored. NAD. RA. Patient denies CP or SOB.  AVS and all personal belongings sent with patient.

## 2023-06-23 ENCOUNTER — Encounter (HOSPITAL_COMMUNITY): Payer: Self-pay

## 2023-06-26 ENCOUNTER — Ambulatory Visit: Payer: Medicare Other | Admitting: Urology

## 2023-06-26 ENCOUNTER — Ambulatory Visit (HOSPITAL_BASED_OUTPATIENT_CLINIC_OR_DEPARTMENT_OTHER): Payer: Medicare Other | Admitting: Pulmonary Disease

## 2023-06-26 VITALS — BP 70/45 | HR 73 | Ht 72.0 in | Wt 275.0 lb

## 2023-06-26 DIAGNOSIS — N200 Calculus of kidney: Secondary | ICD-10-CM | POA: Diagnosis not present

## 2023-06-26 DIAGNOSIS — N400 Enlarged prostate without lower urinary tract symptoms: Secondary | ICD-10-CM

## 2023-06-26 DIAGNOSIS — N401 Enlarged prostate with lower urinary tract symptoms: Secondary | ICD-10-CM
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
## 2023-06-27 ENCOUNTER — Encounter (HOSPITAL_COMMUNITY)
Admission: RE | Admit: 2023-06-27 | Discharge: 2023-06-27 | Disposition: A | Discharge status: Home / Self Care | Payer: Medicare Other | Source: Ambulatory Visit | Attending: Internal Medicine | Admitting: Internal Medicine

## 2023-06-27 VITALS — Ht 71.75 in | Wt 271.9 lb

## 2023-06-27 DIAGNOSIS — Z9889 Other specified postprocedural states: Secondary | ICD-10-CM | POA: Diagnosis not present

## 2023-06-27 DIAGNOSIS — Z951 Presence of aortocoronary bypass graft: Secondary | ICD-10-CM | POA: Diagnosis not present

## 2023-06-27 NOTE — Progress Notes (Signed)
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 cessation.;Monitor prescription use compliance.    Expected Outcomes Short Term: Continued assessment and intervention until BP is < 140/75mm HG in hypertensive participants. < 130/50mm HG in hypertensive participants with diabetes, heart failure or chronic kidney disease.;Long Term: Maintenance of blood pressure at goal levels.    Lipids Yes    Intervention Provide education and support for participant on nutrition & aerobic/resistive exercise along with prescribed medications to achieve LDL 70mg , HDL >40mg .    Expected Outcomes Short Term: Participant states understanding of desired cholesterol values and is compliant with medications prescribed. Participant is following exercise prescription and nutrition guidelines.;Long Term: Cholesterol controlled with medications as prescribed, with  individualized exercise RX and with personalized nutrition plan. Value goals: LDL < 70mg , HDL > 40 mg.             Core Components/Risk Factors/Patient Goals Review:    Core Components/Risk Factors/Patient Goals at Discharge (Final Review):    ITP Comments:  ITP Comments     Row Name 06/27/23 1520           ITP Comments Patient attend orientation today.  Patient is attending Cardiac Rehabilitation Program.  Documentation for diagnosis can be  found in [facility] Admission 05/11/23.  Reviewed medical chart, RPE/RPD, gym safety, and program guidelines.  Patient was fitted to equipment they will be using during rehab.  Patient is scheduled to start exercise on Friday 06/30/23 at 915.   Initial ITP created and sent for review and signature by Dr. Dorn Ross, Medical Director for Cardiac Rehabilitation Program.                Comments: Initial ITP

## 2023-06-27 NOTE — Patient Instructions (Signed)
 Patient Instructions  Patient Details  Name: Joseph [name]. MRN: [id number] Date of Birth: 18-Oct-1965 Referring Provider:  Santo Kelly A*  Below are your personal goals for exercise, nutrition, and risk factors. Our goal is to help you stay on track towards obtaining and maintaining these goals. We will be discussing your progress on these goals with you throughout the program.  Initial Exercise Prescription:  Initial Exercise Prescription - 06/27/23 1500       Date of Initial Exercise RX and Referring Provider   Date 06/27/23    Referring Provider [name] MD      Oxygen   Maintain Oxygen Saturation 88% or higher      Treadmill   MPH 1.5    Grade 0.5    Minutes 15    METs 2.25      NuStep   Level 3    SPM 80    Minutes 15    METs 2      Prescription Details   Frequency (times per week) 3    Duration Progress to 30 minutes of continuous aerobic without signs/symptoms of physical distress      Intensity   THRR 40-80% of Max Heartrate 106-142    Ratings of Perceived Exertion 11-13    Perceived Dyspnea 0-4      Progression   Progression Continue to progress workloads to maintain intensity without signs/symptoms of physical distress.      Resistance Training   Training Prescription Yes    Weight 3 lb    Reps 10-15             Exercise Goals: Frequency: Be able to perform aerobic exercise two to three times per week in program working toward 2-5 days per week of home exercise.  Intensity: Work with a perceived exertion of 11 (fairly light) - 15 (hard) while following your exercise prescription.  We will make changes to your prescription with you as you progress through the program.   Duration: Be able to do 30 to 45 minutes of continuous aerobic exercise in addition to a 5 minute warm-up and a 5 minute cool-down routine.   Nutrition Goals: Your personal nutrition goals will be established when you do your nutrition analysis with the  dietician.  The following are general nutrition guidelines to follow: Cholesterol < 200mg /day Sodium < 1500mg /day Fiber: Men over 50 yrs - 30 grams per day  Personal Goals:  Personal Goals and Risk Factors at Admission - 06/27/23 1545       Core Components/Risk Factors/Patient Goals on Admission    Weight Management Yes;Obesity;Weight Loss    Intervention Weight Management: Develop a combined nutrition and exercise program designed to reach desired caloric intake, while maintaining appropriate intake of nutrient and fiber, sodium and fats, and appropriate energy expenditure required for the weight goal.;Weight Management: Provide education and appropriate resources to help participant work on and attain dietary goals.;Weight Management/Obesity: Establish reasonable short term and long term weight goals.;Obesity: Provide education and appropriate resources to help participant work on and attain dietary goals.    Admit Weight 271 lb 14.4 oz (123.3 kg)    Goal Weight: Short Term 265 lb (120.2 kg)    Goal Weight: Long Term 259 lb (117.5 kg)    Expected Outcomes Short Term: Continue to assess and modify interventions until short term weight is achieved;Long Term: Adherence to nutrition and physical activity/exercise program aimed toward attainment of established weight goal;Weight Loss: Understanding of general recommendations for a balanced  deficit meal plan, which promotes 1-2 lb weight loss per week and includes a negative energy balance of [phone] kcal/d;Understanding recommendations for meals to include 15-35% energy as protein, 25-35% energy from fat, 35-60% energy from carbohydrates, less than 200mg  of dietary cholesterol, 20-35 gm of total fiber daily;Understanding of distribution of calorie intake throughout the day with the consumption of 4-5 meals/snacks    Hypertension Yes    Intervention Provide education on lifestyle modifcations including regular physical activity/exercise, weight  management, moderate sodium restriction and increased consumption of fresh fruit, vegetables, and low fat dairy, alcohol moderation, and smoking cessation.;Monitor prescription use compliance.    Expected Outcomes Short Term: Continued assessment and intervention until BP is < 140/69mm HG in hypertensive participants. < 130/53mm HG in hypertensive participants with diabetes, heart failure or chronic kidney disease.;Long Term: Maintenance of blood pressure at goal levels.    Lipids Yes    Intervention Provide education and support for participant on nutrition & aerobic/resistive exercise along with prescribed medications to achieve LDL 70mg , HDL >40mg .    Expected Outcomes Short Term: Participant states understanding of desired cholesterol values and is compliant with medications prescribed. Participant is following exercise prescription and nutrition guidelines.;Long Term: Cholesterol controlled with medications as prescribed, with individualized exercise RX and with personalized nutrition plan. Value goals: LDL < 70mg , HDL > 40 mg.             Tobacco Use Initial Evaluation: Social History   Tobacco Use  Smoking Status Former   Current packs/day: 0.00   Average packs/day: 1.5 packs/day for 15.0 years (22.5 ttl pk-yrs)   Types: Cigarettes   Start date: 04/20/1988   Quit date: 04/21/2003   Years since quitting: 20.1  Smokeless Tobacco Never  Tobacco Comments   Pt states that he chews on cigars.    Exercise Goals and Review:  Exercise Goals     Row Name 06/27/23 1535             Exercise Goals   Increase Physical Activity Yes       Intervention Provide advice, education, support and counseling about physical activity/exercise needs.;Develop an individualized exercise prescription for aerobic and resistive training based on initial evaluation findings, risk stratification, comorbidities and participant's personal goals.       Expected Outcomes Short Term: Attend rehab on a regular  basis to increase amount of physical activity.;Long Term: Add in home exercise to make exercise part of routine and to increase amount of physical activity.;Long Term: Exercising regularly at least 3-5 days a week.       Increase Strength and Stamina Yes       Intervention Provide advice, education, support and counseling about physical activity/exercise needs.;Develop an individualized exercise prescription for aerobic and resistive training based on initial evaluation findings, risk stratification, comorbidities and participant's personal goals.       Expected Outcomes Short Term: Increase workloads from initial exercise prescription for resistance, speed, and METs.;Short Term: Perform resistance training exercises routinely during rehab and add in resistance training at home;Long Term: Improve cardiorespiratory fitness, muscular endurance and strength as measured by increased METs and functional capacity ( )       Able to understand and use rate of perceived exertion (RPE) scale Yes       Intervention Provide education and explanation on how to use RPE scale       Expected Outcomes Short Term: Able to use RPE daily in rehab to express subjective intensity level;Long Term:  Able to use  RPE to guide intensity level when exercising independently       Able to understand and use Dyspnea scale Yes       Intervention Provide education and explanation on how to use Dyspnea scale       Expected Outcomes Short Term: Able to use Dyspnea scale daily in rehab to express subjective sense of shortness of breath during exertion;Long Term: Able to use Dyspnea scale to guide intensity level when exercising independently       Knowledge and understanding of Target Heart Rate Range (THRR) Yes       Intervention Provide education and explanation of THRR including how the numbers were predicted and where they are located for reference       Expected Outcomes Short Term: Able to state/look up THRR;Long Term: Able to use  THRR to govern intensity when exercising independently;Short Term: Able to use daily as guideline for intensity in rehab       Able to check pulse independently Yes       Intervention Review the importance of being able to check your own pulse for safety during independent exercise;Provide education and demonstration on how to check pulse in carotid and radial arteries.       Expected Outcomes Short Term: Able to explain why pulse checking is important during independent exercise;Long Term: Able to check pulse independently and accurately       Understanding of Exercise Prescription Yes       Intervention Provide education, explanation, and written materials on patient's individual exercise prescription       Expected Outcomes Short Term: Able to explain program exercise prescription;Long Term: Able to explain home exercise prescription to exercise independently              Copy of goals given to participant.

## 2023-06-30 ENCOUNTER — Encounter (HOSPITAL_COMMUNITY)
Admission: RE | Admit: 2023-06-30 | Discharge: 2023-06-30 | Disposition: A | Discharge status: Home / Self Care | Payer: Medicare Other | Source: Ambulatory Visit | Attending: Cardiovascular Disease

## 2023-06-30 DIAGNOSIS — Z9889 Other specified postprocedural states: Secondary | ICD-10-CM | POA: Diagnosis not present

## 2023-06-30 DIAGNOSIS — Z951 Presence of aortocoronary bypass graft: Secondary | ICD-10-CM | POA: Diagnosis not present

## 2023-06-30 NOTE — Progress Notes (Signed)
 Daily Session Note  Patient Details  Name: Joseph [name]. MRN: [id number] Date of Birth: Jan 05, 1966 Referring Provider:   Flowsheet Row CARDIAC REHAB PHASE II ORIENTATION from 06/27/2023 in [facility] CARDIAC REHABILITATION  Referring Provider [name] MD       Encounter Date: 06/30/2023  Check In:  Session Check In - 06/30/23 0925       Check-In   Supervising physician immediately available to respond to emergencies See telemetry face sheet for immediately available MD    Location [facility]    Staff Present Powell Benders, BS, Exercise Physiologist;Kimber Fritts Vonzell, MA, RCEP, CCRP, Sueellen Louder, RN, BSN    Virtual Visit No    Medication changes reported     No    Fall or balance concerns reported    No    Warm-up and Cool-down Performed on first and last piece of equipment    Resistance Training Performed Yes    VAD Patient? No    PAD/SET Patient? No      Pain Assessment   Currently in Pain? No/denies             Capillary Blood Glucose: No results found for this or any previous visit (from the past 24 hours).    Social History   Tobacco Use  Smoking Status Former   Current packs/day: 0.00   Average packs/day: 1.5 packs/day for 15.0 years (22.5 ttl pk-yrs)   Types: Cigarettes   Start date: 04/20/1988   Quit date: 04/21/2003   Years since quitting: 20.2  Smokeless Tobacco Never  Tobacco Comments   Pt states that he chews on cigars.    Goals Met:  Exercise tolerated well No report of concerns or symptoms today Strength training completed today  Goals Unmet:  Not Applicable  Comments: First full day of exercise!  Patient was oriented to gym and equipment including functions, settings, policies, and procedures.  Patient's individual exercise prescription and treatment plan were reviewed.  All starting workloads were established based on the results of the 6 minute walk test done at initial orientation visit.  The plan for  exercise progression was also introduced and progression will be customized based on patient's performance and goals.

## 2023-07-01 NOTE — Telephone Encounter (Signed)
 Dr. Vassie Loll, please see mychart messages sent. Your next opening is not until March. Please advise if we can work pt into your schedule sooner than that.

## 2023-07-02 ENCOUNTER — Encounter: Payer: Self-pay | Admitting: Urology

## 2023-07-03 ENCOUNTER — Encounter (HOSPITAL_COMMUNITY): Payer: Medicare Other

## 2023-07-05 ENCOUNTER — Encounter (HOSPITAL_COMMUNITY)
Admission: RE | Admit: 2023-07-05 | Discharge: 2023-07-05 | Disposition: A | Discharge status: Home / Self Care | Payer: Medicare Other | Source: Ambulatory Visit | Attending: Cardiovascular Disease | Admitting: Cardiovascular Disease

## 2023-07-05 DIAGNOSIS — Z951 Presence of aortocoronary bypass graft: Secondary | ICD-10-CM

## 2023-07-05 DIAGNOSIS — Z9889 Other specified postprocedural states: Secondary | ICD-10-CM | POA: Diagnosis not present

## 2023-07-05 NOTE — Progress Notes (Signed)
 Daily Session Note  Patient Details  Name: Joseph [name]. MRN: [id number] Date of Birth: 05/04/1966 Referring Provider:   Flowsheet Row CARDIAC REHAB PHASE II ORIENTATION from 06/27/2023 in [facility] CARDIAC REHABILITATION  Referring Provider Arnoldo Lapping MD       Encounter Date: 07/05/2023  Check In:  Session Check In - 07/05/23 0915       Check-In   Supervising physician immediately available to respond to emergencies See telemetry face sheet for immediately available MD    Location [facility]    Staff Present Clotilda Danish, BS, Exercise Physiologist;Jessica Zoila Hines, MA, RCEP, CCRP, CCET    Virtual Visit No    Medication changes reported     No    Fall or balance concerns reported    No    Tobacco Cessation No Change    Warm-up and Cool-down Performed on first and last piece of equipment    Resistance Training Performed Yes    VAD Patient? No    PAD/SET Patient? No      Pain Assessment   Currently in Pain? No/denies    Pain Score 0-No pain    Multiple Pain Sites No             Capillary Blood Glucose: No results found for this or any previous visit (from the past 24 hours).    Social History   Tobacco Use  Smoking Status Former   Current packs/day: 0.00   Average packs/day: 1.5 packs/day for 15.0 years (22.5 ttl pk-yrs)   Types: Cigarettes   Start date: 04/20/1988   Quit date: 04/21/2003   Years since quitting: 20.2  Smokeless Tobacco Never  Tobacco Comments   Pt states that he chews on cigars.    Goals Met:  Independence with exercise equipment Exercise tolerated well No report of concerns or symptoms today Strength training completed today  Goals Unmet:  Not Applicable  Comments: Pt able to follow exercise prescription today without complaint.  Will continue to monitor for progression.

## 2023-07-07 ENCOUNTER — Encounter: Payer: Self-pay | Admitting: Physician Assistant

## 2023-07-07 ENCOUNTER — Encounter (HOSPITAL_COMMUNITY)
Admission: RE | Admit: 2023-07-07 | Discharge: 2023-07-07 | Disposition: A | Discharge status: Home / Self Care | Payer: Medicare Other | Source: Ambulatory Visit | Attending: Cardiovascular Disease

## 2023-07-07 ENCOUNTER — Ambulatory Visit: Payer: Medicare Other | Attending: Physician Assistant | Admitting: Physician Assistant

## 2023-07-07 VITALS — BP 108/68 | HR 65 | Ht 71.0 in | Wt 274.0 lb

## 2023-07-07 DIAGNOSIS — Z951 Presence of aortocoronary bypass graft: Secondary | ICD-10-CM

## 2023-07-07 DIAGNOSIS — I421 Obstructive hypertrophic cardiomyopathy: Secondary | ICD-10-CM | POA: Diagnosis not present

## 2023-07-07 DIAGNOSIS — I3139 Other pericardial effusion (noninflammatory): Secondary | ICD-10-CM

## 2023-07-07 DIAGNOSIS — E854 Organ-limited amyloidosis: Secondary | ICD-10-CM

## 2023-07-07 DIAGNOSIS — Z9889 Other specified postprocedural states: Secondary | ICD-10-CM | POA: Diagnosis not present

## 2023-07-07 DIAGNOSIS — I639 Cerebral infarction, unspecified: Secondary | ICD-10-CM

## 2023-07-07 DIAGNOSIS — I43 Cardiomyopathy in diseases classified elsewhere: Secondary | ICD-10-CM | POA: Diagnosis not present

## 2023-07-07 DIAGNOSIS — R002 Palpitations: Secondary | ICD-10-CM

## 2023-07-07 DIAGNOSIS — R7981 Abnormal blood-gas level: Secondary | ICD-10-CM

## 2023-07-07 MED ORDER — FUROSEMIDE 20 MG PO TABS: ORAL_TABLET | ORAL | 3 refills | Status: DC

## 2023-07-07 MED ORDER — EZETIMIBE 10 MG PO TABS: 10.0000 mg | ORAL_TABLET | Freq: Every day | ORAL | 3 refills | Status: AC

## 2023-07-07 MED ORDER — CLOPIDOGREL BISULFATE 75 MG PO TABS: 75.0000 mg | ORAL_TABLET | Freq: Every day | ORAL | 3 refills | Status: DC

## 2023-07-07 MED ORDER — METOPROLOL TARTRATE 25 MG PO TABS: 12.5000 mg | ORAL_TABLET | Freq: Two times a day (BID) | ORAL | 3 refills | Status: DC

## 2023-07-07 NOTE — Progress Notes (Signed)
Daily Session Note  Patient Details  Name: Joseph [name]. MRN: [id number] Date of Birth: [birth year] Referring Provider:   Flowsheet Row CARDIAC REHAB PHASE II ORIENTATION from 06/27/2023 in [facility] CARDIAC REHABILITATION  Referring Provider Tonny Bollman MD       Encounter Date: 07/07/2023  Check In:  Session Check In - 07/07/23 0915       Check-In   Supervising physician immediately available to respond to emergencies See telemetry face sheet for immediately available MD    Location [facility]    Staff Present Ross Ludwig, BS, Exercise Physiologist;Jessica Juanetta Gosling, MA, RCEP, CCRP, CCET    Virtual Visit No    Medication changes reported     No    Fall or balance concerns reported    No    Tobacco Cessation No Change    Warm-up and Cool-down Performed on first and last piece of equipment    Resistance Training Performed Yes    VAD Patient? No    PAD/SET Patient? No      Pain Assessment   Currently in Pain? No/denies    Pain Score 0-No pain    Multiple Pain Sites No             Capillary Blood Glucose: No results found for this or any previous visit (from the past 24 hours).    Social History   Tobacco Use  Smoking Status Former   Current packs/day: 0.00   Average packs/day: 1.5 packs/day for 15.0 years (22.5 ttl pk-yrs)   Types: Cigarettes   Start date: 04/20/1988   Quit date: 04/21/2003   Years since quitting: 20.2  Smokeless Tobacco Never  Tobacco Comments   Pt states that he chews on cigars.    Goals Met:  Independence with exercise equipment Exercise tolerated well No report of concerns or symptoms today Strength training completed today  Goals Unmet:  Not Applicable  Comments: Pt able to follow exercise prescription today without complaint.  Will continue to monitor for progression.

## 2023-07-07 NOTE — Patient Instructions (Signed)
Medication Instructions:   STOP METOPROLOL SUCC - CURRENT METOPROLOL  START METOPROLOL TARTRATE 12.5 MG TWICE DAILY= 1/2 OF THE 25 MG TABLET TWICE DAILY  MAY TAKE EXTRA 20 MG OF FUROSEMIDE ONCE DAILY AS NEEDED FOR SWELLING OR WEIGHT GAIN  *If you need a refill on your cardiac medications before your next appointment, please call your pharmacy*   Testing/Procedures:  Your physician has requested that you have an echocardiogram. Echocardiography is a painless test that uses sound waves to create images of your heart. It provides your doctor with information about the size and shape of your heart and how well your heart's chambers and valves are working. This procedure takes approximately one hour. There are no restrictions for this procedure. Please do NOT wear cologne, perfume, aftershave, or lotions (deodorant is allowed). Please arrive 15 minutes prior to your appointment time.  Please note: We ask at that you not bring children with you during ultrasound (echo/ vascular) testing. Due to room size and safety concerns, children are not allowed in the ultrasound rooms during exams. Our front office staff cannot provide observation of children in our lobby area while testing is being conducted. An adult accompanying a patient to their appointment will only be allowed in the ultrasound room at the discretion of the ultrasound technician under special circumstances. We apologize for any inconvenience. [address]- SCHEDULE PRIOR TO FOLLOW UP APPT 08/22/23  Follow-Up: At [facility], you and your health needs are our priority.  As part of our continuing mission to provide you with exceptional heart care, we have created designated Provider Care Teams.  These Care Teams include your primary Cardiologist (physician) and Advanced Practice Providers (APPs -  Physician Assistants and Nurse Practitioners) who all work together to provide you with the care you need, when you need  it.  We recommend signing up for the patient portal called "MyChart".  Sign up information is provided on this After Visit Summary.  MyChart is used to connect with patients for Virtual Visits (Telemedicine).  Patients are able to view lab/test results, encounter notes, upcoming appointments, etc.  Non-urgent messages can be sent to your provider as well.   To learn more about what you can do with MyChart, go to [email or web address].    Your next appointment:   AS SCHEDULED  Other Instructions  WEIGH ONCE DAILY IN THE MORNING AFTER GOING TO THE BATHROOM AND WRITE IT DOWN-TAKE EXTRA FUROSEMIDE FOR WEIGHT GAIN OF 2-3 LBS OVERNIGHT OR 5 LBS IN ONE WEEK  WEAR COMPRESSION HOSE DURING THE DAY AND REMOVE AT BEDTIME DAILY

## 2023-07-07 NOTE — Progress Notes (Signed)
Cardiology Office Note:  .   Date:  07/07/2023  ID:  Joseph Barter., DOB [birth year], MRN [id number] PCP: Richardean Chimera, MD  [facility] Providers Cardiologist:  Tonny Bollman, MD {  History of Present Illness: .   Joseph Vargaz. is a 58 y.o. male with a past medical history of CAD with prior PCI since 2011, chronic back pain, HCM with severe LVOT gradient here for follow-up appointment.  At last evaluation was found to have severe LVOT gradient given his NYHA III symptoms a LHC was performed 2024.  While spine, no significant LVOT obstruction.  Given his symptoms he was referred to HCM clinic for evaluation of cardiac myosin inhibitors.  He tried CMI therapy for 1 week back in 2024 and he felt it was affecting his oxycodone.  He had called in for low blood pressures.  In the summer he had a near syncopal episode.  No NSVT on heart monitor.  Had myectomy 04/2023.  He presented back in December and underwent open heart surgery and myectomy at [facility] clinic.  Postoperatively, experienced hypoxic respiratory failure and atrial fibrillation which is managed by amiodarone.  Also had suspected TIA during hospital stay and stroke ruled out with negative CT scan and EEG.  The patient is atrial fibrillation was managed by course of amiodarone with 2 weeks remaining.  The patient has been experiencing panic attacks and CBT was discussed.  They are getting more frequent.  Using a fan now and is greatly improved.  Patient also reports memory lapses that have resolved.  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  ATTR gene negative.  PCP negative.  IFE negative.  Has protenuria.   Today, he presents with a history of hypertrophic cardiomyopathy, coronary bypass surgery, and recent stroke, here for a follow-up  visit after hospitalization. The patient had his first stroke at the end of November, which was initially classified as a TIA. However, a subsequent stroke on December 30th was classified as a full CVA. MRI showed small infarcts on both sides of the brain. The patient was prescribed aspirin and Plavix for three weeks, after which the aspirin was to be discontinued and Plavix continued indefinitely.  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 He takes oxycodone four times a day, sometimes one at night. The patient's wife is concerned about the interaction between oxycodone and Plavix, as she read that oxycodone might increase the metabolism of Plavix.   The patient also has a history of panic attacks, which have improved significantly. He has been taking a sleep aid and Tylenol PM, which have helped him sleep better. However, he still experiences some nights where he has difficulty falling asleep.  The patient's blood pressure has been a concern. It was high after his heart surgery, but has since been managed with metoprolol. The patient's wife has been monitoring his blood pressure at home.  The patient has been participating in cardiac therapy three times a week since January 10th. He has been approved for thirty sessions through February.  The patient's wife reports that he has been experiencing some brain fog since his strokes. He has no physical limitations from the strokes, but sometimes struggles with tasks  he used to do easily.  The patient has been experiencing some swelling in his legs, particularly on the right side, since his surgery. His weight increased to 291 lbs after his surgery, but has since decreased to 274 lbs. He was prescribed Lasix to manage the fluid overload.  Reports no shortness of breath nor dyspnea on exertion. Reports no chest pain, pressure, or tightness. No edema, orthopnea, PND.  Reports no palpitations.   Discussed the use of AI scribe software for clinical note transcription with the patient, who gave verbal consent to proceed.  ROS: pertinent ROS in HPI   Studies Reviewed: .       Echo 06/16/23 IMPRESSIONS     1. Technically difficult; chordal SAM; no LVOT gradient at rest.   2. Left ventricular ejection fraction, by estimation, is 55 to 60%. The  left ventricle has normal function. The left ventricle has no regional  wall motion abnormalities. There is severe left ventricular hypertrophy.  Left ventricular diastolic parameters   are consistent with Grade I diastolic dysfunction (impaired relaxation).   3. Right ventricular systolic function is normal. The right ventricular  size is normal.   4. A small pericardial effusion is present.   5. The mitral valve is normal in structure. No evidence of mitral valve  regurgitation. No evidence of mitral stenosis.   6. The aortic valve is tricuspid. Aortic valve regurgitation is not  visualized. Aortic valve sclerosis is present, with no evidence of aortic  valve stenosis.   FINDINGS   Left Ventricle: Left ventricular ejection fraction, by estimation, is 55  to 60%. The left ventricle has normal function. The left ventricle has no  regional wall motion abnormalities. The left ventricular internal cavity  size was normal in size. There is   severe left ventricular hypertrophy. Abnormal (paradoxical) septal  motion, consistent with left bundle branch block. Left ventricular  diastolic parameters are consistent with Grade I diastolic dysfunction  (impaired relaxation).   Right Ventricle: The right ventricular size is normal. Right ventricular  systolic function is normal.   Left Atrium: Left atrial size was normal in size.   Right Atrium: Right atrial size was normal in size.   Pericardium: A small pericardial effusion is present.   Mitral Valve: The mitral valve is normal in structure. No evidence of   mitral valve regurgitation. No evidence of mitral valve stenosis.   Tricuspid Valve: The tricuspid valve is normal in structure. Tricuspid  valve regurgitation is not demonstrated. No evidence of tricuspid  stenosis.   Aortic Valve: The aortic valve is tricuspid. Aortic valve regurgitation is  not visualized. Aortic valve sclerosis is present, with no evidence of  aortic valve stenosis. Aortic valve peak gradient measures 6.4 mmHg.   Pulmonic Valve: The pulmonic valve was normal in structure. Pulmonic valve  regurgitation is not visualized. No evidence of pulmonic stenosis.   Aorta: The aortic root is normal in size and structure.   Venous: The inferior vena cava was not well visualized.   IAS/Shunts: The interatrial septum is aneurysmal. The interatrial septum  was not well visualized.   Additional Comments: Technically difficult; chordal SAM; no LVOT gradient  at rest.          Physical Exam:   VS:  BP 108/68   Pulse 65   Ht 5\' 11"  (1.803 m)   Wt 274 lb (124.3 kg)   SpO2 92%   BMI 38.22 kg/m    Wt Readings from Last 3 Encounters:  07/07/23  274 lb (124.3 kg)  06/27/23 271 lb 14.4 oz (123.3 kg)  06/26/23 275 lb (124.7 kg)    GEN: Well nourished, well developed in no acute distress NECK: No JVD; No carotid bruits CARDIAC: RRR, no murmurs, rubs, gallops RESPIRATORY:  Clear to auscultation without rales, wheezing or rhonchi  ABDOMEN: Soft, non-tender, non-distended EXTREMITIES:  No edema; No deformity   ASSESSMENT AND PLAN: .    Cerebrovascular Accident (CVA) Recent history of multiple strokes, with MRI showing small infarcts on both sides. Currently on dual antiplatelet therapy with Aspirin and Plavix. Concerns about interaction with Oxycodone (no evidence according to Dr. Izora Ribas). Pending 30-day monitor to assess for atrial fibrillation (AFib) which may influence anticoagulation strategy (will be placed in Feb after Amio washout) -Continue Aspirin and Plavix  for 3 weeks until 07/10/2023, then discontinue Aspirin and continue Plavix. -Plan for 30-day monitor to assess for AFib once Amiodarone is out of system (expected in February). -Consult with neurologist on 07/20/2023 at [facility]. Dr. Pearlean Brownie recommended  Chronic Pain Chronic pain due to multiple orthopedic issues, managed with Oxycodone. Concerns about interaction with Plavix. -Continue Oxycodone as prescribed.  Hypertrophic Cardiomyopathy (HCM) History of myomectomy for HCM. Small amount of pericardial fluid noted. -Plan for repeat echocardiogram towards end of February before follow-up with Dr. Izora Ribas on 08/22/2023.  Hypertension Managed with Metoprolol, with recent changes in dosing due to post-operative hypotension. Current blood pressure control suboptimal. -Switch to Metoprolol Tartrate, half in the morning and half in the evening. -Monitor blood pressure regularly and consider as-needed medication if systolic blood pressure exceeds 150.  LE Edema Noted swelling in legs, particularly right leg due to past ankle reconstruction. Currently on Lasix 20mg  daily. -Continue Lasix 20mg  daily, with an additional 20mg  as needed for weight gain or increased swelling.  Insomnia Difficulty sleeping, currently managed with Doxylamine and Tylenol PM. -Continue current sleep aids. -Encourage limiting daytime napping and water intake prior to bedtime to improve sleep quality.  General Health Maintenance -Increase fluid intake to 5-6 bottles of water per day. -Continue current vitamin regimen, consider reducing Vitamin C to 500mg  daily.   Dispo: He can follow-up in March with Dr. Izora Ribas  Signed, Sharlene Dory, PA-C

## 2023-07-10 ENCOUNTER — Encounter (HOSPITAL_COMMUNITY): Payer: Medicare Other

## 2023-07-10 DIAGNOSIS — E559 Vitamin D deficiency, unspecified: Secondary | ICD-10-CM | POA: Diagnosis not present

## 2023-07-10 DIAGNOSIS — Z1321 Encounter for screening for nutritional disorder: Secondary | ICD-10-CM | POA: Diagnosis not present

## 2023-07-10 DIAGNOSIS — E782 Mixed hyperlipidemia: Secondary | ICD-10-CM | POA: Diagnosis not present

## 2023-07-10 DIAGNOSIS — E039 Hypothyroidism, unspecified: Secondary | ICD-10-CM | POA: Diagnosis not present

## 2023-07-10 DIAGNOSIS — E1122 Type 2 diabetes mellitus with diabetic chronic kidney disease: Secondary | ICD-10-CM | POA: Diagnosis not present

## 2023-07-10 DIAGNOSIS — E7849 Other hyperlipidemia: Secondary | ICD-10-CM | POA: Diagnosis not present

## 2023-07-10 DIAGNOSIS — Z0000 Encounter for general adult medical examination without abnormal findings: Secondary | ICD-10-CM | POA: Diagnosis not present

## 2023-07-11 ENCOUNTER — Telehealth: Payer: Self-pay | Admitting: Internal Medicine

## 2023-07-11 MED ORDER — ROSUVASTATIN CALCIUM 40 MG PO TABS: 40.0000 mg | ORAL_TABLET | Freq: Every day | ORAL | 3 refills | Status: AC

## 2023-07-11 NOTE — Telephone Encounter (Signed)
Pt's medication was sent to pt's pharmacy as requested. Confirmation received.  °

## 2023-07-11 NOTE — Telephone Encounter (Signed)
*  STAT* If patient is at the pharmacy, call can be transferred to refill team.   1. Which medications need to be refilled? (please list name of each medication and dose if known)  rosuvastatin (CRESTOR) 40 MG tablet  2. Which pharmacy/location (including street and city if local pharmacy) is medication to be sent to? [facility] - [place], [place] - [address]  3. Do they need a 30 day or 90 day supply?   90 day supply

## 2023-07-12 ENCOUNTER — Encounter (HOSPITAL_COMMUNITY)
Admission: RE | Admit: 2023-07-12 | Discharge: 2023-07-12 | Disposition: A | Discharge status: Home / Self Care | Payer: Medicare Other | Source: Ambulatory Visit | Attending: Cardiovascular Disease | Admitting: Cardiovascular Disease

## 2023-07-12 DIAGNOSIS — Z9889 Other specified postprocedural states: Secondary | ICD-10-CM

## 2023-07-12 DIAGNOSIS — Z951 Presence of aortocoronary bypass graft: Secondary | ICD-10-CM

## 2023-07-12 NOTE — Progress Notes (Signed)
Daily Session Note  Patient Details  Name: Joseph [name]. MRN: [id number] Date of Birth: 01-May-1966 Referring Provider:   Flowsheet Row CARDIAC REHAB PHASE II ORIENTATION from 06/27/2023 in [facility]  Referring Provider Tonny Bollman MD       Encounter Date: 07/12/2023  Check In:  Session Check In - 07/12/23 0915       Check-In   Supervising physician immediately available to respond to emergencies See telemetry face sheet for immediately available MD    Location [facility]    Staff Present Ross Ludwig, BS, Exercise Physiologist;Brooke Elwyn Reach, RN    Virtual Visit No    Medication changes reported     No    Fall or balance concerns reported    No    Tobacco Cessation No Change    Warm-up and Cool-down Performed on first and last piece of equipment    Resistance Training Performed Yes    VAD Patient? No    PAD/SET Patient? No      Pain Assessment   Currently in Pain? No/denies    Pain Score 0-No pain    Multiple Pain Sites No             Capillary Blood Glucose: No results found for this or any previous visit (from the past 24 hours).    Social History   Tobacco Use  Smoking Status Former   Current packs/day: 0.00   Average packs/day: 1.5 packs/day for 15.0 years (22.5 ttl pk-yrs)   Types: Cigarettes   Start date: 04/20/1988   Quit date: 04/21/2003   Years since quitting: 20.2  Smokeless Tobacco Never  Tobacco Comments   Pt states that he chews on cigars.    Goals Met:  Independence with exercise equipment Exercise tolerated well No report of concerns or symptoms today Strength training completed today  Goals Unmet:  Not Applicable  Comments: Pt able to follow exercise prescription today without complaint.  Will continue to monitor for progression.

## 2023-07-14 ENCOUNTER — Encounter (HOSPITAL_COMMUNITY)
Admission: RE | Admit: 2023-07-14 | Discharge: 2023-07-14 | Disposition: A | Discharge status: Home / Self Care | Payer: Medicare Other | Source: Ambulatory Visit | Attending: Cardiovascular Disease | Admitting: Cardiovascular Disease

## 2023-07-14 DIAGNOSIS — Z951 Presence of aortocoronary bypass graft: Secondary | ICD-10-CM | POA: Diagnosis not present

## 2023-07-14 DIAGNOSIS — Z9889 Other specified postprocedural states: Secondary | ICD-10-CM | POA: Diagnosis not present

## 2023-07-14 LAB — GLUCOSE, CAPILLARY: Glucose-Capillary: 104 mg/dL — ABNORMAL HIGH (ref 70–99)

## 2023-07-14 NOTE — Progress Notes (Addendum)
At end of resistance training/balance work today pt c/o feeling lightheaded. Pt had completed TM and Nustep workouts without complaints. RN assessed BP at 92/58 and pt was cool and clammy to touch; water given. RN reviewed heart monitor and no events or changes noted other than possible PVC. RN checked CBG at 104, given Orange Juice. Spoke with wife in waiting room who stated he only had toast and juice for breakfast. Education around needing some protein with breakfast prior to exercise session discussed and verbalized understanding. She also stated 2 days ago his MD changed his extended release Toprol XL  back to regular metoprolol 12.mg BID. This could have also been reason for BP drop as it is faster acting and has a quicker release and peak time. Heather EP notified of this change and will continue to monitor BP at future sessions to alert MD if needed to alter med schedule.  Pt stated he was feeling better and BP up to 96/62 and left with wife.

## 2023-07-14 NOTE — Progress Notes (Addendum)
Daily Session Note  Patient Details  Name: Joseph [name]. MRN: [id number] Date of Birth: April 17, 1966 Referring Provider:   Flowsheet Row CARDIAC REHAB PHASE II ORIENTATION from 06/27/2023 in [facility]  Referring Provider Tonny Bollman MD       Encounter Date: 07/14/2023  Check In:  Session Check In - 07/14/23 0956       Check-In   Supervising physician immediately available to respond to emergencies See telemetry face sheet for immediately available MD    Location [facility]    Staff Present Bess Kinds, RN, BSN;Heather Fredric Mare, BS, Exercise Physiologist    Virtual Visit No    Medication changes reported     Yes    Comments MD changed from extended release toprol xl to metoprolol 12.5mg  BID    Fall or balance concerns reported    No    Tobacco Cessation No Change    Warm-up and Cool-down Performed on first and last piece of equipment    Resistance Training Performed Yes    VAD Patient? No    PAD/SET Patient? No      Pain Assessment   Currently in Pain? No/denies    Pain Score 0-No pain             Capillary Blood Glucose: Results for orders placed or performed during the hospital encounter of 07/14/23 (from the past 24 hours)  Glucose, capillary     Status: Abnormal   Collection Time: 07/14/23 10:20 AM  Result Value Ref Range   Glucose-Capillary 104 (H) 70 - 99 mg/dL      Social History   Tobacco Use  Smoking Status Former   Current packs/day: 0.00   Average packs/day: 1.5 packs/day for 15.0 years (22.5 ttl pk-yrs)   Types: Cigarettes   Start date: 04/20/1988   Quit date: 04/21/2003   Years since quitting: 20.2  Smokeless Tobacco Never  Tobacco Comments   Pt states that he chews on cigars.    Goals Met:  Independence with exercise equipment Exercise tolerated well No report of concerns or symptoms today Strength training completed today  Goals Unmet:  Not Applicable  Comments: Pt able to follow  exercise prescription today without complaint.  Will continue to monitor for progression.

## 2023-07-17 ENCOUNTER — Ambulatory Visit: Payer: Medicare Other | Admitting: Urology

## 2023-07-17 ENCOUNTER — Encounter (HOSPITAL_COMMUNITY): Payer: Medicare Other

## 2023-07-17 DIAGNOSIS — E782 Mixed hyperlipidemia: Secondary | ICD-10-CM | POA: Diagnosis not present

## 2023-07-17 DIAGNOSIS — D696 Thrombocytopenia, unspecified: Secondary | ICD-10-CM | POA: Diagnosis not present

## 2023-07-17 DIAGNOSIS — Z0000 Encounter for general adult medical examination without abnormal findings: Secondary | ICD-10-CM | POA: Diagnosis not present

## 2023-07-17 DIAGNOSIS — E1122 Type 2 diabetes mellitus with diabetic chronic kidney disease: Secondary | ICD-10-CM | POA: Diagnosis not present

## 2023-07-17 DIAGNOSIS — Z0001 Encounter for general adult medical examination with abnormal findings: Secondary | ICD-10-CM | POA: Diagnosis not present

## 2023-07-17 DIAGNOSIS — E039 Hypothyroidism, unspecified: Secondary | ICD-10-CM | POA: Diagnosis not present
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
## 2023-07-19 ENCOUNTER — Encounter (HOSPITAL_COMMUNITY): Payer: Medicare Other

## 2023-07-19 ENCOUNTER — Encounter (HOSPITAL_COMMUNITY): Payer: Self-pay | Admitting: *Deleted

## 2023-07-19 DIAGNOSIS — E039 Hypothyroidism, unspecified: Secondary | ICD-10-CM | POA: Diagnosis not present

## 2023-07-19 DIAGNOSIS — G4733 Obstructive sleep apnea (adult) (pediatric): Secondary | ICD-10-CM | POA: Diagnosis not present

## 2023-07-19 DIAGNOSIS — Z9889 Other specified postprocedural states: Secondary | ICD-10-CM

## 2023-07-19 DIAGNOSIS — I5020 Unspecified systolic (congestive) heart failure: Secondary | ICD-10-CM | POA: Diagnosis not present

## 2023-07-19 DIAGNOSIS — Z951 Presence of aortocoronary bypass graft: Secondary | ICD-10-CM

## 2023-07-19 DIAGNOSIS — E859 Amyloidosis, unspecified: Secondary | ICD-10-CM | POA: Diagnosis not present

## 2023-07-19 NOTE — Progress Notes (Signed)
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 someone is having stroke.   Knowledge Questionnaire Score:  Knowledge Questionnaire Score - 06/27/23 1545       Knowledge Questionnaire Score   Pre Score 21/26  Core Components/Risk Factors/Patient Goals at Admission:  Personal Goals and Risk Factors at Admission - 06/27/23 1545       Core Components/Risk Factors/Patient Goals on Admission    Weight Management Yes;Obesity;Weight Loss    Intervention Weight Management: Develop a combined nutrition and exercise program designed to reach desired caloric intake, while maintaining appropriate intake of nutrient and fiber, sodium and fats, and appropriate energy expenditure required for the weight goal.;Weight  Management: Provide education and appropriate resources to help participant work on and attain dietary goals.;Weight Management/Obesity: Establish reasonable short term and long term weight goals.;Obesity: Provide education and appropriate resources to help participant work on and attain dietary goals.    Admit Weight 271 lb 14.4 oz (123.3 kg)    Goal Weight: Short Term 265 lb (120.2 kg)    Goal Weight: Long Term 259 lb (117.5 kg)    Expected Outcomes Short Term: Continue to assess and modify interventions until short term weight is achieved;Long Term: Adherence to nutrition and physical activity/exercise program aimed toward attainment of established weight goal;Weight Loss: Understanding of general recommendations for a balanced deficit meal plan, which promotes 1-2 lb weight loss per week and includes a negative energy balance of [phone] kcal/d;Understanding recommendations for meals to include 15-35% energy as protein, 25-35% energy from fat, 35-60% energy from carbohydrates, less than 200mg  of dietary cholesterol, 20-35 gm of total fiber daily;Understanding of distribution of calorie intake throughout the day with the consumption of 4-5 meals/snacks    Hypertension Yes    Intervention Provide education on lifestyle modifcations including regular physical activity/exercise, weight management, moderate sodium restriction and increased consumption of fresh fruit, vegetables, and low fat dairy, alcohol moderation, and smoking cessation.;Monitor prescription use compliance.    Expected Outcomes Short Term: Continued assessment and intervention until BP is < 140/47mm HG in hypertensive participants. < 130/14mm HG in hypertensive participants with diabetes, heart failure or chronic kidney disease.;Long Term: Maintenance of blood pressure at goal levels.    Lipids Yes    Intervention Provide education and support for participant on nutrition & aerobic/resistive exercise along with prescribed medications to  achieve LDL 70mg , HDL >40mg .    Expected Outcomes Short Term: Participant states understanding of desired cholesterol values and is compliant with medications prescribed. Participant is following exercise prescription and nutrition guidelines.;Long Term: Cholesterol controlled with medications as prescribed, with individualized exercise RX and with personalized nutrition plan. Value goals: LDL < 70mg , HDL > 40 mg.             Core Components/Risk Factors/Patient Goals Review:    Core Components/Risk Factors/Patient Goals at Discharge (Final Review):    ITP Comments:  ITP Comments     Row Name 06/27/23 1520 06/30/23 0926 07/19/23 1117       ITP Comments Patient attend orientation today.  Patient is attending Cardiac Rehabilitation Program.  Documentation for diagnosis can be found in [facility] Admission 05/11/23.  Reviewed medical chart, RPE/RPD, gym safety, and program guidelines.  Patient was fitted to equipment they will be using during rehab.  Patient is scheduled to start exercise on Friday 06/30/23 at 915.   Initial ITP created and sent for review and signature by Dr. Dina Rich, Medical Director for Cardiac Rehabilitation Program. First full day of exercise!  Patient was oriented to gym and equipment including functions, settings, policies, and procedures.  Patient's individual exercise prescription and treatment plan were reviewed.  All starting workloads were established based on the results of the 6 minute walk  test done at initial orientation visit.  The plan for exercise progression was also introduced and progression will be customized based on patient's performance and goals. 30 day review completed. ITP sent to Dr. Dina Rich, Medical Director of Cardiac Rehab. Continue with ITP unless changes are made by physician.  Newer to program, switched class times as he was having a hard time with morning.              Comments: 30 day review

## 2023-07-20 ENCOUNTER — Encounter: Payer: Self-pay | Admitting: Adult Health

## 2023-07-20 ENCOUNTER — Ambulatory Visit: Payer: Medicare Other | Admitting: Adult Health

## 2023-07-20 VITALS — BP 92/66 | HR 62 | Ht 72.0 in | Wt 270.0 lb

## 2023-07-20 DIAGNOSIS — R413 Other amnesia: Secondary | ICD-10-CM

## 2023-07-20 DIAGNOSIS — R4701 Aphasia: Secondary | ICD-10-CM

## 2023-07-20 DIAGNOSIS — I639 Cerebral infarction, unspecified: Secondary | ICD-10-CM

## 2023-07-20 DIAGNOSIS — E785 Hyperlipidemia, unspecified: Secondary | ICD-10-CM | POA: Diagnosis not present

## 2023-07-20 NOTE — Patient Instructions (Addendum)
Your Plan:  Continue Plavix. Ok to stop ASA Blood pressure goal <130/90. Monitor BP- if remains low or you become symptomatic please make your cardiologist aware or go to nearest ED Cholesterol LDL goal <70 Diabetes goal A1c <7 Monitor diet and try to exercise  Consider speech therapy  Will retest memory at the next visit   Thank you for coming to see [place] at [facility]. I hope we have been able to provide you high quality care today.  You may receive a patient satisfaction survey over the next few weeks. We would appreciate your feedback and comments so that we may continue to improve ourselves and the health of our patients.

## 2023-07-21 ENCOUNTER — Encounter (HOSPITAL_COMMUNITY): Payer: Medicare Other

## 2023-07-21 ENCOUNTER — Encounter (HOSPITAL_COMMUNITY)
Admission: RE | Admit: 2023-07-21 | Discharge: 2023-07-21 | Disposition: A | Discharge status: Home / Self Care | Payer: Medicare Other | Source: Ambulatory Visit | Attending: Internal Medicine

## 2023-07-21 DIAGNOSIS — Z9889 Other specified postprocedural states: Secondary | ICD-10-CM | POA: Diagnosis not present

## 2023-07-21 DIAGNOSIS — Z951 Presence of aortocoronary bypass graft: Secondary | ICD-10-CM | POA: Diagnosis not present

## 2023-07-21 NOTE — Progress Notes (Signed)
Daily Session Note  Patient Details  Name: Joseph [name]. MRN: [id number] Date of Birth: November 24, 1965 Referring Provider:   Flowsheet Row CARDIAC REHAB PHASE II ORIENTATION from 06/27/2023 in [facility] CARDIAC REHABILITATION  Referring Provider Tonny Bollman MD       Encounter Date: 07/21/2023  Check In:  Session Check In - 07/21/23 1430       Check-In   Supervising physician immediately available to respond to emergencies See telemetry face sheet for immediately available ER MD    Location [facility]    Staff Present Ross Ludwig, BS, Exercise Physiologist;Tina Jake Shark, Margarite Gouge, RN, BSN    Virtual Visit No    Medication changes reported     No    Fall or balance concerns reported    No    Warm-up and Cool-down Performed on first and last piece of equipment    Resistance Training Performed Yes    VAD Patient? No    PAD/SET Patient? No      Pain Assessment   Currently in Pain? No/denies    Pain Score 0-No pain    Multiple Pain Sites No             Capillary Blood Glucose: No results found for this or any previous visit (from the past 24 hours).    Social History   Tobacco Use  Smoking Status Former   Current packs/day: 0.00   Average packs/day: 1.5 packs/day for 15.0 years (22.5 ttl pk-yrs)   Types: Cigarettes   Start date: 04/20/1988   Quit date: 04/21/2003   Years since quitting: 20.2  Smokeless Tobacco Never  Tobacco Comments   Pt states that he chews on cigars.    Goals Met:  Independence with exercise equipment Exercise tolerated well No report of concerns or symptoms today Strength training completed today  Goals Unmet:  Not Applicable  Comments: Pt able to follow exercise prescription today without complaint.  Will continue to monitor for progression.

## 2023-07-24 ENCOUNTER — Encounter (HOSPITAL_COMMUNITY)
Admission: RE | Admit: 2023-07-24 | Discharge: 2023-07-24 | Disposition: A | Discharge status: Home / Self Care | Payer: Medicare Other | Source: Ambulatory Visit | Attending: Internal Medicine | Admitting: Internal Medicine

## 2023-07-24 ENCOUNTER — Encounter (HOSPITAL_COMMUNITY): Payer: Medicare Other

## 2023-07-24 DIAGNOSIS — Z951 Presence of aortocoronary bypass graft: Secondary | ICD-10-CM | POA: Diagnosis not present

## 2023-07-24 DIAGNOSIS — Z9889 Other specified postprocedural states: Secondary | ICD-10-CM | POA: Diagnosis not present

## 2023-07-24 NOTE — Progress Notes (Signed)
Daily Session Note  Patient Details  Name: Joseph [name]. MRN: [id number] Date of Birth: 1966/02/12 Referring Provider:   Flowsheet Row CARDIAC REHAB PHASE II ORIENTATION from 06/27/2023 in [facility] CARDIAC REHABILITATION  Referring Provider Tonny Bollman MD       Encounter Date: 07/24/2023  Check In:  Session Check In - 07/24/23 1430       Check-In   Supervising physician immediately available to respond to emergencies See telemetry face sheet for immediately available MD    Location [facility]    Staff Present Hulen Luster, BS, RRT, CPFT;Jessica [place], MA, RCEP, CCRP, Dow Adolph, RN, BSN;Ezrah Panning, RN    Virtual Visit No    Medication changes reported     No    Fall or balance concerns reported    No    Warm-up and Cool-down Performed on first and last piece of equipment    Resistance Training Performed Yes    VAD Patient? No    PAD/SET Patient? No      Pain Assessment   Currently in Pain? No/denies    Pain Score 0-No pain    Multiple Pain Sites No             Capillary Blood Glucose: No results found for this or any previous visit (from the past 24 hours).    Social History   Tobacco Use  Smoking Status Former   Current packs/day: 0.00   Average packs/day: 1.5 packs/day for 15.0 years (22.5 ttl pk-yrs)   Types: Cigarettes   Start date: 04/20/1988   Quit date: 04/21/2003   Years since quitting: 20.2  Smokeless Tobacco Never  Tobacco Comments   Pt states that he chews on cigars.    Goals Met:  Independence with exercise equipment Exercise tolerated well No report of concerns or symptoms today Strength training completed today  Goals Unmet:  Not Applicable  Comments: Pt able to follow exercise prescription today without complaint.  Will continue to monitor for progression.

## 2023-07-26 ENCOUNTER — Encounter (HOSPITAL_COMMUNITY): Payer: Medicare Other

## 2023-07-26 ENCOUNTER — Encounter (HOSPITAL_COMMUNITY)
Admission: RE | Admit: 2023-07-26 | Discharge: 2023-07-26 | Disposition: A | Discharge status: Home / Self Care | Payer: Medicare Other | Source: Ambulatory Visit | Attending: Internal Medicine

## 2023-07-26 DIAGNOSIS — Z951 Presence of aortocoronary bypass graft: Secondary | ICD-10-CM | POA: Diagnosis not present

## 2023-07-26 DIAGNOSIS — Z9889 Other specified postprocedural states: Secondary | ICD-10-CM

## 2023-07-26 NOTE — Progress Notes (Signed)
 Daily Session Note  Patient Details  Name: Joseph [name]. MRN: [id number] Date of Birth: Jun 23, 1965 Referring Provider:   Flowsheet Row CARDIAC REHAB PHASE II ORIENTATION from 06/27/2023 in [facility] CARDIAC REHABILITATION  Referring Provider [name] MD       Encounter Date: 07/26/2023  Check In:  Session Check In - 07/26/23 1428       Check-In   Supervising physician immediately available to respond to emergencies See telemetry face sheet for immediately available MD    Location [facility]    Staff Present Powell Benders, BS, Exercise Physiologist;Debra Vicci, RN, Randye Gelineau, MA, RCEP, CCRP, CCET    Virtual Visit No    Medication changes reported     No    Fall or balance concerns reported    No    Warm-up and Cool-down Performed on first and last piece of equipment    Resistance Training Performed Yes    VAD Patient? No    PAD/SET Patient? No      Pain Assessment   Currently in Pain? No/denies             Capillary Blood Glucose: No results found for this or any previous visit (from the past 24 hours).    Social History   Tobacco Use  Smoking Status Former   Current packs/day: 0.00   Average packs/day: 1.5 packs/day for 15.0 years (22.5 ttl pk-yrs)   Types: Cigarettes   Start date: 04/20/1988   Quit date: 04/21/2003   Years since quitting: 20.2  Smokeless Tobacco Never  Tobacco Comments   Pt states that he chews on cigars.    Goals Met:  Exercise tolerated well No report of concerns or symptoms today Strength training completed today  Goals Unmet:  Not Applicable  Comments: Pt able to follow exercise prescription today without complaint.  Will continue to monitor for progression.

## 2023-07-28 ENCOUNTER — Encounter (HOSPITAL_COMMUNITY): Payer: Medicare Other

## 2023-07-31 ENCOUNTER — Encounter (HOSPITAL_COMMUNITY): Payer: Medicare Other

## 2023-08-02 ENCOUNTER — Encounter (HOSPITAL_COMMUNITY)
Admission: RE | Admit: 2023-08-02 | Discharge: 2023-08-02 | Disposition: A | Discharge status: Home / Self Care | Payer: Medicare Other | Source: Ambulatory Visit | Attending: Internal Medicine

## 2023-08-02 ENCOUNTER — Encounter (HOSPITAL_COMMUNITY): Payer: Medicare Other

## 2023-08-02 DIAGNOSIS — Z9889 Other specified postprocedural states: Secondary | ICD-10-CM | POA: Diagnosis not present

## 2023-08-02 DIAGNOSIS — Z951 Presence of aortocoronary bypass graft: Secondary | ICD-10-CM | POA: Diagnosis not present

## 2023-08-02 LAB — GLUCOSE, CAPILLARY: Glucose-Capillary: 100 mg/dL — ABNORMAL HIGH (ref 70–99)

## 2023-08-02 NOTE — Progress Notes (Signed)
Daily Session Note  Patient Details  Name: Joseph [name]. MRN: [id number] Date of Birth: 29-Sep-1965 Referring Provider:   Flowsheet Row CARDIAC REHAB PHASE II ORIENTATION from 06/27/2023 in [facility] CARDIAC REHABILITATION  Referring Provider Tonny Bollman MD       Encounter Date: 08/02/2023  Check In:  Session Check In - 08/02/23 1400       Check-In   Supervising physician immediately available to respond to emergencies See telemetry face sheet for immediately available MD    Location [facility]    Staff Present Avanell Shackleton BSN, RN;Heather Fredric Mare, [place], Exercise Physiologist    Virtual Visit No    Medication changes reported     No    Fall or balance concerns reported    No    Tobacco Cessation No Change    Warm-up and Cool-down Performed on first and last piece of equipment    Resistance Training Performed Yes    VAD Patient? No    PAD/SET Patient? No      Pain Assessment   Currently in Pain? No/denies    Pain Score 0-No pain    Multiple Pain Sites No             Capillary Blood Glucose: No results found for this or any previous visit (from the past 24 hours).    Social History   Tobacco Use  Smoking Status Former   Current packs/day: 0.00   Average packs/day: 1.5 packs/day for 15.0 years (22.5 ttl pk-yrs)   Types: Cigarettes   Start date: 04/20/1988   Quit date: 04/21/2003   Years since quitting: 20.2  Smokeless Tobacco Never  Tobacco Comments   Pt states that he chews on cigars.    Goals Met:  Independence with exercise equipment Exercise tolerated well No report of concerns or symptoms today Strength training completed today  Goals Unmet:  Not Applicable  Comments: Marland KitchenMarland KitchenPt able to follow exercise prescription today without complaint.  Will continue to monitor for progression.

## 2023-08-04 ENCOUNTER — Encounter (HOSPITAL_COMMUNITY): Payer: Medicare Other

## 2023-08-04 NOTE — Progress Notes (Signed)
I agree with the above plan

## 2023-08-07 ENCOUNTER — Encounter: Payer: Self-pay | Admitting: Internal Medicine

## 2023-08-07 ENCOUNTER — Encounter (HOSPITAL_COMMUNITY)
Admission: RE | Admit: 2023-08-07 | Discharge: 2023-08-07 | Disposition: A | Discharge status: Home / Self Care | Payer: Medicare Other | Source: Ambulatory Visit | Attending: Internal Medicine | Admitting: Internal Medicine

## 2023-08-07 ENCOUNTER — Encounter (HOSPITAL_COMMUNITY): Payer: Self-pay | Admitting: *Deleted

## 2023-08-07 ENCOUNTER — Telehealth (HOSPITAL_COMMUNITY): Payer: Self-pay | Admitting: *Deleted

## 2023-08-07 ENCOUNTER — Encounter (HOSPITAL_COMMUNITY): Payer: Medicare Other

## 2023-08-07 ENCOUNTER — Telehealth: Payer: Self-pay | Admitting: Internal Medicine

## 2023-08-07 DIAGNOSIS — E039 Hypothyroidism, unspecified: Secondary | ICD-10-CM | POA: Diagnosis not present

## 2023-08-07 DIAGNOSIS — Z9889 Other specified postprocedural states: Secondary | ICD-10-CM

## 2023-08-07 DIAGNOSIS — Z951 Presence of aortocoronary bypass graft: Secondary | ICD-10-CM

## 2023-08-07 NOTE — Telephone Encounter (Signed)
Called spoke with spouse ok per DPR.   Reports metoprolol tartrate 12.5 mg BID was dropping pt BP to quickly. Spouse switched pt back to metoprolol succinate 12.5 mg PO at bedtime. Reports even with this change pt BP drops.  Please see my chart message for BP readings for today and yesterday with new regimen per spouse.  Would like MD input on how to give pt medication. Advised spouse to ensure pt is getting enough PO intake.  As reports he is not good with PO intake since stroke.  Also advised her if pt is sympotmatic with low BP to give him a small salty snack and have him increase fluid intake.  Will send to MD to advise.

## 2023-08-07 NOTE — Telephone Encounter (Signed)
Patient's wife, Joseph [name], called to let us know that JC was not feeling well again today with his blood pressure.  Last Wednesday, it dropped into the low 80s in class but we were able to get it above 90 to allow him to leave.  Today, his wife said his blood pressure was 139/84 this morning and they withheld his blood pressure meds, but by lunch time he had dropped to 94/60s.  He is complaining of feeling lightheaded and dizzy.  She is going to call the cardiologist office to see if they can get in to be seen prior to his appointment next week. They will be out the rest of the week until he feels better and the weather improves.

## 2023-08-07 NOTE — Telephone Encounter (Signed)
Called spoke with spouse please refer to my chart encounter for more details.

## 2023-08-07 NOTE — Telephone Encounter (Signed)
Pt c/o BP issue: STAT if pt c/o blurred vision, one-sided weakness or slurred speech  1. What are your last 5 BP readings?   This morning 134/89 (no meds) Today - 98/56 prior to going to [employer]  121/78  129/85 Last night - 114/72 (no meds)  2. Are you having any other symptoms (ex. Dizziness, headache, blurred vision, passed out)?   Cold sweat, dizzy, a little blurred vision  3. What is your BP issue?    Wife stated patient has been having cold sweats since starting on the [name].  Wife noted patient's BP got so low that patient did not take his metoprolol.  Wife noted he has not been able to do his cardiac rehab due to BP issue.

## 2023-08-08 ENCOUNTER — Encounter: Payer: Self-pay | Admitting: Nurse Practitioner

## 2023-08-08 DIAGNOSIS — E039 Hypothyroidism, unspecified: Secondary | ICD-10-CM

## 2023-08-08 LAB — T4, FREE: Free T4: 1.1 ng/dL (ref 0.82–1.77)

## 2023-08-08 LAB — TSH: TSH: 14.1 u[IU]/mL — ABNORMAL HIGH (ref 0.450–4.500)

## 2023-08-08 NOTE — Telephone Encounter (Signed)
 See patient response.

## 2023-08-08 NOTE — Telephone Encounter (Signed)
Please schedule patient for follow up in 3 months, with previsit labs.

## 2023-08-09 ENCOUNTER — Encounter (HOSPITAL_COMMUNITY): Payer: Medicare Other

## 2023-08-10 DIAGNOSIS — Z79891 Long term (current) use of opiate analgesic: Secondary | ICD-10-CM | POA: Diagnosis not present

## 2023-08-11 ENCOUNTER — Encounter (HOSPITAL_COMMUNITY): Payer: Medicare Other

## 2023-08-14 ENCOUNTER — Encounter (HOSPITAL_COMMUNITY): Payer: Medicare Other

## 2023-08-14 ENCOUNTER — Encounter: Payer: Self-pay | Admitting: Internal Medicine

## 2023-08-15 ENCOUNTER — Encounter: Payer: Self-pay | Admitting: Urology

## 2023-08-15 ENCOUNTER — Ambulatory Visit (HOSPITAL_COMMUNITY): Payer: Medicare Other | Attending: Physician Assistant

## 2023-08-15 ENCOUNTER — Other Ambulatory Visit: Payer: Self-pay | Admitting: *Deleted

## 2023-08-15 DIAGNOSIS — I421 Obstructive hypertrophic cardiomyopathy: Secondary | ICD-10-CM | POA: Diagnosis not present

## 2023-08-15 LAB — ECHOCARDIOGRAM COMPLETE
Area-P 1/2: 5.23 cm2
S' Lateral: 3 cm

## 2023-08-15 MED ORDER — SILODOSIN 4 MG PO CAPS: 4.0000 mg | ORAL_CAPSULE | Freq: Every day | ORAL | 1 refills | Status: DC

## 2023-08-15 NOTE — Telephone Encounter (Signed)
 Notified patient as instructed, patient pleased. Medication sent.

## 2023-08-16 ENCOUNTER — Encounter: Payer: Self-pay | Admitting: Physician Assistant

## 2023-08-16 ENCOUNTER — Encounter (HOSPITAL_COMMUNITY): Admission: RE | Admit: 2023-08-16 | Payer: Medicare Other | Source: Ambulatory Visit

## 2023-08-16 ENCOUNTER — Encounter (HOSPITAL_COMMUNITY): Payer: Self-pay | Admitting: *Deleted

## 2023-08-16 ENCOUNTER — Encounter (HOSPITAL_COMMUNITY): Payer: Medicare Other

## 2023-08-16 DIAGNOSIS — Z9889 Other specified postprocedural states: Secondary | ICD-10-CM

## 2023-08-16 DIAGNOSIS — Z951 Presence of aortocoronary bypass graft: Secondary | ICD-10-CM

## 2023-08-16 NOTE — Progress Notes (Addendum)
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 Intimacy -Discus the effect sexual activity has on the heart, how changes occur during intimacy as we age, and safety during sexual activity.   Smoking Cessation / COPD -Discuss different methods to quit smoking, the health benefits of quitting smoking, and the definition of COPD.   Nutrition I: Fats -Discuss the types of cholesterol, what cholesterol does to the heart, and how cholesterol levels can  be controlled.   Nutrition II: Labels -Discuss the different components of food labels and how to read food label   Heart Parts/Heart Disease and PAD -Discuss the anatomy of the heart, the pathway of blood circulation through the heart, and these are affected by heart disease.   Stress I: Signs and Symptoms -Discuss the causes of stress, how  stress may lead to anxiety and depression, and ways to limit stress.   Stress II: Relaxation -Discuss different types of relaxation techniques to limit stress.   Warning Signs of Stroke / TIA -Discuss definition of a stroke, what the signs and symptoms are of a stroke, and how to identify when someone is having stroke.   Knowledge Questionnaire Score:  Knowledge Questionnaire Score - 06/27/23 1545       Knowledge Questionnaire Score   Pre Score 21/26             Core Components/Risk Factors/Patient Goals at Admission:  Personal Goals and Risk Factors at Admission - 06/27/23 1545       Core Components/Risk Factors/Patient Goals on Admission    Weight Management Yes;Obesity;Weight Loss    Intervention Weight Management: Develop a combined nutrition and exercise program designed to reach desired caloric intake, while maintaining appropriate intake of nutrient and fiber, sodium and fats, and appropriate energy expenditure required for the weight goal.;Weight Management: Provide education and appropriate resources to help participant work on and attain dietary goals.;Weight Management/Obesity: Establish reasonable short term and long term weight goals.;Obesity: Provide education and appropriate resources to help participant work on and attain dietary goals.    Admit Weight 271 lb 14.4 oz (123.3 kg)    Goal Weight: Short Term 265 lb (120.2 kg)    Goal Weight: Long Term 259 lb (117.5 kg)    Expected Outcomes Short Term: Continue to assess and modify interventions until short term weight is achieved;Long Term: Adherence to nutrition and physical activity/exercise program aimed toward attainment of established weight goal;Weight Loss: Understanding of general recommendations for a balanced deficit meal plan, which promotes 1-2 lb weight loss per week and includes a negative energy balance of [phone] kcal/d;Understanding recommendations for meals to include 15-35% energy as protein, 25-35%  energy from fat, 35-60% energy from carbohydrates, less than 200mg  of dietary cholesterol, 20-35 gm of total fiber daily;Understanding of distribution of calorie intake throughout the day with the consumption of 4-5 meals/snacks    Hypertension Yes    Intervention Provide education on lifestyle modifcations including regular physical activity/exercise, weight management, moderate sodium restriction and increased consumption of fresh fruit, vegetables, and low fat dairy, alcohol moderation, and smoking cessation.;Monitor prescription use compliance.    Expected Outcomes Short Term: Continued assessment and intervention until BP is < 140/77mm HG in hypertensive participants. < 130/1mm HG in hypertensive participants with diabetes, heart failure or chronic kidney disease.;Long Term: Maintenance of blood pressure at goal levels.    Lipids Yes    Intervention Provide education and support for participant on nutrition & aerobic/resistive exercise along with prescribed medications to achieve LDL 70mg , HDL >40mg .    Expected Outcomes Short Term: Participant states understanding of desired cholesterol values and is compliant with medications prescribed. Participant is following exercise prescription and nutrition guidelines.;Long Term: Cholesterol controlled with medications as prescribed, with individualized exercise RX and with personalized nutrition plan. Value goals: LDL < 70mg , HDL > 40 mg.             Core Components/Risk Factors/Patient Goals Review:    Core Components/Risk Factors/Patient  Goals at Discharge (Final Review):    ITP Comments:  ITP Comments     Row Name 06/27/23 1520 06/30/23 0926 07/19/23 1117 08/07/23 1258 08/07/23 1551   ITP Comments Patient attend orientation today.  Patient is attending Cardiac Rehabilitation Program.  Documentation for diagnosis can be found in [facility] Admission 05/11/23.  Reviewed medical chart, RPE/RPD, gym safety, and program guidelines.   Patient was fitted to equipment they will be using during rehab.  Patient is scheduled to start exercise on Friday 06/30/23 at 915.   Initial ITP created and sent for review and signature by Dr. Dina Rich, Medical Director for Cardiac Rehabilitation Program. First full day of exercise!  Patient was oriented to gym and equipment including functions, settings, policies, and procedures.  Patient's individual exercise prescription and treatment plan were reviewed.  All starting workloads were established based on the results of the 6 minute walk test done at initial orientation visit.  The plan for exercise progression was also introduced and progression will be customized based on patient's performance and goals. 30 day review completed. ITP sent to Dr. Dina Rich, Medical Director of Cardiac Rehab. Continue with ITP unless changes are made by physician.  Newer to program, switched class times as he was having a hard time with morning. Patient's wife, Efraim Kaufmann, called to let us know that JC was not feeling well again today with his blood pressure.  Last Wednesday, it dropped into the low 80s in class but we were able to get it above 90 to allow him to leave.  Today, his wife said his blood pressure was 139/84 this morning and they withheld his blood pressure meds, but by lunch time he had dropped to 94/60s.  He is complaining of feeling lightheaded and dizzy.  She is going to call the cardiologist office to see if they can get in to be seen prior to his appointment next week. They will be out the rest of the week until he feels better and the weather improves. Note from Dr. Izora Ribas: Thanks for letting me know.   He has has some low blood pressures, most prominent post myectomy. I recommend holding his metoprolol. If his blood pressure is still very low, he should follow up with with pain management MD as they med need to wean off some of his other medications.  Thanks, MAC: Patient notified.    Row  Name 08/16/23 0846           ITP Comments 30 day review completed. ITP sent to Dr. Dina Rich, Medical Director of Cardiac Rehab. Continue with ITP unless changes are made by physician.    Pt has been out since 08/01/22 with his BP continuing to drop.  He had echo done yesterday, waiting for results and ability to return to rehab. Unable to assess for goals as BP has been dropping in class.                Comments: 30 day review

## 2023-08-17 NOTE — Telephone Encounter (Signed)
 Pt's wife called stating silodosin is too expensive and if there was anything cheaper to replace it? Please advise.

## 2023-08-18 ENCOUNTER — Encounter (HOSPITAL_COMMUNITY): Payer: Medicare Other

## 2023-08-19 LAB — BASIC METABOLIC PANEL
BUN/Creatinine Ratio: 14 (ref 10–24)
BUN: 13 mg/dL (ref 8–27)
CO2: 25 mmol/L (ref 20–29)
Calcium: 9.4 mg/dL (ref 8.6–10.2)
Chloride: 104 mmol/L (ref 96–106)
Creatinine, Ser: 0.93 mg/dL (ref 0.76–1.27)
Glucose: 139 mg/dL — ABNORMAL HIGH (ref 70–99)
Potassium: 5 mmol/L (ref 3.5–5.2)
Sodium: 141 mmol/L (ref 134–144)
eGFR: 94 mL/min/{1.73_m2} (ref >59)

## 2023-08-19 LAB — PRO B NATRIURETIC PEPTIDE: NT-Pro BNP: 44 pg/mL (ref 0–210)

## 2023-08-21 ENCOUNTER — Ambulatory Visit (HOSPITAL_BASED_OUTPATIENT_CLINIC_OR_DEPARTMENT_OTHER): Payer: Medicare Other | Admitting: Pulmonary Disease

## 2023-08-21 ENCOUNTER — Encounter (HOSPITAL_COMMUNITY): Payer: Medicare Other | Attending: Internal Medicine

## 2023-08-21 ENCOUNTER — Encounter (HOSPITAL_COMMUNITY): Payer: Medicare Other

## 2023-08-21 ENCOUNTER — Encounter (HOSPITAL_BASED_OUTPATIENT_CLINIC_OR_DEPARTMENT_OTHER): Payer: Self-pay | Admitting: Pulmonary Disease

## 2023-08-21 VITALS — BP 112/68 | HR 88 | Ht 72.0 in | Wt 268.6 lb

## 2023-08-21 DIAGNOSIS — I421 Obstructive hypertrophic cardiomyopathy: Secondary | ICD-10-CM

## 2023-08-21 DIAGNOSIS — Z9889 Other specified postprocedural states: Secondary | ICD-10-CM | POA: Insufficient documentation

## 2023-08-21 DIAGNOSIS — R0902 Hypoxemia: Secondary | ICD-10-CM | POA: Diagnosis not present

## 2023-08-21 DIAGNOSIS — Z951 Presence of aortocoronary bypass graft: Secondary | ICD-10-CM | POA: Insufficient documentation

## 2023-08-21 DIAGNOSIS — G4733 Obstructive sleep apnea (adult) (pediatric): Secondary | ICD-10-CM | POA: Diagnosis not present

## 2023-08-21 NOTE — Addendum Note (Signed)
 Addended by: Amada Kingfisher on: 08/21/2023 04:22 PM   Modules accepted: Orders

## 2023-08-21 NOTE — Patient Instructions (Addendum)
 X Ambulatory sat  ONO on CPAP/RA  CPAP is working well

## 2023-08-21 NOTE — Progress Notes (Signed)
 Subjective:    Patient ID: Joseph [name]., male    DOB: [birth year], 58 y.o.   MRN: [id number]  HPI  OSA was diagnosed in 2008 he has been maintained on CPAP machine since then. He was set up with CPAP 15 cm H2O after sleep study in 2017.  He had nocturnal oximetry in 2018 showed desaturations but repeat titration study in 2018 showed CPAP requirement of 11 cm without any need for supplemental oxygen. 2023 - AirSense 11 AutoSet machine -DME  [facility]   PMH - CAD s/p stent to LAD 2015  -disabled due to [facility] which caused multiple injuries.  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 He reports ongoing memory and fatigue issues. Since the last visit, he underwent a myectomy in [place], [place], and CABG. Post-surgery, he has been struggling with low blood pressure, leading to discontinuation of all blood pressure medications. He also reports low oxygen levels, particularly when sedentary, with levels dropping to 86-87%. He denies any history of lung issues. The patient also had three strokes, one TIA in [place] and two more in December, which have affected his clarity. He is currently on Flomax, which was changed to alfuzosin due to its potential to lower blood pressure, and Lasix 20mg  daily for fluid in the pericardium. The patient uses CPAP regularly with good results.  He did not desaturate on ambulation  CPAP download was reviewed which showed excellent control of events on auto settings 4 to 20 cm with average pressure of 10 cm and maximum 11.  He is very compliant about 10 hours per night, has mild leak.  CPAP is only helped improve his daytime somnolence and fatigue   Significant tests/ events reviewed   CPAP titration 11/07/16 >> CPAP 11 cm H2O >> AHI 0.4, didn't need  supplemental oxygen.    ONO with CPAP 10/19/16 >> test time 10 hrs 11 min. Baseline SpO2 89.9%, low SpO2 84%. Spent 1 hr 39 min with SpO2 < 88%.    PSG 05/05/07 AHI 30, SpO2 low 79% PSG 02/17/16 >> AHI 23.2, SpO2 low 74%   Pulmonary tests CT angio chest 01/22/16 >> fatty liver, Rt adrenal adenoma PFT 09/01/16 >> FEV1 3.21 (90%), FEV1% 79, TLC 7.10 (101%), DLCO 83%  Review of Systems neg for any significant sore throat, dysphagia, itching, sneezing, nasal congestion or excess/ purulent secretions, fever, chills, sweats, unintended wt loss, pleuritic or exertional cp, hempoptysis, orthopnea pnd or change in chronic leg swelling. Also denies presyncope, palpitations, heartburn, abdominal pain, nausea, vomiting, diarrhea or change in bowel or urinary habits, dysuria,hematuria, rash, arthralgias, visual complaints, headache, numbness weakness or ataxia.     Objective:   Physical Exam  Gen. Pleasant, obese, in no distress ENT - no lesions, no post nasal drip Neck: No JVD, no thyromegaly, no carotid bruits Lungs: no use of accessory muscles, no dullness to percussion, decreased without rales or rhonchi  Cardiovascular: Rhythm regular, heart sounds  normal, no murmurs or gallops, no peripheral edema Musculoskeletal: No deformities, no cyanosis or clubbing , no tremors        Assessment & Plan:   Obstructive Sleep Apnea Obstructive sleep apnea is well-managed with CPAP therapy, reducing apnea episodes from 20 per hour to 0.9. He is compliant, using CPAP for about 10 hours nightly. The Inspire device was discussed  but not recommended due to current CPAP success. - Continue CPAP therapy as prescribed -Change to auto CPAP 8 to 12 cm  Hypoxia Experiences hypoxia, particularly when sedentary, with oxygen saturation dropping to 86-87%. This may relate to recent cardiac surgery recovery. Differential diagnosis includes cardiac-related hypoxia versus potential lung component, though previous lung  function tests were normal. Short-term oxygen therapy during recovery was discussed, acknowledging recovery from heart surgery can take 3 to 6 months, sometimes up to a year. - Perform ambulatory saturation test to assess oxygen levels during activity - Conduct nocturnal oximetry on CPAP to evaluate oxygen levels during sleep - Reviewed recent chest X-ray for lung pathology >> low volumes , small effusions 05/2023    Obstructive Cardiomyopathy Post-myectomy for obstructive cardiomyopathy in November, he experiences low blood pressure, leading to cessation of antihypertensive medications. Under cardiologist care for ongoing management. Recent echocardiogram showed no pericardial fluid, and left bundle branch block persists. Lasix reduction to 10 mg depends on cardiologist's evaluation of fluid status and echocardiogram results. - Follow up with cardiologist for blood pressure and cardiac status management - Consider reducing Lasix to 10 mg if fluid status is stable, pending cardiologist's evaluation  Cerebrovascular Accident (Stroke) Suffered a TIA in November and two strokes in December, affecting cognitive clarity. Strokes may contribute to current hypoxia and fatigue. - Coordinate care with neurology as needed

## 2023-08-22 ENCOUNTER — Emergency Department (HOSPITAL_COMMUNITY)

## 2023-08-22 ENCOUNTER — Encounter (HOSPITAL_COMMUNITY): Payer: Self-pay

## 2023-08-22 ENCOUNTER — Other Ambulatory Visit: Payer: Self-pay

## 2023-08-22 ENCOUNTER — Ambulatory Visit: Payer: Medicare Other | Attending: Internal Medicine | Admitting: Internal Medicine

## 2023-08-22 ENCOUNTER — Observation Stay (HOSPITAL_COMMUNITY)
Admission: EM | Admit: 2023-08-22 | Discharge: 2023-08-23 | Disposition: A | Discharge status: Home / Self Care | Attending: Internal Medicine | Admitting: Internal Medicine

## 2023-08-22 ENCOUNTER — Inpatient Hospital Stay (HOSPITAL_BASED_OUTPATIENT_CLINIC_OR_DEPARTMENT_OTHER)

## 2023-08-22 ENCOUNTER — Inpatient Hospital Stay: Admit: 2023-08-22 | Admitting: Internal Medicine

## 2023-08-22 VITALS — BP 60/40 | HR 101 | Ht 72.0 in | Wt 268.4 lb

## 2023-08-22 DIAGNOSIS — I4891 Unspecified atrial fibrillation: Secondary | ICD-10-CM | POA: Insufficient documentation

## 2023-08-22 DIAGNOSIS — I10 Essential (primary) hypertension: Secondary | ICD-10-CM | POA: Diagnosis not present

## 2023-08-22 DIAGNOSIS — Z96642 Presence of left artificial hip joint: Secondary | ICD-10-CM | POA: Insufficient documentation

## 2023-08-22 DIAGNOSIS — I421 Obstructive hypertrophic cardiomyopathy: Secondary | ICD-10-CM

## 2023-08-22 DIAGNOSIS — I422 Other hypertrophic cardiomyopathy: Secondary | ICD-10-CM | POA: Diagnosis not present

## 2023-08-22 DIAGNOSIS — Z9049 Acquired absence of other specified parts of digestive tract: Secondary | ICD-10-CM | POA: Diagnosis not present

## 2023-08-22 DIAGNOSIS — I959 Hypotension, unspecified: Principal | ICD-10-CM | POA: Diagnosis present

## 2023-08-22 DIAGNOSIS — J9811 Atelectasis: Secondary | ICD-10-CM | POA: Diagnosis not present

## 2023-08-22 DIAGNOSIS — E854 Organ-limited amyloidosis: Secondary | ICD-10-CM | POA: Diagnosis not present

## 2023-08-22 DIAGNOSIS — N049 Nephrotic syndrome with unspecified morphologic changes: Secondary | ICD-10-CM | POA: Diagnosis not present

## 2023-08-22 DIAGNOSIS — Z87891 Personal history of nicotine dependence: Secondary | ICD-10-CM | POA: Diagnosis not present

## 2023-08-22 DIAGNOSIS — Z79899 Other long term (current) drug therapy: Secondary | ICD-10-CM | POA: Diagnosis not present

## 2023-08-22 DIAGNOSIS — Z7982 Long term (current) use of aspirin: Secondary | ICD-10-CM | POA: Diagnosis not present

## 2023-08-22 DIAGNOSIS — Z7902 Long term (current) use of antithrombotics/antiplatelets: Secondary | ICD-10-CM | POA: Insufficient documentation

## 2023-08-22 DIAGNOSIS — E8589 Other amyloidosis: Secondary | ICD-10-CM | POA: Diagnosis not present

## 2023-08-22 DIAGNOSIS — I2510 Atherosclerotic heart disease of native coronary artery without angina pectoris: Secondary | ICD-10-CM | POA: Insufficient documentation

## 2023-08-22 DIAGNOSIS — E039 Hypothyroidism, unspecified: Secondary | ICD-10-CM | POA: Insufficient documentation

## 2023-08-22 DIAGNOSIS — R55 Syncope and collapse: Secondary | ICD-10-CM | POA: Diagnosis not present

## 2023-08-22 DIAGNOSIS — Z8673 Personal history of transient ischemic attack (TIA), and cerebral infarction without residual deficits: Secondary | ICD-10-CM | POA: Insufficient documentation

## 2023-08-22 DIAGNOSIS — K760 Fatty (change of) liver, not elsewhere classified: Secondary | ICD-10-CM | POA: Diagnosis not present

## 2023-08-22 DIAGNOSIS — I43 Cardiomyopathy in diseases classified elsewhere: Secondary | ICD-10-CM

## 2023-08-22 LAB — CBC WITH DIFFERENTIAL/PLATELET
Abs Immature Granulocytes: 0.01 10*3/uL (ref 0.00–0.07)
Basophils Absolute: 0 10*3/uL (ref 0.0–0.1)
Basophils Relative: 0 %
Eosinophils Absolute: 0 10*3/uL (ref 0.0–0.5)
Eosinophils Relative: 1 %
HCT: 46.6 % (ref 39.0–52.0)
Hemoglobin: 15.3 g/dL (ref 13.0–17.0)
Immature Granulocytes: 0 %
Lymphocytes Relative: 31 %
Lymphs Abs: 2.3 10*3/uL (ref 0.7–4.0)
MCH: 31.1 pg (ref 26.0–34.0)
MCHC: 32.8 g/dL (ref 30.0–36.0)
MCV: 94.7 fL (ref 80.0–100.0)
Monocytes Absolute: 0.5 10*3/uL (ref 0.1–1.0)
Monocytes Relative: 6 %
Neutro Abs: 4.6 10*3/uL (ref 1.7–7.7)
Neutrophils Relative %: 62 %
Platelets: 188 10*3/uL (ref 150–400)
RBC: 4.92 MIL/uL (ref 4.22–5.81)
RDW: 13.4 % (ref 11.5–15.5)
WBC: 7.4 10*3/uL (ref 4.0–10.5)
nRBC: 0 % (ref 0.0–0.2)

## 2023-08-22 LAB — ECHOCARDIOGRAM LIMITED
Area-P 1/2: 4.06 cm2
Height: 72 in
S' Lateral: 4.1 cm
Weight: 4288 [oz_av]

## 2023-08-22 LAB — PROTIME-INR
INR: 1.1 (ref 0.8–1.2)
Prothrombin Time: 14.4 s (ref 11.4–15.2)

## 2023-08-22 LAB — COMPREHENSIVE METABOLIC PANEL
ALT: 63 U/L — ABNORMAL HIGH (ref 0–44)
AST: 77 U/L — ABNORMAL HIGH (ref 15–41)
Albumin: 1.5 g/dL — ABNORMAL LOW (ref 3.5–5.0)
Alkaline Phosphatase: 54 U/L (ref 38–126)
Anion gap: 7 (ref 5–15)
BUN: 12 mg/dL (ref 6–20)
CO2: 27 mmol/L (ref 22–32)
Calcium: 7.7 mg/dL — ABNORMAL LOW (ref 8.9–10.3)
Chloride: 104 mmol/L (ref 98–111)
Creatinine, Ser: 1.04 mg/dL (ref 0.61–1.24)
GFR, Estimated: >60 mL/min (ref >60)
Glucose, Bld: 93 mg/dL (ref 70–99)
Potassium: 3.6 mmol/L (ref 3.5–5.1)
Sodium: 138 mmol/L (ref 135–145)
Total Bilirubin: 0.4 mg/dL (ref 0.0–1.2)
Total Protein: 4.1 g/dL — ABNORMAL LOW (ref 6.5–8.1)

## 2023-08-22 LAB — I-STAT CG4 LACTIC ACID, ED
Lactic Acid, Venous: 1.2 mmol/L (ref 0.5–1.9)
Lactic Acid, Venous: 0.6 mmol/L (ref 0.5–1.9)

## 2023-08-22 LAB — TROPONIN I (HIGH SENSITIVITY)
Troponin I (High Sensitivity): 51 ng/L — ABNORMAL HIGH (ref <18)
Troponin I (High Sensitivity): 51 ng/L — ABNORMAL HIGH (ref <18)

## 2023-08-22 LAB — APTT: aPTT: 27 s (ref 24–36)

## 2023-08-22 MED ORDER — OXYCODONE HCL 5 MG PO TABS
10.0000 mg | ORAL_TABLET | Freq: Four times a day (QID) | ORAL | Status: DC | PRN
  Administered 2023-08-23 (×2): 10 mg via ORAL
  Filled 2023-08-22 (×3): qty 2

## 2023-08-22 MED ORDER — BUSPIRONE HCL 5 MG PO TABS
15.0000 mg | ORAL_TABLET | Freq: Two times a day (BID) | ORAL | Status: DC
  Administered 2023-08-22 – 2023-08-23 (×2): 15 mg via ORAL
  Filled 2023-08-22 (×2): qty 2

## 2023-08-22 MED ORDER — MIDODRINE HCL 2.5 MG PO TABS
5.0000 mg | ORAL_TABLET | Freq: Three times a day (TID) | ORAL | Status: DC
  Filled 2023-08-22 (×2): qty 2

## 2023-08-22 MED ORDER — LACTATED RINGERS IV BOLUS (SEPSIS)
1000.0000 mL | Freq: Once | INTRAVENOUS | Status: AC
  Administered 2023-08-22: 1000 mL via INTRAVENOUS

## 2023-08-22 MED ORDER — POLYETHYLENE GLYCOL 3350 17 G PO PACK
17.0000 g | PACK | Freq: Every day | ORAL | Status: DC
  Administered 2023-08-23: 17 g via ORAL
  Filled 2023-08-22: qty 1

## 2023-08-22 MED ORDER — SODIUM CHLORIDE 0.9 % IV BOLUS: 1000.0000 mL | Freq: Once | INTRAVENOUS | Status: DC

## 2023-08-22 MED ORDER — DOXYLAMINE SUCCINATE (SLEEP) 25 MG PO TABS
25.0000 mg | ORAL_TABLET | Freq: Every evening | ORAL | Status: DC | PRN
Start: 2023-08-22 — End: 2023-08-23
  Administered 2023-08-22: 25 mg via ORAL
  Filled 2023-08-22: qty 1

## 2023-08-22 MED ORDER — LEVOTHYROXINE SODIUM 100 MCG PO TABS
200.0000 ug | ORAL_TABLET | Freq: Every day | ORAL | Status: DC
  Administered 2023-08-23: 200 ug via ORAL
  Filled 2023-08-22: qty 2

## 2023-08-22 MED ORDER — ASPIRIN 81 MG PO TBEC
81.0000 mg | DELAYED_RELEASE_TABLET | Freq: Every day | ORAL | Status: DC
  Administered 2023-08-23: 81 mg via ORAL
  Filled 2023-08-22: qty 1

## 2023-08-22 MED ORDER — CLOPIDOGREL BISULFATE 75 MG PO TABS
75.0000 mg | ORAL_TABLET | Freq: Every day | ORAL | Status: DC
  Administered 2023-08-23: 75 mg via ORAL
  Filled 2023-08-22: qty 1

## 2023-08-22 NOTE — ED Provider Triage Note (Signed)
 Emergency Medicine Provider Triage Evaluation Note  Joseph [name]. , a 58 y.o. male  was evaluated in triage.  Pt complains of hypotension and near syncope. Sent from direct admit for hypotension hx fo myomectomy for HOCM at [facility] in Nov, recent dx of amyloidosis. 24 hours of near syncope and hypotension all BP meds recently stopped. Received 500 of fluid pt arrival. Cardiolog  Review of Systems  Positive: Hypotension  Negative: fever  Physical Exam  BP (!) 91/58   Pulse 73   Temp (!) 97.4 F (36.3 C)   Resp 18   Ht 6' (1.829 m)   Wt 121.6 kg   SpO2 97%   BMI 36.35 kg/m  Gen:   Awake, no distress   Resp:  Normal effort  MSK:   Moves extremities without difficulty  Other:    Medical Decision Making  Medically screening exam initiated at 12:29 PM.  Appropriate orders placed.  Joseph [name]. was informed that the remainder of the evaluation will be completed by another provider, this initial triage assessment does not replace that evaluation, and the importance of remaining in the ED until their evaluation is complete.  Sent from admissions due to hypotension for stablilization   Arthor Captain, PA-C 08/22/23 1234

## 2023-08-22 NOTE — Progress Notes (Signed)
  Echocardiogram 2D Echocardiogram has been performed.  Delcie Roch 08/22/2023, 5:30 PM

## 2023-08-22 NOTE — Progress Notes (Signed)
 Cardiology Office Note:  .    Date:  08/22/2023  ID:  Joseph Barter., DOB 1965-07-12, MRN [id number] PCP: Richardean Chimera, MD  [facility] Providers Cardiologist:  Tonny Bollman, MD     CC: Follow up HCM and Amyloidosis care  History of Present Illness: .    Joseph Abdou. is a 58 y.o. male with a hx of  CAD with prior PCI since 2011. Chronic lower back pain.  Complicated by oHCM. Medically managed in 2023-> SRT in [address]. Discussed the use of AI scribe software for clinical note transcription with the patient, who gave verbal consent to proceed.  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 He is accompanied by his daughter, Joseph [name].  He has been experiencing symptomatic hypotension, particularly during cardiac rehabilitation, which limits his ability to participate in therapy. Blood pressure readings have been as low as 76/44 mmHg, causing dizziness and weakness, almost to the point of syncope. He feels cold and has a decreased appetite, with intake varying day to day. No fever or chills.  He has a diagnosis of non-AL, non-ATTR cardiac amyloidosis, which may contribute to his low blood pressure. He experiences orthostatic hypotension, with significant blood pressure variability. He also has hoarseness and difficulty swallowing, with intermittent episodes of choking while eating and hoarseness lasting several days.  He has a history of hypertrophic obstructive cardiomyopathy, for which he underwent a septal myectomy at [facility]. This procedure was complicated by postoperative atrial fibrillation and a stroke. He also has obstructive coronary artery disease, for which he had a bypass at the time of his septal myectomy.  His medication regimen has been adjusted due to low blood pressure. He is no longer on metoprolol, and amiodarone has been discontinued. He is currently on a very low  dose of Lasix. Recent labs were normal.  His daughter, Joseph [name], is concerned about the possibility of another stroke, as he has been quieter since his previous stroke and has been experiencing similar symptoms.   Relevant histories: .  Social  2024: tried CMI therapy for one week: he felt that it was affecting his oxycodone. He has called in lately with low blood pressure.  Summer he called in with near syncopal events: no NSVT on heart monitor.  Had Myectomy 04/2023. 2025: Residual issues from his [facility] Stroke  ROS: As per HPI.   Studies Reviewed: .   Cardiac Studies & Procedures   ______________________________________________________________________________________________ CARDIAC CATHETERIZATION  CARDIAC CATHETERIZATION 08/15/2022  Narrative 1.  Moderate nonobstructive proximal RCA stenosis of 50 to 60%, RFR evaluation performed and is equal to 0.95 2.  Patent left main, LAD, and left circumflex with mild in-stent restenosis in the mid LAD stent, no obstruction in the left main or circumflex 3.  Essentially normal right heart pressures, normal LVEDP of 14 mmHg, mean PA pressure 22 mmHg, RA pressure mean of 5.  Cardiac output 6.5, cardiac index 2.7. 4.  No significant pullback gradient from the mid left ventricle to the aorta with a peak gradient of 5 mmHg  Recommend: Medical therapy for nonobstructive CAD, continue medical therapy for potential hypertrophic cardiomyopathy.  Refer to HCM specialist for discussion of further management options.  Of note, patient had a large gradient by echo assessment not demonstrated by catheter pullback.  Findings Coronary Findings Diagnostic  Dominance: Right  Left Main The vessel exhibits minimal luminal irregularities. Mid LM lesion is 25% stenosed.  The lesion is eccentric. The lesion is mildly calcified.  Left Anterior Descending There is mild diffuse disease throughout the vessel. Prox LAD to Mid LAD lesion is 40% stenosed. The  lesion was previously treated over 2 years ago.  Left Circumflex The vessel exhibits minimal luminal irregularities.  Right Coronary Artery Vessel is large. There is mild diffuse disease throughout the vessel. Prox RCA lesion is 60% stenosed. The lesion is eccentric. The lesion is moderately calcified.  Intervention  No interventions have been documented.     ECHOCARDIOGRAM  ECHOCARDIOGRAM COMPLETE 08/15/2023  Narrative ECHOCARDIOGRAM REPORT    Patient Name:   Joseph [name]. Date of Exam: 08/15/2023 Medical Rec #:  [id number]         Height:       72.0 in Accession #:    [id number]        Weight:       270.0 lb Date of Birth:  [birth year]          BSA:          2.420 m Patient Age:    58 years          BP:           82/62 mmHg Patient Gender: M                 HR:           73 bpm. Exam Location:  [address]  Procedure: 2D Echo, Cardiac Doppler and Color Doppler (Both Spectral and Color Flow Doppler were utilized during procedure).  Indications:    I42.1 Hypertrophic obstructive cardiomyopathy  History:        Patient has prior history of Echocardiogram examinations, most recent 06/16/2023. Cardiac amyloidosis, CAD, Prior CABG, TIA, Arrythmias:Atrial Fibrillation; Risk Factors:Hypertension and Sleep Apnea. Myectomy (November 2024).  Sonographer:    Clearence Ped RCS Referring Phys: 20 TESSA N CONTE  IMPRESSIONS   1. Left ventricular ejection fraction, by estimation, is 50 to 55%. The left ventricle has low normal function. The left ventricle has no regional wall motion abnormalities. There is moderate concentric left ventricular hypertrophy. Left ventricular diastolic parameters are consistent with Grade I diastolic dysfunction (impaired relaxation). 2. Right ventricular systolic function is normal. The right ventricular size is normal. Tricuspid regurgitation signal is inadequate for assessing PA pressure. 3. Left atrial size was mildly dilated. 4. The mitral  valve is normal in structure. No evidence of mitral valve regurgitation. 5. The aortic valve is tricuspid. Aortic valve regurgitation is not visualized. Aortic valve sclerosis/calcification is present, without any evidence of aortic stenosis. 6. The inferior vena cava is normal in size with greater than 50% respiratory variability, suggesting right atrial pressure of 3 mmHg.  FINDINGS Left Ventricle: Left ventricular ejection fraction, by estimation, is 50 to 55%. The left ventricle has low normal function. The left ventricle has no regional wall motion abnormalities. Strain imaging was not performed. The left ventricular internal cavity size was normal in size. There is moderate concentric left ventricular hypertrophy. Abnormal (paradoxical) septal motion, consistent with left bundle branch block. Left ventricular diastolic parameters are consistent with Grade I diastolic dysfunction (impaired relaxation). Normal left ventricular filling pressure.  Right Ventricle: The right ventricular size is normal. No increase in right ventricular wall thickness. Right ventricular systolic function is normal. Tricuspid regurgitation signal is inadequate for assessing PA pressure.  Left Atrium: Left atrial size was mildly dilated.  Right Atrium: Right atrial size was normal in size.  Pericardium:  There is no evidence of pericardial effusion.  Mitral Valve: The mitral valve is normal in structure. No evidence of mitral valve regurgitation.  Tricuspid Valve: The tricuspid valve is normal in structure. Tricuspid valve regurgitation is not demonstrated.  Aortic Valve: The aortic valve is tricuspid. Aortic valve regurgitation is not visualized. Aortic valve sclerosis/calcification is present, without any evidence of aortic stenosis.  Pulmonic Valve: The pulmonic valve was grossly normal. Pulmonic valve regurgitation is not visualized. No evidence of pulmonic stenosis.  Aorta: The aortic root and ascending  aorta are structurally normal, with no evidence of dilitation.  Venous: The inferior vena cava is normal in size with greater than 50% respiratory variability, suggesting right atrial pressure of 3 mmHg.  IAS/Shunts: No atrial level shunt detected by color flow Doppler.  Additional Comments: 3D imaging was not performed.   LEFT VENTRICLE PLAX 2D LVIDd:         4.20 cm   Diastology LVIDs:         3.00 cm   LV e' medial:    8.92 cm/s LV PW:         1.50 cm   LV E/e' medial:  6.3 LV IVS:        1.30 cm   LV e' lateral:   5.00 cm/s LVOT diam:     2.20 cm   LV E/e' lateral: 11.3 LV SV:         94 LV SV Index:   39 LVOT Area:     3.80 cm   RIGHT VENTRICLE RV Basal diam:  3.10 cm RV S prime:     7.18 cm/s TAPSE (M-mode): 1.4 cm  LEFT ATRIUM             Index        RIGHT ATRIUM           Index LA diam:        4.50 cm 1.86 cm/m   RA Area:     12.40 cm LA Vol (A2C):   43.2 ml 17.85 ml/m  RA Volume:   26.40 ml  10.91 ml/m LA Vol (A4C):   55.3 ml 22.85 ml/m LA Biplane Vol: 49.8 ml 20.58 ml/m AORTIC VALVE LVOT Vmax:   137.00 cm/s LVOT Vmean:  86.500 cm/s LVOT VTI:    0.246 m  AORTA Ao Root diam: 3.80 cm Ao Asc diam:  3.60 cm  MITRAL VALVE MV Area (PHT): 5.23 cm    SHUNTS MV Decel Time: 145 msec    Systemic VTI:  0.25 m MV E velocity: 56.60 cm/s  Systemic Diam: 2.20 cm MV A velocity: 72.30 cm/s MV E/A ratio:  0.78  Mihai Croitoru MD Electronically signed by Thurmon Fair MD Signature Date/Time: 08/15/2023/1:25:44 PM    Final   TEE  ECHO TEE 04/14/2023  Narrative TRANSESOPHOGEAL ECHO REPORT    Patient Name:   Joseph [name]. Date of Exam: 04/14/2023 Medical Rec #:  [id number]         Height:       72.0 in Accession #:    [id number]        Weight:       270.0 lb Date of Birth:  [birth year]          BSA:          2.420 m Patient Age:    57 years          BP:           117/68  mmHg Patient Gender: M                 HR:           52 bpm. Exam Location:   Inpatient  Procedure: Transesophageal Echo, Color Doppler, Cardiac Doppler and 3D Echo  MODIFIED REPORT: This report was modified by Riley Lam MD on 04/17/2023 due to Spelling. Indications:     Hypertrophic Cardiomyopathy  History:         Patient has prior history of Echocardiogram examinations, most recent 06/28/2022. CAD, Thyroid Disease; Risk Factors:Hypertension, Sleep Apnea and Dyslipidemia.  Sonographer:     Milbert Coulter Referring Phys:  [id number] [facility] Richardean Chimera Diagnosing Phys: Riley Lam MD   Sonographer Comments: TEE probe [phone] utilized for procedure.   PROCEDURE: After discussion of the risks and benefits of a TEE, an informed consent was obtained from the patient. The transesophogeal probe was passed without difficulty through the esophogus of the patient. Imaged were obtained with the patient in a left lateral decubitus position. Sedation performed by different physician. The patient was monitored while under deep sedation. Anesthestetic sedation was provided intravenously by Anesthesiology: 449.35mg  of Propofol, 100mg  of Lidocaine. Image quality was good. The patient's vital signs; including heart rate, blood pressure, and oxygen saturation; remained stable throughout the procedure. Supplementary images were obtained from transthoracic windows as indicated to answer the clinical question. The patient developed no complications during the procedure.  IMPRESSIONS   1. Fasting Valsalva LVOT gradient 29 mmHg. Maximal septal thickness 24 mm, basal septum. Left ventricular ejection fraction, by estimation, is 60 to 65%. The left ventricle has normal function. There is severe asymmetric left ventricular hypertrophy of the septal segment. 2. Right ventricular systolic function is normal. The right ventricular size is normal. 3. Left atrial size was mildly dilated. No left atrial/left atrial appendage thrombus was detected. 4. Mild regurgitation not  related to systolic anterior motion of the mitral valve, but related to elongated papillary muscle; 3D asessement confirms central, A2-P2 jet. The mitral valve is abnormal. Mild mitral valve regurgitation. No evidence of mitral stenosis. 5. The aortic valve is tricuspid. Aortic valve regurgitation is not visualized. Aortic valve sclerosis is present, with no evidence of aortic valve stenosis. 6. Cannot exclude a small PFO.  FINDINGS Left Ventricle: Fasting Valsalva LVOT gradient 29 mmHg. Maximal septal thickness 24 mm, basal septum. Left ventricular ejection fraction, by estimation, is 60 to 65%. The left ventricle has normal function. The left ventricular internal cavity size was normal in size. There is severe asymmetric left ventricular hypertrophy of the septal segment.  Right Ventricle: The right ventricular size is normal. No increase in right ventricular wall thickness. Right ventricular systolic function is normal.  Left Atrium: Left atrial size was mildly dilated. No left atrial/left atrial appendage thrombus was detected.  Right Atrium: Right atrial size was normal in size.  Pericardium: There is no evidence of pericardial effusion.  Mitral Valve: Mild regurgitation not related to systolic anterior motion of the mitral valve, but related to elongated papillary muscle; 3D asessement confirms central, A2-P2 jet. The mitral valve is abnormal. Mild mitral valve regurgitation. No evidence of mitral valve stenosis.  Tricuspid Valve: The tricuspid valve is normal in structure. Tricuspid valve regurgitation is not demonstrated. No evidence of tricuspid stenosis.  Aortic Valve: The aortic valve is tricuspid. Aortic valve regurgitation is not visualized. Aortic valve sclerosis is present, with no evidence of aortic valve stenosis. Aortic valve mean gradient measures 6.0 mmHg. Aortic valve peak  gradient measures 12.4 mmHg.  Pulmonic Valve: The pulmonic valve was normal in structure. Pulmonic  valve regurgitation is trivial. No evidence of pulmonic stenosis.  Aorta: The aortic root, ascending aorta, aortic arch and descending aorta are all structurally normal, with no evidence of dilitation or obstruction.  Pulmonary Artery: The pulmonary artery is of normal size.  IAS/Shunts: Cannot exclude a small PFO.  Additional Comments: Spectral Doppler performed.  LEFT VENTRICLE PLAX 2D LVIDd:         4.00 cm LV PW:         1.90 cm LV IVS:        2.40 cm   AORTIC VALVE AV Vmax:           176.00 cm/s AV Vmean:          113.000 cm/s AV VTI:            0.340 m AV Peak Grad:      12.4 mmHg AV Mean Grad:      6.0 mmHg LVOT Vmax:         167.00 cm/s LVOT Vmean:        102.000 cm/s LVOT VTI:          0.337 m LVOT/AV VTI ratio: 0.99   SHUNTS Systemic VTI: 0.34 m  Riley Lam MD Electronically signed by Riley Lam MD Signature Date/Time: 04/14/2023/5:08:27 PM    Final (Updated)  MONITORS  CARDIAC EVENT MONITOR 11/24/2022  Narrative   Patient had a minimum heart rate of 40 bpm (nocturnal), maximum heart rate of 120 bpm, and average heart rate of 62 bpm.   Predominant underlying rhythm was sinus rhythm.   No NSVT. No atrial fibrillation or flutter.   Isolated PACs were rare (<1.0%).   Isolated PVCs were rare (<1.0%).   First degree heart block noted.   Triggered and diary events associated with sinus rhythm.  No malignant arrhythmias     CARDIAC MRI  MR CARDIAC MORPHOLOGY W WO CONTRAST 09/14/2022  Narrative CLINICAL DATA:  HCM  EXAM: CARDIAC MRI  TECHNIQUE: The patient was scanned on a 1.5 Tesla Siemens magnet. A dedicated cardiac coil was used. Functional imaging was done using Fiesta sequences. 2,3, and 4 chamber views were done to assess for RWMA's. Modified Simpson's rule using a short axis stack was used to calculate an ejection fraction on a dedicated work [employer]. The patient received 15 cc of Gadavist. After  10 minutes inversion recovery sequences were used to assess for infiltration and scar tissue. Velocity flow mapping performed in the ascending aorta and main pulmonary artery.  CONTRAST:  15 cc  of Gadavist  FINDINGS: 1. Normal left ventricular cavity size, severe asymmetric basal septal hypertrophy. LV septal wall measures upto 18mm in thickness.  Normal LV systolic function (LVEF = 62%).  There is systolic anterior motion (SAM) of mitral valve causing LV outflow tract obstruction.  There are no regional wall motion abnormalities.  There is Midwall scar/LGE in the lateral LV wall, mid to apical segments.  LGE/scar comprises 9 g, about 4% of total LV myocardial mass.  LVEDV: 198 ml  LVESV: 76 ml  SV: 122 ml  CO:4 L/min  Myocardial mass: 242g  2. Normal right ventricular size, thickness and systolic function (RVEF = 54%). There are no regional wall motion abnormalities.  3.  Normal left and right atrial size.  4. Normal size of the aortic root, ascending aorta and pulmonary artery.  5.  Mild mitral regurgitation.  6.  Normal pericardium.  trivial pericardial effusion.  IMPRESSION: 1. Normal LV function.  LVEF 62%  2. Severe asymmetric LV basal septal hypertrophy, measuring upto 18 mm in maximal thickness.  3. There is systolic anterior motion (SAM) of mitral valve causing LV outflow tract obstruction.  4. Midwall scar/LGE in the lateral LV wall, mid to apical segments.  5. LGE/scar comprises 9 g, about 4% of total LV myocardial mass.  6. Normal RV size and function.  7. 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   Electronically Signed By: Debbe Odea M.D. On: 09/14/2022 13:55  PYP SCAN  MYOCARDIAL AMYLOID PLANAR AND SPECT 06/09/2023  Narrative   Myocardial uptake was negative for radiotracer uptake. The visual grade of myocardial uptake relative to the ribs was Grade 0 (No myocardial uptake and normal bone  uptake).   Findings are not suggestive (Grade 0) of cardiac ATTR amyloidosis.   Prior study not available for comparison.   H/CL ratio < 1.5 measuring 1.37 with background correction  ______________________________________________________________________________________________       Physical Exam:    VS:  BP (!) 60/40 (BP Location: Left Arm)   Pulse (!) 101   Ht 6' (1.829 m)   Wt 268 lb 6.4 oz (121.7 kg)   SpO2 92%   BMI 36.40 kg/m    Wt Readings from Last 3 Encounters:  08/22/23 268 lb 6.4 oz (121.7 kg)  08/21/23 268 lb 9.6 oz (121.8 kg)  07/20/23 270 lb (122.5 kg)    Gen: Acute distress Cardiac: No Rubs or Gallops, no Murmur, regular tachycardia +2 radial pulses Respiratory: Clear to auscultation bilaterally, normal effort, normal  respiratory rate GI: Soft, nontender, non-distended  MS: No  edema;  moves all extremities Integument: Skin feels cool and clammy Neuro:  At time of evaluation, alert and oriented to person/place/time/situation   ASSESSMENT AND PLAN: .    Symptomatic Hypotension Experiencing symptomatic hypotension, likely related to cardiac amyloidosis, with severe dizziness and near syncope. Systolic blood pressure readings as low as 76/44 mmHg (repeat). Orthostatic hypotension may be a contributing factor. Difficulty participating in cardiac rehab due to low blood pressure. Risks include hypoperfusion and potential stroke, though unlikely unless persistent and severe. Midodrine chosen over fludrocortisone to avoid edema. Recommended ED and EMS call (patient refused) Direct admitted; called hospital he will get the first available bed - Administered oral fluids  - I placed an [place] 20 G R AC IV - Start midodrine 5 mg - Offer direct hospital admission for further management if symptoms persist. - WBC, Blood clutures, BMP - we had stopped all anti-hypertensive's - he has not taken his diuretic  Cardiac Amyloidosis Non-AL, non-ATTR cardiac amyloidosis may  contribute to symptomatic hypotension and affect multiple systems, including causing hoarseness and dysphagia. Acknowledged complexity and lack of clinical trials for this specific type. - Refer to Dr. Shirlee Latch, an advanced heart failure specialist, or Dr. Kai Levins at [facility] for further evaluation after hospitalization  Atrial Fibrillation Atrial fibrillation post-septal myectomy. Currently off amiodarone and metoprolol. Plan to monitor for recurrence post-washout of amiodarone. Consideration of ILR implantation if atrial fibrillation is suspected to monitor for stroke risk. - AT DC either ILR or preventice monitor for stroke eval  History of Stroke Stroke post-septal myectomy. Concern about recurrence, but current symptoms do not indicate a new stroke. On Plavix for stroke prevention. Consideration of ILR implantation if atrial fibrillation is suspected to monitor for stroke risk. - continue DAPT  Coronary Artery Disease Obstructive coronary artery disease  addressed with bypass surgery at time of septal myectomy. No current concern for obstructive coronary artery disease.  Hypertrophic Obstructive Cardiomyopathy Hypertrophic obstructive cardiomyopathy status post-septal myectomy. Myectomy complicated by postoperative atrial fibrillation and stroke. Cardiac thickness addressed by surgery. No residual Echo   Riley Lam, MD FASE [facility] Cardiologist [facility]  [address] [place], [address] [place], [place] [address] [phone]  10:17 AM  Given 500 CC fluid Given 1 L of PO fluid.  Feels better. Less Clammy.  Giving additional fluid.  Direct admitted heading to bed.   Riley Lam, MD FASE [facility] Cardiologist [facility]  [address] [place], [address] [place], [place] [address] [phone]  10:17 AM  Color has improved: still at 77/49  I have given him all of the IV fluid in our office.  Given 1 L + PO fluid. BP 74/54. Color has improved  I recommend ED  evaluation via EMS (refused) - Stable IV access.  Called to admission; patient should go to the hospital.  Riley Lam, MD FASE [facility] Cardiologist [facility]  [facility]  [address] [place], [address] [place], [place] [address] [phone]  11:20 AM  He is now amenable to ED evaluation. Patient going to ED  Riley Lam, MD FASE [facility] Cardiologist [facility]  [address] [place], [address] [place], [place] [address] [phone]  11:27 AM   Time Spent Directly with Patient:   I have spent a total of 93 minutes with the patient reviewing notes, imaging, EKGs, labs, and examining the patient as well as establishing an assessment and plan that was discussed personally with the patient. Placed an US guided IV.  Discussed EV evaluation.  Called ED, inpatient team.  Discussed with wife and daughter.  Direct admission.  Full code CBC ED Blood Cultures DVT PPX.  See inpatient note.  Riley Lam, MD FASE [facility] Cardiologist [facility]  [address] [place], [address] [place], [place] [address] [phone]  11:37 AM

## 2023-08-22 NOTE — Patient Instructions (Signed)
 Medication Instructions:  Your physician has recommended you make the following change in your medication:  STOP all BP meds  *If you need a refill on your cardiac medications before your next appointment, please call your pharmacy*   Lab Work: NONE  If you have labs (blood work) drawn today and your tests are completely normal, you will receive your results only by: MyChart Message (if you have MyChart) OR A paper copy in the mail If you have any lab test that is abnormal or we need to change your treatment, we will call you to review the results.   Testing/Procedures: NONE   Follow-Up:to be determined At [facility], you and your health needs are our priority.  As part of our continuing mission to provide you with exceptional heart care, we have created designated Provider Care Teams.  These Care Teams include your primary Cardiologist (physician) and Advanced Practice Providers (APPs -  Physician Assistants and Nurse Practitioners) who all work together to provide you with the care you need, when you need it.   Provider:   Riley Lam, MD  Other Instructions Report to the Emergency Room for evaluation of low BP.

## 2023-08-22 NOTE — Progress Notes (Signed)
   Asked to follow up on patient regarding admission from the office. At the time of assessment he continues to report feeling weak and "washed out". Given 1L IVF in the ED with pressures maintaining in the 90-100 range systolic.  Denies any infectious symptoms, wife had the flu about 3 weeks ago but he never got it Labs notable for albumin 1.5, AST 77, ALT 63. Lactic acid 1.2>>0.6. No hx of cirrhosis. Does have LE edema, but reports this is unchanged from his normal -- admit orders placed, will check echo, RUQ [place] -- hold on midodrine for now as BP seems to have improved  Discussed pt with DOD  Signed, Laverda Page, NP-C 08/22/2023, 4:24 PM Pager: [phone]
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
## 2023-08-22 NOTE — Addendum Note (Signed)
 Addended by: Macie Burows on: 08/22/2023 11:44 AM   Modules accepted: Orders

## 2023-08-22 NOTE — ED Triage Notes (Signed)
 Pt had CABG in January and states that since then, he has had problems. Pt states he has not been feeling well and felt dizzy. Pt went to MD office today and was told to come to the ED for admission. Pts BP in triage 70/50.  Pt received approximately in office. Pt has had ongoing hypotension, stopped metoprolol 10 days ago.

## 2023-08-22 NOTE — Addendum Note (Signed)
 Addended by: Oretha Milch on: 08/22/2023 09:05 AM   Modules accepted: Orders

## 2023-08-22 NOTE — H&P (Signed)
 Cardiology Office Note:  .    Date:  08/22/2023  ID:  Joseph [name]., DOB 1966/03/16, MRN [id number] PCP: Richardean Chimera, MD  [facility] Providers Cardiologist:  Tonny Bollman, MD    CC: Follow up HCM and Amyloidosis care  History of Present Illness: .    Joseph [name]. is a 58 y.o. male with a hx of  CAD with prior PCI since 2011. Chronic lower back pain.  Complicated by oHCM. Medically managed in 2023-> SRT in 295[address]. Discussed the use of AI scribe software for clinical note transcription with the patient, who gave verbal consent to proceed.  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 He is accompanied by his daughter, Joseph [name].  He has been experiencing symptomatic hypotension, particularly during cardiac rehabilitation, which limits his ability to participate in therapy. Blood pressure readings have been as low as 76/44 mmHg, causing dizziness and weakness, almost to the point of syncope. He feels cold and has a decreased appetite, with intake varying day to day. No fever or chills.  He has a diagnosis of non-AL, non-ATTR cardiac amyloidosis, which may contribute to his low blood pressure. He experiences orthostatic hypotension, with significant blood pressure variability. He also has hoarseness and difficulty swallowing, with intermittent episodes of choking while eating and hoarseness lasting several days.  He has a history of hypertrophic obstructive cardiomyopathy, for which he underwent a septal myectomy at [facility]. This procedure was complicated by postoperative atrial fibrillation and a stroke. He also has obstructive coronary artery disease, for which he had a bypass at the time of his septal myectomy.  His medication regimen has been adjusted due to low blood pressure. He is no longer on metoprolol, and amiodarone has been discontinued. He is currently on a very low  dose of Lasix. Recent labs were normal.  His daughter, Joseph [name], is concerned about the possibility of another stroke, as he has been quieter since his previous stroke and has been experiencing similar symptoms.   Relevant histories: .  Social  2024: tried CMI therapy for one week: he felt that it was affecting his oxycodone. He has called in lately with low blood pressure.  Summer he called in with near syncopal events: no NSVT on heart monitor.  Had Myectomy 04/2023. 2025: Residual issues from his [facility] Stroke  ROS: As per HPI.   Studies Reviewed: .   Cardiac Studies & Procedures   ______________________________________________________________________________________________ CARDIAC CATHETERIZATION  CARDIAC CATHETERIZATION 08/15/2022  Narrative 1.  Moderate nonobstructive proximal RCA stenosis of 50 to 60%, RFR evaluation performed and is equal to 0.95 2.  Patent left main, LAD, and left circumflex with mild in-stent restenosis in the mid LAD stent, no obstruction in the left main or circumflex 3.  Essentially normal right heart pressures, normal LVEDP of 14 mmHg, mean PA pressure 22 mmHg, RA pressure mean of 5.  Cardiac output 6.5, cardiac index 2.7. 4.  No significant pullback gradient from the mid left ventricle to the aorta with a peak gradient of 5 mmHg  Recommend: Medical therapy for nonobstructive CAD, continue medical therapy for potential hypertrophic cardiomyopathy.  Refer to HCM specialist for discussion of further management options.  Of note, patient had a large gradient by echo assessment not demonstrated by catheter pullback.  Findings Coronary Findings Diagnostic  Dominance: Right  Left Main The vessel exhibits minimal luminal irregularities. Mid LM lesion is 25% stenosed. The  lesion is eccentric. The lesion is mildly calcified.  Left Anterior Descending There is mild diffuse disease throughout the vessel. Prox LAD to Mid LAD lesion is 40% stenosed. The  lesion was previously treated over 2 years ago.  Left Circumflex The vessel exhibits minimal luminal irregularities.  Right Coronary Artery Vessel is large. There is mild diffuse disease throughout the vessel. Prox RCA lesion is 60% stenosed. The lesion is eccentric. The lesion is moderately calcified.  Intervention  No interventions have been documented.     ECHOCARDIOGRAM  ECHOCARDIOGRAM COMPLETE 08/15/2023  Narrative ECHOCARDIOGRAM REPORT    Patient Name:   Joseph [name]. Date of Exam: 08/15/2023 Medical Rec #:  [id number]         Height:       72.0 in Accession #:    [id number]        Weight:       270.0 lb Date of Birth:  10/15/1965          BSA:          2.420 m Patient Age:    58 years          BP:           82/62 mmHg Patient Gender: M                 HR:           73 bpm. Exam Location:  [address]  Procedure: 2D Echo, Cardiac Doppler and Color Doppler (Both Spectral and Color Flow Doppler were utilized during procedure).  Indications:    I42.1 Hypertrophic obstructive cardiomyopathy  History:        Patient has prior history of Echocardiogram examinations, most recent 06/16/2023. Cardiac amyloidosis, CAD, Prior CABG, TIA, Arrythmias:Atrial Fibrillation; Risk Factors:Hypertension and Sleep Apnea. Myectomy (November 2024).  Sonographer:    Clearence Ped RCS Referring Phys: [id number] TESSA N CONTE  IMPRESSIONS   1. Left ventricular ejection fraction, by estimation, is 50 to 55%. The left ventricle has low normal function. The left ventricle has no regional wall motion abnormalities. There is moderate concentric left ventricular hypertrophy. Left ventricular diastolic parameters are consistent with Grade I diastolic dysfunction (impaired relaxation). 2. Right ventricular systolic function is normal. The right ventricular size is normal. Tricuspid regurgitation signal is inadequate for assessing PA pressure. 3. Left atrial size was mildly dilated. 4. The mitral  valve is normal in structure. No evidence of mitral valve regurgitation. 5. The aortic valve is tricuspid. Aortic valve regurgitation is not visualized. Aortic valve sclerosis/calcification is present, without any evidence of aortic stenosis. 6. The inferior vena cava is normal in size with greater than 50% respiratory variability, suggesting right atrial pressure of 3 mmHg.  FINDINGS Left Ventricle: Left ventricular ejection fraction, by estimation, is 50 to 55%. The left ventricle has low normal function. The left ventricle has no regional wall motion abnormalities. Strain imaging was not performed. The left ventricular internal cavity size was normal in size. There is moderate concentric left ventricular hypertrophy. Abnormal (paradoxical) septal motion, consistent with left bundle branch block. Left ventricular diastolic parameters are consistent with Grade I diastolic dysfunction (impaired relaxation). Normal left ventricular filling pressure.  Right Ventricle: The right ventricular size is normal. No increase in right ventricular wall thickness. Right ventricular systolic function is normal. Tricuspid regurgitation signal is inadequate for assessing PA pressure.  Left Atrium: Left atrial size was mildly dilated.  Right Atrium: Right atrial size was normal in size.  Pericardium: There  is no evidence of pericardial effusion.  Mitral Valve: The mitral valve is normal in structure. No evidence of mitral valve regurgitation.  Tricuspid Valve: The tricuspid valve is normal in structure. Tricuspid valve regurgitation is not demonstrated.  Aortic Valve: The aortic valve is tricuspid. Aortic valve regurgitation is not visualized. Aortic valve sclerosis/calcification is present, without any evidence of aortic stenosis.  Pulmonic Valve: The pulmonic valve was grossly normal. Pulmonic valve regurgitation is not visualized. No evidence of pulmonic stenosis.  Aorta: The aortic root and ascending  aorta are structurally normal, with no evidence of dilitation.  Venous: The inferior vena cava is normal in size with greater than 50% respiratory variability, suggesting right atrial pressure of 3 mmHg.  IAS/Shunts: No atrial level shunt detected by color flow Doppler.  Additional Comments: 3D imaging was not performed.   LEFT VENTRICLE PLAX 2D LVIDd:         4.20 cm   Diastology LVIDs:         3.00 cm   LV e' medial:    8.92 cm/s LV PW:         1.50 cm   LV E/e' medial:  6.3 LV IVS:        1.30 cm   LV e' lateral:   5.00 cm/s LVOT diam:     2.20 cm   LV E/e' lateral: 11.3 LV SV:         94 LV SV Index:   39 LVOT Area:     3.80 cm   RIGHT VENTRICLE RV Basal diam:  3.10 cm RV S prime:     7.18 cm/s TAPSE (M-mode): 1.4 cm  LEFT ATRIUM             Index        RIGHT ATRIUM           Index LA diam:        4.50 cm 1.86 cm/m   RA Area:     12.40 cm LA Vol (A2C):   43.2 ml 17.85 ml/m  RA Volume:   26.40 ml  10.91 ml/m LA Vol (A4C):   55.3 ml 22.85 ml/m LA Biplane Vol: 49.8 ml 20.58 ml/m AORTIC VALVE LVOT Vmax:   137.00 cm/s LVOT Vmean:  86.500 cm/s LVOT VTI:    0.246 m  AORTA Ao Root diam: 3.80 cm Ao Asc diam:  3.60 cm  MITRAL VALVE MV Area (PHT): 5.23 cm    SHUNTS MV Decel Time: 145 msec    Systemic VTI:  0.25 m MV E velocity: 56.60 cm/s  Systemic Diam: 2.20 cm MV A velocity: 72.30 cm/s MV E/A ratio:  0.78  Mihai Croitoru MD Electronically signed by Thurmon Fair MD Signature Date/Time: 08/15/2023/1:25:44 PM    Final   TEE  ECHO TEE 04/14/2023  Narrative TRANSESOPHOGEAL ECHO REPORT    Patient Name:   Joseph Decesare. Date of Exam: 04/14/2023 Medical Rec #:  [id number]         Height:       72.0 in Accession #:    [id number]        Weight:       270.0 lb Date of Birth:  Jul 09, 1965          BSA:          2.420 m Patient Age:    57 years          BP:           117/68 mmHg  Patient Gender: M                 HR:           52 bpm. Exam Location:   Inpatient  Procedure: Transesophageal Echo, Color Doppler, Cardiac Doppler and 3D Echo  MODIFIED REPORT: This report was modified by Riley Lam MD on 04/17/2023 due to Spelling. Indications:     Hypertrophic Cardiomyopathy  History:         Patient has prior history of Echocardiogram examinations, most recent 06/28/2022. CAD, Thyroid Disease; Risk Factors:Hypertension, Sleep Apnea and Dyslipidemia.  Sonographer:     Milbert Coulter Referring Phys:  [id number] [facility] Richardean Chimera Diagnosing Phys: Riley Lam MD   Sonographer Comments: TEE probe [phone] utilized for procedure.   PROCEDURE: After discussion of the risks and benefits of a TEE, an informed consent was obtained from the patient. The transesophogeal probe was passed without difficulty through the esophogus of the patient. Imaged were obtained with the patient in a left lateral decubitus position. Sedation performed by different physician. The patient was monitored while under deep sedation. Anesthestetic sedation was provided intravenously by Anesthesiology: 449.35mg  of Propofol, 100mg  of Lidocaine. Image quality was good. The patient's vital signs; including heart rate, blood pressure, and oxygen saturation; remained stable throughout the procedure. Supplementary images were obtained from transthoracic windows as indicated to answer the clinical question. The patient developed no complications during the procedure.  IMPRESSIONS   1. Fasting Valsalva LVOT gradient 29 mmHg. Maximal septal thickness 24 mm, basal septum. Left ventricular ejection fraction, by estimation, is 60 to 65%. The left ventricle has normal function. There is severe asymmetric left ventricular hypertrophy of the septal segment. 2. Right ventricular systolic function is normal. The right ventricular size is normal. 3. Left atrial size was mildly dilated. No left atrial/left atrial appendage thrombus was detected. 4. Mild regurgitation not  related to systolic anterior motion of the mitral valve, but related to elongated papillary muscle; 3D asessement confirms central, A2-P2 jet. The mitral valve is abnormal. Mild mitral valve regurgitation. No evidence of mitral stenosis. 5. The aortic valve is tricuspid. Aortic valve regurgitation is not visualized. Aortic valve sclerosis is present, with no evidence of aortic valve stenosis. 6. Cannot exclude a small PFO.  FINDINGS Left Ventricle: Fasting Valsalva LVOT gradient 29 mmHg. Maximal septal thickness 24 mm, basal septum. Left ventricular ejection fraction, by estimation, is 60 to 65%. The left ventricle has normal function. The left ventricular internal cavity size was normal in size. There is severe asymmetric left ventricular hypertrophy of the septal segment.  Right Ventricle: The right ventricular size is normal. No increase in right ventricular wall thickness. Right ventricular systolic function is normal.  Left Atrium: Left atrial size was mildly dilated. No left atrial/left atrial appendage thrombus was detected.  Right Atrium: Right atrial size was normal in size.  Pericardium: There is no evidence of pericardial effusion.  Mitral Valve: Mild regurgitation not related to systolic anterior motion of the mitral valve, but related to elongated papillary muscle; 3D asessement confirms central, A2-P2 jet. The mitral valve is abnormal. Mild mitral valve regurgitation. No evidence of mitral valve stenosis.  Tricuspid Valve: The tricuspid valve is normal in structure. Tricuspid valve regurgitation is not demonstrated. No evidence of tricuspid stenosis.  Aortic Valve: The aortic valve is tricuspid. Aortic valve regurgitation is not visualized. Aortic valve sclerosis is present, with no evidence of aortic valve stenosis. Aortic valve mean gradient measures 6.0 mmHg. Aortic valve peak gradient  measures 12.4 mmHg.  Pulmonic Valve: The pulmonic valve was normal in structure. Pulmonic  valve regurgitation is trivial. No evidence of pulmonic stenosis.  Aorta: The aortic root, ascending aorta, aortic arch and descending aorta are all structurally normal, with no evidence of dilitation or obstruction.  Pulmonary Artery: The pulmonary artery is of normal size.  IAS/Shunts: Cannot exclude a small PFO.  Additional Comments: Spectral Doppler performed.  LEFT VENTRICLE PLAX 2D LVIDd:         4.00 cm LV PW:         1.90 cm LV IVS:        2.40 cm   AORTIC VALVE AV Vmax:           176.00 cm/s AV Vmean:          113.000 cm/s AV VTI:            0.340 m AV Peak Grad:      12.4 mmHg AV Mean Grad:      6.0 mmHg LVOT Vmax:         167.00 cm/s LVOT Vmean:        102.000 cm/s LVOT VTI:          0.337 m LVOT/AV VTI ratio: 0.99   SHUNTS Systemic VTI: 0.34 m  Riley Lam MD Electronically signed by Riley Lam MD Signature Date/Time: 04/14/2023/5:08:27 PM    Final (Updated)  MONITORS  CARDIAC EVENT MONITOR 11/24/2022  Narrative   Patient had a minimum heart rate of 40 bpm (nocturnal), maximum heart rate of 120 bpm, and average heart rate of 62 bpm.   Predominant underlying rhythm was sinus rhythm.   No NSVT. No atrial fibrillation or flutter.   Isolated PACs were rare (<1.0%).   Isolated PVCs were rare (<1.0%).   First degree heart block noted.   Triggered and diary events associated with sinus rhythm.  No malignant arrhythmias     CARDIAC MRI  MR CARDIAC MORPHOLOGY W WO CONTRAST 09/14/2022  Narrative CLINICAL DATA:  HCM  EXAM: CARDIAC MRI  TECHNIQUE: The patient was scanned on a 1.5 Tesla Siemens magnet. A dedicated cardiac coil was used. Functional imaging was done using Fiesta sequences. 2,3, and 4 chamber views were done to assess for RWMA's. Modified Simpson's rule using a short axis stack was used to calculate an ejection fraction on a dedicated work [employer]. The patient received 15 cc of Gadavist. After  10 minutes inversion recovery sequences were used to assess for infiltration and scar tissue. Velocity flow mapping performed in the ascending aorta and main pulmonary artery.  CONTRAST:  15 cc  of Gadavist  FINDINGS: 1. Normal left ventricular cavity size, severe asymmetric basal septal hypertrophy. LV septal wall measures upto 18mm in thickness.  Normal LV systolic function (LVEF = 62%).  There is systolic anterior motion (SAM) of mitral valve causing LV outflow tract obstruction.  There are no regional wall motion abnormalities.  There is Midwall scar/LGE in the lateral LV wall, mid to apical segments.  LGE/scar comprises 9 g, about 4% of total LV myocardial mass.  LVEDV: 198 ml  LVESV: 76 ml  SV: 122 ml  CO:4 L/min  Myocardial mass: 242g  2. Normal right ventricular size, thickness and systolic function (RVEF = 54%). There are no regional wall motion abnormalities.  3.  Normal left and right atrial size.  4. Normal size of the aortic root, ascending aorta and pulmonary artery.  5.  Mild mitral regurgitation.  6.  Normal pericardium.  trivial pericardial effusion.  IMPRESSION: 1. Normal LV function.  LVEF 62%  2. Severe asymmetric LV basal septal hypertrophy, measuring upto 18 mm in maximal thickness.  3. There is systolic anterior motion (SAM) of mitral valve causing LV outflow tract obstruction.  4. Midwall scar/LGE in the lateral LV wall, mid to apical segments.  5. LGE/scar comprises 9 g, about 4% of total LV myocardial mass.  6. Normal RV size and function.  7. 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   Electronically Signed By: Debbe Odea M.D. On: 09/14/2022 13:55  PYP SCAN  MYOCARDIAL AMYLOID PLANAR AND SPECT 06/09/2023  Narrative   Myocardial uptake was negative for radiotracer uptake. The visual grade of myocardial uptake relative to the ribs was Grade 0 (No myocardial uptake and normal bone  uptake).   Findings are not suggestive (Grade 0) of cardiac ATTR amyloidosis.   Prior study not available for comparison.   H/CL ratio < 1.5 measuring 1.37 with background correction  ______________________________________________________________________________________________       Physical Exam:    VS:  BP (!) 60/40 (BP Location: Left Arm)   Pulse (!) 101   Ht 6' (1.829 m)   Wt 268 lb 6.4 oz (121.7 kg)   SpO2 92%   BMI 36.40 kg/m    Wt Readings from Last 3 Encounters:  08/22/23 268 lb 6.4 oz (121.7 kg)  08/21/23 268 lb 9.6 oz (121.8 kg)  07/20/23 270 lb (122.5 kg)    Gen: Acute distress Cardiac: No Rubs or Gallops, no Murmur, regular tachycardia +2 radial pulses Respiratory: Clear to auscultation bilaterally, normal effort, normal  respiratory rate GI: Soft, nontender, non-distended  MS: No  edema;  moves all extremities Integument: Skin feels cool and clammy Neuro:  At time of evaluation, alert and oriented to person/place/time/situation   ASSESSMENT AND PLAN: .    Symptomatic Hypotension Experiencing symptomatic hypotension, likely related to cardiac amyloidosis, with severe dizziness and near syncope. Systolic blood pressure readings as low as 76/44 mmHg (repeat). Orthostatic hypotension may be a contributing factor. Difficulty participating in cardiac rehab due to low blood pressure. Risks include hypoperfusion and potential stroke, though unlikely unless persistent and severe. Midodrine chosen over fludrocortisone to avoid edema. Recommended ED and EMS call (patient refused) Direct admitted; called hospital he will get the first available bed - Administered oral fluids  - I placed an [place] 20 G R AC IV - Start midodrine 5 mg - Offer direct hospital admission for further management if symptoms persist. - WBC, Blood clutures, BMP - we had stopped all anti-hypertensive's - he has not taken his diuretic  Cardiac Amyloidosis Non-AL, non-ATTR cardiac amyloidosis may  contribute to symptomatic hypotension and affect multiple systems, including causing hoarseness and dysphagia. Acknowledged complexity and lack of clinical trials for this specific type. - Refer to Dr. Shirlee Latch, an advanced heart failure specialist, or Dr. Kai Levins at [facility] for further evaluation after hospitalization  Atrial Fibrillation Atrial fibrillation post-septal myectomy. Currently off amiodarone and metoprolol. Plan to monitor for recurrence post-washout of amiodarone. Consideration of ILR implantation if atrial fibrillation is suspected to monitor for stroke risk. - AT DC either ILR or preventice monitor for stroke eval  History of Stroke Stroke post-septal myectomy. Concern about recurrence, but current symptoms do not indicate a new stroke. On Plavix for stroke prevention. Consideration of ILR implantation if atrial fibrillation is suspected to monitor for stroke risk. - continue DAPT  Coronary Artery Disease Obstructive coronary artery disease addressed with  bypass surgery at time of septal myectomy. No current concern for obstructive coronary artery disease.  Hypertrophic Obstructive Cardiomyopathy Hypertrophic obstructive cardiomyopathy status post-septal myectomy. Myectomy complicated by postoperative atrial fibrillation and stroke. Cardiac thickness addressed by surgery. No residual Echo   Riley Lam, MD FASE [facility] Cardiologist [facility]  [address] [place], [address] [place], [place] [address] [phone]  10:17 AM  Given 500 CC fluid Given 1 L of PO fluid.  Feels better. Less Clammy.  Giving additional fluid.  Direct admitted heading to bed.   Riley Lam, MD FASE [facility] Cardiologist [facility]  [address] [place], [address] [place], [place] [address] [phone]  10:17 AM  Color has improved: still at 77/49  I have given him all of the IV fluid in our office.  Given 1 L + PO fluid. BP 74/54. Color has improved  I recommend ED  evaluation via EMS (refused) - Stable IV access.  Called to admission; patient should go to the hospital.  Riley Lam, MD FASE [facility] Cardiologist [facility]  [facility]  [address] [place], [address] [place], [place] [address] [phone]  11:20 AM  He is now amenable to ED evaluation. Patient going to ED  Riley Lam, MD FASE [facility] Cardiologist [facility]  [address] [place], [address] [place], [place] [address] [phone]  11:27 AM   Time Spent Directly with Patient:   I have spent a total of 93 minutes with the patient reviewing notes, imaging, EKGs, labs, and examining the patient as well as establishing an assessment and plan that was discussed personally with the patient. Placed an US guided IV.  Discussed EV evaluation.  Called ED, inpatient team.  Discussed with wife and daughter.  Direct admission.  Full code CBC ED Blood Cultures DVT PPX.  If refractory hypotension, PCCM Consult advised.  Riley Lam, MD FASE [facility] Cardiologist [facility]  [address] [place], [address] [place], [place] [address] [phone]  11:32 AM

## 2023-08-22 NOTE — ED Notes (Signed)
 Pt requesting his oxycodone for today. Went in patient room to administer the pain medication. Pt reports he already took his home dose earlier. Explained to patient that he is not allowed to take home prescription medication while in the hospital unless directed by a doctor.

## 2023-08-23 ENCOUNTER — Encounter (HOSPITAL_COMMUNITY): Payer: Medicare Other

## 2023-08-23 ENCOUNTER — Other Ambulatory Visit: Payer: Self-pay | Admitting: Cardiology

## 2023-08-23 DIAGNOSIS — I421 Obstructive hypertrophic cardiomyopathy: Secondary | ICD-10-CM | POA: Diagnosis not present

## 2023-08-23 DIAGNOSIS — E854 Organ-limited amyloidosis: Secondary | ICD-10-CM | POA: Diagnosis not present

## 2023-08-23 DIAGNOSIS — N049 Nephrotic syndrome with unspecified morphologic changes: Secondary | ICD-10-CM | POA: Diagnosis not present

## 2023-08-23 DIAGNOSIS — I43 Cardiomyopathy in diseases classified elsewhere: Secondary | ICD-10-CM | POA: Diagnosis not present

## 2023-08-23 DIAGNOSIS — I480 Paroxysmal atrial fibrillation: Secondary | ICD-10-CM

## 2023-08-23 DIAGNOSIS — I959 Hypotension, unspecified: Secondary | ICD-10-CM | POA: Diagnosis not present

## 2023-08-23 DIAGNOSIS — R802 Orthostatic proteinuria, unspecified: Secondary | ICD-10-CM | POA: Diagnosis not present

## 2023-08-23 LAB — BASIC METABOLIC PANEL
Anion gap: 6 (ref 5–15)
BUN: 11 mg/dL (ref 6–20)
CO2: 29 mmol/L (ref 22–32)
Calcium: 8.1 mg/dL — ABNORMAL LOW (ref 8.9–10.3)
Chloride: 105 mmol/L (ref 98–111)
Creatinine, Ser: 0.95 mg/dL (ref 0.61–1.24)
GFR, Estimated: >60 mL/min (ref >60)
Glucose, Bld: 103 mg/dL — ABNORMAL HIGH (ref 70–99)
Potassium: 4.2 mmol/L (ref 3.5–5.1)
Sodium: 140 mmol/L (ref 135–145)

## 2023-08-23 LAB — URINALYSIS, W/ REFLEX TO CULTURE (INFECTION SUSPECTED)
Bacteria, UA: NONE SEEN
Bilirubin Urine: NEGATIVE
Glucose, UA: NEGATIVE mg/dL
Hgb urine dipstick: NEGATIVE
Ketones, ur: NEGATIVE mg/dL
Nitrite: NEGATIVE
Protein, ur: >=300 mg/dL — AB
Specific Gravity, Urine: 1.013 (ref 1.005–1.030)
pH: 6 (ref 5.0–8.0)

## 2023-08-23 LAB — PROTEIN / CREATININE RATIO, URINE
Creatinine, Urine: 124 mg/dL
Protein Creatinine Ratio: 5.37 mg/mg{creat} — ABNORMAL HIGH (ref 0.00–0.15)
Total Protein, Urine: 666 mg/dL

## 2023-08-23 MED ORDER — ASPIRIN 81 MG PO TBEC: 81.0000 mg | DELAYED_RELEASE_TABLET | Freq: Every day | ORAL | 3 refills | Status: DC

## 2023-08-23 NOTE — Progress Notes (Signed)
 Rounding Note    Patient Name: Joseph [name]. Date of Encounter: 08/23/2023  [facility] Cardiologist: Tonny Bollman, MD   Subjective   BP 118/76 this morning.  Creatinine 0.95. Reports lightheadedness has resolved  Inpatient Medications    Scheduled Meds:  aspirin EC  81 mg Oral Daily   busPIRone  15 mg Oral BID   clopidogrel  75 mg Oral Daily   levothyroxine  200 mcg Oral Q0600   polyethylene glycol  17 g Oral Daily   Continuous Infusions:  PRN Meds: doxylamine (Sleep), oxyCODONE   Vital Signs    Vitals:   08/23/23 0130 08/23/23 0208 08/23/23 0500 08/23/23 0709  BP: 126/83  118/76   Pulse: (!) 59  69   Resp: 16  (!) 9   Temp:  (!) 97.4 F (36.3 C)  97.9 F (36.6 C)  TempSrc:  Oral  Oral  SpO2: 94%  96%   Weight:      Height:       No intake or output data in the 24 hours ending 08/23/23 0853    08/22/2023   12:14 PM 08/22/2023    8:40 AM 08/21/2023    2:23 PM  Last 3 Weights  Weight (lbs) 268 lb 268 lb 6.4 oz 268 lb 9.6 oz  Weight (kg) 121.564 kg 121.745 kg 121.836 kg      Telemetry    Normal sinus rhythm- Personally Reviewed  ECG    No new ECG- Personally Reviewed  Physical Exam   GEN: No acute distress.   Neck: No JVD Cardiac: RRR, no murmurs, rubs, or gallops.  Respiratory: Clear to auscultation bilaterally. GI: Soft, nontender, non-distended  MS: 1+ edema; No deformity. Neuro:  Nonfocal  Psych: Normal affect   Labs    High Sensitivity Troponin:   Recent Labs  Lab 08/22/23 1250 08/22/23 1540  TROPONINIHS 51* 51*     Chemistry Recent Labs  Lab 08/19/23 0837 08/22/23 1250 08/23/23 0222  NA 141 138 140  K 5.0 3.6 4.2  CL 104 104 105  CO2 25 27 29   GLUCOSE 139* 93 103*  BUN 13 12 11   CREATININE 0.93 1.04 0.95  CALCIUM 9.4 7.7* 8.1*  PROT  --  4.1*  --   ALBUMIN  --  1.5*  --   AST  --  77*  --   ALT  --  63*  --   ALKPHOS  --  54  --   BILITOT  --  0.4  --   GFRNONAA  --  >60 >60  ANIONGAP  --  7 6     Lipids No results for input(s): "CHOL", "TRIG", "HDL", "LABVLDL", "LDLCALC", "CHOLHDL" in the last 168 hours.  Hematology Recent Labs  Lab 08/22/23 1250  WBC 7.4  RBC 4.92  HGB 15.3  HCT 46.6  MCV 94.7  MCH 31.1  MCHC 32.8  RDW 13.4  PLT 188   Thyroid No results for input(s): "TSH", "FREET4" in the last 168 hours.  BNP Recent Labs  Lab 08/19/23 0837  PROBNP 44    DDimer No results for input(s): "DDIMER" in the last 168 hours.   Radiology    ECHOCARDIOGRAM LIMITED Result Date: 08/22/2023    ECHOCARDIOGRAM LIMITED REPORT   Patient Name:   Joseph [name]. Date of Exam: 08/22/2023 Medical Rec #:  [id number]         Height:       72.0 in Accession #:  [id number]        Weight:       268.0 lb Date of Birth:  1965-11-25          BSA:          2.413 m Patient Age:    58 years          BP:           110/69 mmHg Patient Gender: M                 HR:           71 bpm. Exam Location:  Inpatient Procedure: Limited Echo (Both Spectral and Color Flow Doppler were utilized            during procedure). Indications:    hypertrophic obstructive cardiomyopathy  History:        Patient has prior history of Echocardiogram examinations, most                 recent 08/15/2023. CAD, myectomy 2017; Risk Factors:Sleep Apnea,                 Hypertension and Dyslipidemia.  Sonographer:    Delcie Roch RDCS Referring Phys: [phone] [facility]ERT[facility]  Sonographer Comments: Suboptimal subcostal window. IMPRESSIONS  1. History of HOCM s/p septal reduction therapy. No evidence of outflow tract obstruction. Left ventricular ejection fraction, by estimation, is 60 to 65%. The left ventricle has normal function. The left ventricle has no regional wall motion abnormalities. There is mild concentric left ventricular hypertrophy. Left ventricular diastolic parameters are consistent with Grade I diastolic dysfunction (impaired relaxation).  2. Right ventricular systolic function is normal. The right ventricular size is  normal. Tricuspid regurgitation signal is inadequate for assessing PA pressure.  3. The mitral valve is grossly normal. No evidence of mitral valve regurgitation. No evidence of mitral stenosis. FINDINGS  Left Ventricle: History of HOCM s/p septal reduction therapy. No evidence of outflow tract obstruction. Left ventricular ejection fraction, by estimation, is 60 to 65%. The left ventricle has normal function. The left ventricle has no regional wall motion abnormalities. The left ventricular internal cavity size was normal in size. There is mild concentric left ventricular hypertrophy. Left ventricular diastolic parameters are consistent with Grade I diastolic dysfunction (impaired relaxation). Right Ventricle: The right ventricular size is normal. No increase in right ventricular wall thickness. Right ventricular systolic function is normal. Tricuspid regurgitation signal is inadequate for assessing PA pressure. Pericardium: There is no evidence of pericardial effusion. Mitral Valve: The mitral valve is grossly normal. No evidence of mitral valve stenosis. Tricuspid Valve: The tricuspid valve is grossly normal. Tricuspid valve regurgitation is not demonstrated. No evidence of tricuspid stenosis. Pulmonic Valve: The pulmonic valve was grossly normal. Pulmonic valve regurgitation is not visualized. No evidence of pulmonic stenosis. Aorta: The aortic root and ascending aorta are structurally normal, with no evidence of dilitation. Venous: The inferior vena cava was not well visualized. Additional Comments: Spectral Doppler performed. Color Doppler performed.  LEFT VENTRICLE PLAX 2D LVIDd:         5.60 cm   Diastology LVIDs:         4.10 cm   LV e' medial:    4.57 cm/s LV PW:         1.40 cm   LV E/e' medial:  14.2 LV IVS:        1.30 cm   LV e' lateral:   9.14 cm/s LVOT diam:  2.10 cm   LV E/e' lateral: 7.1 LV SV:         65 LV SV Index:   27 LVOT Area:     3.46 cm  LEFT ATRIUM         Index LA diam:    4.70 cm  1.95 cm/m  AORTIC VALVE LVOT Vmax:   120.00 cm/s LVOT Vmean:  72.100 cm/s LVOT VTI:    0.188 m  AORTA Ao Asc diam: 3.90 cm MITRAL VALVE MV Area (PHT): 4.06 cm    SHUNTS MV Decel Time: 187 msec    Systemic VTI:  0.19 m MV E velocity: 65.10 cm/s  Systemic Diam: 2.10 cm MV A velocity: 84.00 cm/s MV E/A ratio:  0.77 Lennie Odor MD Electronically signed by Lennie Odor MD Signature Date/Time: 08/22/2023/7:26:46 PM    Final    US Abdomen Limited RUQ (LIVER/GB) Result Date: 08/22/2023 CLINICAL DATA:  History of prior cholecystectomy presenting with hypoalbuminemia. EXAM: ULTRASOUND ABDOMEN LIMITED RIGHT UPPER QUADRANT COMPARISON:  None Available. FINDINGS: Gallbladder: The gallbladder is surgically absent. Common bile duct: Diameter: 5.4 mm Liver: No focal lesion identified. Diffusely increased echogenicity of the liver parenchyma is noted. Portal vein is patent on color Doppler imaging with normal direction of blood flow towards the liver. Other: The study is technically difficult secondary to the patient's body habitus and overlying bowel gas. IMPRESSION: 1. Findings consistent with history of prior cholecystectomy. 2. Hepatic steatosis. Electronically Signed   By: Aram Candela M.D.   On: 08/22/2023 19:17   DG Chest Port 1 View Result Date: 08/22/2023 CLINICAL DATA:  Near syncope, hypertension. EXAM: PORTABLE CHEST 1 VIEW COMPARISON:  June 15, 2023. FINDINGS: Stable cardiomediastinal silhouette. Sternotomy wires are noted. Minimal bibasilar subsegmental atelectasis is noted. Bony thorax is unremarkable. IMPRESSION: Minimal bibasilar subsegmental atelectasis. Electronically Signed   By: Lupita Raider M.D.   On: 08/22/2023 15:40    Cardiac Studies     Patient Profile     58 y.o. male with hokum status post myectomy, cardiac amyloidosis, CAD status post PCI 2011 was admitted with symptomatic hypotension  Assessment & Plan    Hypotension: Presented to cardiology appointment with Dr.  Izora Ribas on 3/4 with BP down to 76/44 patient with near syncopal episode.  Suspect overdiuresis, has been on p.o. Lasix.  Lasix was held and he was given IV fluids with resolution of hypotension. -Hold Lasix on discharge.  Suspect his lower extremity edema is due to hypoalbuminemia but he is intravascularly dry  ?Nephrotic syndrome: Albumin 1.5. He had a UPEP done 05/2023 which showed marked proteinuria (7.4 g protein in 24-hour collection).  Suspect likely has nephrotic syndrome due to his amyloidosis.  Will consult nephrology, discussed with Dr. Marisue Humble  HOCM: Status post septal myectomy.  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  Suspect also causing nephrotic syndrome as above  Atrial fibrillation: Noted following septal myectomy.  Currently off amiodarone and metoprolol.  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  Will order 30-day [employer] monitor  on discharge to evaluate for further A-fib  CVA: Occurred after septal myectomy.  Given also with A-fib after myectomy, will need to monitor for recurrence as not on anticoagulation.  Plan [employer] monitor as above.   For questions or updates, please contact [facility] Please consult [email or web address] for contact info under        Signed, Cristal Deer  Karlyne Greenspan, MD  08/23/2023, 8:53 AM

## 2023-08-23 NOTE — Discharge Summary (Addendum)
 Discharge Summary    Patient ID: Joseph [name]. MRN: [id number]; DOB: 19-Jul-1965  Admit date: 08/22/2023 Discharge date: 08/23/2023  PCP:  Richardean Chimera, MD   [facility] Providers Cardiologist:  Tonny Bollman, MD        Discharge Diagnoses    Principal Problem:   Hypotension Active Problems:   Nephrotic syndrome    Diagnostic Studies/Procedures    08/22/23 TTE Limited  IMPRESSIONS     1. History of HOCM s/p septal reduction therapy. No evidence of outflow  tract obstruction. Left ventricular ejection fraction, by estimation, is  60 to 65%. The left ventricle has normal function. The left ventricle has  no regional wall motion  abnormalities. There is mild concentric left ventricular hypertrophy. Left  ventricular diastolic parameters are consistent with Grade I diastolic  dysfunction (impaired relaxation).   2. Right ventricular systolic function is normal. The right ventricular  size is normal. Tricuspid regurgitation signal is inadequate for assessing  PA pressure.   3. The mitral valve is grossly normal. No evidence of mitral valve  regurgitation. No evidence of mitral stenosis.   FINDINGS   Left Ventricle: History of HOCM s/p septal reduction therapy. No evidence  of outflow tract obstruction. Left ventricular ejection fraction, by  estimation, is 60 to 65%. The left ventricle has normal function. The left  ventricle has no regional wall  motion abnormalities. The left ventricular internal cavity size was normal  in size. There is mild concentric left ventricular hypertrophy. Left  ventricular diastolic parameters are consistent with Grade I diastolic  dysfunction (impaired relaxation).   Right Ventricle: The right ventricular size is normal. No increase in  right ventricular wall thickness. Right ventricular systolic function is  normal. Tricuspid regurgitation signal is inadequate for assessing PA  pressure.   Pericardium: There is no  evidence of pericardial effusion.   Mitral Valve: The mitral valve is grossly normal. No evidence of mitral  valve stenosis.   Tricuspid Valve: The tricuspid valve is grossly normal. Tricuspid valve  regurgitation is not demonstrated. No evidence of tricuspid stenosis.   Pulmonic Valve: The pulmonic valve was grossly normal. Pulmonic valve  regurgitation is not visualized. No evidence of pulmonic stenosis.   Aorta: The aortic root and ascending aorta are structurally normal, with  no evidence of dilitation.   Venous: The inferior vena cava was not well visualized.   Additional Comments: Spectral Doppler performed. Color Doppler performed.  _____________   History of Present Illness     Joseph [name]. is a 58 y.o. male with hx of CAD s/p PCI and CABG (at time of myomectomy), HOCM, cardiac amyloidosis (non-AL, non-ATTR), atrial fibrillation (in setting of septal myomectomy), CVA, chronic lower back pain. Patient was seen in cardiology clinic on 3/4 with symptomatic hypotension, bp as low as 76/5mmHg. In clinic, BP was 60/40 and patient was advised to proceed to ED with EMS but declined. Was ultimately direct admitted for further management.   Hospital Course     Consultants: nephrology   Hypotension with frequent near syncope Patient direct admitted from clinic on 3/4 with symptomatic hypotension 60/40mmHg. Diuretics and anti-hypertensive agents and patient hydrated with IV fluids. He had rapid recovery of BP and is now stable for discharge, no longer dizzy.  Continue holding anti-hypertensive agents and Lasix. LE edema suspected secondary to hypoalbuminemia rather than CHF. Patient was likely intravascularly depleted secondary to diuretics, now improved with IV hydration.   Nephrotic syndrome Patient with albumin  of 1.5. UPEP done 05/2023 which showed marked proteinuria (7.4 g protein in 24-hour collection). Nephrology consulted, appreciate their assistance. Nephrotic syndrome  suspected, unclear etiology though likely related to his amyloidosis.  Nephrology will continue outpatient workup with close clinic follow up.  Hematology referral has also been arranged (per nephrology, appt set up for 08/25/23).    HOCM Patient s/p Myomectomy with [facility] clinic in 2024. Limited TTE this admission showed LVEF 60-65% with no outflow tract obstruction.  Patient will continue to follow with Dr. Izora Ribas.  Cardiac amyloidosis Myocardial biopsy with patient's myomectomy showed cardiac amyloidosis, non-AL, non-ATTR. As noted above, suspicious for causing his nephrotic syndrome.  Close follow up with advanced heart failure clinic arranged.  Atrial fibrillation Prior CVA Patient with atrial fibrillation and CVA follow his myomectomy. Has been on DAPT, not DOAC. Sinus rhythm this admission. Per Dr. Bjorn Pippin, patient will received 30 day event monitor to assess for recurrent afib/[facility].  Given upcoming need for renal biopsy, patient will continue on ASA only at discharge.  Plan to resume Plavix following biopsy  CAD Patient with CABG during myomectomy. No current symptoms to suggest ACS/obstructive CAD. Continue ASA 81mg  Continue statin and zetia Hold metoprolol with low normal BP at discharge.  Plan to resume Plavix following biopsy      Did the patient have an acute coronary syndrome (MI, NSTEMI, STEMI, etc) this admission?:  No                               Did the patient have a percutaneous coronary intervention (stent / angioplasty)?:  No.          _____________  Discharge Vitals Blood pressure 95/66, pulse 83, temperature 97.7 F (36.5 C), temperature source Oral, resp. rate 16, height 6' (1.829 m), weight 123.2 kg, SpO2 94%.  Filed Weights   08/22/23 1214 08/23/23 1210  Weight: 121.6 kg 123.2 kg    Labs & Radiologic Studies    CBC Recent Labs    08/22/23 1250  WBC 7.4  NEUTROABS 4.6  HGB 15.3  HCT 46.6  MCV 94.7  PLT 188   Basic  Metabolic Panel Recent Labs    29/56/21 1250 08/23/23 0222  NA 138 140  K 3.6 4.2  CL 104 105  CO2 27 29  GLUCOSE 93 103*  BUN 12 11  CREATININE 1.04 0.95  CALCIUM 7.7* 8.1*   Liver Function Tests Recent Labs    08/22/23 1250  AST 77*  ALT 63*  ALKPHOS 54  BILITOT 0.4  PROT 4.1*  ALBUMIN 1.5*   No results for input(s): "LIPASE", "AMYLASE" in the last 72 hours. High Sensitivity Troponin:   Recent Labs  Lab 08/22/23 1250 08/22/23 1540  TROPONINIHS 51* 51*    BNP Invalid input(s): "POCBNP" D-Dimer No results for input(s): "DDIMER" in the last 72 hours. Hemoglobin A1C No results for input(s): "HGBA1C" in the last 72 hours. Fasting Lipid Panel No results for input(s): "CHOL", "HDL", "LDLCALC", "TRIG", "CHOLHDL", "LDLDIRECT" in the last 72 hours. Thyroid Function Tests No results for input(s): "TSH", "T4TOTAL", "T3FREE", "THYROIDAB" in the last 72 hours.  Invalid input(s): "FREET3" _____________  ECHOCARDIOGRAM LIMITED Result Date: 08/22/2023    ECHOCARDIOGRAM LIMITED REPORT   Patient Name:   Joseph [name]. Date of Exam: 08/22/2023 Medical Rec #:  [id number]         Height:       72.0 in Accession #:  [id number]        Weight:       268.0 lb Date of Birth:  1965-11-14          BSA:          2.413 m Patient Age:    58 years          BP:           110/69 mmHg Patient Gender: M                 HR:           71 bpm. Exam Location:  Inpatient Procedure: Limited Echo (Both Spectral and Color Flow Doppler were utilized            during procedure). Indications:    hypertrophic obstructive cardiomyopathy  History:        Patient has prior history of Echocardiogram examinations, most                 recent 08/15/2023. CAD, myectomy 2017; Risk Factors:Sleep Apnea,                 Hypertension and Dyslipidemia.  Sonographer:    Delcie Roch RDCS Referring Phys: [phone] [facility]  Sonographer Comments: Suboptimal subcostal window. IMPRESSIONS  1. History of HOCM s/p septal  reduction therapy. No evidence of outflow tract obstruction. Left ventricular ejection fraction, by estimation, is 60 to 65%. The left ventricle has normal function. The left ventricle has no regional wall motion abnormalities. There is mild concentric left ventricular hypertrophy. Left ventricular diastolic parameters are consistent with Grade I diastolic dysfunction (impaired relaxation).  2. Right ventricular systolic function is normal. The right ventricular size is normal. Tricuspid regurgitation signal is inadequate for assessing PA pressure.  3. The mitral valve is grossly normal. No evidence of mitral valve regurgitation. No evidence of mitral stenosis. FINDINGS  Left Ventricle: History of HOCM s/p septal reduction therapy. No evidence of outflow tract obstruction. Left ventricular ejection fraction, by estimation, is 60 to 65%. The left ventricle has normal function. The left ventricle has no regional wall motion abnormalities. The left ventricular internal cavity size was normal in size. There is mild concentric left ventricular hypertrophy. Left ventricular diastolic parameters are consistent with Grade I diastolic dysfunction (impaired relaxation). Right Ventricle: The right ventricular size is normal. No increase in right ventricular wall thickness. Right ventricular systolic function is normal. Tricuspid regurgitation signal is inadequate for assessing PA pressure. Pericardium: There is no evidence of pericardial effusion. Mitral Valve: The mitral valve is grossly normal. No evidence of mitral valve stenosis. Tricuspid Valve: The tricuspid valve is grossly normal. Tricuspid valve regurgitation is not demonstrated. No evidence of tricuspid stenosis. Pulmonic Valve: The pulmonic valve was grossly normal. Pulmonic valve regurgitation is not visualized. No evidence of pulmonic stenosis. Aorta: The aortic root and ascending aorta are structurally normal, with no evidence of dilitation. Venous: The inferior  vena cava was not well visualized. Additional Comments: Spectral Doppler performed. Color Doppler performed.  LEFT VENTRICLE PLAX 2D LVIDd:         5.60 cm   Diastology LVIDs:         4.10 cm   LV e' medial:    4.57 cm/s LV PW:         1.40 cm   LV E/e' medial:  14.2 LV IVS:        1.30 cm   LV e' lateral:   9.14 cm/s LVOT diam:  2.10 cm   LV E/e' lateral: 7.1 LV SV:         65 LV SV Index:   27 LVOT Area:     3.46 cm  LEFT ATRIUM         Index LA diam:    4.70 cm 1.95 cm/m  AORTIC VALVE LVOT Vmax:   120.00 cm/s LVOT Vmean:  72.100 cm/s LVOT VTI:    0.188 m  AORTA Ao Asc diam: 3.90 cm MITRAL VALVE MV Area (PHT): 4.06 cm    SHUNTS MV Decel Time: 187 msec    Systemic VTI:  0.19 m MV E velocity: 65.10 cm/s  Systemic Diam: 2.10 cm MV A velocity: 84.00 cm/s MV E/A ratio:  0.77 Lennie Odor MD Electronically signed by Lennie Odor MD Signature Date/Time: 08/22/2023/7:26:46 PM    Final    US Abdomen Limited RUQ (LIVER/GB) Result Date: 08/22/2023 CLINICAL DATA:  History of prior cholecystectomy presenting with hypoalbuminemia. EXAM: ULTRASOUND ABDOMEN LIMITED RIGHT UPPER QUADRANT COMPARISON:  None Available. FINDINGS: Gallbladder: The gallbladder is surgically absent. Common bile duct: Diameter: 5.4 mm Liver: No focal lesion identified. Diffusely increased echogenicity of the liver parenchyma is noted. Portal vein is patent on color Doppler imaging with normal direction of blood flow towards the liver. Other: The study is technically difficult secondary to the patient's body habitus and overlying bowel gas. IMPRESSION: 1. Findings consistent with history of prior cholecystectomy. 2. Hepatic steatosis. Electronically Signed   By: Aram Candela M.D.   On: 08/22/2023 19:17   DG Chest Port 1 View Result Date: 08/22/2023 CLINICAL DATA:  Near syncope, hypertension. EXAM: PORTABLE CHEST 1 VIEW COMPARISON:  June 15, 2023. FINDINGS: Stable cardiomediastinal silhouette. Sternotomy wires are noted. Minimal bibasilar  subsegmental atelectasis is noted. Bony thorax is unremarkable. IMPRESSION: Minimal bibasilar subsegmental atelectasis. Electronically Signed   By: Lupita Raider M.D.   On: 08/22/2023 15:40   ECHOCARDIOGRAM COMPLETE Result Date: 08/15/2023    ECHOCARDIOGRAM REPORT   Patient Name:   Joseph [name]. Date of Exam: 08/15/2023 Medical Rec #:  [id number]         Height:       72.0 in Accession #:    [id number]        Weight:       270.0 lb Date of Birth:  1966/03/31          BSA:          2.420 m Patient Age:    58 years          BP:           82/62 mmHg Patient Gender: M                 HR:           73 bpm. Exam Location:  [address] Procedure: 2D Echo, Cardiac Doppler and Color Doppler (Both Spectral and Color            Flow Doppler were utilized during procedure). Indications:    I42.1 Hypertrophic obstructive cardiomyopathy  History:        Patient has prior history of Echocardiogram examinations, most                 recent 06/16/2023. Cardiac amyloidosis, CAD, Prior CABG, TIA,                 Arrythmias:Atrial Fibrillation; Risk Factors:Hypertension and  Sleep Apnea. Myectomy (November 2024).  Sonographer:    Clearence Ped RCS Referring Phys: [id number] TESSA N CONTE IMPRESSIONS  1. Left ventricular ejection fraction, by estimation, is 50 to 55%. The left ventricle has low normal function. The left ventricle has no regional wall motion abnormalities. There is moderate concentric left ventricular hypertrophy. Left ventricular diastolic parameters are consistent with Grade I diastolic dysfunction (impaired relaxation).  2. Right ventricular systolic function is normal. The right ventricular size is normal. Tricuspid regurgitation signal is inadequate for assessing PA pressure.  3. Left atrial size was mildly dilated.  4. The mitral valve is normal in structure. No evidence of mitral valve regurgitation.  5. The aortic valve is tricuspid. Aortic valve regurgitation is not visualized. Aortic valve  sclerosis/calcification is present, without any evidence of aortic stenosis.  6. The inferior vena cava is normal in size with greater than 50% respiratory variability, suggesting right atrial pressure of 3 mmHg. FINDINGS  Left Ventricle: Left ventricular ejection fraction, by estimation, is 50 to 55%. The left ventricle has low normal function. The left ventricle has no regional wall motion abnormalities. Strain imaging was not performed. The left ventricular internal cavity size was normal in size. There is moderate concentric left ventricular hypertrophy. Abnormal (paradoxical) septal motion, consistent with left bundle branch block. Left ventricular diastolic parameters are consistent with Grade I diastolic dysfunction (impaired relaxation). Normal left ventricular filling pressure. Right Ventricle: The right ventricular size is normal. No increase in right ventricular wall thickness. Right ventricular systolic function is normal. Tricuspid regurgitation signal is inadequate for assessing PA pressure. Left Atrium: Left atrial size was mildly dilated. Right Atrium: Right atrial size was normal in size. Pericardium: There is no evidence of pericardial effusion. Mitral Valve: The mitral valve is normal in structure. No evidence of mitral valve regurgitation. Tricuspid Valve: The tricuspid valve is normal in structure. Tricuspid valve regurgitation is not demonstrated. Aortic Valve: The aortic valve is tricuspid. Aortic valve regurgitation is not visualized. Aortic valve sclerosis/calcification is present, without any evidence of aortic stenosis. Pulmonic Valve: The pulmonic valve was grossly normal. Pulmonic valve regurgitation is not visualized. No evidence of pulmonic stenosis. Aorta: The aortic root and ascending aorta are structurally normal, with no evidence of dilitation. Venous: The inferior vena cava is normal in size with greater than 50% respiratory variability, suggesting right atrial pressure of 3 mmHg.  IAS/Shunts: No atrial level shunt detected by color flow Doppler. Additional Comments: 3D imaging was not performed.  LEFT VENTRICLE PLAX 2D LVIDd:         4.20 cm   Diastology LVIDs:         3.00 cm   LV e' medial:    8.92 cm/s LV PW:         1.50 cm   LV E/e' medial:  6.3 LV IVS:        1.30 cm   LV e' lateral:   5.00 cm/s LVOT diam:     2.20 cm   LV E/e' lateral: 11.3 LV SV:         94 LV SV Index:   39 LVOT Area:     3.80 cm  RIGHT VENTRICLE RV Basal diam:  3.10 cm RV S prime:     7.18 cm/s TAPSE (M-mode): 1.4 cm LEFT ATRIUM             Index        RIGHT ATRIUM           Index  LA diam:        4.50 cm 1.86 cm/m   RA Area:     12.40 cm LA Vol (A2C):   43.2 ml 17.85 ml/m  RA Volume:   26.40 ml  10.91 ml/m LA Vol (A4C):   55.3 ml 22.85 ml/m LA Biplane Vol: 49.8 ml 20.58 ml/m  AORTIC VALVE LVOT Vmax:   137.00 cm/s LVOT Vmean:  86.500 cm/s LVOT VTI:    0.246 m  AORTA Ao Root diam: 3.80 cm Ao Asc diam:  3.60 cm MITRAL VALVE MV Area (PHT): 5.23 cm    SHUNTS MV Decel Time: 145 msec    Systemic VTI:  0.25 m MV E velocity: 56.60 cm/s  Systemic Diam: 2.20 cm MV A velocity: 72.30 cm/s MV E/A ratio:  0.78 Mihai Croitoru MD Electronically signed by Thurmon Fair MD Signature Date/Time: 08/15/2023/1:25:44 PM    Final    Disposition   Pt is being discharged home today in good condition.  Follow-up Plans & Appointments     Follow-up Information     Arita Miss, MD Follow up.   Specialty: Nephrology Why: We will call with details about labs and an follow up appointment Contact information: [address] [place] [address] [phone]                   Discharge Medications   Allergies as of 08/23/2023       Reactions   Alpha-gal Anaphylaxis   Any mammalian meat or products   Penicillins Anaphylaxis   **CEFAZOLIN received on 02/07/2014 and 04/08/2015 with no documented ADRs**   Zestril [lisinopril] Anaphylaxis   Gadavist [gadobutrol] Nausea And Vomiting   Pt vomits with  Gadavist contrast 09/14/2022 during cardiac MRI scan    Beef-derived Drug Products    No beef  alpha -gal allergy   Pork-derived Products    No pork  alpha-gal allergy   Ms Contin [morphine] Anxiety, Other (See Comments)   Panic attacks   Tetanus Toxoid Other (See Comments)   Unknown reaction        Medication List     STOP taking these medications    clopidogrel 75 MG tablet Commonly known as: PLAVIX       TAKE these medications    acetaminophen 650 MG CR tablet Commonly known as: TYLENOL Take 650 mg by mouth at bedtime.   alfuzosin 10 MG 24 hr tablet Commonly known as: UROXATRAL Take 10 mg by mouth daily.   aspirin EC 81 MG tablet Take 1 tablet (81 mg total) by mouth daily. Swallow whole. Start taking on: August 24, 2023 What changed: additional instructions   busPIRone 15 MG tablet Commonly known as: BUSPAR Take 15 mg by mouth 2 (two) times daily.   diphenhydramine-acetaminophen 25-500 MG Tabs tablet Commonly known as: TYLENOL PM Take 1 tablet by mouth at bedtime.   doxylamine (Sleep) 25 MG tablet Commonly known as: UNISOM Take 25 mg by mouth at bedtime.   EPINEPHrine 0.3 mg/0.3 mL Soaj injection Commonly known as: EpiPen 2-Pak Inject 0.3 mLs (0.3 mg total) into the muscle as needed for anaphylaxis.   ezetimibe 10 MG tablet Commonly known as: ZETIA Take 1 tablet (10 mg total) by mouth daily.   levothyroxine 200 MCG tablet Commonly known as: SYNTHROID Take 1 tablet (200 mcg total) by mouth daily before breakfast.   Multivitamin Men 50+ Tabs Take 1 tablet by mouth daily.   nitroGLYCERIN 0.4 MG SL tablet Commonly known as: NITROSTAT Place 0.4  mg under the tongue every 5 (five) minutes as needed for chest pain.   Oxycodone HCl 20 MG Tabs Take 1 tablet (20 mg total) by mouth as directed. What changed:  when to take this reasons to take this   rosuvastatin 40 MG tablet Commonly known as: CRESTOR Take 1 tablet (40 mg total) by mouth daily.    VITAMIN B-6 PO Take 1 tablet by mouth daily.   VITAMIN C PO Take 1 tablet by mouth daily.   VITAMIN D-3 PO Take 1 capsule by mouth at bedtime.           Outstanding Labs/Studies    Duration of Discharge Encounter: APP Time: 25 minutes   Signed, Perlie Gold, PA-C 08/23/2023, 2:41 PM   Patient seen and examined.  Agree with above documentation.  I spent 35 minutes in patient care including including seeing and examining patient, reviewing lab work and telemetry, discussing case with nephrologist and his primary cardiologist, and discussing discharge plan with patient and his wife  Little Ishikawa, MD

## 2023-08-23 NOTE — ED Notes (Signed)
 Patient sleeping at this time, patient wife stated she will let him know when he wakes up that we need a UA.

## 2023-08-23 NOTE — H&P (Signed)
 Cardiology Office Note:  .     Date:  08/22/2023  ID:  Joseph [name]., DOB 1965/08/03, MRN [id number] PCP: Joseph Chimera, MD  [facility] Providers Cardiologist:  Joseph Bollman, MD      PROVIDER NOTE: Patient was admitted directly under different encounter.  This dupliation of documenation from 08/22/23 is for clarification  CC: Follow up HCM and Amyloidosis care   History of Present Illness: .     Joseph [name]. is a 58 y.o. male with a hx of  CAD with prior PCI since 2011. Chronic lower back pain.  Complicated by oHCM. Medically managed in 2023-> SRT in [id number]. Discussed the use of AI scribe software for clinical note transcription with the patient, who gave verbal consent to proceed.   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 He is accompanied by his daughter, Joseph [name].   He has been experiencing symptomatic hypotension, particularly during cardiac rehabilitation, which limits his ability to participate in therapy. Blood pressure readings have been as low as 76/44 mmHg, causing dizziness and weakness, almost to the point of syncope. He feels cold and has a decreased appetite, with intake varying day to day. No fever or chills.   He has a diagnosis of non-AL, non-ATTR cardiac amyloidosis, which may contribute to his low blood pressure. He experiences orthostatic hypotension, with significant blood pressure variability. He also has hoarseness and difficulty swallowing, with intermittent episodes of choking while eating and hoarseness lasting several days.   He has a history of hypertrophic obstructive cardiomyopathy, for which he underwent a septal myectomy at [facility]. This procedure was complicated by postoperative atrial fibrillation and a stroke. He also has obstructive coronary artery disease, for which he had a bypass at the time of his septal myectomy.   His medication  regimen has been adjusted due to low blood pressure. He is no longer on metoprolol, and amiodarone has been discontinued. He is currently on a very low dose of Lasix. Recent labs were normal.   His daughter, Joseph [name], is concerned about the possibility of another stroke, as he has been quieter since his previous stroke and has been experiencing similar symptoms.     Relevant histories: .  Social  2024: tried CMI therapy for one week: he felt that it was affecting his oxycodone. He has called in lately with low blood pressure.  Summer he called in with near syncopal events: no NSVT on heart monitor.  Had Myectomy 04/2023. 2025: Residual issues from his [facility] Stroke   ROS: As per HPI.     Studies Reviewed: .   Cardiac Studies & Procedures   ______________________________________________________________________________________________ CARDIAC CATHETERIZATION   CARDIAC CATHETERIZATION 08/15/2022   Narrative 1.  Moderate nonobstructive proximal RCA stenosis of 50 to 60%, RFR evaluation performed and is equal to 0.95 2.  Patent left main, LAD, and left circumflex with mild in-stent restenosis in the mid LAD stent, no obstruction in the left main or circumflex 3.  Essentially normal right heart pressures, normal LVEDP of 14 mmHg, mean PA pressure 22 mmHg, RA pressure mean of 5.  Cardiac output 6.5, cardiac index 2.7. 4.  No significant pullback gradient from the mid left ventricle to the aorta with a peak gradient of 5 mmHg   Recommend: Medical therapy for nonobstructive CAD, continue medical therapy for potential hypertrophic cardiomyopathy.  Refer to HCM specialist for discussion of further management options.  Of note, patient had a large gradient by echo assessment not demonstrated by catheter pullback.   Findings Coronary Findings Diagnostic  Dominance: Right   Left Main The vessel exhibits minimal luminal irregularities. Mid LM lesion is 25% stenosed. The lesion is eccentric.  The lesion is mildly calcified.   Left Anterior Descending There is mild diffuse disease throughout the vessel. Prox LAD to Mid LAD lesion is 40% stenosed. The lesion was previously treated over 2 years ago.   Left Circumflex The vessel exhibits minimal luminal irregularities.   Right Coronary Artery Vessel is large. There is mild diffuse disease throughout the vessel. Prox RCA lesion is 60% stenosed. The lesion is eccentric. The lesion is moderately calcified.   Intervention   No interventions have been documented.       ECHOCARDIOGRAM   ECHOCARDIOGRAM COMPLETE 08/15/2023   Narrative ECHOCARDIOGRAM REPORT       Patient Name:   Joseph [name]. Date of Exam: 08/15/2023 Medical Rec #:  [id number]         Height:       72.0 in Accession #:    [id number]        Weight:       270.0 lb Date of Birth:  [birth year]          BSA:          2.420 m Patient Age:    58 years          BP:           82/62 mmHg Patient Gender: M                 HR:           73 bpm. Exam Location:  [address]   Procedure: 2D Echo, Cardiac Doppler and Color Doppler (Both Spectral and Color Flow Doppler were utilized during procedure).   Indications:    I42.1 Hypertrophic obstructive cardiomyopathy   History:        Patient has prior history of Echocardiogram examinations, most recent 06/16/2023. Cardiac amyloidosis, CAD, Prior CABG, TIA, Arrythmias:Atrial Fibrillation; Risk Factors:Hypertension and Sleep Apnea. Myectomy (November 2024).   Sonographer:    Joseph [name] RCS Referring Phys: 22 Joseph [name]   IMPRESSIONS     1. Left ventricular ejection fraction, by estimation, is 50 to 55%. The left ventricle has low normal function. The left ventricle has no regional wall motion abnormalities. There is moderate concentric left ventricular hypertrophy. Left ventricular diastolic parameters are consistent with Grade I diastolic dysfunction (impaired relaxation). 2. Right ventricular systolic  function is normal. The right ventricular size is normal. Tricuspid regurgitation signal is inadequate for assessing PA pressure. 3. Left atrial size was mildly dilated. 4. The mitral valve is normal in structure. No evidence of mitral valve regurgitation. 5. The aortic valve is tricuspid. Aortic valve regurgitation is not visualized. Aortic valve sclerosis/calcification is present, without any evidence of aortic stenosis. 6. The inferior vena cava is normal in size with greater than 50% respiratory variability, suggesting right atrial pressure of 3 mmHg.   FINDINGS Left Ventricle: Left ventricular ejection fraction, by estimation, is 50 to 55%. The left ventricle has low normal function. The left ventricle has no regional wall motion abnormalities. Strain imaging was not performed. The left ventricular internal cavity size was normal in size. There is moderate concentric left ventricular hypertrophy. Abnormal (paradoxical) septal motion, consistent with left bundle branch block. Left ventricular diastolic parameters are consistent with Grade I diastolic  dysfunction (impaired relaxation). Normal left ventricular filling pressure.   Right Ventricle: The right ventricular size is normal. No increase in right ventricular wall thickness. Right ventricular systolic function is normal. Tricuspid regurgitation signal is inadequate for assessing PA pressure.   Left Atrium: Left atrial size was mildly dilated.   Right Atrium: Right atrial size was normal in size.   Pericardium: There is no evidence of pericardial effusion.   Mitral Valve: The mitral valve is normal in structure. No evidence of mitral valve regurgitation.   Tricuspid Valve: The tricuspid valve is normal in structure. Tricuspid valve regurgitation is not demonstrated.   Aortic Valve: The aortic valve is tricuspid. Aortic valve regurgitation is not visualized. Aortic valve sclerosis/calcification is present, without any evidence of aortic  stenosis.   Pulmonic Valve: The pulmonic valve was grossly normal. Pulmonic valve regurgitation is not visualized. No evidence of pulmonic stenosis.   Aorta: The aortic root and ascending aorta are structurally normal, with no evidence of dilitation.   Venous: The inferior vena cava is normal in size with greater than 50% respiratory variability, suggesting right atrial pressure of 3 mmHg.   IAS/Shunts: No atrial level shunt detected by color flow Doppler.   Additional Comments: 3D imaging was not performed.     LEFT VENTRICLE PLAX 2D LVIDd:         4.20 cm   Diastology LVIDs:         3.00 cm   LV e' medial:    8.92 cm/s LV PW:         1.50 cm   LV E/e' medial:  6.3 LV IVS:        1.30 cm   LV e' lateral:   5.00 cm/s LVOT diam:     2.20 cm   LV E/e' lateral: 11.3 LV SV:         94 LV SV Index:   39 LVOT Area:     3.80 cm     RIGHT VENTRICLE RV Basal diam:  3.10 cm RV S prime:     7.18 cm/s TAPSE (M-mode): 1.4 cm   LEFT ATRIUM             Index        RIGHT ATRIUM           Index LA diam:        4.50 cm 1.86 cm/m   RA Area:     12.40 cm LA Vol (A2C):   43.2 ml 17.85 ml/m  RA Volume:   26.40 ml  10.91 ml/m LA Vol (A4C):   55.3 ml 22.85 ml/m LA Biplane Vol: 49.8 ml 20.58 ml/m AORTIC VALVE LVOT Vmax:   137.00 cm/s LVOT Vmean:  86.500 cm/s LVOT VTI:    0.246 m   AORTA Ao Root diam: 3.80 cm Ao Asc diam:  3.60 cm   MITRAL VALVE MV Area (PHT): 5.23 cm    SHUNTS MV Decel Time: 145 msec    Systemic VTI:  0.25 m MV E velocity: 56.60 cm/s  Systemic Diam: 2.20 cm MV A velocity: 72.30 cm/s MV E/A ratio:  0.78   Mihai Croitoru MD Electronically signed by Thurmon Fair MD Signature Date/Time: 08/15/2023/1:25:44 PM       Final   TEE   ECHO TEE 04/14/2023   Narrative TRANSESOPHOGEAL ECHO REPORT       Patient Name:   Joseph [name]. Date of Exam: 04/14/2023 Medical Rec #:  [id number]  Height:       72.0 in Accession #:    [id number]        Weight:        270.0 lb Date of Birth:  Mar 11, 1966          BSA:          2.420 m Patient Age:    57 years          BP:           117/68 mmHg Patient Gender: M                 HR:           52 bpm. Exam Location:  Inpatient   Procedure: Transesophageal Echo, Color Doppler, Cardiac Doppler and 3D Echo   MODIFIED REPORT: This report was modified by Riley Lam MD on 04/17/2023 due to Spelling. Indications:     Hypertrophic Cardiomyopathy   History:         Patient has prior history of Echocardiogram examinations, most recent 06/28/2022. CAD, Thyroid Disease; Risk Factors:Hypertension, Sleep Apnea and Dyslipidemia.   Sonographer:     Milbert Coulter Referring Phys:  [id number] [facility] Joseph [name] Diagnosing Phys: Riley Lam MD     Sonographer Comments: TEE probe [phone] utilized for procedure.     PROCEDURE: After discussion of the risks and benefits of a TEE, an informed consent was obtained from the patient. The transesophogeal probe was passed without difficulty through the esophogus of the patient. Imaged were obtained with the patient in a left lateral decubitus position. Sedation performed by different physician. The patient was monitored while under deep sedation. Anesthestetic sedation was provided intravenously by Anesthesiology: 449.35mg  of Propofol, 100mg  of Lidocaine. Image quality was good. The patient's vital signs; including heart rate, blood pressure, and oxygen saturation; remained stable throughout the procedure. Supplementary images were obtained from transthoracic windows as indicated to answer the clinical question. The patient developed no complications during the procedure.   IMPRESSIONS     1. Fasting Valsalva LVOT gradient 29 mmHg. Maximal septal thickness 24 mm, basal septum. Left ventricular ejection fraction, by estimation, is 60 to 65%. The left ventricle has normal function. There is severe asymmetric left ventricular hypertrophy of the septal segment. 2.  Right ventricular systolic function is normal. The right ventricular size is normal. 3. Left atrial size was mildly dilated. No left atrial/left atrial appendage thrombus was detected. 4. Mild regurgitation not related to systolic anterior motion of the mitral valve, but related to elongated papillary muscle; 3D asessement confirms central, A2-P2 jet. The mitral valve is abnormal. Mild mitral valve regurgitation. No evidence of mitral stenosis. 5. The aortic valve is tricuspid. Aortic valve regurgitation is not visualized. Aortic valve sclerosis is present, with no evidence of aortic valve stenosis. 6. Cannot exclude a small PFO.   FINDINGS Left Ventricle: Fasting Valsalva LVOT gradient 29 mmHg. Maximal septal thickness 24 mm, basal septum. Left ventricular ejection fraction, by estimation, is 60 to 65%. The left ventricle has normal function. The left ventricular internal cavity size was normal in size. There is severe asymmetric left ventricular hypertrophy of the septal segment.   Right Ventricle: The right ventricular size is normal. No increase in right ventricular wall thickness. Right ventricular systolic function is normal.   Left Atrium: Left atrial size was mildly dilated. No left atrial/left atrial appendage thrombus was detected.   Right Atrium: Right atrial size was normal in size.   Pericardium: There is no evidence of  pericardial effusion.   Mitral Valve: Mild regurgitation not related to systolic anterior motion of the mitral valve, but related to elongated papillary muscle; 3D asessement confirms central, A2-P2 jet. The mitral valve is abnormal. Mild mitral valve regurgitation. No evidence of mitral valve stenosis.   Tricuspid Valve: The tricuspid valve is normal in structure. Tricuspid valve regurgitation is not demonstrated. No evidence of tricuspid stenosis.   Aortic Valve: The aortic valve is tricuspid. Aortic valve regurgitation is not visualized. Aortic valve sclerosis  is present, with no evidence of aortic valve stenosis. Aortic valve mean gradient measures 6.0 mmHg. Aortic valve peak gradient measures 12.4 mmHg.   Pulmonic Valve: The pulmonic valve was normal in structure. Pulmonic valve regurgitation is trivial. No evidence of pulmonic stenosis.   Aorta: The aortic root, ascending aorta, aortic arch and descending aorta are all structurally normal, with no evidence of dilitation or obstruction.   Pulmonary Artery: The pulmonary artery is of normal size.   IAS/Shunts: Cannot exclude a small PFO.   Additional Comments: Spectral Doppler performed.   LEFT VENTRICLE PLAX 2D LVIDd:         4.00 cm LV PW:         1.90 cm LV IVS:        2.40 cm     AORTIC VALVE AV Vmax:           176.00 cm/s AV Vmean:          113.000 cm/s AV VTI:            0.340 m AV Peak Grad:      12.4 mmHg AV Mean Grad:      6.0 mmHg LVOT Vmax:         167.00 cm/s LVOT Vmean:        102.000 cm/s LVOT VTI:          0.337 m LVOT/AV VTI ratio: 0.99     SHUNTS Systemic VTI: 0.34 m   Riley Lam MD Electronically signed by Riley Lam MD Signature Date/Time: 04/14/2023/5:08:27 PM       Final (Updated)   MONITORS   CARDIAC EVENT MONITOR 11/24/2022   Narrative   Patient had a minimum heart rate of 40 bpm (nocturnal), maximum heart rate of 120 bpm, and average heart rate of 62 bpm.   Predominant underlying rhythm was sinus rhythm.   No NSVT. No atrial fibrillation or flutter.   Isolated PACs were rare (<1.0%).   Isolated PVCs were rare (<1.0%).   First degree heart block noted.   Triggered and diary events associated with sinus rhythm.   No malignant arrhythmias     CARDIAC MRI   MR CARDIAC MORPHOLOGY W WO CONTRAST 09/14/2022   Narrative CLINICAL DATA:  HCM   EXAM: CARDIAC MRI   TECHNIQUE: The patient was scanned on a 1.5 Tesla Siemens magnet. A dedicated cardiac coil was used. Functional imaging was done using Fiesta sequences. 2,3,  and 4 chamber views were done to assess for RWMA's. Modified Simpson's rule using a short axis stack was used to calculate an ejection fraction on a dedicated work [employer]. The patient received 15 cc of Gadavist. After 10 minutes inversion recovery sequences were used to assess for infiltration and scar tissue. Velocity flow mapping performed in the ascending aorta and main pulmonary artery.   CONTRAST:  15 cc  of Gadavist   FINDINGS: 1. Normal left ventricular cavity size, severe asymmetric basal septal hypertrophy. LV septal wall measures upto  18mm in thickness.   Normal LV systolic function (LVEF = 62%).   There is systolic anterior motion (SAM) of mitral valve causing LV outflow tract obstruction.   There are no regional wall motion abnormalities.   There is Midwall scar/LGE in the lateral LV wall, mid to apical segments.   LGE/scar comprises 9 g, about 4% of total LV myocardial mass.   LVEDV: 198 ml   LVESV: 76 ml   SV: 122 ml   CO:4 L/min   Myocardial mass: 242g   2. Normal right ventricular size, thickness and systolic function (RVEF = 54%). There are no regional wall motion abnormalities.   3.  Normal left and right atrial size.   4. Normal size of the aortic root, ascending aorta and pulmonary artery.   5.  Mild mitral regurgitation.   6.  Normal pericardium.  trivial pericardial effusion.   IMPRESSION: 1. Normal LV function.  LVEF 62%   2. Severe asymmetric LV basal septal hypertrophy, measuring upto 18 mm in maximal thickness.   3. There is systolic anterior motion (SAM) of mitral valve causing LV outflow tract obstruction.   4. Midwall scar/LGE in the lateral LV wall, mid to apical segments.   5. LGE/scar comprises 9 g, about 4% of total LV myocardial mass.   6. Normal RV size and function.   7. 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     Electronically Signed By: Debbe Odea  M.D. On: 09/14/2022 13:55   PYP SCAN   MYOCARDIAL AMYLOID PLANAR AND SPECT 06/09/2023   Narrative   Myocardial uptake was negative for radiotracer uptake. The visual grade of myocardial uptake relative to the ribs was Grade 0 (No myocardial uptake and normal bone uptake).   Findings are not suggestive (Grade 0) of cardiac ATTR amyloidosis.   Prior study not available for comparison.   H/CL ratio < 1.5 measuring 1.37 with background correction   ______________________________________________________________________________________________         Physical Exam:     VS:  BP (!) 60/40 (BP Location: Left Arm)   Pulse (!) 101   Ht 6' (1.829 m)   Wt 268 lb 6.4 oz (121.7 kg)   SpO2 92%   BMI 36.40 kg/m       Wt Readings from Last 3 Encounters:  08/22/23 268 lb 6.4 oz (121.7 kg)  08/21/23 268 lb 9.6 oz (121.8 kg)  07/20/23 270 lb (122.5 kg)    Gen: Acute distress Cardiac: No Rubs or Gallops, no Murmur, regular tachycardia +2 radial pulses Respiratory: Clear to auscultation bilaterally, normal effort, normal  respiratory rate GI: Soft, nontender, non-distended  MS: No  edema;  moves all extremities Integument: Skin feels cool and clammy Neuro:  At time of evaluation, alert and oriented to person/place/time/situation    ASSESSMENT AND PLAN: .     Symptomatic Hypotension Experiencing symptomatic hypotension, likely related to cardiac amyloidosis, with severe dizziness and near syncope. Systolic blood pressure readings as low as 76/44 mmHg (repeat). Orthostatic hypotension may be a contributing factor. Difficulty participating in cardiac rehab due to low blood pressure. Risks include hypoperfusion and potential stroke, though unlikely unless persistent and severe. Midodrine chosen over fludrocortisone to avoid edema. Recommended ED and EMS call (patient refused) Direct admitted; called hospital he will get the first available bed - Administered oral fluids  - I placed an [place] 20 G R  AC IV - Start midodrine 5 mg - Offer direct hospital admission for further management if symptoms persist. -  WBC, Blood clutures, BMP - we had stopped all anti-hypertensive's - he has not taken his diuretic   Cardiac Amyloidosis Non-AL, non-ATTR cardiac amyloidosis may contribute to symptomatic hypotension and affect multiple systems, including causing hoarseness and dysphagia. Acknowledged complexity and lack of clinical trials for this specific type. - Refer to Dr. Shirlee Latch, an advanced heart failure specialist, or Dr. Kai Levins at [facility] for further evaluation after hospitalization   Atrial Fibrillation Atrial fibrillation post-septal myectomy. Currently off amiodarone and metoprolol. Plan to monitor for recurrence post-washout of amiodarone. Consideration of ILR implantation if atrial fibrillation is suspected to monitor for stroke risk. - AT DC either ILR or preventice monitor for stroke eval   History of Stroke Stroke post-septal myectomy. Concern about recurrence, but current symptoms do not indicate a new stroke. On Plavix for stroke prevention. Consideration of ILR implantation if atrial fibrillation is suspected to monitor for stroke risk. - continue DAPT   Coronary Artery Disease Obstructive coronary artery disease addressed with bypass surgery at time of septal myectomy. No current concern for obstructive coronary artery disease.   Hypertrophic Obstructive Cardiomyopathy Hypertrophic obstructive cardiomyopathy status post-septal myectomy. Myectomy complicated by postoperative atrial fibrillation and stroke. Cardiac thickness addressed by surgery. No residual Echo     Riley Lam, MD FASE [facility] Cardiologist [facility]  [address] [place], [address] [place], [place] [address] [phone]  10:17 AM   Given 500 CC fluid Given 1 L of PO fluid.  Feels better. Less Clammy.  Giving additional fluid.  Direct admitted heading to bed.    Riley Lam, MD FASE  [facility] Cardiologist [facility]  [address] [place], [address] [place], [place] [address] [phone]  10:17 AM   Color has improved: still at 77/49   I have given him all of the IV fluid in our office.  Given 1 L + PO fluid. BP 74/54. Color has improved   I recommend ED evaluation via EMS (refused) - Stable IV access.  Called to admission; patient should go to the hospital.   Riley Lam, MD FASE [facility] Cardiologist [facility]  [facility]  [address] [place], [address] [place], [place] [address] [phone]  11:20 AM   He is now amenable to ED evaluation. Patient going to ED   Riley Lam, MD FASE [facility] Cardiologist [facility]  [address] [place], [address] [place], [place] [address] [phone]  11:27 AM     Time Spent Directly with Patient:   I have spent a total of 93 minutes with the patient reviewing notes, imaging, EKGs, labs, and examining the patient as well as establishing an assessment and plan that was discussed personally with the patient. Placed an US guided IV.  Discussed EV evaluation.  Called ED, inpatient team.  Discussed with wife and daughter.  Direct admission.   Full code CBC ED Blood Cultures DVT PPX.   If refractory hypotension, PCCM Consult advised.   Riley Lam, MD FASE [facility] Cardiologist [facility]  [address] [place], [address] [place], [place] [address] [phone]  11:32 AM
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
## 2023-08-23 NOTE — Care Management Obs Status (Signed)
 MEDICARE OBSERVATION STATUS NOTIFICATION   Patient Details  Name: Joseph [name]. MRN: [id number] Date of Birth: 1966-01-26   Medicare Observation Status Notification Given:  Yes    Gala Lewandowsky, RN 08/23/2023, 4:02 PM

## 2023-08-23 NOTE — TOC CM/SW Note (Signed)
 Transition of Care [facility]) - Inpatient Brief Assessment   Patient Details  Name: Joseph [name]. MRN: [id number] Date of Birth: Feb 19, 1966  Transition of Care [facility] CM/SW Contact:    Gala Lewandowsky, RN Phone Number: 08/23/2023, 4:07 PM   Clinical Narrative: Patient presented for hypotension. PTA patient is from home with support of spouse. No home needs identified at this time.   Transition of Care Asessment: Insurance and Status: Insurance coverage has been reviewed Patient has primary care physician: Yes Home environment has been reviewed: reviewed Prior level of function:: independent Prior/Current Home Services: No current home services Social Drivers of Health Review: SDOH reviewed no interventions necessary Readmission risk has been reviewed: Yes Transition of care needs: no transition of care needs at this time

## 2023-08-23 NOTE — Care Management CC44 (Signed)
 Condition Code 44 Documentation Completed  Patient Details  Name: Joseph [name]. MRN: [id number] Date of Birth: [birth year]   Condition Code 44 given:  Yes Patient signature on Condition Code 44 notice:  Yes Documentation of 2 MD's agreement:  Yes Code 44 added to claim:  Yes    Gala Lewandowsky, RN 08/23/2023, 4:02 PM

## 2023-08-24 LAB — MICROALBUMIN / CREATININE URINE RATIO
Creatinine, Urine: 115.4 mg/dL
Microalb Creat Ratio: 4482 mg/g{creat} — ABNORMAL HIGH (ref 0–29)
Microalb, Ur: 5172.6 ug/mL — ABNORMAL HIGH

## 2023-08-24 NOTE — Telephone Encounter (Addendum)
 Spoke to patients wife (on [place]) and she was advised the information from [employer], [address] and [place]. Wife reports that she is okay to move forward with the monitor they are receiving in the mail.   Dr. Izora Ribas,  Pt's wife did report that the patient had a episode today that lasted about 15 minutes. She reports that pt was asymptomatic. Pt was moving around and blood pressure decreased to 90/54. Wife rechecked blood pressure again after giving he patient something salty to eat and water to drink. BP resulted at 112/68. Wife is wondering if a medication is needed to be started to help elevate pt's BP. She reports that this was discussed in the hospital.

## 2023-08-25 ENCOUNTER — Inpatient Hospital Stay: Attending: Internal Medicine

## 2023-08-25 ENCOUNTER — Encounter (HOSPITAL_COMMUNITY): Payer: Medicare Other

## 2023-08-25 ENCOUNTER — Other Ambulatory Visit

## 2023-08-25 ENCOUNTER — Encounter: Payer: Self-pay | Admitting: Adult Health

## 2023-08-25 ENCOUNTER — Encounter: Payer: Self-pay | Admitting: Physician Assistant

## 2023-08-25 ENCOUNTER — Inpatient Hospital Stay (HOSPITAL_BASED_OUTPATIENT_CLINIC_OR_DEPARTMENT_OTHER): Admitting: Physician Assistant

## 2023-08-25 VITALS — BP 99/67 | HR 87 | Temp 97.3°F | Resp 15 | Wt 276.2 lb

## 2023-08-25 DIAGNOSIS — Z7901 Long term (current) use of anticoagulants: Secondary | ICD-10-CM | POA: Diagnosis not present

## 2023-08-25 DIAGNOSIS — D8989 Other specified disorders involving the immune mechanism, not elsewhere classified: Secondary | ICD-10-CM

## 2023-08-25 DIAGNOSIS — Z8673 Personal history of transient ischemic attack (TIA), and cerebral infarction without residual deficits: Secondary | ICD-10-CM | POA: Diagnosis not present

## 2023-08-25 DIAGNOSIS — D472 Monoclonal gammopathy: Secondary | ICD-10-CM

## 2023-08-25 LAB — CBC WITH DIFFERENTIAL (CANCER CENTER ONLY)
Abs Immature Granulocytes: 0.01 10*3/uL (ref 0.00–0.07)
Basophils Absolute: 0 10*3/uL (ref 0.0–0.1)
Basophils Relative: 0 %
Eosinophils Absolute: 0.1 10*3/uL (ref 0.0–0.5)
Eosinophils Relative: 1 %
HCT: 46.7 % (ref 39.0–52.0)
Hemoglobin: 15.3 g/dL (ref 13.0–17.0)
Immature Granulocytes: 0 %
Lymphocytes Relative: 31 %
Lymphs Abs: 2.2 10*3/uL (ref 0.7–4.0)
MCH: 31 pg (ref 26.0–34.0)
MCHC: 32.8 g/dL (ref 30.0–36.0)
MCV: 94.7 fL (ref 80.0–100.0)
Monocytes Absolute: 0.4 10*3/uL (ref 0.1–1.0)
Monocytes Relative: 6 %
Neutro Abs: 4.4 10*3/uL (ref 1.7–7.7)
Neutrophils Relative %: 62 %
Platelet Count: 198 10*3/uL (ref 150–400)
RBC: 4.93 MIL/uL (ref 4.22–5.81)
RDW: 13.4 % (ref 11.5–15.5)
WBC Count: 7.1 10*3/uL (ref 4.0–10.5)
nRBC: 0 % (ref 0.0–0.2)

## 2023-08-25 LAB — CMP (CANCER CENTER ONLY)
ALT: 43 U/L (ref 0–44)
AST: 45 U/L — ABNORMAL HIGH (ref 15–41)
Albumin: 2.2 g/dL — ABNORMAL LOW (ref 3.5–5.0)
Alkaline Phosphatase: 69 U/L (ref 38–126)
Anion gap: 1 — ABNORMAL LOW (ref 5–15)
BUN: 13 mg/dL (ref 6–20)
CO2: 37 mmol/L — ABNORMAL HIGH (ref 22–32)
Calcium: 8 mg/dL — ABNORMAL LOW (ref 8.9–10.3)
Chloride: 103 mmol/L (ref 98–111)
Creatinine: 0.86 mg/dL (ref 0.61–1.24)
GFR, Estimated: >60 mL/min (ref >60)
Glucose, Bld: 94 mg/dL (ref 70–99)
Potassium: 4.3 mmol/L (ref 3.5–5.1)
Sodium: 141 mmol/L (ref 135–145)
Total Bilirubin: 0.3 mg/dL (ref 0.0–1.2)
Total Protein: 4.7 g/dL — ABNORMAL LOW (ref 6.5–8.1)

## 2023-08-25 NOTE — Progress Notes (Signed)
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 Ao Root diam: 3.80 cm Ao Asc diam:  3.60 cm MITRAL VALVE MV Area (PHT): 5.23 cm    SHUNTS MV Decel Time: 145 msec    Systemic VTI:  0.25 m MV E velocity: 56.60 cm/s  Systemic Diam: 2.20 cm MV A velocity: 72.30 cm/s MV E/A ratio:  0.78 Mihai Croitoru MD Electronically signed by Thurmon Fair MD Signature Date/Time: 08/15/2023/1:25:44 PM    Final     ASSESSMENT:  Joseph [name]. Is a 58 y.o. 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 The mass spectrometry sample indeterminate for amyloid and could not subtype the type of amyloidosis. -Labs from 06/01/2023 which included SPEP was negative  for monoclonal protein. UPEP was positive for bence jones protein, lambda type.  -Labs from 08/22/2023 showed no evidence of cytopenias, renal dysfunction or hypercalcemia.  PLAN: -Labs today to check CBC, CMP, SPEP/IFE, serum free light chains and 24 hour UPEP -Will obtain bone met survey to evaluate for lytic lesions -Ordered bone marrow biopsy and aspiration to evaluate to rule out AL amyloidosis or other plasma cell dyscrasias. -Tentative plan to follow up one week after bone marrow biopsy to review results.   # Nephrotic range proteinuria: -Etiology unclear. Plan to rule out paraproteinemia with above workup. -Recommend to consider renal biopsy if hematologic workup is negative.   # Recent recurrent CVA: --Currently ASA and plavix per neurology, recommend to continue.   #History of HOCM and persistent hypotension --Under the care of Dr. 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    Preferred location?:   [facility]   CBC with Differential (Cancer Center Only)    Standing Status:   Future    Number of Occurrences:   1    Expiration Date:   08/24/2024   CMP (Cancer Center only)    Standing Status:   Future    Number of Occurrences:   1    Expiration Date:   08/24/2024   Multiple Myeloma Panel (SPEP&IFE w/QIG)    Standing Status:   Future    Number of Occurrences:   1    Expiration Date:   08/24/2024    Kappa/lambda light chains    Standing Status:   Future    Number of Occurrences:   1    Expiration Date:   08/24/2024   24-Hr Ur UPEP/UIFE/Light Chains/TP    Standing Status:   Future    Expiration Date:   08/24/2024    All questions were answered. The patient knows to call the clinic with any problems, questions or concerns.  I have spent a total of 60 minutes minutes of face-to-face and non-face-to-face time, preparing to see the patient, obtaining and/or reviewing separately obtained history, performing a medically appropriate examination, counseling and educating the patient, ordering medications/tests/procedures, referring and communicating with other health care professionals, documenting clinical information in the electronic health record, independently interpreting results and communicating results to the patient, and care coordination.   Georga Kaufmann, PA-C Department of Hematology/Oncology [facility] Phone: [phone]  Patient was seen with Dr. Candise Che.   I have read the above note and personally examined the patient. I agree with the assessment and  plan as noted above.  PLAN -labs for evaluation of plasma cell dyscrasia 24h UPEP with IFE and FLC -whole body skeletal survey -CT guided bone marrow aspiration and biopsy for evaluation AL Amyloidosis in the context of possible cardiac amyloidosis and nephrotic syndrome -continue f/u with cardiology and neurology. -cardiology could consider PYP scan -if hematologic w/u is indeterminate for AL amyloidosis patient might need renal bx and also rpt Mass spectrometry on his cardiac surgical sample to characterize the Amyloid protein.  All of the patient's questions were answered with apparent satisfaction. The patient knows to call the clinic with any problems, questions or concerns.  Wyvonnia Lora MD MS AAHIVMS [facility] Hematology/Oncology Physician [facility]

## 2023-08-27 LAB — CULTURE, BLOOD (ROUTINE X 2)
Culture: NO GROWTH
Culture: NO GROWTH

## 2023-08-28 ENCOUNTER — Encounter (HOSPITAL_COMMUNITY): Payer: Medicare Other

## 2023-08-28 ENCOUNTER — Encounter (HOSPITAL_COMMUNITY): Payer: Self-pay | Admitting: *Deleted

## 2023-08-28 DIAGNOSIS — Z951 Presence of aortocoronary bypass graft: Secondary | ICD-10-CM

## 2023-08-28 DIAGNOSIS — Z9889 Other specified postprocedural states: Secondary | ICD-10-CM

## 2023-08-28 LAB — MULTIPLE MYELOMA PANEL, SERUM
Albumin SerPl Elph-Mcnc: 1.9 g/dL — ABNORMAL LOW (ref 2.9–4.4)
Albumin/Glob SerPl: 0.8 (ref 0.7–1.7)
Alpha 1: 0.1 g/dL (ref 0.0–0.4)
Alpha2 Glob SerPl Elph-Mcnc: 1.2 g/dL — ABNORMAL HIGH (ref 0.4–1.0)
B-Globulin SerPl Elph-Mcnc: 0.8 g/dL (ref 0.7–1.3)
Gamma Glob SerPl Elph-Mcnc: 0.4 g/dL (ref 0.4–1.8)
Globulin, Total: 2.5 g/dL (ref 2.2–3.9)
IgA: 153 mg/dL (ref 90–386)
IgG (Immunoglobin G), Serum: 508 mg/dL — ABNORMAL LOW (ref 603–1613)
IgM (Immunoglobulin M), Srm: 99 mg/dL (ref 20–172)
Total Protein ELP: 4.4 g/dL — ABNORMAL LOW (ref 6.0–8.5)

## 2023-08-28 LAB — KAPPA/LAMBDA LIGHT CHAINS
Kappa free light chain: 17.2 mg/L (ref 3.3–19.4)
Kappa, lambda light chain ratio: 0.25 — ABNORMAL LOW (ref 0.26–1.65)
Lambda free light chains: 68.3 mg/L — ABNORMAL HIGH (ref 5.7–26.3)

## 2023-08-28 NOTE — Progress Notes (Signed)
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 and how cholesterol levels can be controlled.   Nutrition II: Labels -Discuss the different components of food labels and how to read food label   Heart Parts/Heart Disease and PAD -Discuss the anatomy of the heart, the pathway of blood circulation through the heart, and these are affected by heart disease.   Stress I: Signs and Symptoms -Discuss the causes of stress, how stress may lead to anxiety and depression, and ways to limit stress.  Stress II: Relaxation -Discuss different types of relaxation techniques to limit stress.   Warning Signs of Stroke / TIA -Discuss definition of a stroke, what the signs and symptoms are of a stroke, and how to identify when someone is having stroke.   Knowledge Questionnaire Score:  Knowledge Questionnaire  Score - 06/27/23 1545       Knowledge Questionnaire Score   Pre Score 21/26             Core Components/Risk Factors/Patient Goals at Admission:  Personal Goals and Risk Factors at Admission - 06/27/23 1545       Core Components/Risk Factors/Patient Goals on Admission    Weight Management Yes;Obesity;Weight Loss    Intervention Weight Management: Develop a combined nutrition and exercise program designed to reach desired caloric intake, while maintaining appropriate intake of nutrient and fiber, sodium and fats, and appropriate energy expenditure required for the weight goal.;Weight Management: Provide education and appropriate resources to help participant work on and attain dietary goals.;Weight Management/Obesity: Establish reasonable short term and long term weight goals.;Obesity: Provide education and appropriate resources to help participant work on and attain dietary goals.    Admit Weight 271 lb 14.4 oz (123.3 kg)    Goal Weight: Short Term 265 lb (120.2 kg)    Goal Weight: Long Term 259 lb (117.5 kg)    Expected Outcomes Short Term: Continue to assess and modify interventions until short term weight is achieved;Long Term: Adherence to nutrition and physical activity/exercise program aimed toward attainment of established weight goal;Weight Loss: Understanding of general recommendations for a balanced deficit meal plan, which promotes 1-2 lb weight loss per week and includes a negative energy balance of [phone] kcal/d;Understanding recommendations for meals to include 15-35% energy as protein, 25-35% energy from fat, 35-60% energy from carbohydrates, less than 200mg  of dietary cholesterol, 20-35 gm of total fiber daily;Understanding of distribution of calorie intake throughout the day with the consumption of 4-5 meals/snacks    Hypertension Yes    Intervention Provide education on lifestyle modifcations including regular physical activity/exercise, weight management, moderate sodium  restriction and increased consumption of fresh fruit, vegetables, and low fat dairy, alcohol moderation, and smoking cessation.;Monitor prescription use compliance.    Expected Outcomes Short Term: Continued assessment and intervention until BP is < 140/103mm HG in hypertensive participants. < 130/32mm HG in hypertensive participants with diabetes, heart failure or chronic kidney disease.;Long Term: Maintenance of blood pressure at goal levels.    Lipids Yes    Intervention Provide education and support for participant on nutrition & aerobic/resistive exercise along with prescribed medications to achieve LDL 70mg , HDL >40mg .    Expected Outcomes Short Term: Participant states understanding of desired cholesterol values and is compliant with medications prescribed. Participant is following exercise prescription and nutrition guidelines.;Long Term: Cholesterol controlled with medications as prescribed, with individualized exercise RX and with personalized nutrition plan. Value goals: LDL < 70mg , HDL > 40 mg.             Core Components/Risk Factors/Patient Goals Review:    Core Components/Risk Factors/Patient Goals at Discharge (Final Review):    ITP Comments:  ITP Comments     Row Name 06/27/23 1520 06/30/23 0926 07/19/23 1117 08/07/23 1258 08/07/23 1551   ITP Comments Patient attend orientation today.  Patient is attending Cardiac Rehabilitation Program.  Documentation for diagnosis can be found in [facility] Admission 05/11/23.  Reviewed medical chart, RPE/RPD, gym safety, and program guidelines.  Patient was fitted to equipment they will  be using during rehab.  Patient is scheduled to start exercise on Friday 06/30/23 at 915.   Initial ITP created and sent for review and signature by Dr. Dina Rich, Medical Director for Cardiac Rehabilitation Program. First full day of exercise!  Patient was oriented to gym and equipment including functions, settings, policies, and procedures.   Patient's individual exercise prescription and treatment plan were reviewed.  All starting workloads were established based on the results of the 6 minute walk test done at initial orientation visit.  The plan for exercise progression was also introduced and progression will be customized based on patient's performance and goals. 30 day review completed. ITP sent to Dr. Dina Rich, Medical Director of Cardiac Rehab. Continue with ITP unless changes are made by physician.  Newer to program, switched class times as he was having a hard time with morning. Patient's wife, Efraim Kaufmann, called to let us know that Joseph [name] was not feeling well again today with his blood pressure.  Last Wednesday, it dropped into the low 80s in class but we were able to get it above 90 to allow him to leave.  Today, his wife said his blood pressure was 139/84 this morning and they withheld his blood pressure meds, but by lunch time he had dropped to 94/60s.  He is complaining of feeling lightheaded and dizzy.  She is going to call the cardiologist office to see if they can get in to be seen prior to his appointment next week. They will be out the rest of the week until he feels better and the weather improves. Note from Dr. Izora Ribas: Thanks for letting me know.   He has has some low blood pressures, most prominent post myectomy. I recommend holding his metoprolol. If his blood pressure is still very low, he should follow up with with pain management MD as they med need to wean off some of his other medications.  Thanks, MAC: Patient notified.    Row Name 08/16/23 0846 08/28/23 0849         ITP Comments 30 day review completed. ITP sent to Dr. Dina Rich, Medical Director of Cardiac Rehab. Continue with ITP unless changes are made by physician.    Pt has been out since 08/01/22 with his BP continuing to drop.  He had echo done yesterday, waiting for results and ability to return to rehab. Unable to assess for goals as BP has been  dropping in class. Pt's wife called to let us know that he continues to have issues with his BP dropping.  They also met with his oncologist last week and he continues to have other ongoing medical issues as well.  After discussion with oncologist last week, they felt it was best for him to discharge from the program at this time.               Comments: Discharge ITP

## 2023-08-28 NOTE — Progress Notes (Signed)
 Discharge Progress Report  Patient Details  Name: Joseph [name]. MRN: [id number] Date of Birth: Jun 23, 1965 Referring Provider:   Flowsheet Row CARDIAC REHAB PHASE II ORIENTATION from 06/27/2023 in [facility] CARD[facility]AC [facility]ATION  Referring Provider Tonny Bollman MD        Number of Visits: 12  Reason for Discharge:  Early Exit:  Personal and Physician Advice  Smoking History:  Social History   Tobacco Use  Smoking Status Former   Current packs/day: 0.00   Average packs/day: 1.5 packs/day for 15.0 years (22.5 ttl pk-yrs)   Types: Cigarettes   Start date: 04/20/1988   Quit date: 04/21/2003   Years since quitting: 20.3  Smokeless Tobacco Never  Tobacco Comments   Pt states that he chews on cigars.    Diagnosis:  S/P CABG x 1  S/P ventricular septal myectomy  ADL UCSD:   Initial Exercise Prescription:  Initial Exercise Prescription - 06/27/23 1500       Date of Initial Exercise RX and Referring Provider   Date 06/27/23    Referring Provider Tonny Bollman MD      Oxygen   Maintain Oxygen Saturation 88% or higher      Treadmill   MPH 1.5    Grade 0.5    Minutes 15    METs 2.25      NuStep   Level 3    SPM 80    Minutes 15    METs 2      Prescription Details   Frequency (times per week) 3    Duration Progress to 30 minutes of continuous aerobic without signs/symptoms of physical distress      Intensity   THRR 40-80% of Max Heartrate 106-142    Ratings of Perceived Exertion 11-13    Perceived Dyspnea 0-4      Progression   Progression Continue to progress workloads to maintain intensity without signs/symptoms of physical distress.      Resistance Training   Training Prescription Yes    Weight 3 lb    Reps 10-15             Discharge Exercise Prescription (Final Exercise Prescription Changes):  Exercise Prescription Changes - 07/26/23 1500       Response to Exercise   Blood Pressure (Admit) 120/68    Blood Pressure (Exit) 110/68     Heart Rate (Admit) 71 bpm    Heart Rate (Exercise) 90 bpm    Heart Rate (Exit) 82 bpm    Rating of Perceived Exertion (Exercise) 13    Duration Continue with 30 min of aerobic exercise without signs/symptoms of physical distress.    Intensity THRR unchanged      Progression   Progression Continue to progress workloads to maintain intensity without signs/symptoms of physical distress.      Resistance Training   Training Prescription Yes    Weight 3    Reps 10-15      Treadmill   MPH 2    Grade 1.5    Minutes 15    METs 2.95      NuStep   Level 6    SPM 78    Minutes 15    METs 2.8      Oxygen   Maintain Oxygen Saturation 88% or higher             Functional Capacity:  6 Minute Walk     Row Name 06/27/23 1532         6 Minute  Walk   Phase Initial     Distance 855 feet     Walk Time 6 minutes     # of Rest Breaks 0     MPH 1.62     METS 1.88     RPE 12     Perceived Dyspnea  2     VO2 Peak 6.58     Symptoms Yes (comment)     Comments SOB, fatigue     Resting HR 70 bpm     Resting BP 94/62     Resting Oxygen Saturation  92 %     Exercise Oxygen Saturation  during 6 min walk 93 %     Max Ex. HR 81 bpm     Max Ex. BP 124/68     2 Minute Post BP 116/58              Psychological, QOL, Others - Outcomes: PHQ 2/9:    06/27/2023    1:16 PM  Depression screen PHQ 2/9  Decreased Interest 0  Down, Depressed, Hopeless 0  PHQ - 2 Score 0  Altered sleeping 0  Tired, decreased energy 3  Change in appetite 3  Feeling bad or failure about yourself  0  Trouble concentrating 2  Moving slowly or fidgety/restless 1  Suicidal thoughts 0  PHQ-9 Score 9  Difficult doing work/chores Very difficult    Quality of Life:  Quality of Life - 06/27/23 1544       Quality of Life   Select Quality of Life      Quality of Life Scores   Health/Function Pre 25.37 %    Socioeconomic Pre 23.63 %    Psych/Spiritual Pre 25.29 %    Family Pre 28.8 %    GLOBAL  Pre 21.16 %             Nutrition & Weight - Outcomes:  Pre Biometrics - 06/27/23 1535       Pre Biometrics   Height 5' 11.75" (1.822 m)    Weight 271 lb 14.4 oz (123.3 kg)    Waist Circumference 46 inches    Hip Circumference 43.5 inches    Waist to Hip Ratio 1.06 %    BMI (Calculated) 37.15    Grip Strength 26 kg

## 2023-08-28 NOTE — Telephone Encounter (Signed)
 I called pt, spoke to wife.  He is back on the plavix and aspirin. (This after her speaking with oncologist (not as a order)).  He has appt with cardiologist 09/01/2023 ([facility] Dr. Izora Ribas Dr. Theresia Bough- amyloid deposits). If depending on blood and 24 hr urine for amyloid deposits he may need bone marrow biopsy which does not need to be off plavix. (This test result can take 2 wks).  Has kidney consult 09/05/23  and if needs renal biopsy will need to be off plavix.  So due to time frame plavix and aspirin restarted.  I relayed that per [facility] NP for procedures wusually require 3-5 days off of plavix.  He is to start 30 day cardiac monitor for  r/o afib thru [facility].  He mentioned that his focus and concentration seems to be worse, if continues she would like another MRI.

## 2023-08-28 NOTE — Telephone Encounter (Signed)
 We typically recommend holding Plavix 3-5 days prior to the procedure.  Andrey Campanile can you check the notes and find out if there is any additional reason that they may have wanted to stop Plavix?  I have also tagged Dr. Pearlean Brownie in this so he can weigh in as well

## 2023-08-29 ENCOUNTER — Telehealth: Payer: Self-pay | Admitting: Hematology

## 2023-08-29 ENCOUNTER — Other Ambulatory Visit (HOSPITAL_COMMUNITY)

## 2023-08-29 ENCOUNTER — Other Ambulatory Visit (HOSPITAL_COMMUNITY)
Admission: RE | Admit: 2023-08-29 | Discharge: 2023-08-29 | Disposition: A | Discharge status: Home / Self Care | Source: Ambulatory Visit | Attending: Physician Assistant | Admitting: Physician Assistant

## 2023-08-29 DIAGNOSIS — D8989 Other specified disorders involving the immune mechanism, not elsewhere classified: Secondary | ICD-10-CM | POA: Insufficient documentation

## 2023-08-29 NOTE — Telephone Encounter (Signed)
 Can we make sure his next appointment is with Dr. Pearlean Brownie instead of me.

## 2023-08-29 NOTE — Telephone Encounter (Signed)
 Spoke with patient wife confirming upcoming appointment

## 2023-08-29 NOTE — Telephone Encounter (Signed)
 Appt changed to Dr. Pearlean Brownie, same date/time.

## 2023-08-30 ENCOUNTER — Encounter (HOSPITAL_COMMUNITY): Payer: Medicare Other

## 2023-08-31 DIAGNOSIS — N049 Nephrotic syndrome with unspecified morphologic changes: Secondary | ICD-10-CM | POA: Diagnosis not present

## 2023-08-31 DIAGNOSIS — I480 Paroxysmal atrial fibrillation: Secondary | ICD-10-CM

## 2023-08-31 NOTE — Progress Notes (Signed)
 ADVANCED HEART FAILURE NEW PATIENT CLINIC NOTE  Referring Physician: Richardean Chimera, MD  Primary Care: Richardean Chimera, MD Primary Cardiologist:  HPI: Joseph [name]. is a 59 y.o. male with a PMH of alpha gal, hypertrophic cardiomyopathy who presents for initial visit for further evaluation and treatment of heart failure/cardiomyopathy.      Patient has a fairly complex medical history.  Notable history of coronary artery disease with PCI to the LAD in 2015, moderate RCA stenosis at the time.  He established care with [facility] cardiology in 2023 for HCM and was trialed on mavacamten for worsening obstructive symptoms.  he reported that the medication interfere with his pain medication and so he was referred to [facility] clinic for septal myectomy.  Procedure was complicated by postoperative atrial fibrillation, likely TIA/CVA, but otherwise good result with improvement in gradient.  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  Mass spectroscopy was not able to identify the dominant amyloid protein.  Since that time he was admitted in December for CVA and was placed on aspirin/Plavix.  Additionally, he was admitted in March for symptomatic hypotension.  All blood pressure medications were held with improvement.  He was also found to have new nephrotic syndrome and follow-up was established with nephrology.     SUBJECTIVE: Since his admission, the patient reports ongoing weakness, dizziness with standing, as well as occasional low blood pressures.  The majority of his blood pressures have been stable, and his wife reports that when she gives him a bag of salty chips his blood pressure improves.  He has lower extremity swelling that is slightly increased from baseline, which worsens throughout the day and improves after elevating his feet at night.  He reports that the dizziness is worst immediately upon  standing, but starts to improve the longer he moves around.  She also reports ongoing sweating, usually associated with lower blood pressures and feeling poorly.  Patient denies any cardiac medical history.  He denies any family members with sudden cardiac death or defibrillators in place.  Family history of malignancy, but none of them bone marrow related.  Denies any personal history of rheumatic disease.  No ongoing fever, chills.  PMH, current medications, allergies, social history, and family history reviewed in epic.  PHYSICAL EXAM: Vitals:   09/01/23 0848  BP: (!) 80/40  Pulse: 79  SpO2: 90%   GENERAL: Ill-appearing PULM:  Normal work of breathing, clear to auscultation bilaterally. Respirations are unlabored.  CARDIAC:  JVP: Mildly elevated         Normal rate and rhythm, systolic murmur, 1+ lower extremity edema, worse on the right, 1 extremities ABDOMEN: Soft, non-tender, non-distended. NEUROLOGIC: Patient is oriented x3 with no focal or lateralizing neurologic deficits.      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 Has since has rapidly progressive symptoms of autonomic dysfunction, neuropathy, and nephrotic syndrome. 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 Also has a strong predilection for the renal parenchyma, relevant given recent nephrotic syndrome. Discussed the natural history of this disease, better overall prognosis than aTTR/AL amyloid, but lack of treatment options.  Tissue diagnosis will be imperative in this case. Given renal involvement, would hopefully be able to arrange renal biopsy at upcoming nephrology appointment. Would also  recommend specimen to be sent to [facility] for mass spectroscopy after obtaining. K/L ratio is low at 0.25.  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 If tissue diagnosis is not obtained from renal/bone marrow, will arrange for endomyocardial biopsy at [facility] at his follow up. Discussed at length with the patient.  Given the majority of normal BP at home, midodrine unlikely to be beneficial. Agree with sodium supplementation as needed. Fludocortisone likely to worsen baseline edema. Discussed case with HF team, will also reach out to Dr. Carilyn Goodpasture at [facility] for any additional recommendations.    I spent 85 minutes caring for this patient today including face to face time, ordering and reviewing labs, reviewing records from [facility], [facility], [facility], seeing the patient, documenting in the record,  discussing with treating physicians and arranging follow ups.   Follow up in 6 weeks  Clearnce Hasten, MD Advanced Heart Failure Mechanical Circulatory Support 09/02/23

## 2023-09-01 ENCOUNTER — Encounter (HOSPITAL_COMMUNITY): Payer: Self-pay | Admitting: Cardiology

## 2023-09-01 ENCOUNTER — Ambulatory Visit (HOSPITAL_COMMUNITY)
Admit: 2023-09-01 | Discharge: 2023-09-01 | Disposition: A | Discharge status: Home / Self Care | Attending: Cardiology | Admitting: Cardiology

## 2023-09-01 ENCOUNTER — Ambulatory Visit (HOSPITAL_COMMUNITY)
Admission: RE | Admit: 2023-09-01 | Discharge: 2023-09-01 | Disposition: A | Discharge status: Home / Self Care | Source: Ambulatory Visit | Attending: Hematology | Admitting: Hematology

## 2023-09-01 ENCOUNTER — Encounter (HOSPITAL_COMMUNITY): Payer: Medicare Other

## 2023-09-01 VITALS — BP 80/40 | HR 79 | Wt 277.8 lb

## 2023-09-01 DIAGNOSIS — I2510 Atherosclerotic heart disease of native coronary artery without angina pectoris: Secondary | ICD-10-CM | POA: Diagnosis not present

## 2023-09-01 DIAGNOSIS — Z8673 Personal history of transient ischemic attack (TIA), and cerebral infarction without residual deficits: Secondary | ICD-10-CM | POA: Insufficient documentation

## 2023-09-01 DIAGNOSIS — I43 Cardiomyopathy in diseases classified elsewhere: Secondary | ICD-10-CM | POA: Insufficient documentation

## 2023-09-01 DIAGNOSIS — D8989 Other specified disorders involving the immune mechanism, not elsewhere classified: Secondary | ICD-10-CM | POA: Insufficient documentation

## 2023-09-01 DIAGNOSIS — N049 Nephrotic syndrome with unspecified morphologic changes: Secondary | ICD-10-CM | POA: Insufficient documentation

## 2023-09-01 DIAGNOSIS — E854 Organ-limited amyloidosis: Secondary | ICD-10-CM | POA: Diagnosis not present

## 2023-09-01 DIAGNOSIS — G629 Polyneuropathy, unspecified: Secondary | ICD-10-CM | POA: Insufficient documentation

## 2023-09-01 DIAGNOSIS — I480 Paroxysmal atrial fibrillation: Secondary | ICD-10-CM | POA: Diagnosis not present

## 2023-09-01 DIAGNOSIS — N200 Calculus of kidney: Secondary | ICD-10-CM | POA: Diagnosis not present

## 2023-09-01 DIAGNOSIS — I422 Other hypertrophic cardiomyopathy: Secondary | ICD-10-CM | POA: Insufficient documentation

## 2023-09-01 DIAGNOSIS — Z955 Presence of coronary angioplasty implant and graft: Secondary | ICD-10-CM | POA: Insufficient documentation

## 2023-09-01 LAB — C-REACTIVE PROTEIN: CRP: <0.5 mg/dL (ref <1.0)

## 2023-09-01 LAB — CORTISOL: Cortisol, Plasma: 7.7 ug/dL

## 2023-09-01 LAB — SEDIMENTATION RATE: Sed Rate: 20 mm/h — ABNORMAL HIGH (ref 0–16)

## 2023-09-01 NOTE — Patient Instructions (Signed)
 There has been no changes to your medications.  Labs done today, your results will be available in MyChart, we will contact you for abnormal readings.  Genetic testing has been collected, this has to be sent to [facility] for processing and can take 1-2 weeks for [place] to get results back.  We will let you know the results once reviewed by your provider.   Your physician recommends that you schedule a follow-up appointment in: as scheduled.  If you have any questions or concerns before your next appointment please send [place] a message through [place] or call our office at [phone].    TO LEAVE A MESSAGE FOR THE NURSE SELECT OPTION 2, PLEASE LEAVE A MESSAGE INCLUDING: YOUR NAME DATE OF BIRTH CALL BACK NUMBER REASON FOR CALL**this is important as we prioritize the call backs  YOU WILL RECEIVE A CALL BACK THE SAME DAY AS LONG AS YOU CALL BEFORE 4:00 PM  At the [facility], you and your health needs are our priority. As part of our continuing mission to provide you with exceptional heart care, we have created designated Provider Care Teams. These Care Teams include your primary Cardiologist (physician) and Advanced Practice Providers (APPs- Physician Assistants and Nurse Practitioners) who all work together to provide you with the care you need, when you need it.   You may see any of the following providers on your designated Care Team at your next follow up: Dr Arvilla Meres Dr Marca Ancona Dr. Dorthula Nettles Dr. Clearnce Hasten Amy Filbert Schilder, NP Robbie Lis, [place] [facility] [place], [place] Brynda Peon, NP [place] Lee, NP Clarisa Kindred, NP Karle Plumber, PharmD Enos Fling, PharmD   Please be sure to bring in all your medications bottles to every appointment.    Thank you for choosing [facility]

## 2023-09-01 NOTE — Progress Notes (Signed)
 Cardiomyopathy  genetic testing collected via blood  per Dr Elwyn Lade.  Order form completed, signed and shipped with sample by FedEx to [facility].

## 2023-09-04 ENCOUNTER — Encounter (HOSPITAL_COMMUNITY): Payer: Medicare Other

## 2023-09-04 LAB — UPEP/UIFE/LIGHT CHAINS/TP, 24-HR UR
% BETA, Urine: 11.3 %
ALPHA 1 URINE: 5.4 %
Albumin, U: 78.6 %
Alpha 2, Urine: 2.9 %
Free Kappa Lt Chains,Ur: 25.41 mg/L (ref 1.17–86.46)
Free Kappa/Lambda Ratio: 0.44 — ABNORMAL LOW (ref 1.83–14.26)
Free Lambda Lt Chains,Ur: 58.4 mg/L — ABNORMAL HIGH (ref 0.27–15.21)
GAMMA GLOBULIN URINE: 1.7 %
Total Protein, Urine-Ur/day: 8427 mg/(24.h) — ABNORMAL HIGH (ref 30–150)
Total Protein, Urine: 543.7 mg/dL
Total Volume: 1550

## 2023-09-05 DIAGNOSIS — I421 Obstructive hypertrophic cardiomyopathy: Secondary | ICD-10-CM | POA: Diagnosis not present

## 2023-09-05 DIAGNOSIS — I959 Hypotension, unspecified: Secondary | ICD-10-CM | POA: Diagnosis not present

## 2023-09-05 DIAGNOSIS — E854 Organ-limited amyloidosis: Secondary | ICD-10-CM | POA: Diagnosis not present

## 2023-09-05 DIAGNOSIS — I43 Cardiomyopathy in diseases classified elsewhere: Secondary | ICD-10-CM | POA: Diagnosis not present

## 2023-09-05 DIAGNOSIS — I97820 Postprocedural cerebrovascular infarction during cardiac surgery: Secondary | ICD-10-CM | POA: Diagnosis not present

## 2023-09-05 DIAGNOSIS — N049 Nephrotic syndrome with unspecified morphologic changes: Secondary | ICD-10-CM | POA: Diagnosis not present

## 2023-09-05 DIAGNOSIS — I951 Orthostatic hypotension: Secondary | ICD-10-CM | POA: Diagnosis not present

## 2023-09-06 ENCOUNTER — Encounter (HOSPITAL_COMMUNITY): Payer: Medicare Other

## 2023-09-07 NOTE — Progress Notes (Signed)
 Received referral from Dr Kalman Drape office. Appointment scheduled with Karena Addison.

## 2023-09-08 ENCOUNTER — Encounter (HOSPITAL_COMMUNITY): Payer: Medicare Other

## 2023-09-11 ENCOUNTER — Encounter (HOSPITAL_COMMUNITY): Payer: Medicare Other

## 2023-09-13 ENCOUNTER — Encounter (HOSPITAL_COMMUNITY): Payer: Medicare Other

## 2023-09-15 ENCOUNTER — Encounter (HOSPITAL_COMMUNITY): Payer: Medicare Other

## 2023-09-15 ENCOUNTER — Ambulatory Visit: Attending: Internal Medicine

## 2023-09-15 DIAGNOSIS — I480 Paroxysmal atrial fibrillation: Secondary | ICD-10-CM

## 2023-09-18 ENCOUNTER — Encounter: Payer: Self-pay | Admitting: Internal Medicine

## 2023-09-18 ENCOUNTER — Encounter (HOSPITAL_COMMUNITY): Payer: Medicare Other

## 2023-09-18 DIAGNOSIS — D696 Thrombocytopenia, unspecified: Secondary | ICD-10-CM | POA: Diagnosis not present

## 2023-09-18 DIAGNOSIS — E039 Hypothyroidism, unspecified: Secondary | ICD-10-CM | POA: Diagnosis not present

## 2023-09-18 DIAGNOSIS — E782 Mixed hyperlipidemia: Secondary | ICD-10-CM | POA: Diagnosis not present

## 2023-09-18 DIAGNOSIS — E1122 Type 2 diabetes mellitus with diabetic chronic kidney disease: Secondary | ICD-10-CM | POA: Diagnosis not present

## 2023-09-20 ENCOUNTER — Telehealth: Payer: Self-pay | Admitting: *Deleted

## 2023-09-20 ENCOUNTER — Other Ambulatory Visit: Payer: Self-pay | Admitting: *Deleted

## 2023-09-20 ENCOUNTER — Encounter (HOSPITAL_COMMUNITY): Payer: Medicare Other

## 2023-09-20 MED ORDER — ALFUZOSIN HCL ER 10 MG PO TB24: 10.0000 mg | ORAL_TABLET | Freq: Every day | ORAL | 2 refills | Status: DC

## 2023-09-20 NOTE — Telephone Encounter (Signed)
 Talked with wife and she states to send Alfuzosin to [place] in [place] .

## 2023-09-22 ENCOUNTER — Encounter (HOSPITAL_COMMUNITY): Payer: Medicare Other

## 2023-09-25 ENCOUNTER — Encounter (HOSPITAL_COMMUNITY): Payer: Medicare Other

## 2023-09-27 ENCOUNTER — Other Ambulatory Visit: Payer: Self-pay | Admitting: Radiology

## 2023-09-27 DIAGNOSIS — Z743 Need for continuous supervision: Secondary | ICD-10-CM | POA: Diagnosis not present

## 2023-09-27 DIAGNOSIS — R231 Pallor: Secondary | ICD-10-CM | POA: Diagnosis not present

## 2023-09-27 DIAGNOSIS — R404 Transient alteration of awareness: Secondary | ICD-10-CM | POA: Diagnosis not present

## 2023-09-27 DIAGNOSIS — D8989 Other specified disorders involving the immune mechanism, not elsewhere classified: Secondary | ICD-10-CM

## 2023-09-28 ENCOUNTER — Other Ambulatory Visit: Payer: Self-pay

## 2023-09-28 ENCOUNTER — Encounter (HOSPITAL_COMMUNITY): Payer: Self-pay

## 2023-09-28 ENCOUNTER — Ambulatory Visit (HOSPITAL_COMMUNITY)
Admission: RE | Admit: 2023-09-28 | Discharge: 2023-09-28 | Disposition: A | Discharge status: Home / Self Care | Source: Ambulatory Visit | Attending: Hematology | Admitting: Hematology

## 2023-09-28 ENCOUNTER — Telehealth (HOSPITAL_COMMUNITY)

## 2023-09-28 DIAGNOSIS — D4989 Neoplasm of unspecified behavior of other specified sites: Secondary | ICD-10-CM | POA: Diagnosis not present

## 2023-09-28 DIAGNOSIS — Z8673 Personal history of transient ischemic attack (TIA), and cerebral infarction without residual deficits: Secondary | ICD-10-CM | POA: Insufficient documentation

## 2023-09-28 DIAGNOSIS — I421 Obstructive hypertrophic cardiomyopathy: Secondary | ICD-10-CM | POA: Insufficient documentation

## 2023-09-28 DIAGNOSIS — I43 Cardiomyopathy in diseases classified elsewhere: Secondary | ICD-10-CM | POA: Insufficient documentation

## 2023-09-28 DIAGNOSIS — J123 Human metapneumovirus pneumonia: Secondary | ICD-10-CM | POA: Diagnosis not present

## 2023-09-28 DIAGNOSIS — N400 Enlarged prostate without lower urinary tract symptoms: Secondary | ICD-10-CM | POA: Insufficient documentation

## 2023-09-28 DIAGNOSIS — D7589 Other specified diseases of blood and blood-forming organs: Secondary | ICD-10-CM | POA: Diagnosis not present

## 2023-09-28 DIAGNOSIS — R809 Proteinuria, unspecified: Secondary | ICD-10-CM | POA: Insufficient documentation

## 2023-09-28 DIAGNOSIS — E785 Hyperlipidemia, unspecified: Secondary | ICD-10-CM | POA: Insufficient documentation

## 2023-09-28 DIAGNOSIS — E854 Organ-limited amyloidosis: Secondary | ICD-10-CM | POA: Insufficient documentation

## 2023-09-28 DIAGNOSIS — R0902 Hypoxemia: Secondary | ICD-10-CM | POA: Diagnosis not present

## 2023-09-28 DIAGNOSIS — C9030 Solitary plasmacytoma not having achieved remission: Secondary | ICD-10-CM | POA: Insufficient documentation

## 2023-09-28 DIAGNOSIS — R718 Other abnormality of red blood cells: Secondary | ICD-10-CM | POA: Diagnosis not present

## 2023-09-28 DIAGNOSIS — I2510 Atherosclerotic heart disease of native coronary artery without angina pectoris: Secondary | ICD-10-CM | POA: Insufficient documentation

## 2023-09-28 DIAGNOSIS — G4733 Obstructive sleep apnea (adult) (pediatric): Secondary | ICD-10-CM | POA: Insufficient documentation

## 2023-09-28 DIAGNOSIS — D8989 Other specified disorders involving the immune mechanism, not elsewhere classified: Secondary | ICD-10-CM | POA: Insufficient documentation

## 2023-09-28 DIAGNOSIS — Z951 Presence of aortocoronary bypass graft: Secondary | ICD-10-CM | POA: Insufficient documentation

## 2023-09-28 DIAGNOSIS — E859 Amyloidosis, unspecified: Secondary | ICD-10-CM | POA: Diagnosis not present

## 2023-09-28 LAB — CBC WITH DIFFERENTIAL/PLATELET
Abs Immature Granulocytes: 0.02 10*3/uL (ref 0.00–0.07)
Basophils Absolute: 0 10*3/uL (ref 0.0–0.1)
Basophils Relative: 0 %
Eosinophils Absolute: 0.1 10*3/uL (ref 0.0–0.5)
Eosinophils Relative: 1 %
HCT: 46.9 % (ref 39.0–52.0)
Hemoglobin: 15.7 g/dL (ref 13.0–17.0)
Immature Granulocytes: 0 %
Lymphocytes Relative: 21 %
Lymphs Abs: 1.7 10*3/uL (ref 0.7–4.0)
MCH: 31.3 pg (ref 26.0–34.0)
MCHC: 33.5 g/dL (ref 30.0–36.0)
MCV: 93.4 fL (ref 80.0–100.0)
Monocytes Absolute: 0.8 10*3/uL (ref 0.1–1.0)
Monocytes Relative: 9 %
Neutro Abs: 5.7 10*3/uL (ref 1.7–7.7)
Neutrophils Relative %: 69 %
Platelets: 184 10*3/uL (ref 150–400)
RBC: 5.02 MIL/uL (ref 4.22–5.81)
RDW: 14.1 % (ref 11.5–15.5)
WBC: 8.3 10*3/uL (ref 4.0–10.5)
nRBC: 0 % (ref 0.0–0.2)

## 2023-09-28 MED ORDER — MIDAZOLAM HCL 2 MG/2ML IJ SOLN
INTRAMUSCULAR | Status: AC | PRN
  Administered 2023-09-28: 1 mg via INTRAVENOUS

## 2023-09-28 MED ORDER — FLUMAZENIL 0.5 MG/5ML IV SOLN
INTRAVENOUS | Status: AC
  Filled 2023-09-28: qty 5

## 2023-09-28 MED ORDER — DIPHENHYDRAMINE HCL 50 MG/ML IJ SOLN
INTRAMUSCULAR | Status: AC | PRN
  Administered 2023-09-28: 25 mg via INTRAVENOUS

## 2023-09-28 MED ORDER — FENTANYL CITRATE (PF) 100 MCG/2ML IJ SOLN
INTRAMUSCULAR | Status: AC
  Filled 2023-09-28: qty 2

## 2023-09-28 MED ORDER — MIDAZOLAM HCL 2 MG/2ML IJ SOLN
INTRAMUSCULAR | Status: AC
  Filled 2023-09-28: qty 4

## 2023-09-28 MED ORDER — DIPHENHYDRAMINE HCL 50 MG/ML IJ SOLN
INTRAMUSCULAR | Status: AC
  Filled 2023-09-28: qty 1

## 2023-09-28 MED ORDER — LIDOCAINE HCL 1 % IJ SOLN
INTRAMUSCULAR | Status: AC | PRN
  Administered 2023-09-28: 10 mL via INTRADERMAL

## 2023-09-28 MED ORDER — SODIUM CHLORIDE 0.9 % IV SOLN: INTRAVENOUS | Status: DC

## 2023-09-28 MED ORDER — NALOXONE HCL 0.4 MG/ML IJ SOLN
INTRAMUSCULAR | Status: AC
  Filled 2023-09-28: qty 1

## 2023-09-28 MED ORDER — FENTANYL CITRATE (PF) 100 MCG/2ML IJ SOLN
INTRAMUSCULAR | Status: AC | PRN
  Administered 2023-09-28: 50 ug via INTRAVENOUS

## 2023-09-28 NOTE — Discharge Instructions (Signed)

## 2023-09-28 NOTE — Procedures (Signed)
Interventional Radiology Procedure Note  Procedure: CT guided aspirate and core biopsy of right iliac bone  Complications: None  Recommendations: - Bedrest supine x 1 hrs - Hydrocodone PRN  Pain - Follow biopsy results   Arel Tippen, MD   
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
## 2023-10-01 ENCOUNTER — Other Ambulatory Visit: Payer: Self-pay

## 2023-10-01 ENCOUNTER — Inpatient Hospital Stay (HOSPITAL_COMMUNITY)
Admission: EM | Admit: 2023-10-01 | Discharge: 2023-10-04 | DRG: 193 | Disposition: A | Discharge status: Home / Self Care | Attending: Internal Medicine | Admitting: Internal Medicine

## 2023-10-01 ENCOUNTER — Emergency Department (HOSPITAL_COMMUNITY)

## 2023-10-01 ENCOUNTER — Encounter (HOSPITAL_COMMUNITY): Payer: Self-pay | Admitting: Emergency Medicine

## 2023-10-01 DIAGNOSIS — I110 Hypertensive heart disease with heart failure: Secondary | ICD-10-CM | POA: Diagnosis not present

## 2023-10-01 DIAGNOSIS — Z91014 Allergy to mammalian meats: Secondary | ICD-10-CM

## 2023-10-01 DIAGNOSIS — R0902 Hypoxemia: Principal | ICD-10-CM

## 2023-10-01 DIAGNOSIS — I9589 Other hypotension: Secondary | ICD-10-CM | POA: Diagnosis present

## 2023-10-01 DIAGNOSIS — Z6837 Body mass index (BMI) 37.0-37.9, adult: Secondary | ICD-10-CM

## 2023-10-01 DIAGNOSIS — J9601 Acute respiratory failure with hypoxia: Secondary | ICD-10-CM | POA: Diagnosis present

## 2023-10-01 DIAGNOSIS — I480 Paroxysmal atrial fibrillation: Secondary | ICD-10-CM | POA: Diagnosis present

## 2023-10-01 DIAGNOSIS — Z7902 Long term (current) use of antithrombotics/antiplatelets: Secondary | ICD-10-CM | POA: Diagnosis not present

## 2023-10-01 DIAGNOSIS — Z888 Allergy status to other drugs, medicaments and biological substances status: Secondary | ICD-10-CM

## 2023-10-01 DIAGNOSIS — J123 Human metapneumovirus pneumonia: Principal | ICD-10-CM | POA: Diagnosis present

## 2023-10-01 DIAGNOSIS — J189 Pneumonia, unspecified organism: Secondary | ICD-10-CM | POA: Diagnosis not present

## 2023-10-01 DIAGNOSIS — I2510 Atherosclerotic heart disease of native coronary artery without angina pectoris: Secondary | ICD-10-CM | POA: Diagnosis present

## 2023-10-01 DIAGNOSIS — K760 Fatty (change of) liver, not elsewhere classified: Secondary | ICD-10-CM | POA: Diagnosis present

## 2023-10-01 DIAGNOSIS — E785 Hyperlipidemia, unspecified: Secondary | ICD-10-CM | POA: Diagnosis present

## 2023-10-01 DIAGNOSIS — R918 Other nonspecific abnormal finding of lung field: Secondary | ICD-10-CM | POA: Diagnosis present

## 2023-10-01 DIAGNOSIS — I25119 Atherosclerotic heart disease of native coronary artery with unspecified angina pectoris: Secondary | ICD-10-CM | POA: Diagnosis present

## 2023-10-01 DIAGNOSIS — Z8673 Personal history of transient ischemic attack (TIA), and cerebral infarction without residual deficits: Secondary | ICD-10-CM

## 2023-10-01 DIAGNOSIS — R0602 Shortness of breath: Secondary | ICD-10-CM | POA: Diagnosis not present

## 2023-10-01 DIAGNOSIS — Z87891 Personal history of nicotine dependence: Secondary | ICD-10-CM | POA: Diagnosis not present

## 2023-10-01 DIAGNOSIS — Z951 Presence of aortocoronary bypass graft: Secondary | ICD-10-CM

## 2023-10-01 DIAGNOSIS — E8581 Light chain (AL) amyloidosis: Secondary | ICD-10-CM | POA: Diagnosis present

## 2023-10-01 DIAGNOSIS — R9431 Abnormal electrocardiogram [ECG] [EKG]: Secondary | ICD-10-CM | POA: Diagnosis present

## 2023-10-01 DIAGNOSIS — K5903 Drug induced constipation: Secondary | ICD-10-CM

## 2023-10-01 DIAGNOSIS — Z880 Allergy status to penicillin: Secondary | ICD-10-CM | POA: Diagnosis not present

## 2023-10-01 DIAGNOSIS — Z833 Family history of diabetes mellitus: Secondary | ICD-10-CM

## 2023-10-01 DIAGNOSIS — Z8249 Family history of ischemic heart disease and other diseases of the circulatory system: Secondary | ICD-10-CM | POA: Diagnosis not present

## 2023-10-01 DIAGNOSIS — I509 Heart failure, unspecified: Secondary | ICD-10-CM | POA: Diagnosis not present

## 2023-10-01 DIAGNOSIS — Z7982 Long term (current) use of aspirin: Secondary | ICD-10-CM

## 2023-10-01 DIAGNOSIS — Z1152 Encounter for screening for COVID-19: Secondary | ICD-10-CM

## 2023-10-01 DIAGNOSIS — I43 Cardiomyopathy in diseases classified elsewhere: Secondary | ICD-10-CM | POA: Diagnosis not present

## 2023-10-01 DIAGNOSIS — Z79899 Other long term (current) drug therapy: Secondary | ICD-10-CM

## 2023-10-01 DIAGNOSIS — A419 Sepsis, unspecified organism: Secondary | ICD-10-CM | POA: Diagnosis not present

## 2023-10-01 DIAGNOSIS — G4700 Insomnia, unspecified: Secondary | ICD-10-CM | POA: Diagnosis present

## 2023-10-01 DIAGNOSIS — G4733 Obstructive sleep apnea (adult) (pediatric): Secondary | ICD-10-CM | POA: Diagnosis present

## 2023-10-01 DIAGNOSIS — E854 Organ-limited amyloidosis: Secondary | ICD-10-CM | POA: Diagnosis present

## 2023-10-01 DIAGNOSIS — I959 Hypotension, unspecified: Secondary | ICD-10-CM | POA: Diagnosis not present

## 2023-10-01 DIAGNOSIS — K5900 Constipation, unspecified: Secondary | ICD-10-CM | POA: Diagnosis present

## 2023-10-01 DIAGNOSIS — N400 Enlarged prostate without lower urinary tract symptoms: Secondary | ICD-10-CM | POA: Diagnosis present

## 2023-10-01 DIAGNOSIS — G8929 Other chronic pain: Secondary | ICD-10-CM | POA: Diagnosis present

## 2023-10-01 DIAGNOSIS — Z841 Family history of disorders of kidney and ureter: Secondary | ICD-10-CM

## 2023-10-01 DIAGNOSIS — Z91018 Allergy to other foods: Secondary | ICD-10-CM | POA: Diagnosis present

## 2023-10-01 DIAGNOSIS — R911 Solitary pulmonary nodule: Secondary | ICD-10-CM | POA: Diagnosis not present

## 2023-10-01 DIAGNOSIS — D849 Immunodeficiency, unspecified: Secondary | ICD-10-CM | POA: Diagnosis present

## 2023-10-01 DIAGNOSIS — Z955 Presence of coronary angioplasty implant and graft: Secondary | ICD-10-CM

## 2023-10-01 DIAGNOSIS — E039 Hypothyroidism, unspecified: Secondary | ICD-10-CM | POA: Diagnosis not present

## 2023-10-01 DIAGNOSIS — I421 Obstructive hypertrophic cardiomyopathy: Secondary | ICD-10-CM | POA: Diagnosis present

## 2023-10-01 DIAGNOSIS — E66812 Obesity, class 2: Secondary | ICD-10-CM | POA: Diagnosis present

## 2023-10-01 DIAGNOSIS — Z8349 Family history of other endocrine, nutritional and metabolic diseases: Secondary | ICD-10-CM

## 2023-10-01 DIAGNOSIS — R0989 Other specified symptoms and signs involving the circulatory and respiratory systems: Secondary | ICD-10-CM | POA: Diagnosis not present

## 2023-10-01 DIAGNOSIS — R051 Acute cough: Secondary | ICD-10-CM | POA: Diagnosis not present

## 2023-10-01 DIAGNOSIS — Z79890 Hormone replacement therapy (postmenopausal): Secondary | ICD-10-CM

## 2023-10-01 DIAGNOSIS — I447 Left bundle-branch block, unspecified: Secondary | ICD-10-CM | POA: Diagnosis present

## 2023-10-01 DIAGNOSIS — Z96642 Presence of left artificial hip joint: Secondary | ICD-10-CM | POA: Diagnosis present

## 2023-10-01 DIAGNOSIS — R7989 Other specified abnormal findings of blood chemistry: Secondary | ICD-10-CM | POA: Diagnosis not present

## 2023-10-01 DIAGNOSIS — I517 Cardiomegaly: Secondary | ICD-10-CM | POA: Diagnosis not present

## 2023-10-01 DIAGNOSIS — Z885 Allergy status to narcotic agent status: Secondary | ICD-10-CM

## 2023-10-01 DIAGNOSIS — Z887 Allergy status to serum and vaccine status: Secondary | ICD-10-CM

## 2023-10-01 DIAGNOSIS — Z8051 Family history of malignant neoplasm of kidney: Secondary | ICD-10-CM

## 2023-10-01 HISTORY — DX: Pneumonia, unspecified organism: J18.9

## 2023-10-01 LAB — COMPREHENSIVE METABOLIC PANEL WITH GFR
ALT: 85 U/L — ABNORMAL HIGH (ref 0–44)
AST: 97 U/L — ABNORMAL HIGH (ref 15–41)
Albumin: 1.6 g/dL — ABNORMAL LOW (ref 3.5–5.0)
Alkaline Phosphatase: 80 U/L (ref 38–126)
Anion gap: 7 (ref 5–15)
BUN: 16 mg/dL (ref 6–20)
CO2: 28 mmol/L (ref 22–32)
Calcium: 7.8 mg/dL — ABNORMAL LOW (ref 8.9–10.3)
Chloride: 101 mmol/L (ref 98–111)
Creatinine, Ser: 0.69 mg/dL (ref 0.61–1.24)
GFR, Estimated: >60 mL/min (ref >60)
Glucose, Bld: 114 mg/dL — ABNORMAL HIGH (ref 70–99)
Potassium: 3.9 mmol/L (ref 3.5–5.1)
Sodium: 136 mmol/L (ref 135–145)
Total Bilirubin: 0.8 mg/dL (ref 0.0–1.2)
Total Protein: 5.1 g/dL — ABNORMAL LOW (ref 6.5–8.1)

## 2023-10-01 LAB — URINALYSIS, COMPLETE (UACMP) WITH MICROSCOPIC
Bacteria, UA: NONE SEEN
Bilirubin Urine: NEGATIVE
Glucose, UA: NEGATIVE mg/dL
Hgb urine dipstick: NEGATIVE
Ketones, ur: NEGATIVE mg/dL
Leukocytes,Ua: NEGATIVE
Nitrite: NEGATIVE
Protein, ur: >=300 mg/dL — AB
Specific Gravity, Urine: >1.046 — ABNORMAL HIGH (ref 1.005–1.030)
pH: 5 (ref 5.0–8.0)

## 2023-10-01 LAB — BRAIN NATRIURETIC PEPTIDE: B Natriuretic Peptide: 374.3 pg/mL — ABNORMAL HIGH (ref 0.0–100.0)

## 2023-10-01 LAB — D-DIMER, QUANTITATIVE: D-Dimer, Quant: 1.42 ug{FEU}/mL — ABNORMAL HIGH (ref 0.00–0.50)

## 2023-10-01 LAB — RESP PANEL BY RT-PCR (RSV, FLU A&B, COVID)  RVPGX2
Influenza A by PCR: NEGATIVE
Influenza B by PCR: NEGATIVE
Resp Syncytial Virus by PCR: NEGATIVE
SARS Coronavirus 2 by RT PCR: NEGATIVE

## 2023-10-01 LAB — CBC WITH DIFFERENTIAL/PLATELET
Abs Immature Granulocytes: 0.12 10*3/uL — ABNORMAL HIGH (ref 0.00–0.07)
Basophils Absolute: 0 10*3/uL (ref 0.0–0.1)
Basophils Relative: 0 %
Eosinophils Absolute: 0 10*3/uL (ref 0.0–0.5)
Eosinophils Relative: 0 %
HCT: 49.8 % (ref 39.0–52.0)
Hemoglobin: 16.4 g/dL (ref 13.0–17.0)
Immature Granulocytes: 1 %
Lymphocytes Relative: 7 %
Lymphs Abs: 1.2 10*3/uL (ref 0.7–4.0)
MCH: 31 pg (ref 26.0–34.0)
MCHC: 32.9 g/dL (ref 30.0–36.0)
MCV: 94.1 fL (ref 80.0–100.0)
Monocytes Absolute: 0.9 10*3/uL (ref 0.1–1.0)
Monocytes Relative: 5 %
Neutro Abs: 15.9 10*3/uL — ABNORMAL HIGH (ref 1.7–7.7)
Neutrophils Relative %: 87 %
Platelets: 184 10*3/uL (ref 150–400)
RBC: 5.29 MIL/uL (ref 4.22–5.81)
RDW: 14.5 % (ref 11.5–15.5)
WBC: 18.1 10*3/uL — ABNORMAL HIGH (ref 4.0–10.5)
nRBC: 0 % (ref 0.0–0.2)

## 2023-10-01 LAB — MAGNESIUM: Magnesium: 2.3 mg/dL (ref 1.7–2.4)

## 2023-10-01 LAB — PROTIME-INR
INR: 1.1 (ref 0.8–1.2)
Prothrombin Time: 13.9 s (ref 11.4–15.2)

## 2023-10-01 LAB — PROCALCITONIN: Procalcitonin: <0.1 ng/mL

## 2023-10-01 LAB — PHOSPHORUS: Phosphorus: 4.7 mg/dL — ABNORMAL HIGH (ref 2.5–4.6)

## 2023-10-01 LAB — I-STAT CG4 LACTIC ACID, ED
Lactic Acid, Venous: 1.6 mmol/L (ref 0.5–1.9)
Lactic Acid, Venous: 1.1 mmol/L (ref 0.5–1.9)

## 2023-10-01 LAB — CK: Total CK: 601 U/L — ABNORMAL HIGH (ref 49–397)

## 2023-10-01 LAB — TSH: TSH: 2.835 u[IU]/mL (ref 0.350–4.500)

## 2023-10-01 MED ORDER — ONDANSETRON HCL 4 MG/2ML IJ SOLN
4.0000 mg | Freq: Once | INTRAMUSCULAR | Status: AC
  Administered 2023-10-01: 4 mg via INTRAVENOUS
  Filled 2023-10-01: qty 2

## 2023-10-01 MED ORDER — SODIUM CHLORIDE 0.9 % IV SOLN: INTRAVENOUS | Status: AC

## 2023-10-01 MED ORDER — ALBUTEROL SULFATE (2.5 MG/3ML) 0.083% IN NEBU: 2.5000 mg | INHALATION_SOLUTION | RESPIRATORY_TRACT | Status: DC | PRN

## 2023-10-01 MED ORDER — SODIUM CHLORIDE (PF) 0.9 % IJ SOLN
INTRAMUSCULAR | Status: AC
  Filled 2023-10-01: qty 50

## 2023-10-01 MED ORDER — ROSUVASTATIN CALCIUM 20 MG PO TABS
40.0000 mg | ORAL_TABLET | Freq: Every day | ORAL | Status: DC
Start: 2023-10-02 — End: 2023-10-04
  Administered 2023-10-02 – 2023-10-04 (×3): 40 mg via ORAL
  Filled 2023-10-01 (×3): qty 2

## 2023-10-01 MED ORDER — OXYCODONE HCL 5 MG PO TABS
20.0000 mg | ORAL_TABLET | Freq: Every day | ORAL | Status: DC | PRN
  Administered 2023-10-02 – 2023-10-04 (×12): 20 mg via ORAL
  Filled 2023-10-01 (×12): qty 4

## 2023-10-01 MED ORDER — BUSPIRONE HCL 5 MG PO TABS
15.0000 mg | ORAL_TABLET | Freq: Two times a day (BID) | ORAL | Status: DC
  Administered 2023-10-01 – 2023-10-04 (×6): 15 mg via ORAL
  Filled 2023-10-01 (×2): qty 3
  Filled 2023-10-01: qty 2
  Filled 2023-10-01 (×3): qty 3

## 2023-10-01 MED ORDER — LEVOFLOXACIN IN D5W 750 MG/150ML IV SOLN
750.0000 mg | Freq: Once | INTRAVENOUS | Status: AC
  Administered 2023-10-01: 750 mg via INTRAVENOUS
  Filled 2023-10-01: qty 150

## 2023-10-01 MED ORDER — CLOPIDOGREL BISULFATE 75 MG PO TABS
75.0000 mg | ORAL_TABLET | Freq: Every day | ORAL | Status: DC
Start: 2023-10-02 — End: 2023-10-04
  Administered 2023-10-02 – 2023-10-04 (×3): 75 mg via ORAL
  Filled 2023-10-01 (×3): qty 1

## 2023-10-01 MED ORDER — HYDROCODONE-ACETAMINOPHEN 5-325 MG PO TABS: 1.0000 | ORAL_TABLET | ORAL | Status: DC | PRN

## 2023-10-01 MED ORDER — LEVOFLOXACIN IN D5W 750 MG/150ML IV SOLN: 750.0000 mg | Freq: Once | INTRAVENOUS | Status: DC

## 2023-10-01 MED ORDER — LEVOTHYROXINE SODIUM 100 MCG PO TABS
200.0000 ug | ORAL_TABLET | Freq: Every day | ORAL | Status: DC
  Administered 2023-10-02 – 2023-10-04 (×3): 200 ug via ORAL
  Filled 2023-10-01 (×3): qty 2

## 2023-10-01 MED ORDER — ACETAMINOPHEN 325 MG PO TABS: 650.0000 mg | ORAL_TABLET | Freq: Four times a day (QID) | ORAL | Status: DC | PRN

## 2023-10-01 MED ORDER — IOHEXOL 350 MG/ML SOLN
100.0000 mL | Freq: Once | INTRAVENOUS | Status: AC | PRN
  Administered 2023-10-01: 100 mL via INTRAVENOUS

## 2023-10-01 MED ORDER — GUAIFENESIN ER 600 MG PO TB12
600.0000 mg | ORAL_TABLET | Freq: Two times a day (BID) | ORAL | Status: DC
  Administered 2023-10-01 – 2023-10-04 (×6): 600 mg via ORAL
  Filled 2023-10-01 (×6): qty 1

## 2023-10-01 MED ORDER — MIDODRINE HCL 5 MG PO TABS
10.0000 mg | ORAL_TABLET | Freq: Three times a day (TID) | ORAL | Status: DC
  Administered 2023-10-02 – 2023-10-04 (×7): 10 mg via ORAL
  Filled 2023-10-01 (×8): qty 2

## 2023-10-01 MED ORDER — MIDODRINE HCL 5 MG PO TABS: 10.0000 mg | ORAL_TABLET | Freq: Three times a day (TID) | ORAL | Status: DC

## 2023-10-01 MED ORDER — SODIUM CHLORIDE 0.9 % IV SOLN
100.0000 mg | Freq: Two times a day (BID) | INTRAVENOUS | Status: DC
  Administered 2023-10-01 – 2023-10-02 (×3): 100 mg via INTRAVENOUS
  Filled 2023-10-01 (×5): qty 100

## 2023-10-01 MED ORDER — ASPIRIN 81 MG PO TBEC
81.0000 mg | DELAYED_RELEASE_TABLET | Freq: Every day | ORAL | Status: DC
Start: 2023-10-02 — End: 2023-10-04
  Administered 2023-10-02 – 2023-10-04 (×3): 81 mg via ORAL
  Filled 2023-10-01 (×3): qty 1

## 2023-10-01 MED ORDER — SODIUM CHLORIDE 0.9 % IV SOLN: 500.0000 mg | INTRAVENOUS | Status: DC

## 2023-10-01 MED ORDER — EZETIMIBE 10 MG PO TABS
10.0000 mg | ORAL_TABLET | Freq: Every day | ORAL | Status: DC
  Administered 2023-10-02 – 2023-10-04 (×3): 10 mg via ORAL
  Filled 2023-10-01 (×4): qty 1

## 2023-10-01 MED ORDER — POLYETHYLENE GLYCOL 3350 17 G PO PACK
17.0000 g | PACK | Freq: Every day | ORAL | Status: DC | PRN
  Administered 2023-10-03 – 2023-10-04 (×2): 17 g via ORAL
  Filled 2023-10-01 (×2): qty 1

## 2023-10-01 MED ORDER — SODIUM CHLORIDE 0.9 % IV SOLN
2.0000 g | INTRAVENOUS | Status: DC
  Administered 2023-10-02 – 2023-10-03 (×2): 2 g via INTRAVENOUS
  Filled 2023-10-01 (×2): qty 20

## 2023-10-01 MED ORDER — ACETAMINOPHEN 650 MG RE SUPP: 650.0000 mg | Freq: Four times a day (QID) | RECTAL | Status: DC | PRN

## 2023-10-01 NOTE — Assessment & Plan Note (Signed)
 Status post surgical resection at [facility].  Stable chronic monitoring

## 2023-10-01 NOTE — Assessment & Plan Note (Signed)
 Continue CPAP.

## 2023-10-01 NOTE — Assessment & Plan Note (Signed)
 Follow up as an out pt will need repeat imaging to document resolution

## 2023-10-01 NOTE — ED Provider Notes (Signed)
 Patient care was taken over from Dr. Nora Beal.  He presents with hypoxia and shortness of breath.  He is on oxygen at 4 L/min although he is alternating between that and his CPAP currently.  Chest x-ray showed some probable infiltrate in the lower lobes.  He is afebrile.  His BNP is mildly elevated but he does not have other suggestions of pulmonary edema.  D-dimer was elevated.  CTA of his chest does not show any evidence of PE.  There is evidence of lingular pneumonia.  He was started on IV antibiotics.  He reports an anaphylaxis to penicillin although there was a note that he has previously taken cephalosporins.  However he also has a prolonged QT interval which was showing contraindication to Zithromax.  Given this, patient was given IV Levaquin.  There was some pulmonary nodules on his CT which will need outpatient follow-up.  This was discussed with the patient.  Discussed with Dr. Hendrick Locke who will admit the pt.   Joseph Los, MD 10/01/23 2053

## 2023-10-01 NOTE — Assessment & Plan Note (Signed)
 Continue midodrine

## 2023-10-01 NOTE — ED Provider Notes (Signed)
 Joseph [name] Provider Note   CSN: [id number] Arrival date & time: 10/01/23  1320     History  Chief Complaint  Patient presents with   hypoxia   Shortness of Breath    Joseph [name]. is a 58 y.o. male.  Patient is a 58 year old male with a past medical history of amyloid cardiomyopathy status post myomectomy in November 2024, CAD, TIA, hypotension on midodrine presenting to the emergency department with shortness of breath.  Patient did have a bone marrow biopsy last week for his amyloid workup and states that about 2 days later started to develop a cough with shortness of breath.  He denies any associated chest pain or fevers.  States that his cough is nonproductive.  States that he has had some bodyaches.  He states that he has had some swelling in his feet but is not worse from his baseline.  He states that he went to urgent care this morning and was found to be hypoxic to the 80s and hypotensive and came to the emergency department.  States that he did take his midodrine this morning.  The history is provided by the patient and the spouse.  Shortness of Breath      Home Medications Prior to Admission medications   Medication Sig Start Date End Date Taking? Authorizing Provider  acetaminophen (TYLENOL) 650 MG CR tablet Take 650 mg by mouth at bedtime.    [provider]  alfuzosin (UROXATRAL) 10 MG 24 hr tablet Take 1 tablet (10 mg total) by mouth daily. 09/20/23   Stoioff, Scott C, MD  Ascorbic Acid (VITAMIN C PO) Take 1 tablet by mouth daily.    [provider]  aspirin EC 81 MG tablet Take 1 tablet (81 mg total) by mouth daily. Swallow whole. 08/24/23   Williams, Evan, PA-C  busPIRone (BUSPAR) 15 MG tablet Take 15 mg by mouth 2 (two) times daily.    [provider]  Cholecalciferol (VITAMIN D-3 PO) Take 1 capsule by mouth at bedtime.    [provider]  clopidogrel (PLAVIX) 75 MG tablet Take 75 mg  by mouth daily.    [provider]  diphenhydramine-acetaminophen (TYLENOL PM) 25-500 MG TABS tablet Take 1 tablet by mouth at bedtime.    [provider]  doxylamine, Sleep, (UNISOM) 25 MG tablet Take 25 mg by mouth at bedtime.    [provider]  EPINEPHrine (EPIPEN 2-PAK) 0.3 mg/0.3 mL IJ SOAJ injection Inject 0.3 mLs (0.3 mg total) into the muscle as needed for anaphylaxis. 12/16/19   Trudy Fusi, DO  ezetimibe (ZETIA) 10 MG tablet Take 1 tablet (10 mg total) by mouth daily. 07/07/23   Von Grumbling, PA-C  levothyroxine (SYNTHROID) 200 MCG tablet Take 1 tablet (200 mcg total) by mouth daily before breakfast. 06/12/23   Wendel Hals, NP  midodrine (PROAMATINE) 10 MG tablet Take 10 mg by mouth 3 (three) times daily.    [provider]  Multiple Vitamins-Minerals (MULTIVITAMIN MEN 50+) TABS Take 1 tablet by mouth daily.    [provider]  nitroGLYCERIN (NITROSTAT) 0.4 MG SL tablet Place 0.4 mg under the tongue every 5 (five) minutes as needed for chest pain.    [provider]  Oxycodone HCl 20 MG TABS Take 1 tablet (20 mg total) by mouth as directed. Patient taking differently: Take 20 mg by mouth 5 (five) times daily as needed (pain). 01/05/22   Flynn Hylan, MD  Pyridoxine HCl (VITAMIN B-6 PO) Take 1 tablet by mouth daily.    [provider]  rosuvastatin (CRESTOR) 40 MG tablet Take 1 tablet (40 mg total) by mouth daily. 07/11/23   Conte, Tessa N, PA-C      Allergies    Alpha-gal, Penicillins, Zestril [lisinopril], Gadavist [gadobutrol], Beef-derived drug products, Pork-derived products, Ms contin [morphine], and Tetanus toxoid    Review of Systems   Review of Systems  Respiratory:  Positive for shortness of breath.     Physical Exam Updated Vital Signs BP 94/64   Pulse 74   Temp 97.8 F (36.6 C) (Oral)   Resp 16   Ht 6' (1.829 m)   Wt 126 kg   SpO2 93%   BMI 37.67 kg/m  Physical Exam Vitals and nursing  note reviewed.  Constitutional:      General: He is not in acute distress.    Appearance: He is well-developed. He is ill-appearing.  HENT:     Head: Normocephalic and atraumatic.     Mouth/Throat:     Mouth: Mucous membranes are moist.  Eyes:     Extraocular Movements: Extraocular movements intact.  Cardiovascular:     Rate and Rhythm: Normal rate and regular rhythm.     Heart sounds: Normal heart sounds.  Pulmonary:     Effort: Pulmonary effort is normal.     Breath sounds: Examination of the right-lower field reveals rales. Examination of the left-lower field reveals rales. Rales present.  Abdominal:     Palpations: Abdomen is soft.     Tenderness: There is no abdominal tenderness.  Musculoskeletal:        General: Normal range of motion.     Cervical back: Normal range of motion and neck supple.     Right lower leg: Edema (Trace nonpitting in feet) present.     Left lower leg: Edema (Trace nonpitting in feet) present.  Skin:    General: Skin is warm and dry.  Neurological:     General: No focal deficit present.     Mental Status: He is alert and oriented to person, place, and time.  Psychiatric:        Mood and Affect: Mood normal.        Behavior: Behavior normal.     ED Results / Procedures / Treatments   Labs (all labs ordered are listed, but only abnormal results are displayed) Labs Reviewed  COMPREHENSIVE METABOLIC PANEL WITH GFR - Abnormal; Notable for the following components:      Result Value   Glucose, Bld 114 (*)    Calcium 7.8 (*)    Total Protein 5.1 (*)    Albumin 1.6 (*)    AST 97 (*)    ALT 85 (*)    All other components within normal limits  CBC WITH DIFFERENTIAL/PLATELET - Abnormal; Notable for the following components:   WBC 18.1 (*)    Neutro Abs 15.9 (*)    Abs Immature Granulocytes 0.12 (*)    All other components within normal limits  CULTURE, BLOOD (ROUTINE X 2)  CULTURE, BLOOD (ROUTINE X 2)  RESP PANEL BY RT-PCR (RSV, FLU A&B, COVID)   RVPGX2  PROTIME-INR  BRAIN NATRIURETIC PEPTIDE  I-STAT CG4 LACTIC ACID, ED    EKG EKG Interpretation Date/Time:  Sunday October 01 2023 14:50:17 EDT Ventricular Rate:  67 PR Interval:  194 QRS Duration:  172 QT Interval:  474 QTC Calculation: 501 R Axis:   63  Text Interpretation: Sinus  rhythm Left bundle branch block No significant change since last tracing Confirmed by Celesta Coke ([id number]) on 10/01/2023 2:59:28 PM  Radiology No results found.  Procedures .Critical Care  Performed by: Kingsley, Fayetta Sorenson K, DO Authorized by: Nolberto Batty, DO   Critical care provider statement:    Critical care time (minutes):  30   Critical care was necessary to treat or prevent imminent or life-threatening deterioration of the following conditions:  Respiratory failure   Critical care was time spent personally by me on the following activities:  Development of treatment plan with patient or surrogate, discussions with consultants, evaluation of patient's response to treatment, examination of patient, ordering and review of laboratory studies, ordering and review of radiographic studies, ordering and performing treatments and interventions, pulse oximetry, re-evaluation of patient's condition and review of old charts     Medications Ordered in ED Medications - No data to display  ED Course/ Medical Decision Making/ A&P Clinical Course as of 10/01/23 1534  Sun Oct 01, 2023  1533 Patient signed out to Dr. Nolia Baumgartner pending remainder of labs and CXR. Will need admission for hypoxia. [VK]    Clinical Course User Index [VK] Kingsley, Lorance Pickeral K, DO                                 Medical Decision Making This patient presents to the ED with chief complaint(s) of shortness of breath, cough with pertinent past medical history of amyloid cardiomyopathy status post myomectomy, CAD, TIA, hypotension on midodrine which further complicates the presenting complaint. The complaint involves an  extensive differential diagnosis and also carries with it a high risk of complications and morbidity.    The differential diagnosis includes ACS, arrhythmia, anemia, pneumonia, pneumothorax, pulmonary edema, pleural effusion, sepsis  Additional history obtained: Additional history obtained from spouse Records reviewed outpatient cardiology records  ED Course and Reassessment: On patient's arrival he is hypoxic to 87% on room air and was placed on 4 L nasal cannula, otherwise is hemodynamically stable, ill-appearing but in no acute distress.  The patient will have EKG, labs and chest x-ray as well as viral swab performed.  Will have blood cultures and sepsis workup with the setting of his hypotension and hypoxia for possible sepsis versus CHF causing his symptoms.  Independent labs interpretation:  The following labs were independently interpreted: leukocytosis, mild transaminitis  Independent visualization of imaging: - pending     Amount and/or Complexity of Data Reviewed Labs: ordered. Radiology: ordered.          Final Clinical Impression(s) / ED Diagnoses Final diagnoses:  Hypoxia    Rx / DC Orders ED Discharge Orders     None         Kingsley, Ward Boissonneault K, DO 10/01/23 1534

## 2023-10-01 NOTE — Assessment & Plan Note (Signed)
- -  Patient presenting with  productive cough,  hypoxia  , and infiltrate in  lower lobe on chest x-ray -Infiltrate on CXR and 2-3 characteristics (fever, leukocytosis, purulent sputum) are consistent with pneumonia. -This appears to be most likely community-acquired pneumonia.    will admit for treatment of CAP will start on appropriate antibiotic coverage. - Rocephin/doxycycline   Obtain:  sputum cultures,                influenza serologies negative                  COVID PCR negative                    blood cultures and sputum cultures ordered                   strep pneumo UA antigen,                    check for Legionella antigen.                Provide oxygen as needed.

## 2023-10-01 NOTE — Assessment & Plan Note (Signed)
-   Check TSH continue home medications Synthroid at po q day

## 2023-10-01 NOTE — Assessment & Plan Note (Signed)
 Avoid beef

## 2023-10-01 NOTE — Assessment & Plan Note (Signed)
 Chronic stable continue aspirin 81 mg daily Zetia 10 mg a day Crestor 40 mg a day and Plavix 75 mg a day

## 2023-10-01 NOTE — ED Triage Notes (Addendum)
 Patient seen at urgent care in [place] this am. Patient's oxygen level was 84 on room air and hypotension of 88/52. He has had midodrine since then.  Recent bone marrow biopsy on Thursday. Hx of Amlode protein  Shortness of breath started in the last 24 hours

## 2023-10-01 NOTE — Subjective & Objective (Signed)
 Hx of amiloidisis with cardiomyopathy Came in hypoxic in mid 86% now on 4L required intermittent CPAP CXR showed possible PNA D.dimer was positive no PE on CTA  But confirmed PNA Got Levaquin

## 2023-10-01 NOTE — Assessment & Plan Note (Signed)
 Continue Plavix 75 mg a day continue Crestor

## 2023-10-01 NOTE — Assessment & Plan Note (Signed)
 Continue home dose of MiraLAX

## 2023-10-01 NOTE — Assessment & Plan Note (Signed)
 Chronic stable followed by cardiology and hematology Emailed Dr.  Salomon Cree that patient has been admitted

## 2023-10-01 NOTE — H&P (Signed)
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 17 g by mouth daily as needed for moderate constipation.   Yes [provider]  rosuvastatin (CRESTOR) 40 MG tablet Take 1 tablet (40 mg total) by mouth daily. 07/11/23  Yes [name], Joseph N, PA-C  Pyridoxine HCl (VITAMIN B-6 PO) Take 1 tablet by mouth daily. Patient not taking: Reported on 10/01/2023    [provider]    ___________________________________________________________________________________________________ Physical Exam:    10/01/2023    7:23 PM 10/01/2023    7:15 PM 10/01/2023    6:41 PM  Vitals with  BMI  Systolic 107    Diastolic 62    Pulse 65 63 66     1. General:  in No  Acute distress     Chronically ill   -appearing 2. Psychological: Alert and   Oriented 3. Head/ENT:    Dry Mucous Membranes                          Head Non traumatic, neck supple                        Poor Dentition 4. SKIN:  decreased Skin turgor,  Skin clean Dry and intact no rash    5. Heart: Regular rate and rhythm no  Murmur, no Rub or gallop 6. Lungs: , no wheezes  some crackles   7. Abdomen: Soft,  non-tender, distended   obese  bowel sounds present 8. Lower extremities: no clubbing, cyanosis trace chronic right > leftedema 9. Neurologically Grossly intact, moving all 4 extremities equally   10. MSK: Normal range of motion    Chart has been reviewed  ______________________________________________________________________________________________  Assessment/Plan  58 y.o. male with medical history significant of Alpha gal, hypertrophic cardiomyopathy, OSA on CPAP, TIA,   HOCM s/p septal reduction therapy, cardiac amyloidosis (non-AL, non-ATTR), atrial fibrillation, hypotension, chronic pain, CAD, kidney stones, hepatic Statosis    Admitted for   Hypoxia  Community acquired pneumonia,   Pulmonary nodule     Present on Admission:  CAP (community acquired pneumonia)  Prolonged QT interval  Allergy to alpha-gal  Cardiac amyloidosis (HCC)  Acute respiratory failure with hypoxia (HCC)  Hypothyroidism  Coronary artery disease involving native coronary artery of native heart with angina pectoris (HCC)  Constipation  HOCM (hypertrophic obstructive cardiomyopathy) (HCC)  Hypotension  Pulmonary nodules  OSA (obstructive sleep apnea)     History of TIA (transient ischemic attack) Continue Plavix 75 mg a day continue Crestor  Allergy to alpha-gal Avoid beef  CAP (community acquired pneumonia)  - -Patient presenting with  productive cough,  hypoxia  , and infiltrate in  lower lobe on chest  x-ray -Infiltrate on CXR and 2-3 characteristics (fever, leukocytosis, purulent sputum) are consistent with pneumonia. -This appears to be most likely community-acquired pneumonia.    will admit for treatment of CAP will start on appropriate antibiotic coverage. - Rocephin/doxycycline   Obtain:  sputum cultures,                influenza serologies negative                  COVID PCR negative  blood cultures and sputum cultures ordered                   strep pneumo UA antigen,                    check for Legionella antigen.                Provide oxygen as needed.    Cardiac amyloidosis (HCC) Chronic stable followed by cardiology and hematology Emailed Dr.  Salomon Cree that patient has been admitted  Acute respiratory failure with hypoxia [facility])  this patient has acute respiratory failure with Hypoxia   as documented by the presence of following: O2 saturatio< 90% on RA   Likely due to:   Pneumonia,  Provide O2 therapy and titrate as needed  Continuous pulse ox   check Pulse ox with ambulation prior to discharge    flutter valve ordered   Hypothyroidism - Check TSH continue home medications Synthroid at 200mcg po q day   Coronary artery disease involving native coronary artery of native heart with angina pectoris (HCC) Chronic stable continue aspirin 81 mg daily Zetia 10 mg a day Crestor 40 mg a day and Plavix 75 mg a day  Constipation Continue home dose of MiraLAX  HOCM (hypertrophic obstructive cardiomyopathy) (HCC) Status post surgical resection at [facility].  Stable chronic monitoring  Hypotension Continue midodrine  Pulmonary nodules Follow up as an out pt will need repeat imaging to document resolution   OSA (obstructive sleep apnea) Continue CPAP   Other plan as per orders.  DVT prophylaxis:  SCD     Code Status:    Code Status: Prior FULL CODE     per patient   I had personally discussed CODE STATUS with patient   ACP   none    Family  Communication:   Family  at  Bedside  plan of care was discussed  with Wife,   Diet  Diet Orders (From admission, onward)     Start     Ordered   10/01/23 2250  Diet Heart Room service appropriate? Yes; Fluid consistency: Thin  Diet effective now       Question Answer Comment  Room service appropriate? Yes   Fluid consistency: Thin      10/01/23 2249            Disposition Plan:       To home once workup is complete and patient is stable   Following barriers for discharge:                                                       Electrolytes corrected                              white count improving able to transition to PO antibiotics                                                       Will likely need home health, home O2, set up        Consult Orders  (From  admission, onward)           Start     Ordered   10/01/23 2023  Consult to hospitalist  Once       Provider:  (Not yet assigned)  Question Answer Comment  Place call to: [facility]   Reason for Consult Admit      10/01/23 2022            Consults called: emailed Oncology   Admission status:  ED Disposition     ED Disposition  Admit   Condition  --   Comment  Hospital Area: [facility] [[id number]]  Level of Care: Progressive [102]  Admit to Progressive based on following criteria: RESPIRATORY PROBLEMS hypoxemic/hypercapnic respiratory failure that is responsive to NIPPV (BiPAP) or High Flow Nasal Cannula (6-80 lpm). Frequent assessment/intervention, no > Q2 hrs < Q4 hrs, to maintain oxygenation and pulmonary hygiene.  May admit patient to [facility] or [facility] if equivalent level of care is available:: No  Covid Evaluation: Asymptomatic - no recent exposure (last 10 days) testing not required  Diagnosis: CAP (community acquired pneumonia) [[id number]]  Admitting Physician: Pershing Skidmore [[id number]]  Attending Physician: Jordin Dambrosio [[id number]]  Certification:: I  certify this patient will need inpatient services for at least 2 midnights  Expected Medical Readiness: 10/04/2023            inpatient     I Expect 2 midnight stay secondary to severity of patient's current illness need for inpatient interventions justified by the following:  hemodynamic instability despite optimal treatment ( hypoxia,  )   Severe lab/radiological/exam abnormalities including:    CAP and extensive comorbidities including:  Chronic pain    CHF   CAD  liver disease    That are currently affecting medical management.   I expect  patient to be hospitalized for 2 midnights requiring inpatient medical care.  Patient is at high risk for adverse outcome (such as loss of life or disability) if not treated.  Indication for inpatient stay as follows:     New or worsening hypoxia   Need for IV antibiotics, IV fluids     Level of care   progressive       tele indefinitely please discontinue once patient no longer qualifies COVID-19 Labs   Lab Results  Component Value Date   SARSCOV2NAA NEGATIVE 10/01/2023     Brantlee Hinde 10/01/2023, 11:03 PM    [facility]     after 2 AM please page floor coverage PA If 7AM-7PM, please contact the day team taking care of the patient using [email or web address]

## 2023-10-01 NOTE — Assessment & Plan Note (Signed)
 this patient has acute respiratory failure with Hypoxia  as documented by the presence of following: O2 saturatio< 90% on RA   Likely due to:   Pneumonia,   Provide O2 therapy and titrate as needed  Continuous pulse ox   check Pulse ox with ambulation prior to discharge    flutter valve ordered

## 2023-10-01 NOTE — Progress Notes (Signed)
 Pharmacy Antibiotic Note  Joseph C Venturi Jr. is a 58 y.o. male admitted on 10/01/2023 with pneumonia.  Pharmacy has been consulted for Levaquin dosing.  Patient with documented Penicillin allergy.  Pharmacy asked to investigate beta-lactam allergy and if there is documented history of use of cephalosporins, can adjust levofloxacin to ceftriaxone & azithromycin.  Patient received Levaquin 750mg  IV x 1 in ED 4/13 @ 1717  Per EPIC history, patient has tolerated cephalosporin use in the past.  Plan: D/C Levaquin Ceftriaxone 2g IV q24h Azithromycin 500mg  IV q24h   Thank you for allowing pharmacy to be a part of this patient's care.  Rulon Councilman, PharmD 10/01/2023 10:17 PM

## 2023-10-01 NOTE — Progress Notes (Signed)
   10/01/23 1841  BiPAP/CPAP/SIPAP  BiPAP/CPAP/SIPAP Pt Type Adult  BiPAP/CPAP/SIPAP Resmed  Mask Type Nasal mask  Dentures removed? Not applicable  FiO2 (%)  (3L O2 bled in)  Patient Home Machine Yes  Patient Home Mask Yes  Patient Home Tubing Yes  BiPAP/CPAP /SiPAP Vitals  Pulse Rate 66  Resp 20  SpO2 95 %  Bilateral Breath Sounds Clear;Diminished  MEWS Score/Color  MEWS Score 0  MEWS Score Color Green   Pt wearing home machine with home mask and tubing. Called to adapt O2. 3L bled in.

## 2023-10-02 ENCOUNTER — Other Ambulatory Visit (HOSPITAL_COMMUNITY): Payer: Self-pay | Admitting: Cardiology

## 2023-10-02 DIAGNOSIS — J189 Pneumonia, unspecified organism: Secondary | ICD-10-CM | POA: Diagnosis not present

## 2023-10-02 LAB — COMPREHENSIVE METABOLIC PANEL WITH GFR
ALT: 61 U/L — ABNORMAL HIGH (ref 0–44)
AST: 60 U/L — ABNORMAL HIGH (ref 15–41)
Albumin: <1.5 g/dL — ABNORMAL LOW (ref 3.5–5.0)
Alkaline Phosphatase: 63 U/L (ref 38–126)
Anion gap: 4 — ABNORMAL LOW (ref 5–15)
BUN: 17 mg/dL (ref 6–20)
CO2: 30 mmol/L (ref 22–32)
Calcium: 7.5 mg/dL — ABNORMAL LOW (ref 8.9–10.3)
Chloride: 102 mmol/L (ref 98–111)
Creatinine, Ser: 0.79 mg/dL (ref 0.61–1.24)
GFR, Estimated: >60 mL/min (ref >60)
Glucose, Bld: 91 mg/dL (ref 70–99)
Potassium: 3.9 mmol/L (ref 3.5–5.1)
Sodium: 136 mmol/L (ref 135–145)
Total Bilirubin: 0.4 mg/dL (ref 0.0–1.2)
Total Protein: 4.1 g/dL — ABNORMAL LOW (ref 6.5–8.1)

## 2023-10-02 LAB — PHOSPHORUS: Phosphorus: 4.1 mg/dL (ref 2.5–4.6)

## 2023-10-02 LAB — MAGNESIUM: Magnesium: 2 mg/dL (ref 1.7–2.4)

## 2023-10-02 LAB — CBC
HCT: 42.7 % (ref 39.0–52.0)
Hemoglobin: 14.4 g/dL (ref 13.0–17.0)
MCH: 31.8 pg (ref 26.0–34.0)
MCHC: 33.7 g/dL (ref 30.0–36.0)
MCV: 94.3 fL (ref 80.0–100.0)
Platelets: 157 10*3/uL (ref 150–400)
RBC: 4.53 MIL/uL (ref 4.22–5.81)
RDW: 14.6 % (ref 11.5–15.5)
WBC: 12.3 10*3/uL — ABNORMAL HIGH (ref 4.0–10.5)
nRBC: 0 % (ref 0.0–0.2)

## 2023-10-02 LAB — SURGICAL PATHOLOGY

## 2023-10-02 LAB — STREP PNEUMONIAE URINARY ANTIGEN: Strep Pneumo Urinary Antigen: NEGATIVE

## 2023-10-02 MED ORDER — MELATONIN 5 MG PO TABS
5.0000 mg | ORAL_TABLET | Freq: Once | ORAL | Status: AC
  Administered 2023-10-02: 5 mg via ORAL
  Filled 2023-10-02: qty 1

## 2023-10-02 NOTE — Progress Notes (Signed)
   10/02/23 2344  BiPAP/CPAP/SIPAP  BiPAP/CPAP/SIPAP Pt Type Adult  BiPAP/CPAP/SIPAP Resmed  Mask Type Nasal mask  Dentures removed? Not applicable  FiO2 (%) 32 %  Flow Rate 3 lpm  Patient Home Machine Yes  Safety Check Completed by RT for Home Unit Yes, no issues noted  Patient Home Mask Yes  Patient Home Tubing Yes  Auto Titrate Yes  Device Plugged into RED Power Outlet Yes

## 2023-10-02 NOTE — Progress Notes (Signed)
 PROGRESS NOTE  Joseph [name]. ZOX:[id number] DOB: 10-Mar-1966 DOA: 10/01/2023 PCP: Richardean Chimera, MD   LOS: 1 day   Brief Narrative / Interim history: 57 year old male with history of alpha gal, hypertrophic cardiomyopathy, OSA, HOCM status post septal reduction therapy, cardiac amyloidosis, PAF, chronic hypotension, obesity, hepatic steatosis comes into the hospital with shortness of breath, found to be hypoxic.  CT angiogram was negative for PE however it did show pneumonia, he was started on antibiotics and admitted to the hospital  Subjective / 24h Interval events: He is doing well this morning, complains of a cough.  Denies any chest pain  Assesement and Plan: Principal Problem:   CAP (community acquired pneumonia) Active Problems:   History of TIA (transient ischemic attack)   Coronary artery disease involving native coronary artery of native heart with angina pectoris (HCC)   OSA (obstructive sleep apnea)   Allergy to alpha-gal   HOCM (hypertrophic obstructive cardiomyopathy) (HCC)   Constipation   Prolonged QT interval   Cardiac amyloidosis (HCC)   Hypothyroidism   Hypotension   Acute respiratory failure with hypoxia (HCC)   Pulmonary nodules   Principal problem Acute hypoxic respiratory failure due to community-acquired pneumonia - patient presented with cough, hypoxia, infiltrate on imaging.  He was started on antibiotics with ceftriaxone and doxycycline, continue.  Continue to monitor cultures, strep pneumo, Legionella - Wean off oxygen as tolerated  Active problems Cardiac amyloidosis, history of HOCM -clinically appears euvolemic, continue home medications, he was diagnosed with cardiac amyloid by Congo red staining.  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, he will benefit from weight loss  Scheduled Meds:  aspirin EC  81 mg Oral Daily   busPIRone  15 mg Oral BID   clopidogrel  75 mg Oral Daily   ezetimibe  10 mg Oral Daily   guaiFENesin  600 mg Oral BID   levothyroxine  200 mcg Oral QAC breakfast   midodrine  10 mg Oral TID WC   rosuvastatin  40 mg Oral Daily   Continuous Infusions:  sodium chloride 50 mL/hr at 10/01/23 2329   cefTRIAXone (ROCEPHIN)  IV     doxycycline (VIBRAMYCIN) IV Stopped (10/02/23 0143)   PRN Meds:.acetaminophen **OR** acetaminophen, albuterol, HYDROcodone-acetaminophen, oxyCODONE, polyethylene glycol  Current Outpatient Medications  Medication Instructions   alfuzosin (UROXATRAL) 10 mg, Oral, Daily   Ascorbic Acid (VITAMIN C PO) 1 tablet, Daily   aspirin EC 81 mg, Oral, Daily, Swallow whole.   busPIRone (BUSPAR) 15 mg, 2 times daily   Cholecalciferol (VITAMIN D-3 PO) 1 capsule, Daily at bedtime   clopidogrel (PLAVIX) 75 mg, Oral, Daily   Cyanocobalamin (B-12 PO) 1 tablet, Oral, Daily   diphenhydramine-acetaminophen (TYLENOL PM) 25-500 MG TABS tablet 1 tablet, Daily at bedtime   doxylamine (Sleep) (UNISOM) 25 mg, Daily at bedtime   EPINEPHrine (EPIPEN 2-PAK) 0.3 mg, Intramuscular, As needed   ezetimibe (  ZETIA) 10 mg, Oral, Daily   levothyroxine (SYNTHROID) 200 mcg, Oral, Daily before breakfast   midodrine (PROAMATINE) 10 mg, Oral, 3 times daily   Multiple Vitamins-Minerals (MULTIVITAMIN MEN 50+) TABS 1 tablet, Daily    nitroGLYCERIN (NITROSTAT) 0.4 mg, Every 5 min PRN   Oxycodone HCl 20 mg, Oral, As directed   polyethylene glycol (MIRALAX / GLYCOLAX) 17 g, Oral, Daily PRN   Pyridoxine HCl (VITAMIN B-6 PO) 1 tablet, Daily   rosuvastatin (CRESTOR) 40 mg, Oral, Daily    Diet Orders (From admission, onward)     Start     Ordered   10/01/23 2250  Diet Heart Room service appropriate? Yes; Fluid consistency: Thin  Diet effective now       Question Answer Comment  Room service appropriate? Yes   Fluid consistency: Thin      10/01/23 2249            DVT prophylaxis: SCDs Start: 10/01/23 2250   Lab Results  Component Value Date   PLT 157 10/02/2023      Code Status: Full Code  Family Communication: Spouse at bedside  Status is: Inpatient Remains inpatient appropriate because: Severity of illness   Level of care: Progressive  Consultants:  None  Objective: Vitals:   10/01/23 2300 10/02/23 0050 10/02/23 0209 10/02/23 0530  BP: 110/72  110/72 115/76  Pulse: 66  60 69  Resp:   19 14  Temp:  98 F (36.7 C) 98 F (36.7 C)   TempSrc:  Oral Oral   SpO2: 95%  98% 94%  Weight:      Height:       No intake or output data in the 24 hours ending 10/02/23 0558 Wt Readings from Last 3 Encounters:  10/01/23 126 kg  09/01/23 126 kg  08/25/23 125.3 kg    Examination:  Constitutional: NAD Eyes: no scleral icterus ENMT: Mucous membranes are moist.  Neck: normal, supple Respiratory: Faint rhonchi, no wheezing Cardiovascular: Regular rate and rhythm, no murmurs / rubs / gallops.  1+ lower extremity edema Abdomen: non distended, no tenderness. Bowel sounds positive.  Musculoskeletal: no clubbing / cyanosis.    Data Reviewed: I have independently reviewed following labs and imaging studies   CBC Recent Labs  Lab 09/28/23 0731 10/01/23 1447 10/02/23 0442  WBC 8.3 18.1* 12.3*  HGB 15.7 16.4 14.4  HCT 46.9 49.8 42.7  PLT 184 184 157  MCV 93.4 94.1 94.3  MCH 31.3 31.0 31.8  MCHC  33.5 32.9 33.7  RDW 14.1 14.5 14.6  LYMPHSABS 1.7 1.2  --   MONOABS 0.8 0.9  --   EOSABS 0.1 0.0  --   BASOSABS 0.0 0.0  --     Recent Labs  Lab 10/01/23 1447 10/01/23 1448 10/01/23 1458 10/01/23 1635 10/01/23 2115 10/02/23 0442  NA 136  --   --   --   --  136  K 3.9  --   --   --   --  3.9  CL 101  --   --   --   --  102  CO2 28  --   --   --   --  30  GLUCOSE 114*  --   --   --   --  91  BUN 16  --   --   --   --  17  CREATININE 0.69  --   --   --   --  0.79  CALCIUM 7.8*  --   --   --   --  7.5*  AST 97*  --   --   --   --  60*  ALT 85*  --   --   --   --  61*  ALKPHOS 80  --   --   --   --  63  BILITOT 0.8  --   --   --   --  0.4  ALBUMIN 1.6*  --   --   --   --  <1.5*  MG  --   --   --   --  2.3 2.0  DDIMER 1.42*  --   --   --   --   --   PROCALCITON  --   --   --   --  <0.10  --   LATICACIDVEN  --   --  1.6 1.1  --   --   INR 1.1  --   --   --   --   --   TSH  --  2.835  --   --   --   --   BNP 374.3*  --   --   --   --   --     ------------------------------------------------------------------------------------------------------------------ No results for input(s): "CHOL", "HDL", "LDLCALC", "TRIG", "CHOLHDL", "LDLDIRECT" in the last 72 hours.  Lab Results  Component Value Date   HGBA1C 5.8 03/31/2023   ------------------------------------------------------------------------------------------------------------------ Recent Labs    10/01/23 1448  TSH 2.835    Cardiac Enzymes No results for input(s): "CKMB", "TROPONINI", "MYOGLOBIN" in the last 168 hours.  Invalid input(s): "CK" ------------------------------------------------------------------------------------------------------------------    Component Value Date/Time   BNP 374.3 (H) 10/01/2023 1447    CBG: No results for input(s): "GLUCAP" in the last 168 hours.  Recent Results (from the past 240 hours)  Resp panel by RT-PCR (RSV, Flu A&B, Covid) Peripheral     Status: None   Collection Time:  10/01/23  2:47 PM   Specimen: Peripheral; Nasal Swab  Result Value Ref Range Status   SARS Coronavirus 2 by RT PCR NEGATIVE NEGATIVE Final    Comment: (NOTE) SARS-CoV-2 target nucleic acids are NOT DETECTED.  The SARS-CoV-2 RNA is generally detectable in upper respiratory specimens during the acute phase of infection. The lowest concentration of SARS-CoV-2 viral copies this assay can detect is 138 copies/mL. A negative result does not preclude SARS-Cov-2 infection and should not be used as the sole basis for treatment or other patient management decisions. A negative result may occur with  improper specimen collection/handling, submission of specimen other than nasopharyngeal swab, presence of viral mutation(s) within the areas targeted by this assay, and inadequate number of viral copies(<138 copies/mL). A negative result must be combined with clinical observations, patient history, and epidemiological information. The expected result is Negative.  Fact Sheet for Patients:  BloggerCourse.com  Fact Sheet for Healthcare Providers:  SeriousBroker.it  This test is no t yet approved or cleared by the [place]  FDA and  has been authorized for detection and/or diagnosis of SARS-CoV-2 by FDA under an Emergency Use Authorization (EUA). This EUA will remain  in effect (meaning this test can be used) for the duration of the COVID-19 declaration under Section 564(b)(1) of the Act, 21 U.S.C.section 360bbb-3(b)(1), unless the authorization is terminated  or revoked sooner.       Influenza A by PCR NEGATIVE NEGATIVE Final   Influenza B by PCR NEGATIVE NEGATIVE Final    Comment: (NOTE) The Xpert Xpress SARS-CoV-2/FLU/RSV plus assay is intended as an aid in the  diagnosis of influenza from Nasopharyngeal swab specimens and should not be used as a sole basis for treatment. Nasal washings and aspirates are unacceptable for Xpert Xpress  SARS-CoV-2/FLU/RSV testing.  Fact Sheet for Patients: BloggerCourse.com  Fact Sheet for Healthcare Providers: SeriousBroker.it  This test is not yet approved or cleared by the [place] FDA and has been authorized for detection and/or diagnosis of SARS-CoV-2 by FDA under an Emergency Use Authorization (EUA). This EUA will remain in effect (meaning this test can be used) for the duration of the COVID-19 declaration under Section 564(b)(1) of the Act, 21 U.S.C. section 360bbb-3(b)(1), unless the authorization is terminated or revoked.     Resp Syncytial Virus by PCR NEGATIVE NEGATIVE Final    Comment: (NOTE) Fact Sheet for Patients: BloggerCourse.com  Fact Sheet for Healthcare Providers: SeriousBroker.it  This test is not yet approved or cleared by the [place] FDA and has been authorized for detection and/or diagnosis of SARS-CoV-2 by FDA under an Emergency Use Authorization (EUA). This EUA will remain in effect (meaning this test can be used) for the duration of the COVID-19 declaration under Section 564(b)(1) of the Act, 21 U.S.C. section 360bbb-3(b)(1), unless the authorization is terminated or revoked.  Performed at [facility], [address] [place], [place] [address]      Radiology Studies: CT Angio Chest PE W/Cm &/Or Wo Cm Result Date: 10/01/2023 CLINICAL DATA:  Positive D-dimer and shortness of breath, initial EXAM: CT ANGIOGRAPHY CHEST WITH CONTRAST TECHNIQUE: Multidetector CT imaging of the chest was performed using the standard protocol during bolus administration of intravenous contrast. Multiplanar CT image reconstructions and MIPs were obtained to evaluate the vascular anatomy. RADIATION DOSE REDUCTION: This exam was performed according to the departmental dose-optimization program which includes automated exposure control, adjustment  of the mA and/or kV according to patient size and/or use of iterative reconstruction technique. CONTRAST:  OMNIPAQUE IOHEXOL 350 MG/ML SOLN COMPARISON:  Chest x-ray from earlier in the same day. FINDINGS: Cardiovascular: Thoracic aorta and its branches demonstrate mild atherosclerotic calcification without aneurysmal dilatation. No dissection is seen. No cardiac enlargement is noted. The pulmonary artery shows a normal branching pattern bilaterally. No filling defect to suggest pulmonary embolism is noted. Scattered coronary calcifications are noted as well. Mediastinum/Nodes: Thoracic inlet is within normal limits. No hilar or mediastinal adenopathy is noted. The esophagus as visualized is within normal limits. Lungs/Pleura: Lungs are well aerated bilaterally. Some scattered ground-glass opacities are noted throughout the right upper lobe but measuring less than 5 mm. Focal early infiltrate is noted in the left lingula with some bronchial wall thickening. Minimal left effusion is noted as well. Upper Abdomen: Visualized upper abdomen demonstrates a cluster of nonobstructing left renal calculi the largest of which measures 11 mm. Bilateral adrenal lesions are noted and stable in appearance from previous exam with hypodense features consistent with benign adenomas. No further follow-up is recommended. Musculoskeletal: No acute bony abnormality is noted. Review of the MIP images confirms the above findings. IMPRESSION: No evidence of pulmonary emboli. Focal early infiltrate is noted in the lingula similar to that seen on prior plain film. Multiple small pulmonary nodules. Most significant: Ground-glass pulmonary nodule measuring 4 mm. Per Fleischner Society Guidelines, recommend a non-contrast Chest CT at 3-6 months. If stable, consider follow-up non-contrast Chest CTs at 2 and 4 years. These guidelines do not apply to immunocompromised patients and patients with cancer. Follow up in patients with significant  comorbidities as clinically warranted. For lung cancer screening,  adhere to Lung-RADS guidelines. Reference: Radiology. 2017; 284(1):228-43. Aortic Atherosclerosis (ICD10-I70.0). Electronically Signed   By: Violeta Grey M.D.   On: 10/01/2023 20:11   DG Chest Port 1 View Result Date: 10/01/2023 CLINICAL DATA:  Sepsis. EXAM: PORTABLE CHEST 1 VIEW COMPARISON:  08/22/2023. FINDINGS: The heart is mildly enlarged and mediastinal contours are within normal limits. Lung volumes are low with strandy opacities at the lung bases. No effusion or pneumothorax is seen. Sternotomy wires are noted over the midline. IMPRESSION: Low lung volumes with strandy atelectasis or infiltrate at the lung bases. Electronically Signed   By: Wyvonnia Heimlich M.D.   On: 10/01/2023 15:49     Kathlen Para, MD, PhD [facility]  Between 7 am - 7 pm I am available, please contact me via Amion (for emergencies) or Securechat (non urgent messages)  Between 7 pm - 7 am I am not available, please contact night coverage MD/APP via Amion

## 2023-10-02 NOTE — ED Notes (Signed)
 Patient applied home CPAP machine and is currently sleeping at this time.

## 2023-10-03 DIAGNOSIS — J189 Pneumonia, unspecified organism: Secondary | ICD-10-CM | POA: Diagnosis not present

## 2023-10-03 LAB — COMPREHENSIVE METABOLIC PANEL WITH GFR
ALT: 67 U/L — ABNORMAL HIGH (ref 0–44)
AST: 76 U/L — ABNORMAL HIGH (ref 15–41)
Albumin: <1.5 g/dL — ABNORMAL LOW (ref 3.5–5.0)
Alkaline Phosphatase: 75 U/L (ref 38–126)
Anion gap: 9 (ref 5–15)
BUN: 19 mg/dL (ref 6–20)
CO2: 26 mmol/L (ref 22–32)
Calcium: 7.8 mg/dL — ABNORMAL LOW (ref 8.9–10.3)
Chloride: 103 mmol/L (ref 98–111)
Creatinine, Ser: 0.69 mg/dL (ref 0.61–1.24)
GFR, Estimated: >60 mL/min (ref >60)
Glucose, Bld: 101 mg/dL — ABNORMAL HIGH (ref 70–99)
Potassium: 3.9 mmol/L (ref 3.5–5.1)
Sodium: 138 mmol/L (ref 135–145)
Total Bilirubin: 0.5 mg/dL (ref 0.0–1.2)
Total Protein: 4.4 g/dL — ABNORMAL LOW (ref 6.5–8.1)

## 2023-10-03 LAB — RESPIRATORY PANEL BY PCR

## 2023-10-03 LAB — CBC
HCT: 45.4 % (ref 39.0–52.0)
Hemoglobin: 14.9 g/dL (ref 13.0–17.0)
MCH: 31.2 pg (ref 26.0–34.0)
MCHC: 32.8 g/dL (ref 30.0–36.0)
MCV: 95.2 fL (ref 80.0–100.0)
Platelets: 167 10*3/uL (ref 150–400)
RBC: 4.77 MIL/uL (ref 4.22–5.81)
RDW: 14.4 % (ref 11.5–15.5)
WBC: 9.8 10*3/uL (ref 4.0–10.5)
nRBC: 0 % (ref 0.0–0.2)

## 2023-10-03 LAB — MAGNESIUM: Magnesium: 1.9 mg/dL (ref 1.7–2.4)

## 2023-10-03 MED ORDER — DOXYCYCLINE HYCLATE 100 MG PO TABS
100.0000 mg | ORAL_TABLET | Freq: Two times a day (BID) | ORAL | Status: DC
  Administered 2023-10-03 – 2023-10-04 (×3): 100 mg via ORAL
  Filled 2023-10-03 (×3): qty 1

## 2023-10-03 MED ORDER — FUROSEMIDE 10 MG/ML IJ SOLN
40.0000 mg | Freq: Once | INTRAMUSCULAR | Status: AC
  Administered 2023-10-03: 40 mg via INTRAVENOUS
  Filled 2023-10-03: qty 4

## 2023-10-03 MED ORDER — TRAZODONE HCL 50 MG PO TABS
50.0000 mg | ORAL_TABLET | Freq: Every day | ORAL | Status: DC
  Administered 2023-10-03: 50 mg via ORAL
  Filled 2023-10-03: qty 1

## 2023-10-03 NOTE — Progress Notes (Signed)
SATURATION QUALIFICATIONS: (This note is used to comply with regulatory documentation for home oxygen)  Patient Saturations on Room Air at Rest = 91%  Patient Saturations on Room Air while Ambulating = 90%   

## 2023-10-03 NOTE — Progress Notes (Signed)
 PROGRESS NOTE  Joseph [name]. WUJ:[id number] DOB: 24-Sep-1965 DOA: 10/01/2023 PCP: Richardean Chimera, MD   LOS: 2 days   Brief Narrative / Interim history: 58 year old male with history of alpha gal, hypertrophic cardiomyopathy, OSA, HOCM status post septal reduction therapy, cardiac amyloidosis, PAF, chronic hypotension, obesity, hepatic steatosis comes into the hospital with shortness of breath, found to be hypoxic.  CT angiogram was negative for PE however it did show pneumonia, he was started on antibiotics and admitted to the hospital  Subjective / 24h Interval events: Has more sinus congestion this morning, and a sore throat.  Has been coughing up more  Assesement and Plan: Principal Problem:   CAP (community acquired pneumonia) Active Problems:   History of TIA (transient ischemic attack)   Coronary artery disease involving native coronary artery of native heart with angina pectoris (HCC)   OSA (obstructive sleep apnea)   Allergy to alpha-gal   HOCM (hypertrophic obstructive cardiomyopathy) (HCC)   Constipation   Prolonged QT interval   Cardiac amyloidosis (HCC)   Hypothyroidism   Hypotension   Acute respiratory failure with hypoxia (HCC)   Pulmonary nodules   Principal problem Acute hypoxic respiratory failure due to community-acquired pneumonia - patient presented with cough, hypoxia, infiltrate on imaging.  He was started on antibiotics with ceftriaxone and doxycycline, continue.   -His cultures are negative so far - Wean off oxygen as tolerated - With sore throat and increased congestion will rule out other viruses with full respiratory virus panel  Active problems Cardiac amyloidosis, history of HOCM -clinically appears fluid up, continue home medications, he was diagnosed with cardiac amyloid  - Has a degree of chronic lower extremity swelling, has been intolerant of furosemide in the past with hypotension.  He is now normotensive and has clear fluid overload,  reintroduce Lasix and see how he does.  This will help his breathing also.  Chronic hypotension-continue midodrine  Hypothyroidism-continue Synthroid  Nephrotic range proteinuria -given progressive renal involvement, there are discussions as an outpatient that he will need to have tissue diagnosis.  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, he will benefit from weight loss  Scheduled Meds:  aspirin EC  81 mg Oral Daily   busPIRone  15 mg Oral BID   clopidogrel  75 mg Oral Daily   doxycycline  100 mg Oral Q12H   ezetimibe  10 mg Oral Daily   guaiFENesin  600 mg Oral BID   levothyroxine  200 mcg Oral QAC breakfast   midodrine  10 mg Oral TID WC   rosuvastatin  40 mg Oral Daily   Continuous Infusions:  cefTRIAXone (ROCEPHIN)  IV 200 mL/hr at 10/02/23 1800   PRN Meds:.acetaminophen **OR** acetaminophen, albuterol, HYDROcodone-acetaminophen, oxyCODONE, polyethylene glycol  Current Outpatient Medications  Medication Instructions   alfuzosin (UROXATRAL) 10 mg, Oral, Daily   Ascorbic Acid (VITAMIN C PO) 1 tablet, Daily   aspirin EC 81 mg, Oral, Daily, Swallow whole.   busPIRone (BUSPAR) 15 mg, 2 times daily   Cholecalciferol (VITAMIN D-3 PO) 1 capsule, Daily at bedtime   clopidogrel (PLAVIX)  75 mg, Oral, Daily   Cyanocobalamin (B-12 PO) 1 tablet, Oral, Daily   diphenhydramine-acetaminophen (TYLENOL PM) 25-500 MG TABS tablet 1 tablet, Daily at bedtime   doxylamine (Sleep) (UNISOM) 25 mg, Daily at bedtime   EPINEPHrine (EPIPEN 2-PAK) 0.3  mg, Intramuscular, As needed   ezetimibe (ZETIA) 10 mg, Oral, Daily   levothyroxine (SYNTHROID) 200 mcg, Oral, Daily before breakfast   midodrine (PROAMATINE) 10 mg, Oral, 3 times daily   Multiple Vitamins-Minerals (MULTIVITAMIN MEN 50+) TABS 1 tablet, Daily   nitroGLYCERIN (NITROSTAT) 0.4 mg, Every 5 min PRN   Oxycodone HCl 20 mg, Oral, As directed   polyethylene glycol (MIRALAX / GLYCOLAX) 17 g, Oral, Daily PRN   Pyridoxine HCl (VITAMIN B-6 PO) 1 tablet, Daily   rosuvastatin (CRESTOR) 40 mg, Oral, Daily    Diet Orders (From admission, onward)     Start     Ordered   10/02/23 1229  Diet regular Fluid consistency: Thin  Diet effective now       Question:  Fluid consistency:  Answer:  Thin   10/02/23 1228            DVT prophylaxis: SCDs Start: 10/01/23 2250   Lab Results  Component Value Date   PLT 167 10/03/2023      Code Status: Full Code  Family Communication: Spouse at bedside  Status is: Inpatient Remains inpatient appropriate because: Severity of illness   Level of care: Telemetry  Consultants:  None  Objective: Vitals:   10/02/23 1752 10/02/23 2233 10/03/23 0450 10/03/23 0907  BP:  118/72 106/71 (!) 137/92  Pulse:  60 (!) 57 73  Resp:  15 15 17   Temp:  97.6 F (36.4 C) 98.5 F (36.9 C)   TempSrc:  Oral    SpO2: 94% 95% 95% 96%  Weight:      Height:        Intake/Output Summary (Last 24 hours) at 10/03/2023 0934 Last data filed at 10/03/2023 0324 Gross per 24 hour  Intake 1451.22 ml  Output --  Net 1451.22 ml   Wt Readings from Last 3 Encounters:  10/01/23 126 kg  09/01/23 126 kg  08/25/23 125.3 kg    Examination:  Constitutional: NAD Eyes: lids and conjunctivae normal, no scleral icterus ENMT: mmm Neck: normal, supple Respiratory: clear to auscultation bilaterally, no wheezing, no crackles. Normal respiratory effort.  Cardiovascular: Regular rate and rhythm, no murmurs / rubs / gallops.  2+ pitting edema Abdomen: soft, no  distention, no tenderness. Bowel sounds positive.    Data Reviewed: I have independently reviewed following labs and imaging studies   CBC Recent Labs  Lab 09/28/23 0731 10/01/23 1447 10/02/23 0442 10/03/23 0501  WBC 8.3 18.1* 12.3* 9.8  HGB 15.7 16.4 14.4 14.9  HCT 46.9 49.8 42.7 45.4  PLT 184 184 157 167  MCV 93.4 94.1 94.3 95.2  MCH 31.3 31.0 31.8 31.2  MCHC 33.5 32.9 33.7 32.8  RDW 14.1 14.5 14.6 14.4  LYMPHSABS 1.7 1.2  --   --   MONOABS 0.8 0.9  --   --   EOSABS 0.1 0.0  --   --   BASOSABS 0.0 0.0  --   --     Recent Labs  Lab 10/01/23 1447 10/01/23 1448 10/01/23 1458 10/01/23 1635 10/01/23 2115 10/02/23 0442 10/03/23 0501  NA 136  --   --   --   --  136 138  K 3.9  --   --   --   --  3.9 3.9  CL 101  --   --   --   --  102 103  CO2 28  --   --   --   --  30 26  GLUCOSE 114*  --   --   --   --  91 101*  BUN 16  --   --   --   --  17 19  CREATININE 0.69  --   --   --   --  0.79 0.69  CALCIUM 7.8*  --   --   --   --  7.5* 7.8*  AST 97*  --   --   --   --  60* 76*  ALT 85*  --   --   --   --  61* 67*  ALKPHOS 80  --   --   --   --  63 75  BILITOT 0.8  --   --   --   --  0.4 0.5  ALBUMIN 1.6*  --   --   --   --  <1.5* <1.5*  MG  --   --   --   --  2.3 2.0 1.9  DDIMER 1.42*  --   --   --   --   --   --   PROCALCITON  --   --   --   --  <0.10  --   --   LATICACIDVEN  --   --  1.6 1.1  --   --   --   INR 1.1  --   --   --   --   --   --   TSH  --  2.835  --   --   --   --   --   BNP 374.3*  --   --   --   --   --   --     ------------------------------------------------------------------------------------------------------------------ No results for input(s): "CHOL", "HDL", "LDLCALC", "TRIG", "CHOLHDL", "LDLDIRECT" in the last 72 hours.  Lab Results  Component Value Date   HGBA1C 5.8 03/31/2023   ------------------------------------------------------------------------------------------------------------------ Recent Labs    10/01/23 1448  TSH 2.835     Cardiac Enzymes No results for input(s): "CKMB", "TROPONINI", "MYOGLOBIN" in the last 168 hours.  Invalid input(s): "CK" ------------------------------------------------------------------------------------------------------------------    Component Value Date/Time   BNP 374.3 (H) 10/01/2023 1447    CBG: No results for input(s): "GLUCAP" in the last 168 hours.  Recent Results (from the past 240 hours)  Blood Culture (routine x 2)     Status: None (Preliminary result)   Collection Time: 10/01/23  2:45 PM   Specimen: BLOOD  Result Value Ref Range Status   Specimen Description   Final    BLOOD BLOOD RIGHT ARM Performed at [facility], [address] [place], [place] [address]    Special Requests   Final    BOTTLES DRAWN AEROBIC AND ANAEROBIC Blood Culture adequate volume Performed at [facility], [address] [place], [place] [address]    Culture   Final    NO GROWTH 2 DAYS Performed at [facility], [address], [place], [place] [address]    Report Status PENDING  Incomplete  Resp panel by RT-PCR (RSV, Flu A&B, Covid) Peripheral     Status: None   Collection Time: 10/01/23  2:47 PM   Specimen: Peripheral; Nasal Swab  Result Value Ref Range Status   SARS Coronavirus 2  by RT PCR NEGATIVE NEGATIVE Final    Comment: (NOTE) SARS-CoV-2 target nucleic acids are NOT DETECTED.  The SARS-CoV-2 RNA is generally detectable in upper respiratory specimens during the acute phase of infection. The lowest concentration of SARS-CoV-2 viral copies this assay can detect is 138 copies/mL. A negative result does not preclude SARS-Cov-2 infection and should not be used as the sole basis for treatment or other patient management decisions. A negative result may occur with  improper specimen collection/handling, submission of specimen other than nasopharyngeal swab, presence of viral mutation(s) within the areas targeted by this assay, and  inadequate number of viral copies(<138 copies/mL). A negative result must be combined with clinical observations, patient history, and epidemiological information. The expected result is Negative.  Fact Sheet for Patients:  BloggerCourse.com  Fact Sheet for Healthcare Providers:  SeriousBroker.it  This test is no t yet approved or cleared by the [place] FDA and  has been authorized for detection and/or diagnosis of SARS-CoV-2 by FDA under an Emergency Use Authorization (EUA). This EUA will remain  in effect (meaning this test can be used) for the duration of the COVID-19 declaration under Section 564(b)(1) of the Act, 21 U.S.C.section 360bbb-3(b)(1), unless the authorization is terminated  or revoked sooner.       Influenza A by PCR NEGATIVE NEGATIVE Final   Influenza B by PCR NEGATIVE NEGATIVE Final    Comment: (NOTE) The Xpert Xpress SARS-CoV-2/FLU/RSV plus assay is intended as an aid in the diagnosis of influenza from Nasopharyngeal swab specimens and should not be used as a sole basis for treatment. Nasal washings and aspirates are unacceptable for Xpert Xpress SARS-CoV-2/FLU/RSV testing.  Fact Sheet for Patients: BloggerCourse.com  Fact Sheet for Healthcare Providers: SeriousBroker.it  This test is not yet approved or cleared by the [place] FDA and has been authorized for detection and/or diagnosis of SARS-CoV-2 by FDA under an Emergency Use Authorization (EUA). This EUA will remain in effect (meaning this test can be used) for the duration of the COVID-19 declaration under Section 564(b)(1) of the Act, 21 U.S.C. section 360bbb-3(b)(1), unless the authorization is terminated or revoked.     Resp Syncytial Virus by PCR NEGATIVE NEGATIVE Final    Comment: (NOTE) Fact Sheet for Patients: BloggerCourse.com  Fact Sheet for Healthcare  Providers: SeriousBroker.it  This test is not yet approved or cleared by the [place] FDA and has been authorized for detection and/or diagnosis of SARS-CoV-2 by FDA under an Emergency Use Authorization (EUA). This EUA will remain in effect (meaning this test can be used) for the duration of the COVID-19 declaration under Section 564(b)(1) of the Act, 21 U.S.C. section 360bbb-3(b)(1), unless the authorization is terminated or revoked.  Performed at [facility], [address], [place], [place] [address]   Blood Culture (routine x 2)     Status: None (Preliminary result)   Collection Time: 10/01/23  4:27 PM   Specimen: BLOOD  Result Value Ref Range Status   Specimen Description   Final    BLOOD BLOOD LEFT ARM Performed at [facility], [address] [place], [place] [address]    Special Requests   Final    BOTTLES DRAWN AEROBIC AND ANAEROBIC Blood Culture adequate volume Performed at [facility], [address], [place], [place] [address]    Culture   Final    NO GROWTH 2 DAYS Performed at [facility], [address], [place], [place] [address]  Report Status PENDING  Incomplete     Radiology Studies: No results found.    Kathlen Para, MD, PhD [facility]  Between 7 am - 7 pm I am available, please contact me via Amion (for emergencies) or Securechat (non urgent messages)  Between 7 pm - 7 am I am not available, please contact night coverage MD/APP via Amion

## 2023-10-03 NOTE — TOC Initial Note (Signed)
 Transition of Care (TOC) - Initial/Assessment Note    Patient Details  Name: Joseph [name]. MRN: [id number] Date of Birth: July 09, 1965  Transition of Care [facility]) CM/SW Contact:    Lanier Clam, RN Phone Number: 10/03/2023, 10:43 AM  Clinical Narrative: d/c plan home.                  Expected Discharge Plan: Home/Self Care Barriers to Discharge: Continued Medical Work up   Patient Goals and CMS Choice Patient states their goals for this hospitalization and ongoing recovery are:: Home CMS Medicare.gov Compare Post Acute Care list provided to:: Patient   [place] ownership interest in [facility].provided to:: Patient    Expected Discharge Plan and Services                                              Prior Living Arrangements/Services                       Activities of Daily Living   ADL Screening (condition at time of admission) Independently performs ADLs?: Yes (appropriate for developmental age) Is the patient deaf or have difficulty hearing?: No Does the patient have difficulty seeing, even when wearing glasses/contacts?: No Does the patient have difficulty concentrating, remembering, or making decisions?: No  Permission Sought/Granted                  Emotional Assessment              Admission diagnosis:  Hypoxia [R09.02] Pulmonary nodule [R91.1] CAP (community acquired pneumonia) [J18.9] Community acquired pneumonia, unspecified laterality [J18.9] Patient Active Problem List   Diagnosis Date Noted   CAP (community acquired pneumonia) 10/01/2023   Acute respiratory failure with hypoxia (HCC) 10/01/2023   Pulmonary nodules 10/01/2023   Nephrotic syndrome 08/23/2023   Hypotension 08/22/2023   Subacute ischemic stroke (HCC) 06/15/2023   Memory impairment 06/15/2023   Hypothyroidism 06/15/2023   History of CAD (coronary artery disease) 06/15/2023   History of TIA (transient ischemic attack) 06/15/2023    Hypokalemia 06/15/2023   Paroxysmal atrial fibrillation (HCC) 06/15/2023   Hypoxic respiratory failure since cardiac surgery 04/2023 (HCC) 06/15/2023   Panic disorder 06/15/2023   BPH (benign prostatic hyperplasia) 06/15/2023   History of hip surgery 06/15/2023   Pericardial effusion 06/13/2023   Left ventricular hypertrophy 05/30/2023   Prolonged QT interval 05/30/2023   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 (HCC) 04/14/2023   Constipation 01/19/2023   Postprandial abdominal bloating 01/19/2023   LLQ pain 01/19/2023   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   Palpitations 09/20/2022   Allergy to alpha-gal 12/16/2019   Angio-edema 06/17/2019   Allergic reaction 06/17/2019   Penicillin allergy 06/17/2019   Avascular necrosis of bone of left hip (HCC) 04/08/2015   Avascular necrosis of hip (HCC) 04/08/2015   Postoperative anemia due to acute blood loss 02/20/2014   Closed left hip fracture (HCC) 02/07/2014   OSA (obstructive sleep apnea) 02/07/2014   Essential hypertension 11/29/2010   Hyperlipidemia 09/08/2009   Coronary artery disease involving native coronary artery of native heart with angina pectoris (HCC) 09/08/2009   PCP:  Richardean Chimera, MD Pharmacy:   [facility] [place], [place] - [address] [place] [address] Phone:  [phone] Fax: [phone]     Social Drivers of Health (SDOH) Social History: SDOH Screenings   Food Insecurity: No Food Insecurity (10/02/2023)  Housing: Low Risk  (10/02/2023)  Transportation Needs: No Transportation Needs (10/02/2023)  Utilities: Not At Risk (10/02/2023)  Depression (PHQ2-9): Medium Risk (06/27/2023)  Financial Resource Strain: Low Risk  (12/31/2020)   Received from [facility], [facility]  Physical Activity: Inactive (12/31/2020)   Received from [facility], [facility]  Social  Connections: Unknown (11/01/2021)   [facility] from [facility], [facility]  Stress: No Stress Concern Present (12/31/2020)   Received from [facility], [facility]  Tobacco Use: Medium Risk (10/01/2023)   SDOH Interventions:     Readmission Risk Interventions     No data to display

## 2023-10-04 DIAGNOSIS — J189 Pneumonia, unspecified organism: Secondary | ICD-10-CM | POA: Diagnosis not present

## 2023-10-04 LAB — COMPREHENSIVE METABOLIC PANEL WITH GFR
ALT: 70 U/L — ABNORMAL HIGH (ref 0–44)
AST: 77 U/L — ABNORMAL HIGH (ref 15–41)
Albumin: <1.5 g/dL — ABNORMAL LOW (ref 3.5–5.0)
Alkaline Phosphatase: 82 U/L (ref 38–126)
Anion gap: 6 (ref 5–15)
BUN: 17 mg/dL (ref 6–20)
CO2: 28 mmol/L (ref 22–32)
Calcium: 7.7 mg/dL — ABNORMAL LOW (ref 8.9–10.3)
Chloride: 103 mmol/L (ref 98–111)
Creatinine, Ser: 0.6 mg/dL — ABNORMAL LOW (ref 0.61–1.24)
GFR, Estimated: >60 mL/min (ref >60)
Glucose, Bld: 97 mg/dL (ref 70–99)
Potassium: 3.7 mmol/L (ref 3.5–5.1)
Sodium: 137 mmol/L (ref 135–145)
Total Bilirubin: 0.5 mg/dL (ref 0.0–1.2)
Total Protein: 4.5 g/dL — ABNORMAL LOW (ref 6.5–8.1)

## 2023-10-04 LAB — CBC
HCT: 45.6 % (ref 39.0–52.0)
Hemoglobin: 15 g/dL (ref 13.0–17.0)
MCH: 31.3 pg (ref 26.0–34.0)
MCHC: 32.9 g/dL (ref 30.0–36.0)
MCV: 95 fL (ref 80.0–100.0)
Platelets: 195 10*3/uL (ref 150–400)
RBC: 4.8 MIL/uL (ref 4.22–5.81)
RDW: 14.4 % (ref 11.5–15.5)
WBC: 9.1 10*3/uL (ref 4.0–10.5)
nRBC: 0 % (ref 0.0–0.2)

## 2023-10-04 LAB — MAGNESIUM: Magnesium: 1.9 mg/dL (ref 1.7–2.4)

## 2023-10-04 LAB — LEGIONELLA PNEUMOPHILA SEROGP 1 UR AG: L. pneumophila Serogp 1 Ur Ag: NEGATIVE

## 2023-10-04 MED ORDER — GUAIFENESIN ER 600 MG PO TB12: 600.0000 mg | ORAL_TABLET | Freq: Two times a day (BID) | ORAL | 0 refills | Status: AC

## 2023-10-04 MED ORDER — FUROSEMIDE 10 MG/ML IJ SOLN
40.0000 mg | Freq: Once | INTRAMUSCULAR | Status: AC
  Administered 2023-10-04: 40 mg via INTRAVENOUS
  Filled 2023-10-04: qty 4

## 2023-10-04 MED ORDER — TRAZODONE HCL 50 MG PO TABS: 50.0000 mg | ORAL_TABLET | Freq: Every evening | ORAL | 0 refills | Status: AC | PRN

## 2023-10-04 MED ORDER — FUROSEMIDE 40 MG PO TABS
40.0000 mg | ORAL_TABLET | Freq: Every day | ORAL | 0 refills | Status: DC
Start: 2023-10-04 — End: 2023-10-16

## 2023-10-04 MED ORDER — SALINE SPRAY 0.65 % NA SOLN
1.0000 | NASAL | Status: DC | PRN
  Administered 2023-10-04: 1 via NASAL
  Filled 2023-10-04: qty 44

## 2023-10-04 MED ORDER — DOXYCYCLINE HYCLATE 100 MG PO CAPS: 100.0000 mg | ORAL_CAPSULE | Freq: Two times a day (BID) | ORAL | 0 refills | Status: AC

## 2023-10-04 NOTE — TOC Transition Note (Signed)
 Transition of Care [facility]) - Discharge Note   Patient Details  Name: Joseph [name]. MRN: [id number] Date of Birth: [birth year]  [facility] CM/SW Contact:  Ruben Corolla, RN Phone Number: 10/04/2023, 2:55 PM   Clinical Narrative:  d/c home     Final next level of care: Home/Self Care Barriers to Discharge: No Barriers Identified   Patient Goals and CMS Choice Patient states their goals for this hospitalization and ongoing recovery are:: Home CMS Medicare.gov Compare Post Acute Care list provided to:: Patient   [place] ownership interest in [facility].provided to:: Patient    Discharge Placement                       Discharge Plan and Services Additional resources added to the After Visit Summary for                                       Social Drivers of Health (SDOH) Interventions SDOH Screenings   Food Insecurity: No Food Insecurity (10/02/2023)  Housing: Low Risk  (10/02/2023)  Transportation Needs: No Transportation Needs (10/02/2023)  Utilities: Not At Risk (10/02/2023)  Depression (PHQ2-9): Medium Risk (06/27/2023)  Financial Resource Strain: Low Risk  (12/31/2020)   Received from [facility], [facility]  Physical Activity: Inactive (12/31/2020)   Received from [facility], [facility]  Social Connections: Unknown (11/01/2021)   Received from [facility], [facility]  Stress: No Stress Concern Present (12/31/2020)   Received from [facility], [facility]  Tobacco Use: Medium Risk (10/01/2023)     Readmission Risk Interventions     No data to display

## 2023-10-04 NOTE — Discharge Summary (Signed)
 Physician Discharge Summary  Joseph [name]. VWU:[id number] DOB: 21-Jul-1965 DOA: 10/01/2023  PCP: Richardean Chimera, MD  Admit date: 10/01/2023 Discharge date: 10/04/2023  Admitted From: home Disposition:  home  Recommendations for Outpatient Follow-up:  Follow up with PCP in 1-2 weeks Please obtain BMP/CBC in one week Follow up with Dr Candise Che next week as scheduled  Home Health: none Equipment/Devices: none  Discharge Condition: stable CODE STATUS: Full code Diet Orders (From admission, onward)     Start     Ordered   10/02/23 1229  Diet regular Fluid consistency: Thin  Diet effective now       Question:  Fluid consistency:  Answer:  Thin   10/02/23 1228           Brief Narrative / Interim history: 58 year old male with history of alpha gal, hypertrophic cardiomyopathy, OSA, HOCM status post septal reduction therapy, cardiac amyloidosis, PAF, chronic hypotension, obesity, hepatic steatosis comes into the hospital with shortness of breath, found to be hypoxic.  CT angiogram was negative for PE however it did show pneumonia, he was started on antibiotics and admitted to the hospital  Hospital Course / Discharge diagnoses: Principal Problem:   CAP (community acquired pneumonia) Active Problems:   History of TIA (transient ischemic attack)   Coronary artery disease involving native coronary artery of native heart with angina pectoris (HCC)   OSA (obstructive sleep apnea)   Allergy to alpha-gal   HOCM (hypertrophic obstructive cardiomyopathy) (HCC)   Constipation   Prolonged QT interval   Cardiac amyloidosis (HCC)   Hypothyroidism   Hypotension   Acute respiratory failure with hypoxia (HCC)   Pulmonary nodules  Principal problem Acute hypoxic respiratory failure due to community-acquired pneumonia - patient presented with cough, hypoxia, infiltrate on imaging.  He was started on antibiotics with ceftriaxone and doxycycline, with improvement in his respiratory status, has  been weaned off to room air, able to ambulate in the hallway feeling significantly improved.  COVID, RSV, influenza were all negative but he did test positive for metapneumovirus.  Given his relative immunosuppression, will cover for bacterial pneumonia as well, he will be transition to doxycycline for few additional days to complete the course.  He is stable to be discharged home.  Active problems Cardiac amyloidosis, history of HOCM -clinically appears fluid up, continue home medications, he was diagnosed with cardiac amyloid. Has a degree of chronic lower extremity swelling, has been intolerant of furosemide in the past with hypotension.  He is now normotensive and has clear fluid overload, Lasix was reintroduced and he tolerated that well.  Will place back on Lasix 40 mg daily. 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, he will benefit from weight loss Insomnia-responded very well to trazodone  Sepsis ruled out   Discharge Instructions   Allergies as of 10/04/2023       Reactions   Alpha-gal Anaphylaxis   Any mammalian meat or products   Beef-derived Drug Products    No beef  alpha -gal allergy   Penicillins Anaphylaxis   **CEFAZOLIN  received on 02/07/2014 and 04/08/2015 with no documented ADRs**   Pork-derived Products Other (See Comments)   No pork  alpha-gal allergy   Zestril [lisinopril] Anaphylaxis   Gadavist [gadobutrol]  Nausea And Vomiting   Pt vomits with Gadavist contrast 09/14/2022 during cardiac MRI scan    Ms Contin [morphine] Anxiety, Other (See Comments)   Panic attacks   Tetanus Toxoid Other (See Comments)   Unknown reaction        Medication List     STOP taking these medications    doxylamine (Sleep) 25 MG tablet Commonly known as: UNISOM       TAKE these medications    alfuzosin 10 MG 24 hr tablet Commonly known as: UROXATRAL Take 1 tablet (10 mg total) by mouth daily.   aspirin EC 81 MG tablet Take 1 tablet (81 mg total) by mouth daily. Swallow whole.   B-12 PO Take 1 tablet by mouth daily.   busPIRone 15 MG tablet Commonly known as: BUSPAR Take 15 mg by mouth 2 (two) times daily.   clopidogrel 75 MG tablet Commonly known as: PLAVIX Take 75 mg by mouth daily.   diphenhydramine-acetaminophen 25-500 MG Tabs tablet Commonly known as: TYLENOL PM Take 1 tablet by mouth at bedtime.   doxycycline 100 MG capsule Commonly known as: VIBRAMYCIN Take 1 capsule (100 mg total) by mouth 2 (two) times daily for 3 days.   EPINEPHrine 0.3 mg/0.3 mL Soaj injection Commonly known as: EpiPen 2-Pak Inject 0.3 mLs (0.3 mg total) into the muscle as needed for anaphylaxis.   ezetimibe 10 MG tablet Commonly known as: ZETIA Take 1 tablet (10 mg total) by mouth daily.   furosemide 40 MG tablet Commonly known as: Lasix Take 1 tablet (40 mg total) by mouth daily.   guaiFENesin 600 MG 12 hr tablet Commonly known as: MUCINEX Take 1 tablet (600 mg total) by mouth 2 (two) times daily for 5 days.   levothyroxine 200 MCG tablet Commonly known as: SYNTHROID Take 1 tablet (200 mcg total) by mouth daily before breakfast.   midodrine 10 MG tablet Commonly known as: PROAMATINE Take 10 mg by mouth 3 (three) times daily.   Multivitamin Men 50+ Tabs Take 1 tablet by mouth daily.   nitroGLYCERIN 0.4 MG SL tablet Commonly known as: NITROSTAT Place 0.4 mg under the tongue every 5 (five)  minutes as needed for chest pain.   Oxycodone HCl 20 MG Tabs Take 1 tablet (20 mg total) by mouth as directed. What changed:  when to take this reasons to take this   polyethylene glycol 17 g packet Commonly known as: MIRALAX / GLYCOLAX Take 17 g by mouth daily as needed for moderate constipation.   rosuvastatin 40 MG tablet Commonly known as: CRESTOR Take 1 tablet (40 mg total) by mouth daily.   traZODone 50 MG tablet Commonly known as: DESYREL Take 1 tablet (50 mg total) by mouth at bedtime as needed for sleep.   VITAMIN B-6 PO Take 1 tablet by mouth daily.   VITAMIN C PO Take 1 tablet by mouth daily.   VITAMIN D-3 PO Take 1 capsule by mouth at bedtime.       Consultations: none  Procedures/Studies:  CT Angio Chest PE W/Cm &/Or Wo Cm Result Date: 10/01/2023 CLINICAL DATA:  Positive D-dimer and shortness of breath, initial EXAM: CT ANGIOGRAPHY CHEST WITH CONTRAST TECHNIQUE: Multidetector CT imaging of the chest was performed using the standard protocol during bolus administration of intravenous contrast. Multiplanar CT image reconstructions  and MIPs were obtained to evaluate the vascular anatomy. RADIATION DOSE REDUCTION: This exam was performed according to the departmental dose-optimization program which includes automated exposure control, adjustment of the mA and/or kV according to patient size and/or use of iterative reconstruction technique. CONTRAST:  OMNIPAQUE IOHEXOL 350 MG/ML SOLN COMPARISON:  Chest x-ray from earlier in the same day. FINDINGS: Cardiovascular: Thoracic aorta and its branches demonstrate mild atherosclerotic calcification without aneurysmal dilatation. No dissection is seen. No cardiac enlargement is noted. The pulmonary artery shows a normal branching pattern bilaterally. No filling defect to suggest pulmonary embolism is noted. Scattered coronary calcifications are noted as well. Mediastinum/Nodes: Thoracic inlet is within normal limits. No  hilar or mediastinal adenopathy is noted. The esophagus as visualized is within normal limits. Lungs/Pleura: Lungs are well aerated bilaterally. Some scattered ground-glass opacities are noted throughout the right upper lobe but measuring less than 5 mm. Focal early infiltrate is noted in the left lingula with some bronchial wall thickening. Minimal left effusion is noted as well. Upper Abdomen: Visualized upper abdomen demonstrates a cluster of nonobstructing left renal calculi the largest of which measures 11 mm. Bilateral adrenal lesions are noted and stable in appearance from previous exam with hypodense features consistent with benign adenomas. No further follow-up is recommended. Musculoskeletal: No acute bony abnormality is noted. Review of the MIP images confirms the above findings. IMPRESSION: No evidence of pulmonary emboli. Focal early infiltrate is noted in the lingula similar to that seen on prior plain film. Multiple small pulmonary nodules. Most significant: Ground-glass pulmonary nodule measuring 4 mm. Per Fleischner Society Guidelines, recommend a non-contrast Chest CT at 3-6 months. If stable, consider follow-up non-contrast Chest CTs at 2 and 4 years. These guidelines do not apply to immunocompromised patients and patients with cancer. Follow up in patients with significant comorbidities as clinically warranted. For lung cancer screening, adhere to Lung-RADS guidelines. Reference: Radiology. 2017; 284(1):228-43. Aortic Atherosclerosis (ICD10-I70.0). Electronically Signed   By: Violeta Grey M.D.   On: 10/01/2023 20:11   DG Chest Port 1 View Result Date: 10/01/2023 CLINICAL DATA:  Sepsis. EXAM: PORTABLE CHEST 1 VIEW COMPARISON:  08/22/2023. FINDINGS: The heart is mildly enlarged and mediastinal contours are within normal limits. Lung volumes are low with strandy opacities at the lung bases. No effusion or pneumothorax is seen. Sternotomy wires are noted over the midline. IMPRESSION: Low lung  volumes with strandy atelectasis or infiltrate at the lung bases. Electronically Signed   By: Wyvonnia Heimlich M.D.   On: 10/01/2023 15:49   CT BONE MARROW BIOPSY & ASPIRATION Result Date: 09/28/2023 INDICATION: 23-year-old male history of suspected amyloidosis. EXAM: CT-GUIDED BONE MARROW BIOPSY AND ASPIRATION MEDICATIONS: 25 mg Benadryl, intravenous ANESTHESIA/SEDATION: Fentanyl 100 mcg IV; Versed 2 mg IV Sedation Time: 10 minutes; The patient was continuously monitored during the procedure by the interventional radiology nurse under my direct supervision. COMPLICATIONS: None immediate. PROCEDURE: Informed consent was obtained from the patient following an explanation of the procedure, risks, benefits and alternatives. The patient understands, agrees and consents for the procedure. All questions were addressed. A time out was performed prior to the initiation of the procedure. The patient was positioned prone and non-contrast localization CT was performed of the pelvis to demonstrate the iliac marrow spaces. The operative site was prepped and draped in the usual sterile fashion. Under sterile conditions and local anesthesia, a 22 gauge spinal needle was utilized for procedural planning. Next, an 11 gauge coaxial bone biopsy needle was advanced into the right iliac  marrow space. Needle position was confirmed with CT imaging. Initially, a bone marrow aspiration was performed. Next, a bone marrow biopsy was obtained with the 11 gauge outer bone marrow device. Delete the needle was removed and superficial hemostasis was obtained with manual compression. A dressing was applied. The patient tolerated the procedure well without immediate post procedural complication. IMPRESSION: Successful CT guided right iliac bone marrow aspiration and core biopsy. Marliss Coots, MD [facility] Electronically Signed   By: Marliss Coots M.D.   On: 09/28/2023 13:42   CARDIAC  EVENT MONITOR Result Date: 09/18/2023   Patient had a minimum heart rate of 54 bpm, maximum heart rate of 104 bpm, and average heart rate of 71 bpm. Predominant underlying rhythm was sinus rhythm. Isolated PACs were rare (<1.0%). Isolated PVCs were rare (<1.0%). No evidence of significant heart block. No triggered events No AF or AFL or heart block post myectomy and off of amiodarone.  No NSVT.     Subjective: - no chest pain, shortness of breath, no abdominal pain, nausea or vomiting.   Discharge Exam: BP (!) 149/88 (BP Location: Right Arm)   Pulse 74   Temp 97.8 F (36.6 C)   Resp 17   Ht 6' (1.829 m)   Wt 126 kg   SpO2 95%   BMI 37.67 kg/m   General: Pt is alert, awake, not in acute distress Cardiovascular: RRR, S1/S2 +, no rubs, no gallops Respiratory: CTA bilaterally, no wheezing, no rhonchi Abdominal: Soft, NT, ND, bowel sounds + Extremities: 2+ edema, no cyanosis  The results of significant diagnostics from this hospitalization (including imaging, microbiology, ancillary and laboratory) are listed below for reference.     Microbiology: Recent Results (from the past 240 hours)  Blood Culture (routine x 2)     Status: None (Preliminary result)   Collection Time: 10/01/23  2:45 PM   Specimen: BLOOD  Result Value Ref Range Status   Specimen Description   Final    BLOOD BLOOD RIGHT ARM Performed at [facility], [address], [place], [place] [address]    Special Requests   Final    BOTTLES DRAWN AEROBIC AND ANAEROBIC Blood Culture adequate volume Performed at [facility], [address], [place], [place] [address]    Culture   Final    NO GROWTH 3 DAYS Performed at [facility], [address], [place], [place] [address]    Report Status PENDING  Incomplete  Resp panel by RT-PCR (RSV, Flu A&B, Covid) Peripheral     Status: None   Collection Time: 10/01/23  2:47 PM   Specimen: Peripheral; Nasal Swab  Result Value Ref  Range Status   SARS Coronavirus 2 by RT PCR NEGATIVE NEGATIVE Final    Comment: (NOTE) SARS-CoV-2 target nucleic acids are NOT DETECTED.  The SARS-CoV-2 RNA is generally detectable in upper respiratory specimens during the acute phase of infection. The lowest concentration of SARS-CoV-2 viral copies this assay can detect is 138 copies/mL. A negative result does not preclude SARS-Cov-2 infection and should not be used as the sole basis for treatment or other patient management decisions. A negative result may occur with  improper specimen collection/handling, submission of specimen other than nasopharyngeal swab, presence of viral mutation(s) within the areas targeted by this assay, and inadequate number of viral copies(<138 copies/mL). A negative result must be combined with clinical observations, patient history, and epidemiological information. The expected result is Negative.  Fact Sheet for Patients:  BloggerCourse.com  Fact Sheet for Healthcare Providers:  SeriousBroker.it  This test is no t yet approved or cleared by the [place]  FDA and  has been authorized for detection and/or diagnosis of SARS-CoV-2 by FDA under an Emergency Use Authorization (EUA). This EUA will remain  in effect (meaning this test can be used) for the duration of the COVID-19 declaration under Section 564(b)(1) of the Act, 21 U.S.C.section 360bbb-3(b)(1), unless the authorization is terminated  or revoked sooner.       Influenza A by PCR NEGATIVE NEGATIVE Final   Influenza B by PCR NEGATIVE NEGATIVE Final    Comment: (NOTE) The Xpert Xpress SARS-CoV-2/FLU/RSV plus assay is intended as an aid in the diagnosis of influenza from Nasopharyngeal swab specimens and should not be used as a sole basis for treatment. Nasal washings and aspirates are unacceptable for Xpert Xpress SARS-CoV-2/FLU/RSV testing.  Fact Sheet for  Patients: BloggerCourse.com  Fact Sheet for Healthcare Providers: SeriousBroker.it  This test is not yet approved or cleared by the [place]  FDA and has been authorized for detection and/or diagnosis of SARS-CoV-2 by FDA under an Emergency Use Authorization (EUA). This EUA will remain in effect (meaning this test can be used) for the duration of the COVID-19 declaration under Section 564(b)(1) of the Act, 21 U.S.C. section 360bbb-3(b)(1), unless the authorization is terminated or revoked.     Resp Syncytial Virus by PCR NEGATIVE NEGATIVE Final    Comment: (NOTE) Fact Sheet for Patients: BloggerCourse.com  Fact Sheet for Healthcare Providers: SeriousBroker.it  This test is not yet approved or cleared by the [place]  FDA and has been authorized for detection and/or diagnosis of SARS-CoV-2 by FDA under an Emergency Use Authorization (EUA). This EUA will remain in effect (meaning this test can be used) for the duration of the COVID-19 declaration under Section 564(b)(1) of the Act, 21 U.S.C. section 360bbb-3(b)(1), unless the authorization is terminated or revoked.  Performed at [facility], [address], [place], [place] [address]   Blood Culture (routine x 2)     Status: None (Preliminary result)   Collection Time: 10/01/23  4:27 PM   Specimen: BLOOD  Result Value Ref Range Status   Specimen Description   Final    BLOOD BLOOD LEFT ARM Performed at [facility], [address] [place], [place] [address]    Special Requests   Final    BOTTLES DRAWN AEROBIC AND ANAEROBIC Blood Culture adequate volume Performed at [facility], [address] [place], [place] [address]    Culture   Final    NO GROWTH 3 DAYS Performed at [facility], [address], [place], [place] [address]    Report Status PENDING   Incomplete  Respiratory (~20 pathogens) panel by PCR     Status: Abnormal   Collection Time: 10/03/23  9:35 AM   Specimen: Nasopharyngeal Swab; Respiratory  Result Value Ref Range Status   Adenovirus NOT DETECTED NOT DETECTED Final   Coronavirus 229E NOT DETECTED NOT DETECTED Final    Comment: (NOTE) The Coronavirus on the Respiratory Panel, DOES NOT test for the novel  Coronavirus (2019 nCoV)    Coronavirus HKU1 NOT DETECTED NOT DETECTED Final   Coronavirus NL63 NOT DETECTED NOT DETECTED Final   Coronavirus OC43 NOT DETECTED NOT DETECTED Final   Metapneumovirus DETECTED (A) NOT DETECTED Final   Rhinovirus / Enterovirus NOT DETECTED NOT DETECTED Final   Influenza A NOT DETECTED  NOT DETECTED Final   Influenza B NOT DETECTED NOT DETECTED Final   Parainfluenza Virus 1 NOT DETECTED NOT DETECTED Final   Parainfluenza Virus 2 NOT DETECTED NOT DETECTED Final   Parainfluenza Virus 3 NOT DETECTED NOT DETECTED Final   Parainfluenza Virus 4 NOT DETECTED NOT DETECTED Final   Respiratory Syncytial Virus NOT DETECTED NOT DETECTED Final   Bordetella pertussis NOT DETECTED NOT DETECTED Final   Bordetella Parapertussis NOT DETECTED NOT DETECTED Final   Chlamydophila pneumoniae NOT DETECTED NOT DETECTED Final   Mycoplasma pneumoniae NOT DETECTED NOT DETECTED Final    Comment: Performed at [facility], [address], [place], [place] [address]     Labs: Basic Metabolic Panel: Recent Labs  Lab 10/01/23 1447 10/01/23 2115 10/02/23 0442 10/03/23 0501 10/04/23 0505  NA 136  --  136 138 137  K 3.9  --  3.9 3.9 3.7  CL 101  --  102 103 103  CO2 28  --  30 26 28   GLUCOSE 114*  --  91 101* 97  BUN 16  --  17 19 17   CREATININE 0.69  --  0.79 0.69 0.60*  CALCIUM 7.8*  --  7.5* 7.8* 7.7*  MG  --  2.3 2.0 1.9 1.9  PHOS  --  4.7* 4.1  --   --    Liver Function Tests: Recent Labs  Lab 10/01/23 1447 10/02/23 0442 10/03/23 0501 10/04/23 0505  AST 97* 60* 76* 77*  ALT 85* 61* 67* 70*   ALKPHOS 80 63 75 82  BILITOT 0.8 0.4 0.5 0.5  PROT 5.1* 4.1* 4.4* 4.5*  ALBUMIN 1.6* <1.5* <1.5* <1.5*   CBC: Recent Labs  Lab 09/28/23 0731 10/01/23 1447 10/02/23 0442 10/03/23 0501 10/04/23 0505  WBC 8.3 18.1* 12.3* 9.8 9.1  NEUTROABS 5.7 15.9*  --   --   --   HGB 15.7 16.4 14.4 14.9 15.0  HCT 46.9 49.8 42.7 45.4 45.6  MCV 93.4 94.1 94.3 95.2 95.0  PLT 184 184 157 167 195   CBG: No results for input(s): "GLUCAP" in the last 168 hours. Hgb A1c No results for input(s): "HGBA1C" in the last 72 hours. Lipid Profile No results for input(s): "CHOL", "HDL", "LDLCALC", "TRIG", "CHOLHDL", "LDLDIRECT" in the last 72 hours. Thyroid function studies Recent Labs    10/01/23 1448  TSH 2.835   Urinalysis    Component Value Date/Time   COLORURINE AMBER (A) 10/01/2023 2345   APPEARANCEUR HAZY (A) 10/01/2023 2345   APPEARANCEUR Clear 01/09/2023 1132   LABSPEC >1.046 (H) 10/01/2023 2345   PHURINE 5.0 10/01/2023 2345   GLUCOSEU NEGATIVE 10/01/2023 2345   HGBUR NEGATIVE 10/01/2023 2345   BILIRUBINUR NEGATIVE 10/01/2023 2345   BILIRUBINUR Negative 01/09/2023 1132   KETONESUR NEGATIVE 10/01/2023 2345   PROTEINUR >=300 (A) 10/01/2023 2345   UROBILINOGEN 0.2 04/02/2015 0837   NITRITE NEGATIVE 10/01/2023 2345   LEUKOCYTESUR NEGATIVE 10/01/2023 2345    FURTHER DISCHARGE INSTRUCTIONS:   Get Medicines reviewed and adjusted: Please take all your medications with you for your next visit with your Primary MD   Laboratory/radiological data: Please request your Primary MD to go over all hospital tests and procedure/radiological results at the follow up, please ask your Primary MD to get all Hospital records sent to his/her office.   In some cases, they will be blood work, cultures and biopsy results pending at the time of your discharge. Please request that your primary care M.D. goes through all the records of  your hospital data and follows up on these results.   Also Note the  following: If you experience worsening of your admission symptoms, develop shortness of breath, life threatening emergency, suicidal or homicidal thoughts you must seek medical attention immediately by calling 911 or calling your MD immediately  if symptoms less severe.   You must read complete instructions/literature along with all the possible adverse reactions/side effects for all the Medicines you take and that have been prescribed to you. Take any new Medicines after you have completely understood and accpet all the possible adverse reactions/side effects.    Do not drive when taking Pain medications or sleeping medications (Benzodaizepines)   Do not take more than prescribed Pain, Sleep and Anxiety Medications. It is not advisable to combine anxiety,sleep and pain medications without talking with your primary care practitioner   Special Instructions: If you have smoked or chewed Tobacco  in the last 2 yrs please stop smoking, stop any regular Alcohol  and or any Recreational drug use.   Wear Seat belts while driving.   Please note: You were cared for by a hospitalist during your hospital stay. Once you are discharged, your primary care physician will handle any further medical issues. Please note that NO REFILLS for any discharge medications will be authorized once you are discharged, as it is imperative that you return to your primary care physician (or establish a relationship with a primary care physician if you do not have one) for your post hospital discharge needs so that they can reassess your need for medications and monitor your lab values.  Time coordinating discharge: 35 minutes  SIGNED:  Kathlen Para, MD, PhD 10/04/2023, 1:54 PM

## 2023-10-05 ENCOUNTER — Telehealth (HOSPITAL_BASED_OUTPATIENT_CLINIC_OR_DEPARTMENT_OTHER): Payer: Self-pay

## 2023-10-05 NOTE — Telephone Encounter (Signed)
 Psychiatric nurse and this is on your desk!

## 2023-10-05 NOTE — Telephone Encounter (Signed)
 Please advise  Copied from CRM [id number]. Topic: Clinical - Medical Advice >> Oct 05, 2023  9:32 AM [name] wrote: Reason for CRM: Recently had an appointment with Dr. Villa Greaser that adjusted the CPAP machine, however his spouse [name] stated the CPAP causes bubbling of air when using the CPAP machine. [name] stated he is also diagnosed with amyloid. Patient recently visited the hospital on 4/16 for pneumonia. [name] wants to know if this is normal or should they adjust the CPAP machine's settings. Please call patient back.

## 2023-10-05 NOTE — Telephone Encounter (Signed)
 Download shows excellent control of sleep apnea per [name] decrease the humidity on machine if no improvement then they will call us . Walked Joseph [name] how to decrease on machine

## 2023-10-05 NOTE — Telephone Encounter (Signed)
 CPAP download please  Bubbling is probably too much condensation in the tubing , need to turn down the humidity level.

## 2023-10-06 LAB — CULTURE, BLOOD (ROUTINE X 2)
Culture: NO GROWTH
Culture: NO GROWTH
Special Requests: ADEQUATE
Special Requests: ADEQUATE

## 2023-10-09 ENCOUNTER — Encounter (HOSPITAL_COMMUNITY): Payer: Self-pay | Admitting: Hematology

## 2023-10-09 ENCOUNTER — Inpatient Hospital Stay: Attending: Internal Medicine | Admitting: Hematology

## 2023-10-09 VITALS — BP 91/64 | HR 77 | Temp 97.6°F | Resp 17 | Wt 269.3 lb

## 2023-10-09 DIAGNOSIS — C9000 Multiple myeloma not having achieved remission: Secondary | ICD-10-CM

## 2023-10-09 DIAGNOSIS — D472 Monoclonal gammopathy: Secondary | ICD-10-CM | POA: Diagnosis not present

## 2023-10-09 DIAGNOSIS — E8581 Light chain (AL) amyloidosis: Secondary | ICD-10-CM | POA: Diagnosis not present
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
## 2023-10-11 ENCOUNTER — Encounter: Payer: Self-pay | Admitting: Hematology

## 2023-10-11 DIAGNOSIS — C9000 Multiple myeloma not having achieved remission: Secondary | ICD-10-CM | POA: Insufficient documentation

## 2023-10-11 DIAGNOSIS — E8581 Light chain (AL) amyloidosis: Secondary | ICD-10-CM | POA: Insufficient documentation

## 2023-10-11 MED ORDER — DEXAMETHASONE 4 MG PO TABS: 20.0000 mg | ORAL_TABLET | Freq: Every day | ORAL | 0 refills | Status: AC

## 2023-10-11 NOTE — Progress Notes (Signed)
 START ON PATHWAY REGIMEN - Multiple Myeloma and Other Plasma Cell Dyscrasias     Cycles 1 and 2: A cycle is every 28 days:     Cyclophosphamide      Dexamethasone       Daratumumab and hyaluronidase-fihj      Bortezomib    Cycles 3 through 6: A cycle is every 28 days:     Cyclophosphamide      Dexamethasone       Dexamethasone       Daratumumab and hyaluronidase-fihj      Bortezomib    Cycles 7 and beyond (up to 2 years): A cycle is every 28 days:     Daratumumab and hyaluronidase-fihj   **Always confirm dose/schedule in your pharmacy ordering system**  Patient Characteristics: Primary AL Amyloidosis, First Line, Transplant Eligible Disease Classification: Primary AL Amyloidosis Line of therapy: First Line Is Patient Eligible for Transplant<= Transplant Eligible Intent of Therapy: Non-Curative / Palliative Intent, Discussed with Patient

## 2023-10-12 ENCOUNTER — Other Ambulatory Visit: Payer: Self-pay

## 2023-10-12 ENCOUNTER — Encounter (HOSPITAL_COMMUNITY): Payer: Self-pay | Admitting: Hematology

## 2023-10-13 DIAGNOSIS — I951 Orthostatic hypotension: Secondary | ICD-10-CM | POA: Diagnosis not present

## 2023-10-13 DIAGNOSIS — N049 Nephrotic syndrome with unspecified morphologic changes: Secondary | ICD-10-CM | POA: Diagnosis not present

## 2023-10-16 ENCOUNTER — Ambulatory Visit (HOSPITAL_COMMUNITY)
Admission: RE | Admit: 2023-10-16 | Discharge: 2023-10-16 | Disposition: A | Discharge status: Home / Self Care | Source: Ambulatory Visit | Attending: Cardiology | Admitting: Cardiology

## 2023-10-16 VITALS — BP 90/60 | HR 68 | Wt 276.2 lb

## 2023-10-16 DIAGNOSIS — Z7902 Long term (current) use of antithrombotics/antiplatelets: Secondary | ICD-10-CM | POA: Diagnosis not present

## 2023-10-16 DIAGNOSIS — N049 Nephrotic syndrome with unspecified morphologic changes: Secondary | ICD-10-CM | POA: Diagnosis not present

## 2023-10-16 DIAGNOSIS — D472 Monoclonal gammopathy: Secondary | ICD-10-CM | POA: Diagnosis present

## 2023-10-16 DIAGNOSIS — Z8673 Personal history of transient ischemic attack (TIA), and cerebral infarction without residual deficits: Secondary | ICD-10-CM | POA: Diagnosis not present

## 2023-10-16 DIAGNOSIS — I10 Essential (primary) hypertension: Secondary | ICD-10-CM | POA: Diagnosis not present

## 2023-10-16 DIAGNOSIS — I2510 Atherosclerotic heart disease of native coronary artery without angina pectoris: Secondary | ICD-10-CM | POA: Diagnosis not present

## 2023-10-16 DIAGNOSIS — I43 Cardiomyopathy in diseases classified elsewhere: Secondary | ICD-10-CM | POA: Insufficient documentation

## 2023-10-16 DIAGNOSIS — E8581 Light chain (AL) amyloidosis: Secondary | ICD-10-CM | POA: Diagnosis not present

## 2023-10-16 DIAGNOSIS — E854 Organ-limited amyloidosis: Secondary | ICD-10-CM | POA: Diagnosis not present

## 2023-10-16 DIAGNOSIS — R809 Proteinuria, unspecified: Secondary | ICD-10-CM | POA: Insufficient documentation

## 2023-10-16 DIAGNOSIS — G4730 Sleep apnea, unspecified: Secondary | ICD-10-CM | POA: Diagnosis not present

## 2023-10-16 DIAGNOSIS — I951 Orthostatic hypotension: Secondary | ICD-10-CM | POA: Insufficient documentation

## 2023-10-16 DIAGNOSIS — I639 Cerebral infarction, unspecified: Secondary | ICD-10-CM | POA: Diagnosis not present

## 2023-10-16 DIAGNOSIS — Z955 Presence of coronary angioplasty implant and graft: Secondary | ICD-10-CM | POA: Insufficient documentation

## 2023-10-16 DIAGNOSIS — Z7982 Long term (current) use of aspirin: Secondary | ICD-10-CM | POA: Diagnosis not present

## 2023-10-16 DIAGNOSIS — G629 Polyneuropathy, unspecified: Secondary | ICD-10-CM | POA: Insufficient documentation

## 2023-10-16 DIAGNOSIS — I480 Paroxysmal atrial fibrillation: Secondary | ICD-10-CM | POA: Diagnosis not present

## 2023-10-16 DIAGNOSIS — Z951 Presence of aortocoronary bypass graft: Secondary | ICD-10-CM | POA: Diagnosis not present

## 2023-10-16 DIAGNOSIS — I509 Heart failure, unspecified: Secondary | ICD-10-CM | POA: Diagnosis present

## 2023-10-16 MED ORDER — FLUDROCORTISONE ACETATE 0.1 MG PO TABS: 0.1000 mg | ORAL_TABLET | Freq: Every day | ORAL | 11 refills | Status: DC

## 2023-10-16 NOTE — Progress Notes (Signed)
   ADVANCED HEART FAILURE FOLLOW UP CLINIC NOTE  Referring Physician: Leesa Pulling, MD  Primary Care: Leesa Pulling, MD Primary Cardiologist:  HPI: Joseph [name]. is a 58 y.o. 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      Patient has a fairly complex medical history.  Notable history of coronary artery disease with PCI to the LAD in 2015, moderate RCA stenosis at the time.  He established care with [facility] cardiology in 2023 for HCM and was trialed on mavacamten for worsening obstructive symptoms.  he reported that the medication interfere with his pain medication and so he was referred to [facility] clinic for septal myectomy.  Procedure was complicated by postoperative atrial fibrillation, likely TIA/CVA, but otherwise good result with improvement in gradient.  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  Mass spectroscopy was not able to identify the dominant amyloid protein.   Since that time he was admitted in December for CVA and was placed on aspirin /Plavix .  Additionally, he was admitted in March for symptomatic hypotension.  All blood pressure medications were held with improvement.  He was also found to have new nephrotic syndrome and follow-up was established with nephrology.  Since that time he had a bone marrow biopsy on 09/28/23 that showed small foci of amyloid deposits and 10% plasma cell burden.  Patient has been arranged to start on Dara-CyBorD, though planning to hold Velcade given neuropathy.     SUBJECTIVE:  PMH, current medications, allergies, social history, and family history reviewed in epic.  PHYSICAL EXAM: There were no vitals filed for this visit. GENERAL: Well nourished and in no apparent distress at  rest.  PULM:  Normal work of breathing, clear to auscultation bilaterally. Respirations are unlabored.  CARDIAC:  JVP: ***         Normal rate with regular rhythm. No murmurs, rubs or gallops.  *** edema. Warm and well perfused extremities. ABDOMEN: Soft, non-tender, non-distended. NEUROLOGIC: Patient is oriented x3 with no focal or lateralizing neurologic deficits.    DATA REVIEW  ECG: ***    ECHO: ***   CATH: ***   Heart failure review: - Classification: {HFCLASS:30917} - Etiology: {Cardiomyopathy:30918} - NYHA Class:  - Volume status: {volumechf:30919} - ACEi/ARB/ARNI: {HF:30752} - Aldosterone antagonist: {HF:30752} - Beta-blocker: {HF:30752} - Digoxin: {HF:30752} - Hydralazine /Nitrates: {HF:30752} - SGLT2i: {HF:30752} - GLP-1: {GLP:30906} - Advanced therapies: {Advancedtherapies:30916} - ICD: {ICD:30901}  ASSESSMENT & PLAN:  AL cardiac amyloid:  Nephrotic syndrome:    Follow up in ***  Arta Lark, MD Advanced Heart Failure Mechanical Circulatory Support 10/16/23

## 2023-10-16 NOTE — Patient Instructions (Addendum)
 START Florinef .1mg  daily.  You can get the ace bandages at your local pharmacy along with Kerlex to see if that helps with your legs   Your physician has requested that you have an echocardiogram. Echocardiography is a painless test that uses sound waves to create images of your heart. It provides your doctor with information about the size and shape of your heart and how well your heart's chambers and valves are working. This procedure takes approximately one hour. There are no restrictions for this procedure. Please do NOT wear cologne, perfume, aftershave, or lotions (deodorant is allowed). Please arrive 15 minutes prior to your appointment time.  Please note: We ask at that you not bring children with you during ultrasound (echo/ vascular) testing. Due to room size and safety concerns, children are not allowed in the ultrasound rooms during exams. Our front office staff cannot provide observation of children in our lobby area while testing is being conducted. An adult accompanying a patient to their appointment will only be allowed in the ultrasound room at the discretion of the ultrasound technician under special circumstances. We apologize for any inconvenience.  Your physician recommends that you schedule a follow-up appointment in: 1 month with an echocardiogram.  If you have any questions or concerns before your next appointment please send us  a message through [place] or call our office at [phone].    TO LEAVE A MESSAGE FOR THE NURSE SELECT OPTION 2, PLEASE LEAVE A MESSAGE INCLUDING: YOUR NAME DATE OF BIRTH CALL BACK NUMBER REASON FOR CALL**this is important as we prioritize the call backs  YOU WILL RECEIVE A CALL BACK THE SAME DAY AS LONG AS YOU CALL BEFORE 4:00 PM  At the [facility], you and your health needs are our priority. As part of our continuing mission to provide you with exceptional heart care, we have created designated Provider Care Teams. These  Care Teams include your primary Cardiologist (physician) and Advanced Practice Providers (APPs- Physician Assistants and Nurse Practitioners) who all work together to provide you with the care you need, when you need it.   You may see any of the following providers on your designated Care Team at your next follow up: Dr Jules Oar Dr Peder Bourdon Dr. Alwin Baars Dr. Arta Lark Amy Marijane Shoulders, NP Ruddy Corral, [place] [facility] [place], [place] Dennise Fitz, NP [place] Lee, NP Shawnee Dellen, NP Luster Salters, PharmD Bevely Brush, PharmD   Please be sure to bring in all your medications bottles to every appointment.    Thank you for choosing [facility]

## 2023-10-19 ENCOUNTER — Telehealth: Payer: Self-pay | Admitting: Hematology

## 2023-10-19 ENCOUNTER — Encounter: Payer: Self-pay | Admitting: Hematology

## 2023-10-19 DIAGNOSIS — I43 Cardiomyopathy in diseases classified elsewhere: Secondary | ICD-10-CM | POA: Diagnosis not present

## 2023-10-19 DIAGNOSIS — E8581 Light chain (AL) amyloidosis: Secondary | ICD-10-CM | POA: Diagnosis not present

## 2023-10-19 DIAGNOSIS — E854 Organ-limited amyloidosis: Secondary | ICD-10-CM | POA: Diagnosis not present

## 2023-10-19 DIAGNOSIS — N049 Nephrotic syndrome with unspecified morphologic changes: Secondary | ICD-10-CM | POA: Diagnosis not present

## 2023-10-19 DIAGNOSIS — I951 Orthostatic hypotension: Secondary | ICD-10-CM | POA: Diagnosis not present

## 2023-10-19 NOTE — Progress Notes (Signed)
 Pharmacist Chemotherapy Monitoring - Initial Assessment    Anticipated start date: 10/26/23   The following has been reviewed per standard work regarding the patient's treatment regimen: The patient's diagnosis, treatment plan and drug doses, and organ/hematologic function Lab orders and baseline tests specific to treatment regimen  The treatment plan start date, drug sequencing, and pre-medications Prior authorization status  Patient's documented medication list, including drug-drug interaction screen and prescriptions for anti-emetics and supportive care specific to the treatment regimen The drug concentrations, fluid compatibility, administration routes, and timing of the medications to be used The patient's access for treatment and lifetime cumulative dose history, if applicable  The patient's medication allergies and previous infusion related reactions, if applicable   Changes made to treatment plan:  N/A  Follow up needed:  Home meds to be released; T&S and RPB phenotype  Kara Orts, PharmD, MBA

## 2023-10-19 NOTE — Telephone Encounter (Signed)
 Spoke with patient patient wife confirming upcoming appointment

## 2023-10-20 ENCOUNTER — Telehealth: Payer: Self-pay | Admitting: Cardiology

## 2023-10-20 NOTE — Telephone Encounter (Signed)
 Outpatient service line:  CC: Weight gain, peripheral edema  Patient's wife is calling reporting that patient's had approximately 10 pound weight gain and having significant peripheral edema, sounds like 2+ edema in his legs and some bilateral hand swelling.  However denies no chest pain or shortness of breath.  His orthostatic hypotension seems to be doing okay with the up titration of midodrine .  The last office note recommended midodrine  15 mg 3 times daily, wife reports that she was instructed that she could take up to 20 mg and up to 4 times a day but for right now they are doing 20 mg 3 times daily.  Also prescribed Florinef  however she says that the nephrologist recommended against this so they have stopped this.  Patient is tenuous and I had a very low threshold to send him to the emergency room given soft blood pressure 90/60 and his comorbid conditions and need for diuresis however wife would like to try restarting his Lasix  and close outpatient follow-up.  I recommended starting his Lasix  40 mg daily for the time being as long as he tolerates.  Will route message to primary cardiologist for further direction of Florinef  and midodrine  dosage and further diuresis.

## 2023-10-24 NOTE — Progress Notes (Signed)
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 PLAN:  -Discussed lab results on 10/25/2023 in detail with patient. CBC showed WBC of 7.5K, hemoglobin of 15.2, and platelets of 191K. 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  -Albumin  level noted to be less than 1.5 mid-April -creatinine not increased based on CMP mid-April -discussed that patient releases 8-10 grams from leaky kidneys daily at this time -discussed that if his kidneys are less leaky and his albumin  improves, his blood pressure may improve, along with swelling  -discussed that his treatmnet regimen will involve IV Cytoxan , oral steroids, and daratumumab  injection into the skin -discussed details of treatment regimen with  patient and his family members -patient is scheduled to receive treatment tomorrow -discussed option to switch cytoxan  to tablets if  his veins are unable to be accessed -discussed that steroids from treatment can cause leg swelling, though the dose is cut by 50% -discussed that some leakiness can reverse with steroids -discussed that there are 3 levels are response: Hematologic response is generally fairly quick Renal response defined by changes in leakage of protein Cardiac functioning/recovery -discussed that it typically takes 4-6 cycles of treatment to knock out abnormal cells making abnormal protein. Discussed that the second element is seeing how quickly his heart and kidney then respond  -Discussed that secondary response pertains to how quickly his kidney improves from the standpoint of not being leaky. Discussed that secondary response typically takes longer, potentially at least 3 months.  -discussed that there may be a role for 24 hour urine testing after 3 months -discussed that we would have a better sense from a prognosis standpoint in 3-6 months. Discussed that if his kidneys become less leaky at the 64-month mark, it would make a big difference overall.  -discussed that maintenance treatment would depend on how well someone tolerates treatment. Discussed that maintenance could be daratumumab  once a month potentially  -discussed that if his albumin  improves and he continues to have issues with his blood pressure, we can revisit consideration of Fludrocortisone  at that time -discussed details of biological processes/mechanisms involved with Florinef  -discussed that there are several factors that could be playing into his low blood pressure, such as certain medications including Alfuzosin , pain medication, sleep apnea, cardiac amyloid, etc.  -continue midodrine  managed by nephrologist -recommend minimizing other medications causing him to be orthostatic -recommend patient to move around before standing up so that there is a less likelihood of dizziness when standing -educated patient that amyloid in the heart can  make the heart more stiff which could play into blood pressure -recommend continuing to wear compression socks or ace wrap to keep blood from pooling in the legs and help with circulation to the brain -discussed option of wearing a series of velcro bands on lower extremities, which can be useful given that the amount of leg swelling can change -okay to use Sequential Compression therapy to prevent clots though it would not provide continuous compression. Would preferably recommend continuous compression options to prevent pooling -recommend using an abdominal binder to reduce venous pooling in the abdomen -okay to use Boost nutritional supplement in addition to meals -there does appear to be mild swelling from incision site of bone marrow biopsy -answered all of patient's and his family member's questions in detail  FOLLOW-UP: Per integrated scheduling  The total time spent in the appointment was 40 minutes* .  All of the patient's questions were answered with apparent satisfaction. The patient knows to call the clinic with any problems, questions or concerns.   Jacquelyn Matt MD MS AAHIVMS [facility] Hematology/Oncology Physician [facility]  .*Total Encounter Time as defined by the Centers for Medicare and Medicaid Services includes, in addition to the face-to-face time of a patient visit (documented in the note above) non-face-to-face time: obtaining and reviewing outside history, ordering and reviewing medications, tests or procedures, care coordination (communications with other health care professionals or caregivers) and documentation in the medical record.    I,Mitra Faeizi,acting as a Neurosurgeon for Jacquelyn Matt, MD.,have documented all relevant documentation on the behalf of Jacquelyn Matt, MD,as directed by  Jacquelyn Matt, MD while in the presence of  Jacquelyn Matt, MD.  .I have reviewed the above documentation for accuracy and completeness, and I agree with the above. .Bessie Livingood  Kishore Jamarri Vuncannon MD

## 2023-10-25 ENCOUNTER — Inpatient Hospital Stay: Attending: Internal Medicine

## 2023-10-25 ENCOUNTER — Inpatient Hospital Stay (HOSPITAL_BASED_OUTPATIENT_CLINIC_OR_DEPARTMENT_OTHER): Admitting: Hematology

## 2023-10-25 ENCOUNTER — Inpatient Hospital Stay (HOSPITAL_BASED_OUTPATIENT_CLINIC_OR_DEPARTMENT_OTHER)

## 2023-10-25 ENCOUNTER — Inpatient Hospital Stay

## 2023-10-25 VITALS — BP 92/65 | HR 74 | Temp 97.4°F | Resp 18 | Ht 72.0 in | Wt 278.2 lb

## 2023-10-25 DIAGNOSIS — Z7902 Long term (current) use of antithrombotics/antiplatelets: Secondary | ICD-10-CM | POA: Insufficient documentation

## 2023-10-25 DIAGNOSIS — Z5111 Encounter for antineoplastic chemotherapy: Secondary | ICD-10-CM | POA: Insufficient documentation

## 2023-10-25 DIAGNOSIS — Z79899 Other long term (current) drug therapy: Secondary | ICD-10-CM | POA: Diagnosis not present

## 2023-10-25 DIAGNOSIS — C9000 Multiple myeloma not having achieved remission: Secondary | ICD-10-CM

## 2023-10-25 DIAGNOSIS — Z87891 Personal history of nicotine dependence: Secondary | ICD-10-CM | POA: Diagnosis not present

## 2023-10-25 DIAGNOSIS — Z5112 Encounter for antineoplastic immunotherapy: Secondary | ICD-10-CM | POA: Diagnosis not present

## 2023-10-25 DIAGNOSIS — E8581 Light chain (AL) amyloidosis: Secondary | ICD-10-CM | POA: Diagnosis not present

## 2023-10-25 DIAGNOSIS — Z7982 Long term (current) use of aspirin: Secondary | ICD-10-CM | POA: Insufficient documentation

## 2023-10-25 DIAGNOSIS — Z7952 Long term (current) use of systemic steroids: Secondary | ICD-10-CM | POA: Insufficient documentation

## 2023-10-25 DIAGNOSIS — Z79624 Long term (current) use of inhibitors of nucleotide synthesis: Secondary | ICD-10-CM | POA: Insufficient documentation

## 2023-10-25 LAB — CBC WITH DIFFERENTIAL (CANCER CENTER ONLY)
Abs Immature Granulocytes: 0.01 10*3/uL (ref 0.00–0.07)
Basophils Absolute: 0 10*3/uL (ref 0.0–0.1)
Basophils Relative: 0 %
Eosinophils Absolute: 0 10*3/uL (ref 0.0–0.5)
Eosinophils Relative: 1 %
HCT: 43.9 % (ref 39.0–52.0)
Hemoglobin: 15.2 g/dL (ref 13.0–17.0)
Immature Granulocytes: 0 %
Lymphocytes Relative: 26 %
Lymphs Abs: 2 10*3/uL (ref 0.7–4.0)
MCH: 31.5 pg (ref 26.0–34.0)
MCHC: 34.6 g/dL (ref 30.0–36.0)
MCV: 91.1 fL (ref 80.0–100.0)
Monocytes Absolute: 0.5 10*3/uL (ref 0.1–1.0)
Monocytes Relative: 7 %
Neutro Abs: 4.9 10*3/uL (ref 1.7–7.7)
Neutrophils Relative %: 66 %
Platelet Count: 191 10*3/uL (ref 150–400)
RBC: 4.82 MIL/uL (ref 4.22–5.81)
RDW: 14.8 % (ref 11.5–15.5)
WBC Count: 7.5 10*3/uL (ref 4.0–10.5)
nRBC: 0 % (ref 0.0–0.2)

## 2023-10-25 LAB — CMP (CANCER CENTER ONLY)
ALT: 77 U/L — ABNORMAL HIGH (ref 0–44)
AST: 77 U/L — ABNORMAL HIGH (ref 15–41)
Albumin: 2 g/dL — ABNORMAL LOW (ref 3.5–5.0)
Alkaline Phosphatase: 76 U/L (ref 38–126)
Anion gap: 4 — ABNORMAL LOW (ref 5–15)
BUN: 16 mg/dL (ref 6–20)
CO2: 34 mmol/L — ABNORMAL HIGH (ref 22–32)
Calcium: 7.6 mg/dL — ABNORMAL LOW (ref 8.9–10.3)
Chloride: 102 mmol/L (ref 98–111)
Creatinine: 0.78 mg/dL (ref 0.61–1.24)
GFR, Estimated: >60 mL/min (ref >60)
Glucose, Bld: 96 mg/dL (ref 70–99)
Potassium: 4.3 mmol/L (ref 3.5–5.1)
Sodium: 140 mmol/L (ref 135–145)
Total Bilirubin: 0.3 mg/dL (ref 0.0–1.2)
Total Protein: 4.5 g/dL — ABNORMAL LOW (ref 6.5–8.1)

## 2023-10-25 LAB — PRETREATMENT RBC PHENOTYPE

## 2023-10-25 LAB — TYPE AND SCREEN
ABO/RH(D): AB POS
Antibody Screen: NEGATIVE

## 2023-10-25 MED ORDER — ACYCLOVIR 400 MG PO TABS: 400.0000 mg | ORAL_TABLET | Freq: Two times a day (BID) | ORAL | 5 refills | Status: DC

## 2023-10-25 MED ORDER — ONDANSETRON HCL 8 MG PO TABS
ORAL_TABLET | ORAL | 1 refills | Status: DC
Start: 2023-10-25 — End: 2023-12-28

## 2023-10-25 MED ORDER — PROCHLORPERAZINE MALEATE 10 MG PO TABS: 10.0000 mg | ORAL_TABLET | Freq: Four times a day (QID) | ORAL | 1 refills | Status: AC | PRN

## 2023-10-25 NOTE — Progress Notes (Signed)
 Patient seen by Dr. Kale  Vitals are not all within treatment parameters.    Pt is being tx for very low BP with oral Midodrin 4 times daily. Pt will have med on hand during infusion.   Labs reviewed: and are within treatment parameters.  Per physician team, patient is ready for treatment and there are NO modifications to the treatment plan.   Pt for first Tx on 10/26/23

## 2023-10-26 ENCOUNTER — Inpatient Hospital Stay

## 2023-10-26 ENCOUNTER — Encounter: Payer: Self-pay | Admitting: Hematology

## 2023-10-26 VITALS — BP 148/80 | HR 74 | Temp 97.8°F | Resp 18

## 2023-10-26 DIAGNOSIS — E8581 Light chain (AL) amyloidosis: Secondary | ICD-10-CM | POA: Diagnosis not present

## 2023-10-26 DIAGNOSIS — C9000 Multiple myeloma not having achieved remission: Secondary | ICD-10-CM | POA: Diagnosis not present

## 2023-10-26 DIAGNOSIS — Z87891 Personal history of nicotine dependence: Secondary | ICD-10-CM | POA: Diagnosis not present

## 2023-10-26 DIAGNOSIS — Z5112 Encounter for antineoplastic immunotherapy: Secondary | ICD-10-CM | POA: Diagnosis not present

## 2023-10-26 DIAGNOSIS — Z79899 Other long term (current) drug therapy: Secondary | ICD-10-CM | POA: Diagnosis not present

## 2023-10-26 DIAGNOSIS — Z7902 Long term (current) use of antithrombotics/antiplatelets: Secondary | ICD-10-CM | POA: Diagnosis not present

## 2023-10-26 DIAGNOSIS — Z7982 Long term (current) use of aspirin: Secondary | ICD-10-CM | POA: Diagnosis not present

## 2023-10-26 DIAGNOSIS — Z79624 Long term (current) use of inhibitors of nucleotide synthesis: Secondary | ICD-10-CM | POA: Diagnosis not present

## 2023-10-26 DIAGNOSIS — Z5111 Encounter for antineoplastic chemotherapy: Secondary | ICD-10-CM | POA: Diagnosis not present

## 2023-10-26 DIAGNOSIS — Z7952 Long term (current) use of systemic steroids: Secondary | ICD-10-CM | POA: Diagnosis not present

## 2023-10-26 MED ORDER — DEXAMETHASONE 4 MG PO TABS
20.0000 mg | ORAL_TABLET | Freq: Once | ORAL | Status: AC
  Administered 2023-10-26: 20 mg via ORAL
  Filled 2023-10-26: qty 5

## 2023-10-26 MED ORDER — DARATUMUMAB-HYALURONIDASE-FIHJ 1800-30000 MG-UT/15ML ~~LOC~~ SOLN
1800.0000 mg | Freq: Once | SUBCUTANEOUS | Status: AC
  Administered 2023-10-26: 1800 mg via SUBCUTANEOUS
  Filled 2023-10-26: qty 15

## 2023-10-26 MED ORDER — MONTELUKAST SODIUM 10 MG PO TABS
10.0000 mg | ORAL_TABLET | Freq: Once | ORAL | Status: AC
  Administered 2023-10-26: 10 mg via ORAL
  Filled 2023-10-26: qty 1

## 2023-10-26 MED ORDER — SODIUM CHLORIDE 0.9 % IV SOLN
200.0000 mg/m2 | Freq: Once | INTRAVENOUS | Status: AC
  Administered 2023-10-26: 500 mg via INTRAVENOUS
  Filled 2023-10-26: qty 25

## 2023-10-26 MED ORDER — ACETAMINOPHEN 325 MG PO TABS
650.0000 mg | ORAL_TABLET | Freq: Once | ORAL | Status: AC
Start: 2023-10-26 — End: 2023-10-26
  Administered 2023-10-26: 650 mg via ORAL
  Filled 2023-10-26: qty 2

## 2023-10-26 MED ORDER — DIPHENHYDRAMINE HCL 25 MG PO CAPS
50.0000 mg | ORAL_CAPSULE | Freq: Once | ORAL | Status: AC
  Administered 2023-10-26: 50 mg via ORAL
  Filled 2023-10-26: qty 2

## 2023-10-26 MED ORDER — PALONOSETRON HCL INJECTION 0.25 MG/5ML
0.2500 mg | Freq: Once | INTRAVENOUS | Status: AC
  Administered 2023-10-26: 0.25 mg via INTRAVENOUS
  Filled 2023-10-26: qty 5

## 2023-10-26 NOTE — Patient Instructions (Signed)
 [facility] - A DEPT OF [facility]  Discharge Instructions: Thank you for choosing [facility] to provide your oncology and hematology care.   If you have a lab appointment with the Cancer Center, please go directly to the Cancer Center and check in at the registration area.   Wear comfortable clothing and clothing appropriate for easy access to any Portacath or PICC line.   We strive to give you quality time with your provider. You may need to reschedule your appointment if you arrive late (15 or more minutes).  Arriving late affects you and other patients whose appointments are after yours.  Also, if you miss three or more appointments without notifying the office, you may be dismissed from the clinic at the provider's discretion.      For prescription refill requests, have your pharmacy contact our office and allow 72 hours for refills to be completed.    Today you received the following chemotherapy and/or immunotherapy agents darzalex faspro and cytoxan       To help prevent nausea and vomiting after your treatment, we encourage you to take your nausea medication as directed.  BELOW ARE SYMPTOMS THAT SHOULD BE REPORTED IMMEDIATELY: *FEVER GREATER THAN 100.4 F (38 C) OR HIGHER *CHILLS OR SWEATING *NAUSEA AND VOMITING THAT IS NOT CONTROLLED WITH YOUR NAUSEA MEDICATION *UNUSUAL SHORTNESS OF BREATH *UNUSUAL BRUISING OR BLEEDING *URINARY PROBLEMS (pain or burning when urinating, or frequent urination) *BOWEL PROBLEMS (unusual diarrhea, constipation, pain near the anus) TENDERNESS IN MOUTH AND THROAT WITH OR WITHOUT PRESENCE OF ULCERS (sore throat, sores in mouth, or a toothache) UNUSUAL RASH, SWELLING OR PAIN  UNUSUAL VAGINAL DISCHARGE OR ITCHING   Items with * indicate a potential emergency and should be followed up as soon as possible or go to the Emergency Department if any problems should occur.  Please show the CHEMOTHERAPY ALERT CARD  or IMMUNOTHERAPY ALERT CARD at check-in to the Emergency Department and triage nurse.  Should you have questions after your visit or need to cancel or reschedule your appointment, please contact [facility] - [facility] DE[facility]  Dept: [phone]  and follow the prompts.  Office hours are 8:00 a.m. to 4:30 p.m. Monday - Friday. Please note that voicemails left after 4:00 p.m. may not be returned until the following business day.  We are closed weekends and major holidays. You have access to a nurse at all times for urgent questions. Please call the main number to the clinic Dept: [phone] and follow the prompts.   For any non-urgent questions, you may also contact your provider using MyChart. We now offer e-Visits for anyone 22 and older to request care online for non-urgent symptoms. For details visit [email or web address].   Also download the MyChart app! Go to the app store, search "MyChart", open the app, select , and log in with your MyChart username and password.

## 2023-10-26 NOTE — Progress Notes (Signed)
 Received call from patient's spouse after receiving my card per my request.  Introduced myself as Dance movement psychotherapist and to offer available resources. Screened for [employer] and they do not qualify to apply.   Discussed deductible/OOP for insurance. She is familiar with plan details and EOBs. Advised if there is any available copay assistance for diagnosis or specific treatment drugs, a third party we work with [employer] will reach out. Provided there number at [phone]. She verbalized understanding.   Discussed the [name] as patient resides in [place]. Advised she may obtain an application from registration to complete and return.  They have my card for any additional financial questions or concerns.

## 2023-11-01 ENCOUNTER — Encounter: Payer: Self-pay | Admitting: Hematology
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
## 2023-11-02 DIAGNOSIS — E039 Hypothyroidism, unspecified: Secondary | ICD-10-CM | POA: Diagnosis not present

## 2023-11-03 ENCOUNTER — Inpatient Hospital Stay

## 2023-11-03 ENCOUNTER — Inpatient Hospital Stay (HOSPITAL_BASED_OUTPATIENT_CLINIC_OR_DEPARTMENT_OTHER): Admitting: Hematology

## 2023-11-03 VITALS — BP 124/78 | HR 64 | Temp 97.8°F | Resp 16

## 2023-11-03 VITALS — BP 76/47 | HR 18 | Temp 97.0°F | Wt 280.2 lb

## 2023-11-03 DIAGNOSIS — E039 Hypothyroidism, unspecified: Secondary | ICD-10-CM | POA: Diagnosis not present

## 2023-11-03 DIAGNOSIS — E8581 Light chain (AL) amyloidosis: Secondary | ICD-10-CM

## 2023-11-03 DIAGNOSIS — C9000 Multiple myeloma not having achieved remission: Secondary | ICD-10-CM | POA: Diagnosis not present

## 2023-11-03 DIAGNOSIS — Z79624 Long term (current) use of inhibitors of nucleotide synthesis: Secondary | ICD-10-CM | POA: Diagnosis not present

## 2023-11-03 DIAGNOSIS — N049 Nephrotic syndrome with unspecified morphologic changes: Secondary | ICD-10-CM

## 2023-11-03 DIAGNOSIS — Z5111 Encounter for antineoplastic chemotherapy: Secondary | ICD-10-CM | POA: Diagnosis not present

## 2023-11-03 DIAGNOSIS — E8809 Other disorders of plasma-protein metabolism, not elsewhere classified: Secondary | ICD-10-CM

## 2023-11-03 DIAGNOSIS — E1122 Type 2 diabetes mellitus with diabetic chronic kidney disease: Secondary | ICD-10-CM | POA: Diagnosis not present

## 2023-11-03 DIAGNOSIS — Z79899 Other long term (current) drug therapy: Secondary | ICD-10-CM | POA: Diagnosis not present

## 2023-11-03 DIAGNOSIS — Z87891 Personal history of nicotine dependence: Secondary | ICD-10-CM | POA: Diagnosis not present

## 2023-11-03 DIAGNOSIS — Z7902 Long term (current) use of antithrombotics/antiplatelets: Secondary | ICD-10-CM | POA: Diagnosis not present

## 2023-11-03 DIAGNOSIS — E782 Mixed hyperlipidemia: Secondary | ICD-10-CM | POA: Diagnosis not present

## 2023-11-03 DIAGNOSIS — Z7952 Long term (current) use of systemic steroids: Secondary | ICD-10-CM | POA: Diagnosis not present

## 2023-11-03 DIAGNOSIS — I959 Hypotension, unspecified: Secondary | ICD-10-CM

## 2023-11-03 DIAGNOSIS — D696 Thrombocytopenia, unspecified: Secondary | ICD-10-CM | POA: Diagnosis not present

## 2023-11-03 DIAGNOSIS — Z7982 Long term (current) use of aspirin: Secondary | ICD-10-CM | POA: Diagnosis not present

## 2023-11-03 DIAGNOSIS — Z5112 Encounter for antineoplastic immunotherapy: Secondary | ICD-10-CM | POA: Diagnosis not present

## 2023-11-03 LAB — CMP (CANCER CENTER ONLY)
ALT: 76 U/L — ABNORMAL HIGH (ref 0–44)
AST: 62 U/L — ABNORMAL HIGH (ref 15–41)
Albumin: 1.9 g/dL — ABNORMAL LOW (ref 3.5–5.0)
Alkaline Phosphatase: 67 U/L (ref 38–126)
Anion gap: 3 — ABNORMAL LOW (ref 5–15)
BUN: 20 mg/dL (ref 6–20)
CO2: 31 mmol/L (ref 22–32)
Calcium: 7.4 mg/dL — ABNORMAL LOW (ref 8.9–10.3)
Chloride: 106 mmol/L (ref 98–111)
Creatinine: 0.7 mg/dL (ref 0.61–1.24)
GFR, Estimated: >60 mL/min (ref >60)
Glucose, Bld: 114 mg/dL — ABNORMAL HIGH (ref 70–99)
Potassium: 4.2 mmol/L (ref 3.5–5.1)
Sodium: 140 mmol/L (ref 135–145)
Total Bilirubin: 0.3 mg/dL (ref 0.0–1.2)
Total Protein: 4 g/dL — ABNORMAL LOW (ref 6.5–8.1)

## 2023-11-03 LAB — CBC WITH DIFFERENTIAL (CANCER CENTER ONLY)
Abs Immature Granulocytes: 0.02 10*3/uL (ref 0.00–0.07)
Basophils Absolute: 0 10*3/uL (ref 0.0–0.1)
Basophils Relative: 0 %
Eosinophils Absolute: 0.1 10*3/uL (ref 0.0–0.5)
Eosinophils Relative: 1 %
HCT: 42.5 % (ref 39.0–52.0)
Hemoglobin: 14.5 g/dL (ref 13.0–17.0)
Immature Granulocytes: 0 %
Lymphocytes Relative: 21 %
Lymphs Abs: 1.8 10*3/uL (ref 0.7–4.0)
MCH: 31.7 pg (ref 26.0–34.0)
MCHC: 34.1 g/dL (ref 30.0–36.0)
MCV: 92.8 fL (ref 80.0–100.0)
Monocytes Absolute: 0.6 10*3/uL (ref 0.1–1.0)
Monocytes Relative: 7 %
Neutro Abs: 6 10*3/uL (ref 1.7–7.7)
Neutrophils Relative %: 71 %
Platelet Count: 178 10*3/uL (ref 150–400)
RBC: 4.58 MIL/uL (ref 4.22–5.81)
RDW: 15 % (ref 11.5–15.5)
WBC Count: 8.5 10*3/uL (ref 4.0–10.5)
nRBC: 0 % (ref 0.0–0.2)

## 2023-11-03 LAB — T4, FREE: Free T4: 0.82 ng/dL (ref 0.82–1.77)

## 2023-11-03 LAB — TSH: TSH: 8.91 u[IU]/mL — ABNORMAL HIGH (ref 0.450–4.500)

## 2023-11-03 MED ORDER — ACETAMINOPHEN 325 MG PO TABS
650.0000 mg | ORAL_TABLET | Freq: Once | ORAL | Status: AC
  Administered 2023-11-03: 650 mg via ORAL
  Filled 2023-11-03: qty 2

## 2023-11-03 MED ORDER — DARATUMUMAB-HYALURONIDASE-FIHJ 1800-30000 MG-UT/15ML ~~LOC~~ SOLN
1800.0000 mg | Freq: Once | SUBCUTANEOUS | Status: AC
Start: 2023-11-03 — End: 2023-11-03
  Administered 2023-11-03: 1800 mg via SUBCUTANEOUS
  Filled 2023-11-03: qty 15

## 2023-11-03 MED ORDER — ALBUMIN HUMAN 25 % IV SOLN
25.0000 g | Freq: Once | INTRAVENOUS | Status: DC
Start: 2023-11-03 — End: 2023-11-03

## 2023-11-03 MED ORDER — PALONOSETRON HCL INJECTION 0.25 MG/5ML
0.2500 mg | Freq: Once | INTRAVENOUS | Status: AC
  Administered 2023-11-03: 0.25 mg via INTRAVENOUS
  Filled 2023-11-03: qty 5

## 2023-11-03 MED ORDER — SODIUM CHLORIDE 0.9 % IV SOLN
200.0000 mg/m2 | Freq: Once | INTRAVENOUS | Status: AC
  Administered 2023-11-03: 500 mg via INTRAVENOUS
  Filled 2023-11-03: qty 25

## 2023-11-03 MED ORDER — ALBUMIN HUMAN 25 % IV SOLN
25.0000 g | Freq: Once | INTRAVENOUS | Status: AC
  Administered 2023-11-03: 25 g via INTRAVENOUS
  Filled 2023-11-03: qty 100

## 2023-11-03 MED ORDER — DIPHENHYDRAMINE HCL 25 MG PO CAPS
50.0000 mg | ORAL_CAPSULE | Freq: Once | ORAL | Status: AC
  Administered 2023-11-03: 50 mg via ORAL
  Filled 2023-11-03: qty 2

## 2023-11-03 MED ORDER — DEXAMETHASONE 4 MG PO TABS
20.0000 mg | ORAL_TABLET | Freq: Once | ORAL | Status: AC
  Administered 2023-11-03: 20 mg via ORAL
  Filled 2023-11-03: qty 5

## 2023-11-03 MED ORDER — MONTELUKAST SODIUM 10 MG PO TABS
10.0000 mg | ORAL_TABLET | Freq: Once | ORAL | Status: AC
  Administered 2023-11-03: 10 mg via ORAL
  Filled 2023-11-03: qty 1

## 2023-11-03 NOTE — Patient Instructions (Signed)
 Albumin  Injection What is this medication? ALBUMIN  (al BYOO min) treats low blood volume, which may occur with severe dehydration or bleeding. It works by increasing blood volume so your heart can pump blood to the rest of your body. It may also be used to treat low albumin  levels caused by surgery, infection, or other health conditions. Albumin  is a protein that helps your body balance the level of fluid in your blood vessels. This helps maintain a healthy blood pressure and prevents swelling or edema. This medicine may be used for other purposes; ask your health care provider or pharmacist if you have questions. COMMON BRAND NAME(S): Albuked , Albumarc, Albuminar, Albuminex , AlbuRx , Albutein , Buminate, Flexbumin , Kedbumin , Macrotec, Plasbumin , Plasbumin -20 What should I tell my care team before I take this medication? They need to know if you have any of these conditions: Heart disease Kidney disease Low red blood cell levels (anemia) An unusual or allergic reaction to albumin , other medications, foods, dyes, or preservatives Pregnant or trying to get pregnant Breastfeeding How should I use this medication? This medication is infused into a vein. It is given by your care team in a hospital or clinic setting. Talk to your care team about the use of this medication in children. While it may be given to children for selected conditions, precautions do apply. Overdosage: If you think you have taken too much of this medicine contact a poison control center or emergency room at once. NOTE: This medicine is only for you. Do not share this medicine with others. What if I miss a dose? This does not apply. What may interact with this medication? Interactions are not expected. This list may not describe all possible interactions. Give your health care provider a list of all the medicines, herbs, non-prescription drugs, or dietary supplements you use. Also tell them if you smoke, drink alcohol, or use  illegal drugs. Some items may interact with your medicine. What should I watch for while using this medication? Your condition will be monitored carefully while you are receiving this medication. This product is derived from human plasma. Talk to your care team about the risks and benefits of this medication. What side effects may I notice from receiving this medication? Side effects that you should report to your care team as soon as possible: Allergic reactions--skin rash, itching, hives, swelling of the face, lips, tongue, or throat Heart failure--shortness of breath, swelling of the ankles, feet, or hands, sudden weight gain, unusual weakness or fatigue Increase in blood pressure Shortness of breath or trouble breathing, cough, unusual weakness or fatigue, blue skin or lips Side effects that usually do not require medical attention (report these to your care team if they continue or are bothersome): Flushing Headache Nausea This list may not describe all possible side effects. Call your doctor for medical advice about side effects. You may report side effects to FDA at 1-800-FDA-1088. Where should I keep my medication? This medication is given in a hospital or clinic. It will not be stored at home. NOTE: This sheet is a summary. It may not cover all possible information. If you have questions about this medicine, talk to your doctor, pharmacist, or health care provider.  2024 Elsevier/Gold Standard (2023-05-19 00:00:00) [facility] - A DEPT OF [facility]  Discharge Instructions: Thank you for choosing [facility] to provide your oncology and hematology care.   If you have a lab appointment with the Cancer Center, please go directly  to the Cancer Center and check in at the registration area.   Wear comfortable clothing and clothing appropriate for easy access to any Portacath or PICC line.   We strive to give you quality time with your  provider. You may need to reschedule your appointment if you arrive late (15 or more minutes).  Arriving late affects you and other patients whose appointments are after yours.  Also, if you miss three or more appointments without notifying the office, you may be dismissed from the clinic at the provider's discretion.      For prescription refill requests, have your pharmacy contact our office and allow 72 hours for refills to be completed.    Today you received the following chemotherapy and/or immunotherapy agents :  Cyclophosphamide  & Daratumumab       To help prevent nausea and vomiting after your treatment, we encourage you to take your nausea medication as directed.  BELOW ARE SYMPTOMS THAT SHOULD BE REPORTED IMMEDIATELY: *FEVER GREATER THAN 100.4 F (38 C) OR HIGHER *CHILLS OR SWEATING *NAUSEA AND VOMITING THAT IS NOT CONTROLLED WITH YOUR NAUSEA MEDICATION *UNUSUAL SHORTNESS OF BREATH *UNUSUAL BRUISING OR BLEEDING *URINARY PROBLEMS (pain or burning when urinating, or frequent urination) *BOWEL PROBLEMS (unusual diarrhea, constipation, pain near the anus) TENDERNESS IN MOUTH AND THROAT WITH OR WITHOUT PRESENCE OF ULCERS (sore throat, sores in mouth, or a toothache) UNUSUAL RASH, SWELLING OR PAIN  UNUSUAL VAGINAL DISCHARGE OR ITCHING   Items with * indicate a potential emergency and should be followed up as soon as possible or go to the Emergency Department if any problems should occur.  Please show the CHEMOTHERAPY ALERT CARD or IMMUNOTHERAPY ALERT CARD at check-in to the Emergency Department and triage nurse.  Should you have questions after your visit or need to cancel or reschedule your appointment, please contact [facility] - A DEPT OF [facility]  Dept: [phone]  and follow the prompts.  Office hours are 8:00 a.m. to 4:30 p.m. Monday - Friday. Please note that voicemails left after 4:00 p.m. may not be returned until the following business day.   We are closed weekends and major holidays. You have access to a nurse at all times for urgent questions. Please call the main number to the clinic Dept: [phone] and follow the prompts.   For any non-urgent questions, you may also contact your provider using MyChart. We now offer e-Visits for anyone 19 and older to request care online for non-urgent symptoms. For details visit [email or web address].   Also download the MyChart app! Go to the app store, search "MyChart", open the app, select [facility], and log in with your MyChart username and password.

## 2023-11-03 NOTE — Progress Notes (Signed)
 Chemo/immuno held until albumin  given.  VS rechecked & BP better/ OK to treat with chemo/Immuno today per Dr Salomon Cree.

## 2023-11-03 NOTE — Progress Notes (Signed)
 BP low today @ 76/47. Pt to receive Albumin  in infusion and then recheck BP. Notify Dr. Salomon Cree of BP to get ok for treatment.

## 2023-11-06 ENCOUNTER — Ambulatory Visit: Payer: Medicare Other | Admitting: Nurse Practitioner

## 2023-11-07 NOTE — Patient Instructions (Incomplete)

## 2023-11-08 ENCOUNTER — Encounter: Payer: Self-pay | Admitting: Nurse Practitioner

## 2023-11-08 ENCOUNTER — Ambulatory Visit: Admitting: Nurse Practitioner

## 2023-11-09 ENCOUNTER — Telehealth: Payer: Self-pay | Admitting: *Deleted

## 2023-11-09 ENCOUNTER — Encounter: Payer: Self-pay | Admitting: Hematology

## 2023-11-09 NOTE — Telephone Encounter (Signed)
 Labs are suggestive of under-replacement.  I have him taking 200 mcg of Levothyroxine .  He may need more.  However, his max replacement dose based on body weight is 203 mcg and of course they do not make that dosage formulation.  If we increase it may increase likelihood of more dizziness, unexplained weight loss, diarrhea, tremors, or heart palpitations.   I would almost like to have him stay at his current dose given everything else that is going on with him right now.  Don't want to rock the boat too much in his case.

## 2023-11-09 NOTE — Telephone Encounter (Signed)
 Patient was called and made aware.
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
## 2023-11-09 NOTE — Telephone Encounter (Signed)
 Voicemail was left from his wife, [name]. They forgot the patient's appointment yesterday. Patient is undergoing Chemo , having his second treatment on this past Friday. He is having side effects , rough time from it. He did have lab work done, she is asking if Laurina Popper will review those and let them know if he needs adjustment with thyroid  medication and to see when he may need to make another appointment.

## 2023-11-10 ENCOUNTER — Inpatient Hospital Stay

## 2023-11-10 ENCOUNTER — Inpatient Hospital Stay (HOSPITAL_BASED_OUTPATIENT_CLINIC_OR_DEPARTMENT_OTHER): Admitting: Hematology

## 2023-11-10 ENCOUNTER — Ambulatory Visit

## 2023-11-10 VITALS — BP 102/66 | HR 79 | Temp 98.0°F | Resp 20

## 2023-11-10 VITALS — BP 105/69 | HR 96 | Temp 97.0°F | Resp 20 | Wt 276.6 lb

## 2023-11-10 DIAGNOSIS — I951 Orthostatic hypotension: Secondary | ICD-10-CM

## 2023-11-10 DIAGNOSIS — Z7902 Long term (current) use of antithrombotics/antiplatelets: Secondary | ICD-10-CM | POA: Diagnosis not present

## 2023-11-10 DIAGNOSIS — Z5111 Encounter for antineoplastic chemotherapy: Secondary | ICD-10-CM | POA: Diagnosis not present

## 2023-11-10 DIAGNOSIS — E8809 Other disorders of plasma-protein metabolism, not elsewhere classified: Secondary | ICD-10-CM | POA: Diagnosis not present

## 2023-11-10 DIAGNOSIS — C9000 Multiple myeloma not having achieved remission: Secondary | ICD-10-CM

## 2023-11-10 DIAGNOSIS — Z79624 Long term (current) use of inhibitors of nucleotide synthesis: Secondary | ICD-10-CM | POA: Diagnosis not present

## 2023-11-10 DIAGNOSIS — E8581 Light chain (AL) amyloidosis: Secondary | ICD-10-CM | POA: Diagnosis not present

## 2023-11-10 DIAGNOSIS — Z87891 Personal history of nicotine dependence: Secondary | ICD-10-CM | POA: Diagnosis not present

## 2023-11-10 DIAGNOSIS — Z79899 Other long term (current) drug therapy: Secondary | ICD-10-CM | POA: Diagnosis not present

## 2023-11-10 DIAGNOSIS — Z5112 Encounter for antineoplastic immunotherapy: Secondary | ICD-10-CM | POA: Diagnosis not present

## 2023-11-10 DIAGNOSIS — Z7952 Long term (current) use of systemic steroids: Secondary | ICD-10-CM | POA: Diagnosis not present

## 2023-11-10 DIAGNOSIS — Z7982 Long term (current) use of aspirin: Secondary | ICD-10-CM | POA: Diagnosis not present

## 2023-11-10 LAB — CBC WITH DIFFERENTIAL (CANCER CENTER ONLY)
Abs Immature Granulocytes: 0.02 10*3/uL (ref 0.00–0.07)
Basophils Absolute: 0 10*3/uL (ref 0.0–0.1)
Basophils Relative: 0 %
Eosinophils Absolute: 0.1 10*3/uL (ref 0.0–0.5)
Eosinophils Relative: 1 %
HCT: 42 % (ref 39.0–52.0)
Hemoglobin: 14.5 g/dL (ref 13.0–17.0)
Immature Granulocytes: 0 %
Lymphocytes Relative: 23 %
Lymphs Abs: 1.5 10*3/uL (ref 0.7–4.0)
MCH: 31.9 pg (ref 26.0–34.0)
MCHC: 34.5 g/dL (ref 30.0–36.0)
MCV: 92.5 fL (ref 80.0–100.0)
Monocytes Absolute: 0.5 10*3/uL (ref 0.1–1.0)
Monocytes Relative: 8 %
Neutro Abs: 4.5 10*3/uL (ref 1.7–7.7)
Neutrophils Relative %: 68 %
Platelet Count: 186 10*3/uL (ref 150–400)
RBC: 4.54 MIL/uL (ref 4.22–5.81)
RDW: 15.2 % (ref 11.5–15.5)
WBC Count: 6.7 10*3/uL (ref 4.0–10.5)
nRBC: 0 % (ref 0.0–0.2)

## 2023-11-10 LAB — CMP (CANCER CENTER ONLY)
ALT: 74 U/L — ABNORMAL HIGH (ref 0–44)
AST: 56 U/L — ABNORMAL HIGH (ref 15–41)
Albumin: 1.9 g/dL — ABNORMAL LOW (ref 3.5–5.0)
Alkaline Phosphatase: 79 U/L (ref 38–126)
Anion gap: 3 — ABNORMAL LOW (ref 5–15)
BUN: 17 mg/dL (ref 6–20)
CO2: 33 mmol/L — ABNORMAL HIGH (ref 22–32)
Calcium: 7.6 mg/dL — ABNORMAL LOW (ref 8.9–10.3)
Chloride: 104 mmol/L (ref 98–111)
Creatinine: 0.71 mg/dL (ref 0.61–1.24)
GFR, Estimated: >60 mL/min (ref >60)
Glucose, Bld: 105 mg/dL — ABNORMAL HIGH (ref 70–99)
Potassium: 4.1 mmol/L (ref 3.5–5.1)
Sodium: 140 mmol/L (ref 135–145)
Total Bilirubin: 0.3 mg/dL (ref 0.0–1.2)
Total Protein: 3.9 g/dL — ABNORMAL LOW (ref 6.5–8.1)

## 2023-11-10 MED ORDER — DEXAMETHASONE 4 MG PO TABS
20.0000 mg | ORAL_TABLET | Freq: Once | ORAL | Status: AC
  Administered 2023-11-10: 20 mg via ORAL
  Filled 2023-11-10: qty 5

## 2023-11-10 MED ORDER — DARATUMUMAB-HYALURONIDASE-FIHJ 1800-30000 MG-UT/15ML ~~LOC~~ SOLN
1800.0000 mg | Freq: Once | SUBCUTANEOUS | Status: AC
  Administered 2023-11-10: 1800 mg via SUBCUTANEOUS
  Filled 2023-11-10: qty 15

## 2023-11-10 MED ORDER — ALBUMIN HUMAN 25 % IV SOLN
25.0000 g | Freq: Once | INTRAVENOUS | Status: AC
  Administered 2023-11-10: 25 g via INTRAVENOUS
  Filled 2023-11-10: qty 100

## 2023-11-10 MED ORDER — DIPHENHYDRAMINE HCL 25 MG PO CAPS
50.0000 mg | ORAL_CAPSULE | Freq: Once | ORAL | Status: AC
  Administered 2023-11-10: 50 mg via ORAL
  Filled 2023-11-10: qty 2

## 2023-11-10 MED ORDER — MONTELUKAST SODIUM 10 MG PO TABS
10.0000 mg | ORAL_TABLET | Freq: Once | ORAL | Status: AC
  Administered 2023-11-10: 10 mg via ORAL
  Filled 2023-11-10: qty 1

## 2023-11-10 MED ORDER — SODIUM CHLORIDE 0.9 % IV SOLN
200.0000 mg/m2 | Freq: Once | INTRAVENOUS | Status: AC
  Administered 2023-11-10: 500 mg via INTRAVENOUS
  Filled 2023-11-10: qty 25

## 2023-11-10 MED ORDER — ACETAMINOPHEN 325 MG PO TABS
650.0000 mg | ORAL_TABLET | Freq: Once | ORAL | Status: AC
  Administered 2023-11-10: 650 mg via ORAL
  Filled 2023-11-10: qty 2

## 2023-11-10 MED ORDER — PALONOSETRON HCL INJECTION 0.25 MG/5ML
0.2500 mg | Freq: Once | INTRAVENOUS | Status: AC
  Administered 2023-11-10: 0.25 mg via INTRAVENOUS
  Filled 2023-11-10: qty 5

## 2023-11-10 NOTE — Progress Notes (Signed)
 Patient seen by Dr. Minnie Amber are within treatment parameters. Pt has chronic low BP takes midodrin / will take midodrin while in infusion from home as directed  Labs reviewed: and are within treatment parameters.  Per physician team, patient is ready for treatment. Please note that modifications are being made to the treatment plan including   Pt to get Albumin  with tx today

## 2023-11-10 NOTE — Patient Instructions (Signed)
 [facility] - A DEPT OF [facility]  Discharge Instructions: Thank you for choosing [facility] to provide your oncology and hematology care.   If you have a lab appointment with the Cancer Center, please go directly to the Cancer Center and check in at the registration area.   Wear comfortable clothing and clothing appropriate for easy access to any Portacath or PICC line.   We strive to give you quality time with your provider. You may need to reschedule your appointment if you arrive late (15 or more minutes).  Arriving late affects you and other patients whose appointments are after yours.  Also, if you miss three or more appointments without notifying the office, you may be dismissed from the clinic at the provider's discretion.      For prescription refill requests, have your pharmacy contact our office and allow 72 hours for refills to be completed.    Today you received the following chemotherapy and/or immunotherapy agents: Cyclophosphamide  (Cytoxan ) and Daratumumab  (Darzalex  Faspro).      To help prevent nausea and vomiting after your treatment, we encourage you to take your nausea medication as directed.  BELOW ARE SYMPTOMS THAT SHOULD BE REPORTED IMMEDIATELY: *FEVER GREATER THAN 100.4 F (38 C) OR HIGHER *CHILLS OR SWEATING *NAUSEA AND VOMITING THAT IS NOT CONTROLLED WITH YOUR NAUSEA MEDICATION *UNUSUAL SHORTNESS OF BREATH *UNUSUAL BRUISING OR BLEEDING *URINARY PROBLEMS (pain or burning when urinating, or frequent urination) *BOWEL PROBLEMS (unusual diarrhea, constipation, pain near the anus) TENDERNESS IN MOUTH AND THROAT WITH OR WITHOUT PRESENCE OF ULCERS (sore throat, sores in mouth, or a toothache) UNUSUAL RASH, SWELLING OR PAIN  UNUSUAL VAGINAL DISCHARGE OR ITCHING   Items with * indicate a potential emergency and should be followed up as soon as possible or go to the Emergency Department if any problems should  occur.  Please show the CHEMOTHERAPY ALERT CARD or IMMUNOTHERAPY ALERT CARD at check-in to the Emergency Department and triage nurse.  Should you have questions after your visit or need to cancel or reschedule your appointment, please contact [facility] - A DEPT OF [facility]  Dept: [phone]  and follow the prompts.  Office hours are 8:00 a.m. to 4:30 p.m. Monday - Friday. Please note that voicemails left after 4:00 p.m. may not be returned until the following business day.  We are closed weekends and major holidays. You have access to a nurse at all times for urgent questions. Please call the main number to the clinic Dept: [phone] and follow the prompts.   For any non-urgent questions, you may also contact your provider using MyChart. We now offer e-Visits for anyone 50 and older to request care online for non-urgent symptoms. For details visit [email or web address].   Also download the MyChart app! Go to the app store, search "MyChart", open the app, select [facility], and log in with your MyChart username and password.  Albumin  Injection What is this medication? ALBUMIN  (al BYOO min) treats low blood volume, which may occur with severe dehydration or bleeding. It works by increasing blood volume so your heart can pump blood to the rest of your body. It may also be used to treat low albumin  levels caused by surgery, infection, or other health conditions. Albumin  is a protein that helps your body balance the level of fluid in your blood vessels. This helps maintain a healthy blood pressure and prevents swelling or edema.  This medicine may be used for other purposes; ask your health care provider or pharmacist if you have questions. COMMON BRAND NAME(S): Albuked , Albumarc, Albuminar, Albuminex , AlbuRx , Albutein , Buminate, Flexbumin , Kedbumin , Macrotec, Plasbumin , Plasbumin -20 What should I tell my care team before I take this medication? They need to  know if you have any of these conditions: Heart disease Kidney disease Low red blood cell levels (anemia) An unusual or allergic reaction to albumin , other medications, foods, dyes, or preservatives Pregnant or trying to get pregnant Breastfeeding How should I use this medication? This medication is infused into a vein. It is given by your care team in a hospital or clinic setting. Talk to your care team about the use of this medication in children. While it may be given to children for selected conditions, precautions do apply. Overdosage: If you think you have taken too much of this medicine contact a poison control center or emergency room at once. NOTE: This medicine is only for you. Do not share this medicine with others. What if I miss a dose? This does not apply. What may interact with this medication? Interactions are not expected. This list may not describe all possible interactions. Give your health care provider a list of all the medicines, herbs, non-prescription drugs, or dietary supplements you use. Also tell them if you smoke, drink alcohol, or use illegal drugs. Some items may interact with your medicine. What should I watch for while using this medication? Your condition will be monitored carefully while you are receiving this medication. This product is derived from human plasma. Talk to your care team about the risks and benefits of this medication. What side effects may I notice from receiving this medication? Side effects that you should report to your care team as soon as possible: Allergic reactions--skin rash, itching, hives, swelling of the face, lips, tongue, or throat Heart failure--shortness of breath, swelling of the ankles, feet, or hands, sudden weight gain, unusual weakness or fatigue Increase in blood pressure Shortness of breath or trouble breathing, cough, unusual weakness or fatigue, blue skin or lips Side effects that usually do not require medical  attention (report these to your care team if they continue or are bothersome): Flushing Headache Nausea This list may not describe all possible side effects. Call your doctor for medical advice about side effects. You may report side effects to FDA at 1-800-FDA-1088. Where should I keep my medication? This medication is given in a hospital or clinic. It will not be stored at home. NOTE: This sheet is a summary. It may not cover all possible information. If you have questions about this medicine, talk to your doctor, pharmacist, or health care provider.  2024 Elsevier/Gold Standard (2023-05-19 00:00:00)

## 2023-11-14 ENCOUNTER — Ambulatory Visit (HOSPITAL_COMMUNITY)
Admission: RE | Admit: 2023-11-14 | Discharge: 2023-11-14 | Disposition: A | Discharge status: Home / Self Care | Source: Ambulatory Visit | Attending: Cardiology | Admitting: Cardiology

## 2023-11-14 ENCOUNTER — Other Ambulatory Visit (HOSPITAL_COMMUNITY): Payer: Self-pay

## 2023-11-14 ENCOUNTER — Encounter (HOSPITAL_COMMUNITY): Payer: Self-pay | Admitting: Cardiology

## 2023-11-14 ENCOUNTER — Telehealth (HOSPITAL_COMMUNITY): Payer: Self-pay | Admitting: Pharmacist

## 2023-11-14 VITALS — BP 71/58 | HR 69 | Ht 72.0 in | Wt 280.0 lb

## 2023-11-14 DIAGNOSIS — Z7902 Long term (current) use of antithrombotics/antiplatelets: Secondary | ICD-10-CM | POA: Insufficient documentation

## 2023-11-14 DIAGNOSIS — I951 Orthostatic hypotension: Secondary | ICD-10-CM | POA: Diagnosis not present

## 2023-11-14 DIAGNOSIS — Z951 Presence of aortocoronary bypass graft: Secondary | ICD-10-CM | POA: Insufficient documentation

## 2023-11-14 DIAGNOSIS — Z7982 Long term (current) use of aspirin: Secondary | ICD-10-CM | POA: Diagnosis not present

## 2023-11-14 DIAGNOSIS — E854 Organ-limited amyloidosis: Secondary | ICD-10-CM | POA: Insufficient documentation

## 2023-11-14 DIAGNOSIS — I43 Cardiomyopathy in diseases classified elsewhere: Secondary | ICD-10-CM | POA: Insufficient documentation

## 2023-11-14 DIAGNOSIS — Z8673 Personal history of transient ischemic attack (TIA), and cerebral infarction without residual deficits: Secondary | ICD-10-CM | POA: Insufficient documentation

## 2023-11-14 DIAGNOSIS — Z955 Presence of coronary angioplasty implant and graft: Secondary | ICD-10-CM | POA: Diagnosis not present

## 2023-11-14 DIAGNOSIS — N049 Nephrotic syndrome with unspecified morphologic changes: Secondary | ICD-10-CM | POA: Insufficient documentation

## 2023-11-14 DIAGNOSIS — I5030 Unspecified diastolic (congestive) heart failure: Secondary | ICD-10-CM | POA: Insufficient documentation

## 2023-11-14 DIAGNOSIS — I2510 Atherosclerotic heart disease of native coronary artery without angina pectoris: Secondary | ICD-10-CM | POA: Insufficient documentation

## 2023-11-14 MED ORDER — DROXIDOPA 100 MG PO CAPS: 100.0000 mg | ORAL_CAPSULE | Freq: Three times a day (TID) | ORAL | 3 refills | Status: DC

## 2023-11-14 MED ORDER — MIDODRINE HCL 10 MG PO TABS: 20.0000 mg | ORAL_TABLET | Freq: Three times a day (TID) | ORAL | Status: DC

## 2023-11-14 NOTE — Progress Notes (Signed)
 ADVANCED HEART FAILURE FOLLOW UP CLINIC NOTE  Referring Physician: Leesa Pulling, MD  Primary Care: Leesa Pulling, MD Primary Cardiologist:  HPI: Joseph [name]. is a 58 y.o. male who presents for follow up of HFpEF and AL amyloid.      Patient has a fairly complex medical history.  Notable history of coronary artery disease with PCI to the LAD in 2015, moderate RCA stenosis at the time.  He established care with [facility] cardiology in 2023 for HCM and was trialed on mavacamten for worsening obstructive symptoms.  he reported that the medication interfere with his pain medication and so he was referred to [facility] clinic for septal myectomy.  Procedure was complicated by postoperative atrial fibrillation, likely TIA/CVA, but otherwise good result with improvement in gradient.  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  Mass spectroscopy was not able to identify the dominant amyloid protein.   Since that time he was admitted in December for CVA and was placed on aspirin /Plavix .  Additionally, he was admitted in March for symptomatic hypotension.  All blood pressure medications were held with improvement.  He was also found to have new nephrotic syndrome and follow-up was established with nephrology.   Since that time he had a bone marrow biopsy on 09/28/23 that showed small foci of amyloid deposits and 10% plasma cell burden.  Patient has been arranged to start on Dara-CyBorD, though planning to hold Velcade given neuropathy.     SUBJECTIVE:  Overall feels "terrible." Had to stop the fludricortisone due to volume retention, has been taking midodrine  20mg  TID with an occasional dose at night. Has been receiving albumin  in clinic which usually helps for about 24 hours then worsens. His albumin  was detectable thankfully on most recent blood work. Planning to go to [employer] this upcoming weekend.    PMH, current medications, allergies, social history, and family history reviewed in epic.  PHYSICAL EXAM: Vitals:   11/14/23 1554  BP: (!) 71/58  Pulse: 69  SpO2: 96%   GENERAL: Pale, ill appearing PULM:  Normal work of breathing, clear to auscultation bilaterally. Respirations are unlabored.  CARDIAC:  JVP: flat         Normal rate with regular rhythm. No murmurs, rubs or gallops.  2+ LE edema. Warm and well perfused extremities. ABDOMEN: Soft, non-tender, non-distended. NEUROLOGIC: Patient is oriented x3 with no focal or lateralizing neurologic deficits.    DATA REVIEW  ECG: 10/03/2022: NSR, LBBB    ECHO: 08/22/23: S/p septal reduction therapy, grade I DD, LVH   CATH: 07/2022: moderate proximal RCA stenosis, patent LAD and Lcx with mid ISR of mid LAD stent. 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  Suspect AL amyloid with obstructive physiology as the cause for his initial referral, kappa/lambda may have been missed on his initial endomyocardial biopsy.  Started chemotherapy.    Main symptom is debilitating orthostatic hypotension. He is currently taking midodrine  20mg  TID with an occasional extra dose at night. This helped initially but now barely keeps him upright. He failed florinef  due to volume retention. Small case series show benefit with droxidopa in this patient population. Given that he has failed multiple therapies and supportive  care including leg wraps will start today.  - Start droxidopa 100mg  TID - Continue midodrine  20mg  TID - Lasix  as needed for fluid retention - Follow up with oncology - Echo ordered and pending   CAD s/p single vessel CABG: Prior LAD PCI. - Continue plavix  alone   CVA: No  evidence of atrial fibrillation.  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 - Follow up with nephrology  Follow up in 2 months  Arta Lark, MD Advanced Heart Failure Mechanical Circulatory Support 11/14/23

## 2023-11-14 NOTE — Telephone Encounter (Signed)
 Patient Advocate Encounter   Received notification from [employer] that prior authorization for droxidopa is required.   PA submitted on CoverMyMeds Key [id number] Status is pending   Will continue to follow.   Luster Salters, PharmD, BCPS, BCCP, CPP Heart Failure Clinic Pharmacist [phone]  .

## 2023-11-14 NOTE — Telephone Encounter (Signed)
 Advanced Heart Failure Patient Advocate Encounter  Prior Authorization for droxidopa has been approved.    PA# [id number] Effective dates: 11/14/23 through 12/15/23  Luster Salters, PharmD, BCPS, BCCP, CPP Heart Failure Clinic Pharmacist [phone]

## 2023-11-14 NOTE — Patient Instructions (Addendum)
 START Droxidopa  100  mg Three times a day   Your physician recommends that you schedule a follow-up appointment in: 2 months  If you have any questions or concerns before your next appointment please send us  a message through [facility] [place] or call our office at [phone].    TO LEAVE A MESSAGE FOR THE NURSE SELECT OPTION 2, PLEASE LEAVE A MESSAGE INCLUDING: YOUR NAME DATE OF BIRTH CALL BACK NUMBER REASON FOR CALL**this is important as we prioritize the call backs  YOU WILL RECEIVE A CALL BACK THE SAME DAY AS LONG AS YOU CALL BEFORE 4:00 PM  At the Advanced Heart Failure Clinic, you and your health needs are our priority. As part of our continuing mission to provide you with exceptional heart care, we have created designated Provider Care Teams. These Care Teams include your primary Cardiologist (physician) and Advanced Practice Providers (APPs- Physician Assistants and Nurse Practitioners) who all work together to provide you with the care you need, when you need it.   You may see any of the following providers on your designated Care Team at your next follow up: Dr Jules Oar Dr Peder Bourdon Dr. Alwin Baars Dr. Arta Lark Amy Marijane Shoulders, NP Ruddy Corral, [place] [facility] [place], [place] Dennise Fitz, NP [place] Lee, NP Shawnee Dellen, NP Luster Salters, PharmD Bevely Brush, PharmD   Please be sure to bring in all your medications bottles to every appointment.    Thank you for choosing [facility]

## 2023-11-15 ENCOUNTER — Other Ambulatory Visit: Payer: Self-pay

## 2023-11-15 ENCOUNTER — Other Ambulatory Visit (HOSPITAL_COMMUNITY): Payer: Self-pay | Admitting: Pharmacist

## 2023-11-15 ENCOUNTER — Telehealth (HOSPITAL_COMMUNITY): Payer: Self-pay | Admitting: Cardiology

## 2023-11-15 MED ORDER — DROXIDOPA 100 MG PO CAPS
100.0000 mg | ORAL_CAPSULE | Freq: Three times a day (TID) | ORAL | 3 refills | Status: DC
  Filled 2023-11-15: qty 90, 30d supply, fill #0
  Filled 2023-12-11: qty 90, 30d supply, fill #1
  Filled 2024-01-16 – 2024-01-22 (×2): qty 90, 30d supply, fill #2
  Filled 2024-02-20 – 2024-02-29 (×2): qty 90, 30d supply, fill #3

## 2023-11-15 NOTE — Telephone Encounter (Signed)
 [facility] Will need to send to new pharmacy  Joseph [name] aware Will speak with [facility] specialty pharmacy to iron  out

## 2023-11-16 ENCOUNTER — Encounter (HOSPITAL_COMMUNITY): Payer: Self-pay

## 2023-11-16 ENCOUNTER — Other Ambulatory Visit (HOSPITAL_COMMUNITY): Payer: Self-pay

## 2023-11-16 ENCOUNTER — Other Ambulatory Visit: Payer: Self-pay

## 2023-11-16 ENCOUNTER — Other Ambulatory Visit (HOSPITAL_COMMUNITY): Payer: Self-pay | Admitting: Pharmacy Technician
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
## 2023-11-16 NOTE — Progress Notes (Signed)
 Specialty Pharmacy Initiation Note   Joseph C Cellucci Jr. is a 58 y.o. male who will be followed by the specialty pharmacy service for RxSp Cardiology    Review of administration, indication, effectiveness, safety, potential side effects, storage/disposable, and missed dose instructions occurred today for patient's specialty medication(s) Droxidopa Joseph [name])     Patient/Caregiver did not have any additional questions or concerns.   Patient's therapy is appropriate to: Initiate    Goals Addressed             This Visit's Progress    Maintain optimal adherence to therapy       Patient is initiating therapy. Patient will maintain adherence         Joseph [name] Margery Sheets Specialty Pharmacist

## 2023-11-16 NOTE — Progress Notes (Signed)
 Specialty Pharmacy Initial Fill Coordination Note  Joseph [name]. is a 58 y.o. male contacted today regarding initial fill of specialty medication(s) Droxidopa Derwood Flor)   Patient requested [place] [facility] at [facility] at [place] date: 11/17/23  Medication will be filled on 11/17/23.   Patient is aware of $84.77 copayment.   Correne Dillon, CPhT

## 2023-11-17 ENCOUNTER — Other Ambulatory Visit (HOSPITAL_COMMUNITY): Payer: Self-pay

## 2023-11-17 ENCOUNTER — Inpatient Hospital Stay: Admitting: Hematology

## 2023-11-17 ENCOUNTER — Encounter: Payer: Self-pay | Admitting: Hematology

## 2023-11-17 ENCOUNTER — Inpatient Hospital Stay: Admitting: Licensed Clinical Social Worker

## 2023-11-17 ENCOUNTER — Inpatient Hospital Stay

## 2023-11-17 ENCOUNTER — Other Ambulatory Visit: Payer: Self-pay

## 2023-11-17 VITALS — BP 106/66

## 2023-11-17 VITALS — BP 88/55 | HR 70 | Temp 97.0°F | Resp 19 | Wt 283.6 lb

## 2023-11-17 DIAGNOSIS — Z5111 Encounter for antineoplastic chemotherapy: Secondary | ICD-10-CM | POA: Diagnosis not present

## 2023-11-17 DIAGNOSIS — Z5112 Encounter for antineoplastic immunotherapy: Secondary | ICD-10-CM | POA: Diagnosis not present

## 2023-11-17 DIAGNOSIS — Z7982 Long term (current) use of aspirin: Secondary | ICD-10-CM | POA: Diagnosis not present

## 2023-11-17 DIAGNOSIS — E8809 Other disorders of plasma-protein metabolism, not elsewhere classified: Secondary | ICD-10-CM

## 2023-11-17 DIAGNOSIS — I959 Hypotension, unspecified: Secondary | ICD-10-CM

## 2023-11-17 DIAGNOSIS — Z87891 Personal history of nicotine dependence: Secondary | ICD-10-CM | POA: Diagnosis not present

## 2023-11-17 DIAGNOSIS — C9000 Multiple myeloma not having achieved remission: Secondary | ICD-10-CM

## 2023-11-17 DIAGNOSIS — Z79899 Other long term (current) drug therapy: Secondary | ICD-10-CM | POA: Diagnosis not present

## 2023-11-17 DIAGNOSIS — Z7902 Long term (current) use of antithrombotics/antiplatelets: Secondary | ICD-10-CM | POA: Diagnosis not present

## 2023-11-17 DIAGNOSIS — E8581 Light chain (AL) amyloidosis: Secondary | ICD-10-CM

## 2023-11-17 DIAGNOSIS — Z79624 Long term (current) use of inhibitors of nucleotide synthesis: Secondary | ICD-10-CM | POA: Diagnosis not present

## 2023-11-17 DIAGNOSIS — Z7952 Long term (current) use of systemic steroids: Secondary | ICD-10-CM | POA: Diagnosis not present

## 2023-11-17 LAB — CMP (CANCER CENTER ONLY)
ALT: 105 U/L — ABNORMAL HIGH (ref 0–44)
AST: 76 U/L — ABNORMAL HIGH (ref 15–41)
Albumin: 1.7 g/dL — ABNORMAL LOW (ref 3.5–5.0)
Alkaline Phosphatase: 86 U/L (ref 38–126)
Anion gap: 1 — ABNORMAL LOW (ref 5–15)
BUN: 18 mg/dL (ref 6–20)
CO2: 34 mmol/L — ABNORMAL HIGH (ref 22–32)
Calcium: 7.4 mg/dL — ABNORMAL LOW (ref 8.9–10.3)
Chloride: 106 mmol/L (ref 98–111)
Creatinine: 0.64 mg/dL (ref 0.61–1.24)
GFR, Estimated: >60 mL/min (ref >60)
Glucose, Bld: 115 mg/dL — ABNORMAL HIGH (ref 70–99)
Potassium: 4.2 mmol/L (ref 3.5–5.1)
Sodium: 141 mmol/L (ref 135–145)
Total Bilirubin: 0.3 mg/dL (ref 0.0–1.2)
Total Protein: 3.7 g/dL — ABNORMAL LOW (ref 6.5–8.1)

## 2023-11-17 LAB — CBC WITH DIFFERENTIAL (CANCER CENTER ONLY)
Abs Immature Granulocytes: 0.02 10*3/uL (ref 0.00–0.07)
Basophils Absolute: 0 10*3/uL (ref 0.0–0.1)
Basophils Relative: 0 %
Eosinophils Absolute: 0 10*3/uL (ref 0.0–0.5)
Eosinophils Relative: 0 %
HCT: 43 % (ref 39.0–52.0)
Hemoglobin: 14.7 g/dL (ref 13.0–17.0)
Immature Granulocytes: 0 %
Lymphocytes Relative: 24 %
Lymphs Abs: 1.9 10*3/uL (ref 0.7–4.0)
MCH: 32.3 pg (ref 26.0–34.0)
MCHC: 34.2 g/dL (ref 30.0–36.0)
MCV: 94.5 fL (ref 80.0–100.0)
Monocytes Absolute: 0.5 10*3/uL (ref 0.1–1.0)
Monocytes Relative: 7 %
Neutro Abs: 5.2 10*3/uL (ref 1.7–7.7)
Neutrophils Relative %: 69 %
Platelet Count: 148 10*3/uL — ABNORMAL LOW (ref 150–400)
RBC: 4.55 MIL/uL (ref 4.22–5.81)
RDW: 15 % (ref 11.5–15.5)
WBC Count: 7.7 10*3/uL (ref 4.0–10.5)
nRBC: 0 % (ref 0.0–0.2)

## 2023-11-17 MED ORDER — ACETAMINOPHEN 325 MG PO TABS
650.0000 mg | ORAL_TABLET | Freq: Once | ORAL | Status: AC
  Administered 2023-11-17: 650 mg via ORAL
  Filled 2023-11-17: qty 2

## 2023-11-17 MED ORDER — DEXAMETHASONE 4 MG PO TABS
20.0000 mg | ORAL_TABLET | Freq: Once | ORAL | Status: AC
  Administered 2023-11-17: 20 mg via ORAL
  Filled 2023-11-17: qty 5

## 2023-11-17 MED ORDER — SODIUM CHLORIDE 0.9 % IV SOLN
200.0000 mg/m2 | Freq: Once | INTRAVENOUS | Status: AC
  Administered 2023-11-17: 500 mg via INTRAVENOUS
  Filled 2023-11-17: qty 25

## 2023-11-17 MED ORDER — DIPHENHYDRAMINE HCL 25 MG PO CAPS
50.0000 mg | ORAL_CAPSULE | Freq: Once | ORAL | Status: AC
  Administered 2023-11-17: 50 mg via ORAL
  Filled 2023-11-17: qty 2

## 2023-11-17 MED ORDER — DARATUMUMAB-HYALURONIDASE-FIHJ 1800-30000 MG-UT/15ML ~~LOC~~ SOLN
1800.0000 mg | Freq: Once | SUBCUTANEOUS | Status: AC
  Administered 2023-11-17: 1800 mg via SUBCUTANEOUS
  Filled 2023-11-17: qty 15

## 2023-11-17 MED ORDER — PALONOSETRON HCL INJECTION 0.25 MG/5ML
0.2500 mg | Freq: Once | INTRAVENOUS | Status: AC
  Administered 2023-11-17: 0.25 mg via INTRAVENOUS
  Filled 2023-11-17: qty 5

## 2023-11-17 MED ORDER — SODIUM CHLORIDE 0.9 % IV SOLN: INTRAVENOUS | Status: DC

## 2023-11-17 MED ORDER — ALBUMIN HUMAN 25 % IV SOLN
25.0000 g | Freq: Once | INTRAVENOUS | Status: AC
  Administered 2023-11-17: 25 g via INTRAVENOUS
  Filled 2023-11-17: qty 100

## 2023-11-17 NOTE — Patient Instructions (Signed)
 [facility] - A DEPT OF [facility]  Discharge Instructions: Thank you for choosing [facility] to provide your oncology and hematology care.   If you have a lab appointment with the Cancer Center, please go directly to the Cancer Center and check in at the registration area.   Wear comfortable clothing and clothing appropriate for easy access to any Portacath or PICC line.   We strive to give you quality time with your provider. You may need to reschedule your appointment if you arrive late (15 or more minutes).  Arriving late affects you and other patients whose appointments are after yours.  Also, if you miss three or more appointments without notifying the office, you may be dismissed from the clinic at the provider's discretion.      For prescription refill requests, have your pharmacy contact our office and allow 72 hours for refills to be completed.    Today you received the following chemotherapy and/or immunotherapy agents: Cyclophosphamide  (Cytoxan ) and Daratumumab  (Darzalex  Faspro).      To help prevent nausea and vomiting after your treatment, we encourage you to take your nausea medication as directed.  BELOW ARE SYMPTOMS THAT SHOULD BE REPORTED IMMEDIATELY: *FEVER GREATER THAN 100.4 F (38 C) OR HIGHER *CHILLS OR SWEATING *NAUSEA AND VOMITING THAT IS NOT CONTROLLED WITH YOUR NAUSEA MEDICATION *UNUSUAL SHORTNESS OF BREATH *UNUSUAL BRUISING OR BLEEDING *URINARY PROBLEMS (pain or burning when urinating, or frequent urination) *BOWEL PROBLEMS (unusual diarrhea, constipation, pain near the anus) TENDERNESS IN MOUTH AND THROAT WITH OR WITHOUT PRESENCE OF ULCERS (sore throat, sores in mouth, or a toothache) UNUSUAL RASH, SWELLING OR PAIN  UNUSUAL VAGINAL DISCHARGE OR ITCHING   Items with * indicate a potential emergency and should be followed up as soon as possible or go to the Emergency Department if any problems should  occur.  Please show the CHEMOTHERAPY ALERT CARD or IMMUNOTHERAPY ALERT CARD at check-in to the Emergency Department and triage nurse.  Should you have questions after your visit or need to cancel or reschedule your appointment, please contact [facility] - A DEPT OF [facility]  Dept: [phone]  and follow the prompts.  Office hours are 8:00 a.m. to 4:30 p.m. Monday - Friday. Please note that voicemails left after 4:00 p.m. may not be returned until the following business day.  We are closed weekends and major holidays. You have access to a nurse at all times for urgent questions. Please call the main number to the clinic Dept: [phone] and follow the prompts.   For any non-urgent questions, you may also contact your provider using MyChart. We now offer e-Visits for anyone 39 and older to request care online for non-urgent symptoms. For details visit [email or web address].   Also download the MyChart app! Go to the app store, search "MyChart", open the app, select [facility], and log in with your MyChart username and password.  Albumin  Injection What is this medication? ALBUMIN  (al BYOO min) treats low blood volume, which may occur with severe dehydration or bleeding. It works by increasing blood volume so your heart can pump blood to the rest of your body. It may also be used to treat low albumin  levels caused by surgery, infection, or other health conditions. Albumin  is a protein that helps your body balance the level of fluid in your blood vessels. This helps maintain a healthy blood pressure and prevents swelling or edema.  This medicine may be used for other purposes; ask your health care provider or pharmacist if you have questions. COMMON BRAND NAME(S): Albuked , Albumarc, Albuminar, Albuminex , AlbuRx , Albutein , Buminate, Flexbumin , Kedbumin , Macrotec, Plasbumin , Plasbumin -20 What should I tell my care team before I take this medication? They need to  know if you have any of these conditions: Heart disease Kidney disease Low red blood cell levels (anemia) An unusual or allergic reaction to albumin , other medications, foods, dyes, or preservatives Pregnant or trying to get pregnant Breastfeeding How should I use this medication? This medication is infused into a vein. It is given by your care team in a hospital or clinic setting. Talk to your care team about the use of this medication in children. While it may be given to children for selected conditions, precautions do apply. Overdosage: If you think you have taken too much of this medicine contact a poison control center or emergency room at once. NOTE: This medicine is only for you. Do not share this medicine with others. What if I miss a dose? This does not apply. What may interact with this medication? Interactions are not expected. This list may not describe all possible interactions. Give your health care provider a list of all the medicines, herbs, non-prescription drugs, or dietary supplements you use. Also tell them if you smoke, drink alcohol, or use illegal drugs. Some items may interact with your medicine. What should I watch for while using this medication? Your condition will be monitored carefully while you are receiving this medication. This product is derived from human plasma. Talk to your care team about the risks and benefits of this medication. What side effects may I notice from receiving this medication? Side effects that you should report to your care team as soon as possible: Allergic reactions--skin rash, itching, hives, swelling of the face, lips, tongue, or throat Heart failure--shortness of breath, swelling of the ankles, feet, or hands, sudden weight gain, unusual weakness or fatigue Increase in blood pressure Shortness of breath or trouble breathing, cough, unusual weakness or fatigue, blue skin or lips Side effects that usually do not require medical  attention (report these to your care team if they continue or are bothersome): Flushing Headache Nausea This list may not describe all possible side effects. Call your doctor for medical advice about side effects. You may report side effects to FDA at 1-800-FDA-1088. Where should I keep my medication? This medication is given in a hospital or clinic. It will not be stored at home. NOTE: This sheet is a summary. It may not cover all possible information. If you have questions about this medicine, talk to your doctor, pharmacist, or health care provider.  2024 Elsevier/Gold Standard (2023-05-19 00:00:00)

## 2023-11-17 NOTE — Progress Notes (Signed)
 [facility] Clinical Social Work  Clinical Social Work was referred by nurse for financial concerns with medication co-pay.  Clinical Social Worker met with patient and pt's wife, Joseph [name], to offer support and assess for needs.   Pt has a prescription Joseph [name]) that has an $87/month co-pay and they were wondering about any assistance. Pt would not be eligible for manufacturer assistance due to having medicare insurance. CSW shared information on [employer], [employer], and [employer] which all currently have funds open for co-pay assistance for amyloidosis.  Pt's wife given printed information on how to apply. CSW also encouraged her to call the prescribing doctor to determine if they are aware of any other assistance. Pt's wife voiced understanding.  Contact information given for pt's primary social worker, Joseph [name].   Joseph Wisdom E Christia Coaxum, LCSW  Clinical Social Worker [facility]

## 2023-11-20 ENCOUNTER — Other Ambulatory Visit (HOSPITAL_COMMUNITY): Payer: Self-pay

## 2023-11-24 ENCOUNTER — Encounter: Payer: Self-pay | Admitting: Hematology

## 2023-11-24 ENCOUNTER — Other Ambulatory Visit: Payer: Self-pay

## 2023-11-24 ENCOUNTER — Telehealth: Payer: Self-pay

## 2023-11-24 DIAGNOSIS — E8581 Light chain (AL) amyloidosis: Secondary | ICD-10-CM

## 2023-11-24 NOTE — Telephone Encounter (Signed)
 Cancer Claim form completed as requested and Patient's Spouse notified. Copy of form placed for pick-up as requested. No other needs or concerns noted at this time.
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
## 2023-11-27 ENCOUNTER — Inpatient Hospital Stay

## 2023-11-27 ENCOUNTER — Ambulatory Visit (HOSPITAL_COMMUNITY)
Admission: RE | Admit: 2023-11-27 | Discharge: 2023-11-27 | Disposition: A | Discharge status: Home / Self Care | Source: Ambulatory Visit | Attending: Cardiology

## 2023-11-27 ENCOUNTER — Inpatient Hospital Stay (HOSPITAL_BASED_OUTPATIENT_CLINIC_OR_DEPARTMENT_OTHER): Admitting: Hematology

## 2023-11-27 ENCOUNTER — Inpatient Hospital Stay: Attending: Internal Medicine

## 2023-11-27 VITALS — BP 88/59 | HR 71 | Temp 97.5°F | Resp 18 | Wt 291.5 lb

## 2023-11-27 VITALS — BP 118/69

## 2023-11-27 DIAGNOSIS — I509 Heart failure, unspecified: Secondary | ICD-10-CM | POA: Insufficient documentation

## 2023-11-27 DIAGNOSIS — E854 Organ-limited amyloidosis: Secondary | ICD-10-CM | POA: Insufficient documentation

## 2023-11-27 DIAGNOSIS — C9000 Multiple myeloma not having achieved remission: Secondary | ICD-10-CM

## 2023-11-27 DIAGNOSIS — E8581 Light chain (AL) amyloidosis: Secondary | ICD-10-CM | POA: Insufficient documentation

## 2023-11-27 DIAGNOSIS — Z79899 Other long term (current) drug therapy: Secondary | ICD-10-CM | POA: Diagnosis not present

## 2023-11-27 DIAGNOSIS — Z5112 Encounter for antineoplastic immunotherapy: Secondary | ICD-10-CM | POA: Insufficient documentation

## 2023-11-27 DIAGNOSIS — R7989 Other specified abnormal findings of blood chemistry: Secondary | ICD-10-CM | POA: Insufficient documentation

## 2023-11-27 DIAGNOSIS — E8809 Other disorders of plasma-protein metabolism, not elsewhere classified: Secondary | ICD-10-CM | POA: Diagnosis not present

## 2023-11-27 DIAGNOSIS — I959 Hypotension, unspecified: Secondary | ICD-10-CM | POA: Diagnosis not present

## 2023-11-27 DIAGNOSIS — Z5111 Encounter for antineoplastic chemotherapy: Secondary | ICD-10-CM | POA: Diagnosis not present

## 2023-11-27 DIAGNOSIS — I43 Cardiomyopathy in diseases classified elsewhere: Secondary | ICD-10-CM | POA: Insufficient documentation

## 2023-11-27 LAB — CMP (CANCER CENTER ONLY)
ALT: 70 U/L — ABNORMAL HIGH (ref 0–44)
AST: 63 U/L — ABNORMAL HIGH (ref 15–41)
Albumin: 1.7 g/dL — ABNORMAL LOW (ref 3.5–5.0)
Alkaline Phosphatase: 79 U/L (ref 38–126)
Anion gap: 3 — ABNORMAL LOW (ref 5–15)
BUN: 18 mg/dL (ref 6–20)
CO2: 31 mmol/L (ref 22–32)
Calcium: 7.4 mg/dL — ABNORMAL LOW (ref 8.9–10.3)
Chloride: 104 mmol/L (ref 98–111)
Creatinine: 0.77 mg/dL (ref 0.61–1.24)
GFR, Estimated: >60 mL/min (ref >60)
Glucose, Bld: 124 mg/dL — ABNORMAL HIGH (ref 70–99)
Potassium: 4.1 mmol/L (ref 3.5–5.1)
Sodium: 138 mmol/L (ref 135–145)
Total Bilirubin: 0.4 mg/dL (ref 0.0–1.2)
Total Protein: 3.6 g/dL — ABNORMAL LOW (ref 6.5–8.1)

## 2023-11-27 LAB — CBC WITH DIFFERENTIAL (CANCER CENTER ONLY)
Abs Immature Granulocytes: 0.01 10*3/uL (ref 0.00–0.07)
Basophils Absolute: 0 10*3/uL (ref 0.0–0.1)
Basophils Relative: 0 %
Eosinophils Absolute: 0 10*3/uL (ref 0.0–0.5)
Eosinophils Relative: 1 %
HCT: 41.5 % (ref 39.0–52.0)
Hemoglobin: 14.5 g/dL (ref 13.0–17.0)
Immature Granulocytes: 0 %
Lymphocytes Relative: 24 %
Lymphs Abs: 1.6 10*3/uL (ref 0.7–4.0)
MCH: 32.6 pg (ref 26.0–34.0)
MCHC: 34.9 g/dL (ref 30.0–36.0)
MCV: 93.3 fL (ref 80.0–100.0)
Monocytes Absolute: 0.5 10*3/uL (ref 0.1–1.0)
Monocytes Relative: 8 %
Neutro Abs: 4.3 10*3/uL (ref 1.7–7.7)
Neutrophils Relative %: 67 %
Platelet Count: 172 10*3/uL (ref 150–400)
RBC: 4.45 MIL/uL (ref 4.22–5.81)
RDW: 15.4 % (ref 11.5–15.5)
WBC Count: 6.4 10*3/uL (ref 4.0–10.5)
nRBC: 0 % (ref 0.0–0.2)

## 2023-11-27 MED ORDER — ALBUMIN HUMAN 25 % IV SOLN
25.0000 g | Freq: Once | INTRAVENOUS | Status: AC
  Administered 2023-11-27: 25 g via INTRAVENOUS
  Filled 2023-11-27: qty 100

## 2023-11-27 MED ORDER — SODIUM CHLORIDE 0.9 % IV SOLN
200.0000 mg/m2 | Freq: Once | INTRAVENOUS | Status: AC
  Administered 2023-11-27: 500 mg via INTRAVENOUS
  Filled 2023-11-27: qty 25

## 2023-11-27 MED ORDER — DARATUMUMAB-HYALURONIDASE-FIHJ 1800-30000 MG-UT/15ML ~~LOC~~ SOLN
1800.0000 mg | Freq: Once | SUBCUTANEOUS | Status: AC
  Administered 2023-11-27: 1800 mg via SUBCUTANEOUS
  Filled 2023-11-27: qty 15

## 2023-11-27 MED ORDER — DIPHENHYDRAMINE HCL 25 MG PO CAPS
50.0000 mg | ORAL_CAPSULE | Freq: Once | ORAL | Status: AC
  Administered 2023-11-27: 50 mg via ORAL
  Filled 2023-11-27: qty 2

## 2023-11-27 MED ORDER — SODIUM CHLORIDE 0.9 % IV SOLN: INTRAVENOUS | Status: DC

## 2023-11-27 MED ORDER — DEXAMETHASONE 4 MG PO TABS
20.0000 mg | ORAL_TABLET | Freq: Once | ORAL | Status: AC
  Administered 2023-11-27: 20 mg via ORAL
  Filled 2023-11-27: qty 5

## 2023-11-27 MED ORDER — ALBUMIN HUMAN 25 % IV SOLN: 25.0000 g | Freq: Once | INTRAVENOUS | Status: DC

## 2023-11-27 MED ORDER — PALONOSETRON HCL INJECTION 0.25 MG/5ML
0.2500 mg | Freq: Once | INTRAVENOUS | Status: AC
  Administered 2023-11-27: 0.25 mg via INTRAVENOUS
  Filled 2023-11-27: qty 5

## 2023-11-27 MED ORDER — ACETAMINOPHEN 325 MG PO TABS
650.0000 mg | ORAL_TABLET | Freq: Once | ORAL | Status: AC
  Administered 2023-11-27: 650 mg via ORAL
  Filled 2023-11-27: qty 2

## 2023-11-27 NOTE — Progress Notes (Signed)
 Patient seen by Dr. Kale  Vitals are not all within treatment parameters.  Chronic low BP, pt on meds around the clock, BP 88/59 Dr Salomon Cree aware ( pt taking midodrine )   Labs reviewed: and are within treatment parameters.  Per physician team, patient is ready for treatment and there are NO modifications to the treatment plan. Pt does have increased edema to lower legs and hands, Dr Salomon Cree aware

## 2023-11-27 NOTE — Patient Instructions (Signed)
 [facility] - A DEPT OF [facility]  Discharge Instructions: Thank you for choosing [facility] to provide your oncology and hematology care.   If you have a lab appointment with the Cancer Center, please go directly to the Cancer Center and check in at the registration area.   Wear comfortable clothing and clothing appropriate for easy access to any Portacath or PICC line.   We strive to give you quality time with your provider. You may need to reschedule your appointment if you arrive late (15 or more minutes).  Arriving late affects you and other patients whose appointments are after yours.  Also, if you miss three or more appointments without notifying the office, you may be dismissed from the clinic at the provider's discretion.      For prescription refill requests, have your pharmacy contact our office and allow 72 hours for refills to be completed.    Today you received the following chemotherapy and/or immunotherapy agents: Cyclophosphamide , Darzalex  Faspro      To help prevent nausea and vomiting after your treatment, we encourage you to take your nausea medication as directed.  BELOW ARE SYMPTOMS THAT SHOULD BE REPORTED IMMEDIATELY: *FEVER GREATER THAN 100.4 F (38 C) OR HIGHER *CHILLS OR SWEATING *NAUSEA AND VOMITING THAT IS NOT CONTROLLED WITH YOUR NAUSEA MEDICATION *UNUSUAL SHORTNESS OF BREATH *UNUSUAL BRUISING OR BLEEDING *URINARY PROBLEMS (pain or burning when urinating, or frequent urination) *BOWEL PROBLEMS (unusual diarrhea, constipation, pain near the anus) TENDERNESS IN MOUTH AND THROAT WITH OR WITHOUT PRESENCE OF ULCERS (sore throat, sores in mouth, or a toothache) UNUSUAL RASH, SWELLING OR PAIN  UNUSUAL VAGINAL DISCHARGE OR ITCHING   Items with * indicate a potential emergency and should be followed up as soon as possible or go to the Emergency Department if any problems should occur.  Please show the CHEMOTHERAPY  ALERT CARD or IMMUNOTHERAPY ALERT CARD at check-in to the Emergency Department and triage nurse.  Should you have questions after your visit or need to cancel or reschedule your appointment, please contact [facility]  Dept: [phone]  and follow the prompts.  Office hours are 8:00 a.m. to 4:30 p.m. Monday - Friday. Please note that voicemails left after 4:00 p.m. may not be returned until the following business day.  We are closed weekends and major holidays. You have access to a nurse at all times for urgent questions. Please call the main number to the clinic Dept: [phone] and follow the prompts.   For any non-urgent questions, you may also contact your provider using MyChart. We now offer e-Visits for anyone 82 and older to request care online for non-urgent symptoms. For details visit [email or web address].   Also download the MyChart app! Go to the app store, search "MyChart", open the app, select [place], and log in with your MyChart username and password.

## 2023-11-28 ENCOUNTER — Ambulatory Visit: Attending: Cardiology

## 2023-11-28 ENCOUNTER — Ambulatory Visit (HOSPITAL_COMMUNITY): Payer: Self-pay | Admitting: Cardiology

## 2023-11-28 ENCOUNTER — Other Ambulatory Visit: Payer: Self-pay | Admitting: Cardiology

## 2023-11-28 ENCOUNTER — Telehealth: Payer: Self-pay

## 2023-11-28 ENCOUNTER — Other Ambulatory Visit

## 2023-11-28 DIAGNOSIS — I509 Heart failure, unspecified: Secondary | ICD-10-CM | POA: Diagnosis not present

## 2023-11-28 LAB — ECHOCARDIOGRAM COMPLETE
AV Mean grad: 8 mmHg
AV Peak grad: 13.5 mmHg
Ao pk vel: 1.84 m/s
Est EF: UNDETERMINED
S' Lateral: 4.94 cm
Weight: 4664 [oz_av]

## 2023-11-28 NOTE — Telephone Encounter (Signed)
 Pt wife came in to drop remaining of paper work off  for completion of Cancer Claim to be faxed. I gave pt wife their hard copy in hand. No questions or concerns at this time.Aaron Aas

## 2023-11-29 LAB — ECHOCARDIOGRAM LIMITED
AR max vel: 3.94 cm2
AV Peak grad: 13.8 mmHg
Ao pk vel: 1.86 m/s
Area-P 1/2: 2.36 cm2
Calc EF: 52.4 %
S' Lateral: 3.5 cm
Single Plane A2C EF: 49.7 %
Single Plane A4C EF: 53 %

## 2023-12-01 ENCOUNTER — Encounter: Payer: Self-pay | Admitting: Podiatry

## 2023-12-01 ENCOUNTER — Ambulatory Visit (INDEPENDENT_AMBULATORY_CARE_PROVIDER_SITE_OTHER): Admitting: Podiatry

## 2023-12-01 DIAGNOSIS — B351 Tinea unguium: Secondary | ICD-10-CM

## 2023-12-01 DIAGNOSIS — D689 Coagulation defect, unspecified: Secondary | ICD-10-CM

## 2023-12-01 DIAGNOSIS — M79675 Pain in left toe(s): Secondary | ICD-10-CM | POA: Diagnosis not present

## 2023-12-01 DIAGNOSIS — M79674 Pain in right toe(s): Secondary | ICD-10-CM | POA: Diagnosis not present

## 2023-12-01 NOTE — Progress Notes (Unsigned)
 Patient currently receiving chemotherapy for Amaloidoysis Myeloma.

## 2023-12-01 NOTE — Progress Notes (Signed)
 This patient presents to the office with chief complaint of long thick painful nails.  Patient says the nails are painful walking and wearing shoes.  This patient is unable to self treat.  This patient is unable to trim her nails since he is unable to reach her nails.   Patient has history of TIA for which he takes plavix . he presents to the office for preventative foot care services.  General Appearance  Alert, conversant and in no acute stress.  Vascular  Dorsalis pedis and posterior tibial  pulses are  non palpable  due to swelling  bilaterally.  Capillary return is within normal limits  bilaterally. Temperature is within normal limits  bilaterally.  Neurologic  Senn-Weinstein monofilament wire test within normal limits  bilaterally. Muscle power within normal limits bilaterally.  Nails Thick disfigured discolored nails with subungual debris  from hallux to fifth toes bilaterally. No evidence of bacterial infection or drainage bilaterally.  Orthopedic  No limitations of motion  feet .  No crepitus or effusions noted.  No bony pathology or digital deformities noted.  Skin  normotropic skin with no porokeratosis noted bilaterally.  No signs of infections or ulcers noted.     Onychomycosis  Nails  B/L.  Pain in right toes  Pain in left toes  Debridement of nails both feet followed trimming the nails with dremel tool.    RTC 3 months.   Ruffin Cotton DPM

## 2023-12-02 ENCOUNTER — Other Ambulatory Visit: Payer: Self-pay

## 2023-12-03 ENCOUNTER — Encounter: Payer: Self-pay | Admitting: Hematology

## 2023-12-04 ENCOUNTER — Encounter: Payer: Self-pay | Admitting: Hematology

## 2023-12-05 ENCOUNTER — Inpatient Hospital Stay

## 2023-12-05 ENCOUNTER — Inpatient Hospital Stay (HOSPITAL_BASED_OUTPATIENT_CLINIC_OR_DEPARTMENT_OTHER): Admitting: Physician Assistant

## 2023-12-05 VITALS — BP 142/55 | HR 66 | Temp 97.5°F | Resp 18 | Ht 72.0 in | Wt 289.0 lb

## 2023-12-05 VITALS — BP 117/88 | HR 76 | Temp 97.6°F | Resp 17

## 2023-12-05 DIAGNOSIS — C9000 Multiple myeloma not having achieved remission: Secondary | ICD-10-CM

## 2023-12-05 DIAGNOSIS — E8809 Other disorders of plasma-protein metabolism, not elsewhere classified: Secondary | ICD-10-CM

## 2023-12-05 DIAGNOSIS — Z79899 Other long term (current) drug therapy: Secondary | ICD-10-CM | POA: Diagnosis not present

## 2023-12-05 DIAGNOSIS — Z5111 Encounter for antineoplastic chemotherapy: Secondary | ICD-10-CM | POA: Diagnosis not present

## 2023-12-05 DIAGNOSIS — R7989 Other specified abnormal findings of blood chemistry: Secondary | ICD-10-CM | POA: Diagnosis not present

## 2023-12-05 DIAGNOSIS — I43 Cardiomyopathy in diseases classified elsewhere: Secondary | ICD-10-CM | POA: Diagnosis not present

## 2023-12-05 DIAGNOSIS — E8581 Light chain (AL) amyloidosis: Secondary | ICD-10-CM

## 2023-12-05 DIAGNOSIS — I509 Heart failure, unspecified: Secondary | ICD-10-CM | POA: Diagnosis not present

## 2023-12-05 DIAGNOSIS — Z5112 Encounter for antineoplastic immunotherapy: Secondary | ICD-10-CM | POA: Diagnosis not present

## 2023-12-05 DIAGNOSIS — I959 Hypotension, unspecified: Secondary | ICD-10-CM | POA: Diagnosis not present

## 2023-12-05 LAB — CBC WITH DIFFERENTIAL (CANCER CENTER ONLY)
Abs Immature Granulocytes: 0.02 10*3/uL (ref 0.00–0.07)
Basophils Absolute: 0 10*3/uL (ref 0.0–0.1)
Basophils Relative: 1 %
Eosinophils Absolute: 0.1 10*3/uL (ref 0.0–0.5)
Eosinophils Relative: 1 %
HCT: 40.8 % (ref 39.0–52.0)
Hemoglobin: 14 g/dL (ref 13.0–17.0)
Immature Granulocytes: 0 %
Lymphocytes Relative: 28 %
Lymphs Abs: 1.7 10*3/uL (ref 0.7–4.0)
MCH: 32.4 pg (ref 26.0–34.0)
MCHC: 34.3 g/dL (ref 30.0–36.0)
MCV: 94.4 fL (ref 80.0–100.0)
Monocytes Absolute: 0.6 10*3/uL (ref 0.1–1.0)
Monocytes Relative: 9 %
Neutro Abs: 3.8 10*3/uL (ref 1.7–7.7)
Neutrophils Relative %: 61 %
Platelet Count: 165 10*3/uL (ref 150–400)
RBC: 4.32 MIL/uL (ref 4.22–5.81)
RDW: 14.9 % (ref 11.5–15.5)
WBC Count: 6.2 10*3/uL (ref 4.0–10.5)
nRBC: 0 % (ref 0.0–0.2)

## 2023-12-05 LAB — CMP (CANCER CENTER ONLY)
ALT: 69 U/L — ABNORMAL HIGH (ref 0–44)
AST: 58 U/L — ABNORMAL HIGH (ref 15–41)
Albumin: 1.7 g/dL — ABNORMAL LOW (ref 3.5–5.0)
Alkaline Phosphatase: 70 U/L (ref 38–126)
Anion gap: 2 — ABNORMAL LOW (ref 5–15)
BUN: 18 mg/dL (ref 6–20)
CO2: 34 mmol/L — ABNORMAL HIGH (ref 22–32)
Calcium: 7.4 mg/dL — ABNORMAL LOW (ref 8.9–10.3)
Chloride: 105 mmol/L (ref 98–111)
Creatinine: 0.67 mg/dL (ref 0.61–1.24)
GFR, Estimated: >60 mL/min (ref >60)
Glucose, Bld: 102 mg/dL — ABNORMAL HIGH (ref 70–99)
Potassium: 3.9 mmol/L (ref 3.5–5.1)
Sodium: 141 mmol/L (ref 135–145)
Total Bilirubin: 0.3 mg/dL (ref 0.0–1.2)
Total Protein: 3.5 g/dL — ABNORMAL LOW (ref 6.5–8.1)

## 2023-12-05 MED ORDER — PALONOSETRON HCL INJECTION 0.25 MG/5ML
0.2500 mg | Freq: Once | INTRAVENOUS | Status: AC
  Administered 2023-12-05: 0.25 mg via INTRAVENOUS
  Filled 2023-12-05: qty 5

## 2023-12-05 MED ORDER — DIPHENHYDRAMINE HCL 25 MG PO CAPS
50.0000 mg | ORAL_CAPSULE | Freq: Once | ORAL | Status: AC
  Administered 2023-12-05: 50 mg via ORAL
  Filled 2023-12-05: qty 2

## 2023-12-05 MED ORDER — SODIUM CHLORIDE 0.9 % IV SOLN
200.0000 mg/m2 | Freq: Once | INTRAVENOUS | Status: AC
  Administered 2023-12-05: 500 mg via INTRAVENOUS
  Filled 2023-12-05: qty 25

## 2023-12-05 MED ORDER — ALBUMIN HUMAN 25 % IV SOLN
25.0000 g | Freq: Once | INTRAVENOUS | Status: AC
  Administered 2023-12-05: 25 g via INTRAVENOUS
  Filled 2023-12-05: qty 100

## 2023-12-05 MED ORDER — ACETAMINOPHEN 325 MG PO TABS
650.0000 mg | ORAL_TABLET | Freq: Once | ORAL | Status: AC
  Administered 2023-12-05: 650 mg via ORAL
  Filled 2023-12-05: qty 2

## 2023-12-05 MED ORDER — DARATUMUMAB-HYALURONIDASE-FIHJ 1800-30000 MG-UT/15ML ~~LOC~~ SOLN
1800.0000 mg | Freq: Once | SUBCUTANEOUS | Status: AC
  Administered 2023-12-05: 1800 mg via SUBCUTANEOUS
  Filled 2023-12-05: qty 15

## 2023-12-05 MED ORDER — DEXAMETHASONE 4 MG PO TABS
20.0000 mg | ORAL_TABLET | Freq: Once | ORAL | Status: AC
  Administered 2023-12-05: 20 mg via ORAL
  Filled 2023-12-05: qty 5

## 2023-12-05 NOTE — Patient Instructions (Signed)
 Albumin  Injection What is this medication? ALBUMIN  (al BYOO min) treats low blood volume, which may occur with severe dehydration or bleeding. It works by increasing blood volume so your heart can pump blood to the rest of your body. It may also be used to treat low albumin  levels caused by surgery, infection, or other health conditions. Albumin  is a protein that helps your body balance the level of fluid in your blood vessels. This helps maintain a healthy blood pressure and prevents swelling or edema. This medicine may be used for other purposes; ask your health care provider or pharmacist if you have questions. COMMON BRAND NAME(S): Albuked , Albumarc, Albuminar, Albuminex , AlbuRx , Albutein , Buminate, Flexbumin , Kedbumin , Macrotec, Plasbumin , Plasbumin -20 What should I tell my care team before I take this medication? They need to know if you have any of these conditions: Heart disease Kidney disease Low red blood cell levels (anemia) An unusual or allergic reaction to albumin , other medications, foods, dyes, or preservatives Pregnant or trying to get pregnant Breastfeeding How should I use this medication? This medication is infused into a vein. It is given by your care team in a hospital or clinic setting. Talk to your care team about the use of this medication in children. While it may be given to children for selected conditions, precautions do apply. Overdosage: If you think you have taken too much of this medicine contact a poison control center or emergency room at once. NOTE: This medicine is only for you. Do not share this medicine with others. What if I miss a dose? This does not apply. What may interact with this medication? Interactions are not expected. This list may not describe all possible interactions. Give your health care provider a list of all the medicines, herbs, non-prescription drugs, or dietary supplements you use. Also tell them if you smoke, drink alcohol, or use  illegal drugs. Some items may interact with your medicine. What should I watch for while using this medication? Your condition will be monitored carefully while you are receiving this medication. This product is derived from human plasma. Talk to your care team about the risks and benefits of this medication. What side effects may I notice from receiving this medication? Side effects that you should report to your care team as soon as possible: Allergic reactions--skin rash, itching, hives, swelling of the face, lips, tongue, or throat Heart failure--shortness of breath, swelling of the ankles, feet, or hands, sudden weight gain, unusual weakness or fatigue Increase in blood pressure Shortness of breath or trouble breathing, cough, unusual weakness or fatigue, blue skin or lips Side effects that usually do not require medical attention (report these to your care team if they continue or are bothersome): Flushing Headache Nausea This list may not describe all possible side effects. Call your doctor for medical advice about side effects. You may report side effects to FDA at 1-800-FDA-1088. Where should I keep my medication? This medication is given in a hospital or clinic. It will not be stored at home. NOTE: This sheet is a summary. It may not cover all possible information. If you have questions about this medicine, talk to your doctor, pharmacist, or health care provider. [facility] - A DEPT OF [facility]  Discharge Instructions: Thank you for choosing [facility] to provide your oncology and hematology care.   If you have a lab appointment with the Cancer Center, please go directly to the Cancer Center and check  in at the registration area.   Wear comfortable clothing and clothing appropriate for easy access to any Portacath or PICC line.   We strive to give you quality time with your provider. You may need to reschedule your appointment if  you arrive late (15 or more minutes).  Arriving late affects you and other patients whose appointments are after yours.  Also, if you miss three or more appointments without notifying the office, you may be dismissed from the clinic at the provider's discretion.      For prescription refill requests, have your pharmacy contact our office and allow 72 hours for refills to be completed.    Today you received the following chemotherapy and/or immunotherapy agents: Cytoxan , Dara Faspro.       To help prevent nausea and vomiting after your treatment, we encourage you to take your nausea medication as directed.  BELOW ARE SYMPTOMS THAT SHOULD BE REPORTED IMMEDIATELY: *FEVER GREATER THAN 100.4 F (38 C) OR HIGHER *CHILLS OR SWEATING *NAUSEA AND VOMITING THAT IS NOT CONTROLLED WITH YOUR NAUSEA MEDICATION *UNUSUAL SHORTNESS OF BREATH *UNUSUAL BRUISING OR BLEEDING *URINARY PROBLEMS (pain or burning when urinating, or frequent urination) *BOWEL PROBLEMS (unusual diarrhea, constipation, pain near the anus) TENDERNESS IN MOUTH AND THROAT WITH OR WITHOUT PRESENCE OF ULCERS (sore throat, sores in mouth, or a toothache) UNUSUAL RASH, SWELLING OR PAIN  UNUSUAL VAGINAL DISCHARGE OR ITCHING   Items with * indicate a potential emergency and should be followed up as soon as possible or go to the Emergency Department if any problems should occur.  Please show the CHEMOTHERAPY ALERT CARD or IMMUNOTHERAPY ALERT CARD at check-in to the Emergency Department and triage nurse.  Should you have questions after your visit or need to cancel or reschedule your appointment, please contact [facility] - A DEPT [facility]  Dept: [phone]  and follow the prompts.  Office hours are 8:00 a.m. to 4:30 p.m. Monday - Friday. Please note that voicemails left after 4:00 p.m. may not be returned until the following business day.  We are closed weekends and major holidays. You have access to a  nurse at all times for urgent questions. Please call the main number to the clinic Dept: [phone] and follow the prompts.   For any non-urgent questions, you may also contact your provider using MyChart. We now offer e-Visits for anyone 62 and older to request care online for non-urgent symptoms. For details visit [email or web address].   Also download the MyChart app! Go to the app store, search MyChart, open the app, select [place], and log in with your MyChart username and password.    2024 [employer]/Gold Standard (2023-05-19 00:00:00)
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
## 2023-12-06 DIAGNOSIS — D696 Thrombocytopenia, unspecified: Secondary | ICD-10-CM | POA: Diagnosis not present

## 2023-12-06 DIAGNOSIS — E1122 Type 2 diabetes mellitus with diabetic chronic kidney disease: Secondary | ICD-10-CM | POA: Diagnosis not present

## 2023-12-06 DIAGNOSIS — E039 Hypothyroidism, unspecified: Secondary | ICD-10-CM | POA: Diagnosis not present

## 2023-12-06 DIAGNOSIS — E7849 Other hyperlipidemia: Secondary | ICD-10-CM | POA: Diagnosis not present

## 2023-12-06 LAB — KAPPA/LAMBDA LIGHT CHAINS
Kappa free light chain: 7.6 mg/L (ref 3.3–19.4)
Kappa, lambda light chain ratio: 0.23 — ABNORMAL LOW (ref 0.26–1.65)
Lambda free light chains: 33 mg/L — ABNORMAL HIGH (ref 5.7–26.3)

## 2023-12-07 ENCOUNTER — Telehealth (HOSPITAL_COMMUNITY): Payer: Self-pay | Admitting: Pharmacist

## 2023-12-07 ENCOUNTER — Other Ambulatory Visit: Payer: Self-pay

## 2023-12-07 NOTE — Telephone Encounter (Signed)
 Patient Advocate Encounter   Received notification from [employer] that prior authorization for droxidopa  is required.   PA submitted on CoverMyMeds Key [id number] Status is pending   Will continue to follow.   Luster Salters, PharmD, BCPS, BCCP, CPP Heart Failure Clinic Pharmacist [phone]

## 2023-12-07 NOTE — Telephone Encounter (Signed)
 Advanced Heart Failure Patient Advocate Encounter  Prior Authorization for Droxidopa  has been approved.    PA# [id number]. Effective dates: 12/07/23 through 06/19/2024  Luster Salters, PharmD, BCPS, BCCP, CPP Heart Failure Clinic Pharmacist [phone]

## 2023-12-08 LAB — MULTIPLE MYELOMA PANEL, SERUM
Albumin SerPl Elph-Mcnc: 1.3 g/dL — ABNORMAL LOW (ref 2.9–4.4)
Albumin/Glob SerPl: 0.8 (ref 0.7–1.7)
Alpha 1: 0.1 g/dL (ref 0.0–0.4)
Alpha2 Glob SerPl Elph-Mcnc: 1.1 g/dL — ABNORMAL HIGH (ref 0.4–1.0)
B-Globulin SerPl Elph-Mcnc: 0.6 g/dL — ABNORMAL LOW (ref 0.7–1.3)
Gamma Glob SerPl Elph-Mcnc: 0.1 g/dL — ABNORMAL LOW (ref 0.4–1.8)
Globulin, Total: 1.8 g/dL — ABNORMAL LOW (ref 2.2–3.9)
IgA: 35 mg/dL — ABNORMAL LOW (ref 90–386)
IgG (Immunoglobin G), Serum: 155 mg/dL — ABNORMAL LOW (ref 603–1613)
IgM (Immunoglobulin M), Srm: 46 mg/dL (ref 20–172)
Total Protein ELP: 3.1 g/dL — ABNORMAL LOW (ref 6.0–8.5)
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
## 2023-12-11 ENCOUNTER — Inpatient Hospital Stay (HOSPITAL_BASED_OUTPATIENT_CLINIC_OR_DEPARTMENT_OTHER): Admitting: Hematology

## 2023-12-11 ENCOUNTER — Inpatient Hospital Stay

## 2023-12-11 ENCOUNTER — Other Ambulatory Visit: Payer: Self-pay

## 2023-12-11 VITALS — BP 86/59 | HR 72 | Temp 97.2°F | Resp 20 | Wt 294.2 lb

## 2023-12-11 VITALS — BP 105/68 | HR 62 | Resp 18

## 2023-12-11 DIAGNOSIS — E8581 Light chain (AL) amyloidosis: Secondary | ICD-10-CM

## 2023-12-11 DIAGNOSIS — Z79899 Other long term (current) drug therapy: Secondary | ICD-10-CM | POA: Diagnosis not present

## 2023-12-11 DIAGNOSIS — Z5112 Encounter for antineoplastic immunotherapy: Secondary | ICD-10-CM | POA: Diagnosis not present

## 2023-12-11 DIAGNOSIS — C9000 Multiple myeloma not having achieved remission: Secondary | ICD-10-CM

## 2023-12-11 DIAGNOSIS — I959 Hypotension, unspecified: Secondary | ICD-10-CM

## 2023-12-11 DIAGNOSIS — I43 Cardiomyopathy in diseases classified elsewhere: Secondary | ICD-10-CM | POA: Diagnosis not present

## 2023-12-11 DIAGNOSIS — E8809 Other disorders of plasma-protein metabolism, not elsewhere classified: Secondary | ICD-10-CM | POA: Diagnosis not present

## 2023-12-11 DIAGNOSIS — D8989 Other specified disorders involving the immune mechanism, not elsewhere classified: Secondary | ICD-10-CM

## 2023-12-11 DIAGNOSIS — Z5111 Encounter for antineoplastic chemotherapy: Secondary | ICD-10-CM | POA: Diagnosis not present

## 2023-12-11 DIAGNOSIS — I509 Heart failure, unspecified: Secondary | ICD-10-CM | POA: Diagnosis not present

## 2023-12-11 DIAGNOSIS — N049 Nephrotic syndrome with unspecified morphologic changes: Secondary | ICD-10-CM

## 2023-12-11 DIAGNOSIS — R7989 Other specified abnormal findings of blood chemistry: Secondary | ICD-10-CM | POA: Diagnosis not present

## 2023-12-11 LAB — CMP (CANCER CENTER ONLY)
ALT: 76 U/L — ABNORMAL HIGH (ref 0–44)
AST: 55 U/L — ABNORMAL HIGH (ref 15–41)
Albumin: 1.7 g/dL — ABNORMAL LOW (ref 3.5–5.0)
Alkaline Phosphatase: 77 U/L (ref 38–126)
Anion gap: 1 — ABNORMAL LOW (ref 5–15)
BUN: 23 mg/dL — ABNORMAL HIGH (ref 6–20)
CO2: 34 mmol/L — ABNORMAL HIGH (ref 22–32)
Calcium: 7.4 mg/dL — ABNORMAL LOW (ref 8.9–10.3)
Chloride: 105 mmol/L (ref 98–111)
Creatinine: 0.72 mg/dL (ref 0.61–1.24)
GFR, Estimated: >60 mL/min (ref >60)
Glucose, Bld: 118 mg/dL — ABNORMAL HIGH (ref 70–99)
Potassium: 4.2 mmol/L (ref 3.5–5.1)
Sodium: 140 mmol/L (ref 135–145)
Total Bilirubin: 0.2 mg/dL (ref 0.0–1.2)
Total Protein: 3.6 g/dL — ABNORMAL LOW (ref 6.5–8.1)

## 2023-12-11 LAB — CBC WITH DIFFERENTIAL (CANCER CENTER ONLY)
Abs Immature Granulocytes: 0.02 10*3/uL (ref 0.00–0.07)
Basophils Absolute: 0 10*3/uL (ref 0.0–0.1)
Basophils Relative: 0 %
Eosinophils Absolute: 0.1 10*3/uL (ref 0.0–0.5)
Eosinophils Relative: 1 %
HCT: 42.5 % (ref 39.0–52.0)
Hemoglobin: 14.5 g/dL (ref 13.0–17.0)
Immature Granulocytes: 0 %
Lymphocytes Relative: 25 %
Lymphs Abs: 2 10*3/uL (ref 0.7–4.0)
MCH: 32.6 pg (ref 26.0–34.0)
MCHC: 34.1 g/dL (ref 30.0–36.0)
MCV: 95.5 fL (ref 80.0–100.0)
Monocytes Absolute: 0.5 10*3/uL (ref 0.1–1.0)
Monocytes Relative: 6 %
Neutro Abs: 5.3 10*3/uL (ref 1.7–7.7)
Neutrophils Relative %: 68 %
Platelet Count: 168 10*3/uL (ref 150–400)
RBC: 4.45 MIL/uL (ref 4.22–5.81)
RDW: 15 % (ref 11.5–15.5)
WBC Count: 7.8 10*3/uL (ref 4.0–10.5)
nRBC: 0 % (ref 0.0–0.2)

## 2023-12-11 MED ORDER — DARATUMUMAB-HYALURONIDASE-FIHJ 1800-30000 MG-UT/15ML ~~LOC~~ SOLN
1800.0000 mg | Freq: Once | SUBCUTANEOUS | Status: AC
  Administered 2023-12-11: 1800 mg via SUBCUTANEOUS
  Filled 2023-12-11: qty 15

## 2023-12-11 MED ORDER — DEXAMETHASONE 4 MG PO TABS
20.0000 mg | ORAL_TABLET | Freq: Once | ORAL | Status: AC
  Administered 2023-12-11: 20 mg via ORAL
  Filled 2023-12-11: qty 5

## 2023-12-11 MED ORDER — ACETAMINOPHEN 325 MG PO TABS
650.0000 mg | ORAL_TABLET | Freq: Once | ORAL | Status: AC
  Administered 2023-12-11: 650 mg via ORAL
  Filled 2023-12-11: qty 2

## 2023-12-11 MED ORDER — DIPHENHYDRAMINE HCL 25 MG PO CAPS
50.0000 mg | ORAL_CAPSULE | Freq: Once | ORAL | Status: AC
  Administered 2023-12-11: 50 mg via ORAL
  Filled 2023-12-11: qty 2

## 2023-12-11 MED ORDER — ALBUMIN HUMAN 25 % IV SOLN
25.0000 g | Freq: Once | INTRAVENOUS | Status: AC
  Administered 2023-12-11: 25 g via INTRAVENOUS
  Filled 2023-12-11: qty 100

## 2023-12-11 MED ORDER — SODIUM CHLORIDE 0.9 % IV SOLN
200.0000 mg/m2 | Freq: Once | INTRAVENOUS | Status: AC
  Administered 2023-12-11: 500 mg via INTRAVENOUS
  Filled 2023-12-11: qty 25

## 2023-12-11 MED ORDER — PALONOSETRON HCL INJECTION 0.25 MG/5ML
0.2500 mg | Freq: Once | INTRAVENOUS | Status: AC
  Administered 2023-12-11: 0.25 mg via INTRAVENOUS
  Filled 2023-12-11: qty 5

## 2023-12-11 NOTE — Progress Notes (Signed)
 Specialty Pharmacy Refill Coordination Note  Arjan Strohm. is a 58 y.o. male contacted today regarding refills of specialty medication(s) Droxidopa  (NORTHERA )   Patient requested [name] at [facility] [place] date: 12/13/23   Medication will be filled on 12/13/23.

## 2023-12-11 NOTE — Patient Instructions (Signed)
 [facility] - A DEPT OF [facility]  Discharge Instructions: Thank you for choosing [facility] to provide your oncology and hematology care.   If you have a lab appointment with the Cancer Center, please go directly to the Cancer Center and check in at the registration area.   Wear comfortable clothing and clothing appropriate for easy access to any Portacath or PICC line.   We strive to give you quality time with your provider. You may need to reschedule your appointment if you arrive late (15 or more minutes).  Arriving late affects you and other patients whose appointments are after yours.  Also, if you miss three or more appointments without notifying the office, you may be dismissed from the clinic at the provider's discretion.      For prescription refill requests, have your pharmacy contact our office and allow 72 hours for refills to be completed.    Today you received the following chemotherapy and/or immunotherapy agents Cytoxan , Darzalex  Faspro      To help prevent nausea and vomiting after your treatment, we encourage you to take your nausea medication as directed.  BELOW ARE SYMPTOMS THAT SHOULD BE REPORTED IMMEDIATELY: *FEVER GREATER THAN 100.4 F (38 C) OR HIGHER *CHILLS OR SWEATING *NAUSEA AND VOMITING THAT IS NOT CONTROLLED WITH YOUR NAUSEA MEDICATION *UNUSUAL SHORTNESS OF BREATH *UNUSUAL BRUISING OR BLEEDING *URINARY PROBLEMS (pain or burning when urinating, or frequent urination) *BOWEL PROBLEMS (unusual diarrhea, constipation, pain near the anus) TENDERNESS IN MOUTH AND THROAT WITH OR WITHOUT PRESENCE OF ULCERS (sore throat, sores in mouth, or a toothache) UNUSUAL RASH, SWELLING OR PAIN  UNUSUAL VAGINAL DISCHARGE OR ITCHING   Items with * indicate a potential emergency and should be followed up as soon as possible or go to the Emergency Department if any problems should occur.  Please show the CHEMOTHERAPY ALERT CARD or  IMMUNOTHERAPY ALERT CARD at check-in to the Emergency Department and triage nurse.  Should you have questions after your visit or need to cancel or reschedule your appointment, please contact [facility] - A DEPT OF [facility]  Dept: [phone]  and follow the prompts.  Office hours are 8:00 a.m. to 4:30 p.m. Monday - Friday. Please note that voicemails left after 4:00 p.m. may not be returned until the following business day.  We are closed weekends and major holidays. You have access to a nurse at all times for urgent questions. Please call the main number to the clinic Dept: [phone] and follow the prompts.   For any non-urgent questions, you may also contact your provider using MyChart. We now offer e-Visits for anyone 75 and older to request care online for non-urgent symptoms. For details visit [email or web address].   Also download the MyChart app! Go to the app store, search MyChart, open the app, select [place], and log in with your MyChart username and password.  Albumin  Injection What is this medication? ALBUMIN  (al BYOO min) treats low blood volume, which may occur with severe dehydration or bleeding. It works by increasing blood volume so your heart can pump blood to the rest of your body. It may also be used to treat low albumin  levels caused by surgery, infection, or other health conditions. Albumin  is a protein that helps your body balance the level of fluid in your blood vessels. This helps maintain a healthy blood pressure and prevents swelling or edema. This medicine may  be used for other purposes; ask your health care provider or pharmacist if you have questions. COMMON BRAND NAME(S): Albuked , Albumarc, Albuminar, Albuminex , AlbuRx , Albutein , Buminate, Flexbumin , Kedbumin , Macrotec, Plasbumin , Plasbumin -20 What should I tell my care team before I take this medication? They need to know if you have any of these conditions: Heart  disease Kidney disease Low red blood cell levels (anemia) An unusual or allergic reaction to albumin , other medications, foods, dyes, or preservatives Pregnant or trying to get pregnant Breastfeeding How should I use this medication? This medication is infused into a vein. It is given by your care team in a hospital or clinic setting. Talk to your care team about the use of this medication in children. While it may be given to children for selected conditions, precautions do apply. Overdosage: If you think you have taken too much of this medicine contact a poison control center or emergency room at once. NOTE: This medicine is only for you. Do not share this medicine with others. What if I miss a dose? This does not apply. What may interact with this medication? Interactions are not expected. This list may not describe all possible interactions. Give your health care provider a list of all the medicines, herbs, non-prescription drugs, or dietary supplements you use. Also tell them if you smoke, drink alcohol, or use illegal drugs. Some items may interact with your medicine. What should I watch for while using this medication? Your condition will be monitored carefully while you are receiving this medication. This product is derived from human plasma. Talk to your care team about the risks and benefits of this medication. What side effects may I notice from receiving this medication? Side effects that you should report to your care team as soon as possible: Allergic reactions--skin rash, itching, hives, swelling of the face, lips, tongue, or throat Heart failure--shortness of breath, swelling of the ankles, feet, or hands, sudden weight gain, unusual weakness or fatigue Increase in blood pressure Shortness of breath or trouble breathing, cough, unusual weakness or fatigue, blue skin or lips Side effects that usually do not require medical attention (report these to your care team if they  continue or are bothersome): Flushing Headache Nausea This list may not describe all possible side effects. Call your doctor for medical advice about side effects. You may report side effects to FDA at 1-800-FDA-1088. Where should I keep my medication? This medication is given in a hospital or clinic. It will not be stored at home. NOTE: This sheet is a summary. It may not cover all possible information. If you have questions about this medicine, talk to your doctor, pharmacist, or health care provider.  2024 Elsevier/Gold Standard (2023-05-19 00:00:00)

## 2023-12-12 ENCOUNTER — Other Ambulatory Visit (HOSPITAL_COMMUNITY): Payer: Self-pay

## 2023-12-12 ENCOUNTER — Other Ambulatory Visit: Payer: Self-pay

## 2023-12-12 DIAGNOSIS — E1122 Type 2 diabetes mellitus with diabetic chronic kidney disease: Secondary | ICD-10-CM | POA: Diagnosis not present

## 2023-12-12 DIAGNOSIS — D696 Thrombocytopenia, unspecified: Secondary | ICD-10-CM | POA: Diagnosis not present

## 2023-12-12 DIAGNOSIS — E782 Mixed hyperlipidemia: Secondary | ICD-10-CM | POA: Diagnosis not present

## 2023-12-12 DIAGNOSIS — E039 Hypothyroidism, unspecified: Secondary | ICD-10-CM | POA: Diagnosis not present

## 2023-12-14 ENCOUNTER — Other Ambulatory Visit (HOSPITAL_COMMUNITY): Payer: Self-pay

## 2023-12-15 ENCOUNTER — Telehealth (HOSPITAL_COMMUNITY): Payer: Self-pay

## 2023-12-15 NOTE — Telephone Encounter (Signed)
 Received call from patient's wife who states that patient has been experiencing swelling and weight gain. Patients wife reports that patients weight was 276 when he started chemo back on May 8th but states that the last few days his weight is up to 296-300lbs. Reports that his lower extremity swelling is significant. Patient has been getting chemo weekly and has required albumin  to support BP during this. Also requires midodrine  & droxidopa  for BP support.  Reports that patients oncologist recommended IV lasix  therapy- in the hospital. Wanted to know if Dr. Zenaida would help to arrange direct admit to monitor this. Patients wife does state that patient has been receiving Furosemide  20mg  3-4 times weekly but this is not on medication list.   She does also want to let Dr. Zenaida know that patients kappa/lambda light chains are down to 33 from 88 with chemo.   Advised patients wife I would forward message over to provider for review and advice on what to do from here.

## 2023-12-15 NOTE — Telephone Encounter (Signed)
 Spoke with Dr. Zenaida- he recommends seeing patient on Monday to evaluate. Patient scheduled for Monday after noon.   Spoke with patients wife and made her aware, she verbalized understanding. Patient has chemo/apt at 1115 then will come over to clinic after infusion.    Advised patients wife to let us  know if any issues, questions, or concerns and she verbalized understanding.

## 2023-12-18 ENCOUNTER — Inpatient Hospital Stay (HOSPITAL_COMMUNITY)
Admission: AD | Admit: 2023-12-18 | Discharge: 2023-12-21 | DRG: 292 | Disposition: A | Discharge status: Home / Self Care | Source: Ambulatory Visit | Attending: Cardiology | Admitting: Cardiology

## 2023-12-18 ENCOUNTER — Encounter (HOSPITAL_COMMUNITY): Payer: Self-pay | Admitting: Cardiology

## 2023-12-18 ENCOUNTER — Encounter (HOSPITAL_COMMUNITY): Admitting: Cardiology

## 2023-12-18 ENCOUNTER — Inpatient Hospital Stay

## 2023-12-18 ENCOUNTER — Inpatient Hospital Stay (HOSPITAL_COMMUNITY)

## 2023-12-18 ENCOUNTER — Inpatient Hospital Stay: Admitting: Hematology

## 2023-12-18 ENCOUNTER — Encounter: Payer: Self-pay | Admitting: Hematology

## 2023-12-18 ENCOUNTER — Other Ambulatory Visit: Payer: Self-pay

## 2023-12-18 ENCOUNTER — Encounter (HOSPITAL_COMMUNITY): Payer: Self-pay

## 2023-12-18 VITALS — BP 114/78 | HR 58 | Temp 97.7°F | Resp 18 | Wt 295.5 lb

## 2023-12-18 DIAGNOSIS — Z7982 Long term (current) use of aspirin: Secondary | ICD-10-CM | POA: Diagnosis not present

## 2023-12-18 DIAGNOSIS — I951 Orthostatic hypotension: Secondary | ICD-10-CM | POA: Diagnosis not present

## 2023-12-18 DIAGNOSIS — Z96642 Presence of left artificial hip joint: Secondary | ICD-10-CM | POA: Diagnosis not present

## 2023-12-18 DIAGNOSIS — Z833 Family history of diabetes mellitus: Secondary | ICD-10-CM | POA: Diagnosis not present

## 2023-12-18 DIAGNOSIS — E8581 Light chain (AL) amyloidosis: Secondary | ICD-10-CM | POA: Diagnosis present

## 2023-12-18 DIAGNOSIS — G8929 Other chronic pain: Secondary | ICD-10-CM | POA: Diagnosis not present

## 2023-12-18 DIAGNOSIS — Z79899 Other long term (current) drug therapy: Secondary | ICD-10-CM | POA: Diagnosis not present

## 2023-12-18 DIAGNOSIS — C9000 Multiple myeloma not having achieved remission: Secondary | ICD-10-CM | POA: Diagnosis not present

## 2023-12-18 DIAGNOSIS — E859 Amyloidosis, unspecified: Secondary | ICD-10-CM

## 2023-12-18 DIAGNOSIS — E039 Hypothyroidism, unspecified: Secondary | ICD-10-CM | POA: Diagnosis present

## 2023-12-18 DIAGNOSIS — I959 Hypotension, unspecified: Secondary | ICD-10-CM

## 2023-12-18 DIAGNOSIS — I509 Heart failure, unspecified: Secondary | ICD-10-CM | POA: Diagnosis not present

## 2023-12-18 DIAGNOSIS — Z8051 Family history of malignant neoplasm of kidney: Secondary | ICD-10-CM

## 2023-12-18 DIAGNOSIS — N049 Nephrotic syndrome with unspecified morphologic changes: Secondary | ICD-10-CM | POA: Diagnosis not present

## 2023-12-18 DIAGNOSIS — Z951 Presence of aortocoronary bypass graft: Secondary | ICD-10-CM

## 2023-12-18 DIAGNOSIS — I110 Hypertensive heart disease with heart failure: Principal | ICD-10-CM | POA: Diagnosis present

## 2023-12-18 DIAGNOSIS — I4891 Unspecified atrial fibrillation: Secondary | ICD-10-CM

## 2023-12-18 DIAGNOSIS — R0989 Other specified symptoms and signs involving the circulatory and respiratory systems: Secondary | ICD-10-CM | POA: Diagnosis not present

## 2023-12-18 DIAGNOSIS — Z7902 Long term (current) use of antithrombotics/antiplatelets: Secondary | ICD-10-CM

## 2023-12-18 DIAGNOSIS — J90 Pleural effusion, not elsewhere classified: Secondary | ICD-10-CM | POA: Diagnosis not present

## 2023-12-18 DIAGNOSIS — E785 Hyperlipidemia, unspecified: Secondary | ICD-10-CM | POA: Diagnosis not present

## 2023-12-18 DIAGNOSIS — Z8349 Family history of other endocrine, nutritional and metabolic diseases: Secondary | ICD-10-CM

## 2023-12-18 DIAGNOSIS — Z8249 Family history of ischemic heart disease and other diseases of the circulatory system: Secondary | ICD-10-CM

## 2023-12-18 DIAGNOSIS — Z8673 Personal history of transient ischemic attack (TIA), and cerebral infarction without residual deficits: Secondary | ICD-10-CM

## 2023-12-18 DIAGNOSIS — I5033 Acute on chronic diastolic (congestive) heart failure: Principal | ICD-10-CM | POA: Diagnosis present

## 2023-12-18 DIAGNOSIS — Z79891 Long term (current) use of opiate analgesic: Secondary | ICD-10-CM

## 2023-12-18 DIAGNOSIS — I5031 Acute diastolic (congestive) heart failure: Secondary | ICD-10-CM | POA: Diagnosis not present

## 2023-12-18 DIAGNOSIS — E861 Hypovolemia: Secondary | ICD-10-CM | POA: Diagnosis not present

## 2023-12-18 DIAGNOSIS — I447 Left bundle-branch block, unspecified: Secondary | ICD-10-CM | POA: Diagnosis present

## 2023-12-18 DIAGNOSIS — Z79890 Hormone replacement therapy (postmenopausal): Secondary | ICD-10-CM | POA: Diagnosis not present

## 2023-12-18 DIAGNOSIS — I5043 Acute on chronic combined systolic (congestive) and diastolic (congestive) heart failure: Secondary | ICD-10-CM | POA: Diagnosis not present

## 2023-12-18 DIAGNOSIS — Z87891 Personal history of nicotine dependence: Secondary | ICD-10-CM | POA: Diagnosis not present

## 2023-12-18 DIAGNOSIS — G909 Disorder of the autonomic nervous system, unspecified: Secondary | ICD-10-CM | POA: Diagnosis present

## 2023-12-18 DIAGNOSIS — Z87442 Personal history of urinary calculi: Secondary | ICD-10-CM

## 2023-12-18 DIAGNOSIS — Z87441 Personal history of nephrotic syndrome: Secondary | ICD-10-CM

## 2023-12-18 DIAGNOSIS — I2510 Atherosclerotic heart disease of native coronary artery without angina pectoris: Secondary | ICD-10-CM | POA: Diagnosis not present

## 2023-12-18 DIAGNOSIS — J9811 Atelectasis: Secondary | ICD-10-CM | POA: Diagnosis not present

## 2023-12-18 DIAGNOSIS — N400 Enlarged prostate without lower urinary tract symptoms: Secondary | ICD-10-CM | POA: Diagnosis present

## 2023-12-18 DIAGNOSIS — Z9861 Coronary angioplasty status: Secondary | ICD-10-CM

## 2023-12-18 DIAGNOSIS — Z841 Family history of disorders of kidney and ureter: Secondary | ICD-10-CM

## 2023-12-18 LAB — CBC WITH DIFFERENTIAL (CANCER CENTER ONLY)
Abs Immature Granulocytes: 0.02 10*3/uL (ref 0.00–0.07)
Basophils Absolute: 0 10*3/uL (ref 0.0–0.1)
Basophils Relative: 0 %
Eosinophils Absolute: 0 10*3/uL (ref 0.0–0.5)
Eosinophils Relative: 0 %
HCT: 41.6 % (ref 39.0–52.0)
Hemoglobin: 14.2 g/dL (ref 13.0–17.0)
Immature Granulocytes: 0 %
Lymphocytes Relative: 19 %
Lymphs Abs: 1.6 10*3/uL (ref 0.7–4.0)
MCH: 32.5 pg (ref 26.0–34.0)
MCHC: 34.1 g/dL (ref 30.0–36.0)
MCV: 95.2 fL (ref 80.0–100.0)
Monocytes Absolute: 0.5 10*3/uL (ref 0.1–1.0)
Monocytes Relative: 6 %
Neutro Abs: 6.3 10*3/uL (ref 1.7–7.7)
Neutrophils Relative %: 75 %
Platelet Count: 166 10*3/uL (ref 150–400)
RBC: 4.37 MIL/uL (ref 4.22–5.81)
RDW: 14.8 % (ref 11.5–15.5)
WBC Count: 8.4 10*3/uL (ref 4.0–10.5)
nRBC: 0 % (ref 0.0–0.2)

## 2023-12-18 LAB — CMP (CANCER CENTER ONLY)
ALT: 107 U/L — ABNORMAL HIGH (ref 0–44)
AST: 89 U/L — ABNORMAL HIGH (ref 15–41)
Albumin: 1.7 g/dL — ABNORMAL LOW (ref 3.5–5.0)
Alkaline Phosphatase: 85 U/L (ref 38–126)
Anion gap: 2 — ABNORMAL LOW (ref 5–15)
BUN: 18 mg/dL (ref 6–20)
CO2: 33 mmol/L — ABNORMAL HIGH (ref 22–32)
Calcium: 7.5 mg/dL — ABNORMAL LOW (ref 8.9–10.3)
Chloride: 105 mmol/L (ref 98–111)
Creatinine: 0.66 mg/dL (ref 0.61–1.24)
GFR, Estimated: >60 mL/min (ref >60)
Glucose, Bld: 118 mg/dL — ABNORMAL HIGH (ref 70–99)
Potassium: 4 mmol/L (ref 3.5–5.1)
Sodium: 140 mmol/L (ref 135–145)
Total Bilirubin: 0.3 mg/dL (ref 0.0–1.2)
Total Protein: 3.6 g/dL — ABNORMAL LOW (ref 6.5–8.1)

## 2023-12-18 MED ORDER — ALPRAZOLAM 0.5 MG PO TABS
0.5000 mg | ORAL_TABLET | Freq: Two times a day (BID) | ORAL | Status: DC | PRN
  Administered 2023-12-18 – 2023-12-20 (×4): 0.5 mg via ORAL
  Filled 2023-12-18 (×4): qty 1

## 2023-12-18 MED ORDER — ACETAMINOPHEN 325 MG PO TABS
650.0000 mg | ORAL_TABLET | ORAL | Status: DC | PRN
  Administered 2023-12-19: 650 mg via ORAL
  Filled 2023-12-18: qty 2

## 2023-12-18 MED ORDER — LEVOTHYROXINE SODIUM 100 MCG PO TABS
200.0000 ug | ORAL_TABLET | Freq: Every day | ORAL | Status: DC
  Administered 2023-12-19 – 2023-12-21 (×3): 200 ug via ORAL
  Filled 2023-12-18 (×3): qty 2

## 2023-12-18 MED ORDER — BUSPIRONE HCL 5 MG PO TABS
15.0000 mg | ORAL_TABLET | Freq: Two times a day (BID) | ORAL | Status: DC
  Administered 2023-12-18 – 2023-12-21 (×6): 15 mg via ORAL
  Filled 2023-12-18 (×6): qty 1

## 2023-12-18 MED ORDER — ONDANSETRON HCL 4 MG/2ML IJ SOLN: 4.0000 mg | Freq: Four times a day (QID) | INTRAMUSCULAR | Status: DC | PRN

## 2023-12-18 MED ORDER — MIDODRINE HCL 5 MG PO TABS: 20.0000 mg | ORAL_TABLET | Freq: Three times a day (TID) | ORAL | Status: DC

## 2023-12-18 MED ORDER — EZETIMIBE 10 MG PO TABS: 10.0000 mg | ORAL_TABLET | Freq: Every day | ORAL | Status: DC

## 2023-12-18 MED ORDER — ROSUVASTATIN CALCIUM 20 MG PO TABS
40.0000 mg | ORAL_TABLET | Freq: Every day | ORAL | Status: DC
  Administered 2023-12-18 – 2023-12-20 (×3): 40 mg via ORAL
  Filled 2023-12-18 (×4): qty 2

## 2023-12-18 MED ORDER — SODIUM CHLORIDE 0.9% FLUSH: 10.0000 mL | Freq: Once | INTRAVENOUS | Status: DC | PRN

## 2023-12-18 MED ORDER — OXYCODONE HCL 5 MG PO TABS
20.0000 mg | ORAL_TABLET | Freq: Four times a day (QID) | ORAL | Status: DC | PRN
  Administered 2023-12-18 – 2023-12-20 (×3): 20 mg via ORAL
  Filled 2023-12-18 (×4): qty 4

## 2023-12-18 MED ORDER — FUROSEMIDE 10 MG/ML IJ SOLN
80.0000 mg | Freq: Two times a day (BID) | INTRAMUSCULAR | Status: DC
  Administered 2023-12-18 – 2023-12-20 (×4): 80 mg via INTRAVENOUS
  Filled 2023-12-18 (×5): qty 8

## 2023-12-18 MED ORDER — ALFUZOSIN HCL ER 10 MG PO TB24
10.0000 mg | ORAL_TABLET | Freq: Every day | ORAL | Status: DC
  Administered 2023-12-19 – 2023-12-20 (×2): 10 mg via ORAL
  Filled 2023-12-18 (×3): qty 1

## 2023-12-18 MED ORDER — ALBUMIN HUMAN 25 % IV SOLN
25.0000 g | Freq: Every day | INTRAVENOUS | Status: DC
  Administered 2023-12-19: 25 g via INTRAVENOUS
  Administered 2023-12-20: 12.5 g via INTRAVENOUS
  Filled 2023-12-18 (×2): qty 100

## 2023-12-18 MED ORDER — CLOPIDOGREL BISULFATE 75 MG PO TABS
75.0000 mg | ORAL_TABLET | Freq: Every day | ORAL | Status: DC
  Administered 2023-12-19 – 2023-12-21 (×3): 75 mg via ORAL
  Filled 2023-12-18 (×3): qty 1

## 2023-12-18 MED ORDER — DOCUSATE SODIUM 100 MG PO CAPS
100.0000 mg | ORAL_CAPSULE | Freq: Every day | ORAL | Status: DC
  Administered 2023-12-20 – 2023-12-21 (×2): 100 mg via ORAL
  Filled 2023-12-18 (×3): qty 1

## 2023-12-18 MED ORDER — TRAZODONE HCL 50 MG PO TABS
50.0000 mg | ORAL_TABLET | Freq: Every day | ORAL | Status: DC
  Administered 2023-12-18 – 2023-12-20 (×3): 50 mg via ORAL
  Filled 2023-12-18 (×3): qty 1

## 2023-12-18 MED ORDER — PALONOSETRON HCL INJECTION 0.25 MG/5ML
0.2500 mg | Freq: Once | INTRAVENOUS | Status: AC
  Administered 2023-12-18: 0.25 mg via INTRAVENOUS
  Filled 2023-12-18: qty 5

## 2023-12-18 MED ORDER — SODIUM CHLORIDE 0.9 % IV SOLN: 250.0000 mL | INTRAVENOUS | Status: AC | PRN

## 2023-12-18 MED ORDER — VITAMIN C 500 MG PO TABS
500.0000 mg | ORAL_TABLET | Freq: Every day | ORAL | Status: DC
  Administered 2023-12-19 – 2023-12-21 (×3): 500 mg via ORAL
  Filled 2023-12-18 (×3): qty 1

## 2023-12-18 MED ORDER — MIDODRINE HCL 5 MG PO TABS
20.0000 mg | ORAL_TABLET | Freq: Four times a day (QID) | ORAL | Status: DC
  Administered 2023-12-19 – 2023-12-21 (×9): 20 mg via ORAL
  Filled 2023-12-18 (×10): qty 4

## 2023-12-18 MED ORDER — ASPIRIN 81 MG PO TBEC
81.0000 mg | DELAYED_RELEASE_TABLET | Freq: Every day | ORAL | Status: DC
  Administered 2023-12-19 – 2023-12-21 (×3): 81 mg via ORAL
  Filled 2023-12-18 (×3): qty 1

## 2023-12-18 MED ORDER — DROXIDOPA 100 MG PO CAPS
100.0000 mg | ORAL_CAPSULE | Freq: Three times a day (TID) | ORAL | Status: DC
  Administered 2023-12-18 – 2023-12-21 (×9): 100 mg via ORAL
  Filled 2023-12-18 (×11): qty 1

## 2023-12-18 MED ORDER — SENNA 8.6 MG PO TABS
1.0000 | ORAL_TABLET | Freq: Every day | ORAL | Status: DC
  Administered 2023-12-19: 8.6 mg via ORAL
  Filled 2023-12-18 (×2): qty 1

## 2023-12-18 MED ORDER — ACYCLOVIR 400 MG PO TABS
400.0000 mg | ORAL_TABLET | Freq: Two times a day (BID) | ORAL | Status: DC
  Administered 2023-12-18 – 2023-12-21 (×6): 400 mg via ORAL
  Filled 2023-12-18 (×7): qty 1

## 2023-12-18 MED ORDER — SODIUM CHLORIDE 0.9 % IV SOLN
200.0000 mg/m2 | Freq: Once | INTRAVENOUS | Status: AC
  Administered 2023-12-18: 500 mg via INTRAVENOUS
  Filled 2023-12-18: qty 25

## 2023-12-18 MED ORDER — PROCHLORPERAZINE MALEATE 10 MG PO TABS: 10.0000 mg | ORAL_TABLET | Freq: Four times a day (QID) | ORAL | Status: DC | PRN

## 2023-12-18 MED ORDER — SODIUM CHLORIDE 0.9% FLUSH: 3.0000 mL | INTRAVENOUS | Status: DC | PRN

## 2023-12-18 MED ORDER — POLYETHYLENE GLYCOL 3350 17 G PO PACK: 17.0000 g | PACK | Freq: Every day | ORAL | Status: DC | PRN

## 2023-12-18 MED ORDER — ADULT MULTIVITAMIN W/MINERALS CH
1.0000 | ORAL_TABLET | Freq: Every day | ORAL | Status: DC
  Administered 2023-12-19 – 2023-12-21 (×3): 1 via ORAL
  Filled 2023-12-18 (×3): qty 1

## 2023-12-18 MED ORDER — EZETIMIBE 10 MG PO TABS
10.0000 mg | ORAL_TABLET | Freq: Every day | ORAL | Status: DC
  Administered 2023-12-18 – 2023-12-20 (×3): 10 mg via ORAL
  Filled 2023-12-18 (×3): qty 1

## 2023-12-18 MED ORDER — DARATUMUMAB-HYALURONIDASE-FIHJ 1800-30000 MG-UT/15ML ~~LOC~~ SOLN
1800.0000 mg | Freq: Once | SUBCUTANEOUS | Status: AC
  Administered 2023-12-18: 1800 mg via SUBCUTANEOUS
  Filled 2023-12-18: qty 15

## 2023-12-18 MED ORDER — ACETAMINOPHEN 325 MG PO TABS
650.0000 mg | ORAL_TABLET | Freq: Once | ORAL | Status: AC
  Administered 2023-12-18: 650 mg via ORAL
  Filled 2023-12-18: qty 2

## 2023-12-18 MED ORDER — DEXAMETHASONE 4 MG PO TABS
20.0000 mg | ORAL_TABLET | Freq: Once | ORAL | Status: DC
Start: 2023-12-18 — End: 2023-12-18

## 2023-12-18 MED ORDER — SODIUM CHLORIDE 0.9% FLUSH
3.0000 mL | Freq: Two times a day (BID) | INTRAVENOUS | Status: DC
  Administered 2023-12-18 – 2023-12-21 (×7): 3 mL via INTRAVENOUS

## 2023-12-18 MED ORDER — SODIUM CHLORIDE 0.9 % IV SOLN
16.0000 mg | Freq: Once | INTRAVENOUS | Status: AC
  Administered 2023-12-18: 16 mg via INTRAVENOUS
  Filled 2023-12-18: qty 1.6

## 2023-12-18 MED ORDER — ALBUMIN HUMAN 25 % IV SOLN
25.0000 g | Freq: Once | INTRAVENOUS | Status: AC
  Administered 2023-12-18: 25 g via INTRAVENOUS
  Filled 2023-12-18: qty 100

## 2023-12-18 MED ORDER — ROSUVASTATIN CALCIUM 20 MG PO TABS: 40.0000 mg | ORAL_TABLET | Freq: Every day | ORAL | Status: DC

## 2023-12-18 MED ORDER — DIPHENHYDRAMINE HCL 25 MG PO CAPS
50.0000 mg | ORAL_CAPSULE | Freq: Once | ORAL | Status: AC
  Administered 2023-12-18: 50 mg via ORAL
  Filled 2023-12-18: qty 2

## 2023-12-18 NOTE — Progress Notes (Signed)
 Pt's wife requested to have male purewick put on tonight for frequent urination.

## 2023-12-18 NOTE — Telephone Encounter (Signed)
 Spoke with patients wife- patient had a bad day yesterday with hypotensive episodes/lower extremity swelling.   Reports today BP was 82/49 at [facility].   Reports that patients weight is up more, legs very swollen, and now swelling starting in his hands. Reports total weight is up 25 pounds since May 8th.   Spoke with Dr. Sabharwal- will plan on direct admission. Call placed to patient placement, they are working on getting patient a bed.   Patients wife aware they will get phone call from admitting. Patients wife grateful for return call.

## 2023-12-18 NOTE — Patient Instructions (Signed)
 [facility] - A DEPT OF [facility]  Discharge Instructions: Thank you for choosing [facility] to provide your oncology and hematology care.   If you have a lab appointment with the Cancer Center, please go directly to the Cancer Center and check in at the registration area.   Wear comfortable clothing and clothing appropriate for easy access to any Portacath or PICC line.   We strive to give you quality time with your provider. You may need to reschedule your appointment if you arrive late (15 or more minutes).  Arriving late affects you and other patients whose appointments are after yours.  Also, if you miss three or more appointments without notifying the office, you may be dismissed from the clinic at the provider's discretion.      For prescription refill requests, have your pharmacy contact our office and allow 72 hours for refills to be completed.    Today you received the following chemotherapy and/or immunotherapy agents Cytoxan , Darzalex  Faspro      To help prevent nausea and vomiting after your treatment, we encourage you to take your nausea medication as directed.  BELOW ARE SYMPTOMS THAT SHOULD BE REPORTED IMMEDIATELY: *FEVER GREATER THAN 100.4 F (38 C) OR HIGHER *CHILLS OR SWEATING *NAUSEA AND VOMITING THAT IS NOT CONTROLLED WITH YOUR NAUSEA MEDICATION *UNUSUAL SHORTNESS OF BREATH *UNUSUAL BRUISING OR BLEEDING *URINARY PROBLEMS (pain or burning when urinating, or frequent urination) *BOWEL PROBLEMS (unusual diarrhea, constipation, pain near the anus) TENDERNESS IN MOUTH AND THROAT WITH OR WITHOUT PRESENCE OF ULCERS (sore throat, sores in mouth, or a toothache) UNUSUAL RASH, SWELLING OR PAIN  UNUSUAL VAGINAL DISCHARGE OR ITCHING   Items with * indicate a potential emergency and should be followed up as soon as possible or go to the Emergency Department if any problems should occur.  Please show the CHEMOTHERAPY ALERT CARD or  IMMUNOTHERAPY ALERT CARD at check-in to the Emergency Department and triage nurse.  Should you have questions after your visit or need to cancel or reschedule your appointment, please contact [facility] - A DEPT OF [facility]  Dept: [phone]  and follow the prompts.  Office hours are 8:00 a.m. to 4:30 p.m. Monday - Friday. Please note that voicemails left after 4:00 p.m. may not be returned until the following business day.  We are closed weekends and major holidays. You have access to a nurse at all times for urgent questions. Please call the main number to the clinic Dept: [phone] and follow the prompts.   For any non-urgent questions, you may also contact your provider using MyChart. We now offer e-Visits for anyone 75 and older to request care online for non-urgent symptoms. For details visit [email or web address].   Also download the MyChart app! Go to the app store, search MyChart, open the app, select [place], and log in with your MyChart username and password.  Albumin  Injection What is this medication? ALBUMIN  (al BYOO min) treats low blood volume, which may occur with severe dehydration or bleeding. It works by increasing blood volume so your heart can pump blood to the rest of your body. It may also be used to treat low albumin  levels caused by surgery, infection, or other health conditions. Albumin  is a protein that helps your body balance the level of fluid in your blood vessels. This helps maintain a healthy blood pressure and prevents swelling or edema. This medicine may  be used for other purposes; ask your health care provider or pharmacist if you have questions. COMMON BRAND NAME(S): Albuked , Albumarc, Albuminar, Albuminex , AlbuRx , Albutein , Buminate, Flexbumin , Kedbumin , Macrotec, Plasbumin , Plasbumin -20 What should I tell my care team before I take this medication? They need to know if you have any of these conditions: Heart  disease Kidney disease Low red blood cell levels (anemia) An unusual or allergic reaction to albumin , other medications, foods, dyes, or preservatives Pregnant or trying to get pregnant Breastfeeding How should I use this medication? This medication is infused into a vein. It is given by your care team in a hospital or clinic setting. Talk to your care team about the use of this medication in children. While it may be given to children for selected conditions, precautions do apply. Overdosage: If you think you have taken too much of this medicine contact a poison control center or emergency room at once. NOTE: This medicine is only for you. Do not share this medicine with others. What if I miss a dose? This does not apply. What may interact with this medication? Interactions are not expected. This list may not describe all possible interactions. Give your health care provider a list of all the medicines, herbs, non-prescription drugs, or dietary supplements you use. Also tell them if you smoke, drink alcohol, or use illegal drugs. Some items may interact with your medicine. What should I watch for while using this medication? Your condition will be monitored carefully while you are receiving this medication. This product is derived from human plasma. Talk to your care team about the risks and benefits of this medication. What side effects may I notice from receiving this medication? Side effects that you should report to your care team as soon as possible: Allergic reactions--skin rash, itching, hives, swelling of the face, lips, tongue, or throat Heart failure--shortness of breath, swelling of the ankles, feet, or hands, sudden weight gain, unusual weakness or fatigue Increase in blood pressure Shortness of breath or trouble breathing, cough, unusual weakness or fatigue, blue skin or lips Side effects that usually do not require medical attention (report these to your care team if they  continue or are bothersome): Flushing Headache Nausea This list may not describe all possible side effects. Call your doctor for medical advice about side effects. You may report side effects to FDA at 1-800-FDA-1088. Where should I keep my medication? This medication is given in a hospital or clinic. It will not be stored at home. NOTE: This sheet is a summary. It may not cover all possible information. If you have questions about this medicine, talk to your doctor, pharmacist, or health care provider.  2024 Elsevier/Gold Standard (2023-05-19 00:00:00)

## 2023-12-18 NOTE — Progress Notes (Signed)
 PICC order noted, risks and benefits discussed with pt and spouse. Questions answered. Informed them PICC will be placed on 7/1. Luke, RN aware as well.

## 2023-12-18 NOTE — Progress Notes (Signed)
 Orthopedic Tech Progress Note Patient Details:  Joseph [name]. [birth year] [id number] Applied unna boots per order.  Ortho Devices Type of Ortho Device: Radio broadcast assistant Ortho Device/Splint Location: BLE Ortho Device/Splint Interventions: Ordered, Application, Adjustment   Post Interventions Patient Tolerated: Well Instructions Provided: Adjustment of device, Care of device, Poper ambulation with device  Joseph [name] 12/18/2023, 6:52 PM
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
## 2023-12-19 ENCOUNTER — Other Ambulatory Visit: Payer: Self-pay | Admitting: *Deleted

## 2023-12-19 DIAGNOSIS — I5043 Acute on chronic combined systolic (congestive) and diastolic (congestive) heart failure: Secondary | ICD-10-CM | POA: Diagnosis not present

## 2023-12-19 DIAGNOSIS — N200 Calculus of kidney: Secondary | ICD-10-CM

## 2023-12-19 LAB — BASIC METABOLIC PANEL WITH GFR
Anion gap: 8 (ref 5–15)
BUN: 16 mg/dL (ref 6–20)
CO2: 26 mmol/L (ref 22–32)
Calcium: 7.7 mg/dL — ABNORMAL LOW (ref 8.9–10.3)
Chloride: 104 mmol/L (ref 98–111)
Creatinine, Ser: 0.7 mg/dL (ref 0.61–1.24)
GFR, Estimated: >60 mL/min (ref >60)
Glucose, Bld: 134 mg/dL — ABNORMAL HIGH (ref 70–99)
Potassium: 4.3 mmol/L (ref 3.5–5.1)
Sodium: 138 mmol/L (ref 135–145)

## 2023-12-19 LAB — CBC
HCT: 43.7 % (ref 39.0–52.0)
Hemoglobin: 15 g/dL (ref 13.0–17.0)
MCH: 32.6 pg (ref 26.0–34.0)
MCHC: 34.3 g/dL (ref 30.0–36.0)
MCV: 95 fL (ref 80.0–100.0)
Platelets: 169 10*3/uL (ref 150–400)
RBC: 4.6 MIL/uL (ref 4.22–5.81)
RDW: 14.7 % (ref 11.5–15.5)
WBC: 6.6 10*3/uL (ref 4.0–10.5)
nRBC: 0 % (ref 0.0–0.2)

## 2023-12-19 LAB — COOXEMETRY PANEL
Carboxyhemoglobin: 1.7 % — ABNORMAL HIGH (ref 0.5–1.5)
Methemoglobin: <0.7 % (ref 0.0–1.5)
O2 Saturation: 70.6 %
Total hemoglobin: 15 g/dL (ref 12.0–16.0)

## 2023-12-19 LAB — MAGNESIUM: Magnesium: 1.9 mg/dL (ref 1.7–2.4)

## 2023-12-19 MED ORDER — SODIUM CHLORIDE 0.9% FLUSH
10.0000 mL | Freq: Two times a day (BID) | INTRAVENOUS | Status: DC
  Administered 2023-12-19 – 2023-12-21 (×5): 10 mL

## 2023-12-19 MED ORDER — CHLORHEXIDINE GLUCONATE CLOTH 2 % EX PADS
6.0000 | MEDICATED_PAD | Freq: Every day | CUTANEOUS | Status: DC
  Administered 2023-12-19 – 2023-12-21 (×3): 6 via TOPICAL

## 2023-12-19 MED ORDER — MAGNESIUM SULFATE 2 GM/50ML IV SOLN
2.0000 g | Freq: Once | INTRAVENOUS | Status: AC
  Administered 2023-12-19: 2 g via INTRAVENOUS
  Filled 2023-12-19: qty 50

## 2023-12-19 MED ORDER — SODIUM CHLORIDE 0.9% FLUSH: 10.0000 mL | INTRAVENOUS | Status: DC | PRN

## 2023-12-19 NOTE — Progress Notes (Signed)
 Heart Failure Navigator Progress Note  Assessed for Heart & Vascular TOC clinic readiness.  Patient does not meet criteria due to Advanced Heart Failure Team patient of Dr. Elwyn Lade. .   Navigator will sign off at this time.    Rhae Hammock, BSN, Scientist, clinical (histocompatibility and immunogenetics) Only

## 2023-12-19 NOTE — TOC Initial Note (Addendum)
 Transition of Care (TOC) - Initial/Assessment Note    Patient Details  Name: Joseph [name]. MRN: [id number] Date of Birth: [birth year]  Transition of Care [facility]) CM/SW Contact:    Arlana JINNY Nicholaus ISRA[place] Phone Number: [phone] 12/19/2023, 12:34 PM  Clinical Narrative:  HF CSW met with patient and wife at bedside. Patient and wife stated that they live alone. Patient stated that he has no history of HH services. Patient stated that he uses a walker and shower chair. Patient stated that he drives, but not often. Patient stated that he doe snot work, and received disability. Patient stated that he has a scale at home. Patient has a PCP. CSW explained that a hospital follow up appointment is typically scheduled closer towards dc. Patient is agreeable.   Patients wife inquired about rollator. HF CSW notified HF NCM via secure chat. Patients wife asked if there would be a copay. HF NCM will follow up with patient and family.   TOC will continue following.                  Expected Discharge Plan: Home/Self Care Barriers to Discharge: Continued Medical Work up   Patient Goals and CMS Choice            Expected Discharge Plan and Services       Living arrangements for the past 2 months: Single Family Home                                      Prior Living Arrangements/Services Living arrangements for the past 2 months: Single Family Home Lives with:: Spouse Patient language and need for interpreter reviewed:: Yes Do you feel safe going back to the place where you live?: Yes      Need for Family Participation in Patient Care: Yes (Comment) Care giver support system in place?: Yes (comment)   Criminal Activity/Legal Involvement Pertinent to Current Situation/Hospitalization: No - Comment as needed  Activities of Daily Living   ADL Screening (condition at time of admission) Independently performs ADLs?: Yes (appropriate for developmental age) Is the patient deaf or  have difficulty hearing?: No Does the patient have difficulty seeing, even when wearing glasses/contacts?: No Does the patient have difficulty concentrating, remembering, or making decisions?: No  Permission Sought/Granted                  Emotional Assessment Appearance:: Appears stated age Attitude/Demeanor/Rapport: Engaged Affect (typically observed): Appropriate Orientation: : Oriented to Self, Oriented to Place, Oriented to  Time, Oriented to Situation Alcohol / Substance Use: Not Applicable Psych Involvement: No (comment)  Admission diagnosis:  Heart failure (HCC) [I50.9] Patient Active Problem List   Diagnosis Date Noted   Heart failure (HCC) 12/18/2023   Acute diastolic CHF (congestive heart failure) (HCC) 12/18/2023   Multiple myeloma (HCC) 10/11/2023   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   CAP (community acquired pneumonia) 10/01/2023   Acute respiratory failure with hypoxia (HCC) 10/01/2023   Pulmonary nodules 10/01/2023   Nephrotic syndrome 08/23/2023   Hypotension 08/22/2023   Subacute ischemic stroke (HCC) 06/15/2023   Memory impairment 06/15/2023   Hypothyroidism 06/15/2023   History of CAD (coronary artery disease) 06/15/2023   History of TIA (transient ischemic attack) 06/15/2023   Hypokalemia 06/15/2023   Paroxysmal atrial fibrillation (HCC) 06/15/2023   Hypoxic respiratory failure since cardiac surgery 04/2023 (HCC) 06/15/2023   Panic disorder 06/15/2023   BPH (  benign prostatic hyperplasia) 06/15/2023   History of hip surgery 06/15/2023   Pericardial effusion 06/13/2023   Left ventricular hypertrophy 05/30/2023   Prolonged QT interval 05/30/2023   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 (HCC) 04/14/2023   Constipation 01/19/2023   Postprandial abdominal bloating 01/19/2023   LLQ pain 01/19/2023   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    Palpitations 09/20/2022   Allergy to alpha-gal 12/16/2019   Angio-edema 06/17/2019   Allergic reaction 06/17/2019   Penicillin allergy 06/17/2019   Avascular necrosis of bone of left hip (HCC) 04/08/2015   Avascular necrosis of hip (HCC) 04/08/2015   Postoperative anemia due to acute blood loss 02/20/2014   Closed left hip fracture (HCC) 02/07/2014   OSA (obstructive sleep apnea) 02/07/2014   Essential hypertension 11/29/2010   Hyperlipidemia 09/08/2009   Coronary artery disease involving native coronary artery of native heart with angina pectoris (HCC) 09/08/2009   PCP:  [name], MD Pharmacy:   [facility] [place], [place] - [address] [place] [address] Phone: [phone] Fax: [phone]  DARRYLE LONG - [facility] [address] [place] [address] Phone: [phone] Fax: [phone]     Social Drivers of Health (SDOH) Social History: SDOH Screenings   Food Insecurity: No Food Insecurity (12/18/2023)  Housing: Low Risk  (12/18/2023)  Transportation Needs: No Transportation Needs (12/18/2023)  Utilities: Not At Risk (12/18/2023)  Depression (PHQ2-9): Medium Risk (06/27/2023)  Financial Resource Strain: Low Risk  (12/31/2020)   Received from [facility]  Physical Activity: Inactive (12/31/2020)   Received from [facility]  Social Connections: Unknown (11/01/2021)   Received from [facility]  Stress: No Stress Concern Present (12/31/2020)   Received from [facility]  Tobacco Use: Medium Risk (12/18/2023)   SDOH Interventions:     Readmission Risk Interventions     No data to display

## 2023-12-19 NOTE — Progress Notes (Signed)
 Advanced Heart Failure Rounding Note  Cardiologist: Ozell Fell, MD  HF Cardiologist: Dr. Zenaida Chief Complaint:  Subjective:    BP up with diuresis and albumin  SBP 140-150s this am. 2L UOP (net - 1.6L). CXR with pulm edema and low lung volumes. Edema improving from yesterday  Objective:    Weight Range: 134.4 kg Body mass index is 40.17 kg/m.   Vital Signs:   Temp:  [97.5 F (36.4 C)-98.3 F (36.8 C)] 97.8 F (36.6 C) (07/01 0443) Pulse Rate:  [58-87] 76 (07/01 0443) Resp:  [18-20] 18 (07/01 0443) BP: (107-160)/(74-107) 107/79 (07/01 0443) SpO2:  [91 %-95 %] 91 % (07/01 0443) Weight:  [134 kg-135.1 kg] 134.4 kg (07/01 0443) Last BM Date : 12/17/23  Weight change: Filed Weights   12/18/23 1708 12/19/23 0443  Weight: 135.1 kg 134.4 kg   Intake/Output:  Intake/Output Summary (Last 24 hours) at 12/19/2023 0711 Last data filed at 12/19/2023 0442 Gross per 24 hour  Intake 360 ml  Output 2000 ml  Net -1640 ml    Physical Exam    General: Acutely-ill appearing. No distress on RA Cardiac: JVP difficult to assess. S1 and S2 present. No murmurs or rub. Abdomen: Soft, non-tender, distended.  Extremities: Warm and dry.  Generalized 3+ edema. + unna boots Neuro: Alert and oriented x3. Affect pleasant. Moves all extremities without difficulty.  Telemetry   SR in 70s, 11b NSVT at 0300 (personally reviewed)  EKG    NSR 84 bpm with LBBB, QRS 174 ms (personally reviewed)  Labs    CBC Recent Labs    12/18/23 1029 12/19/23 0141  WBC 8.4 6.6  NEUTROABS 6.3  --   HGB 14.2 15.0  HCT 41.6 43.7  MCV 95.2 95.0  PLT 166 169   Basic Metabolic Panel Recent Labs    93/69/74 1029 12/19/23 0141  NA 140 138  K 4.0 4.3  CL 105 104  CO2 33* 26  GLUCOSE 118* 134*  BUN 18 16  CREATININE 0.66 0.70  CALCIUM  7.5* 7.7*  MG  --  1.9   Liver Function Tests Recent Labs    12/18/23 1029  AST 89*  ALT 107*  ALKPHOS 85  BILITOT 0.3  PROT 3.6*  ALBUMIN  1.7*     BNP (last 3 results) Recent Labs    05/22/23 1627 06/15/23 1235 10/01/23 1447  BNP 632.9* 316.7* 374.3*   ProBNP (last 3 results) Recent Labs    08/19/23 0837  PROBNP 44   Medications:    Scheduled Medications:  acyclovir   400 mg Oral BID   alfuzosin   10 mg Oral QHS   ascorbic acid  500 mg Oral Daily   aspirin  EC  81 mg Oral Daily   busPIRone   15 mg Oral BID   clopidogrel   75 mg Oral Daily   docusate sodium   100 mg Oral Daily   droxidopa   100 mg Oral TID WC   ezetimibe   10 mg Oral QHS   furosemide   80 mg Intravenous BID   levothyroxine   200 mcg Oral QAC breakfast   midodrine   20 mg Oral QID   multivitamin with minerals  1 tablet Oral Daily   rosuvastatin   40 mg Oral QHS   senna  1 tablet Oral Daily   sodium chloride  flush  3 mL Intravenous Q12H   traZODone   50 mg Oral QHS   Infusions:  sodium chloride      albumin  human     magnesium  sulfate bolus IVPB  PRN Medications: sodium chloride , acetaminophen , ALPRAZolam , [START ON 12/21/2023] ondansetron  (ZOFRAN ) IV, oxyCODONE , polyethylene glycol, prochlorperazine , sodium chloride  flush  Patient Profile   58 y.o. male with history of AL amyloid diagnosed in March previously thought to be HOCM s/p myectomy and CABG at [facility] in 2024 c/b CVA, HFpEF, CAD s/p PCI to LAD in 2015 and CABG, and nephrotic syndrome. Now undergoing treatment for Myeloma with Daratumumab  and cytoxan  every 4 weeks.   [facility] and edema.   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 Suspect AL amyloid with obstructive physiology. Initially thought to be HOCM d/t LVOT obstruction s/p myectomy at [facility] in 11/24. [place]-red positive, no specific amyloid protein identified on path. Now on chemotherapy: daratumumab  and cytoxan  once weekly.  -  Repeat echo 11/28/23: EF 50-55%, mod LVH, RWMA. - complicated by nephrotic syndrome and debilitating orthostatic hypotension.  - place PICC line for CVP monitoring; would avoid overdiuresis - With severe anasarca and hypervolemia - Continue midodrine  20 mg qid and droxidopa  100 mg tid. BP improving with diuresis. Hold midodrine  for SBP >120 - place unna boots; refuses abdominal binder d/t claustrophobia - continue IV Lasix  80 mg IV bid, do not hold unless SBP <80 or MAP <60; daily albumin  25g while diuresing   Autonomic neuropathy Orthostatic Hypotension Worsened since starting chemotherapy. 3 syncopal events in the last couple months, often is not able to stand to do ADLs at home.  - continue midodrine  20 mg qid + droxidopa  100 mg tid - receiving weekly albumin  25 mg IV in OP; give daily with diuresis - watch carefully with diuresis - can consider pyridostigmine as next step   CAD s/p single vessel CABG: SVG to RCA. Prior LAD PCI in 2015. - Continue Plavix . On ASA for h/o CVA.   H/o CVA:  - No evidence of atrial fibrillation since admission in 11/24 - ASA/plavix , statin, and zetia  as above   Nephrotic syndrome: In the setting of myeloma and AL amyloid. - Follows with nephrology; getting OP albumin  q weekly - albumin  1.7   Chronic Back Pain - continue home oxy IR 20 mg q6hr as needed   Alpha Gal - avoid red meat; medication monitoring  Length of Stay: 1  Joseph Eiley Mcginnity, NP  12/19/2023, 7:11 AM  Advanced Heart Failure Team Pager [phone] (M-F; 7a - 5p)  Please contact [facility] for night-coverage after hours (5p -7a ) and weekends on [email or web address]

## 2023-12-19 NOTE — TOC Progression Note (Signed)
 Transition of Care (TOC) - Progression Note    Patient Details  Name: Joseph [name]. MRN: [id number] Date of Birth: [birth year]  Transition of Care [facility]) CM/SW Contact  Justina Delcia Czar, RN Phone Number: [phone] 12/19/2023, 3:16 PM  Clinical Narrative:     Spoke to pt and wife at bedside. Wife states pt has DME at home, but she wanted to see if insurance would cover a Rollator if he needed a new one. The Rollator was given to him by family. Explained insurance covers Rollator, unable to guarantee he would not have a copay. She wants to hold getting a Rollator.   Provided pt with Living Better with HF booklet. Educated on daily weights and low sodium diet.   Expected Discharge Plan: Home/Self Care Barriers to Discharge: Continued Medical Work up  Expected Discharge Plan and Services   Discharge Planning Services: CM Consult   Living arrangements for the past 2 months: Single Family Home                                       Social Determinants of Health (SDOH) Interventions SDOH Screenings   Food Insecurity: No Food Insecurity (12/18/2023)  Housing: Low Risk  (12/18/2023)  Transportation Needs: No Transportation Needs (12/18/2023)  Utilities: Not At Risk (12/18/2023)  Depression (PHQ2-9): Medium Risk (06/27/2023)  Financial Resource Strain: Low Risk  (12/31/2020)   Received from [facility]  Physical Activity: Inactive (12/31/2020)   Received from [facility]  Social Connections: Unknown (11/01/2021)   Received from [facility]  Stress: No Stress Concern Present (12/31/2020)   Received from [facility]  Tobacco Use: Medium Risk (12/18/2023)    Readmission Risk Interventions     No data to display

## 2023-12-19 NOTE — Progress Notes (Signed)
 Peripherally Inserted Central Catheter Placement  The IV Nurse has discussed with the patient and/or persons authorized to consent for the patient, the purpose of this procedure and the potential benefits and risks involved with this procedure.  The benefits include less needle sticks, lab draws from the catheter, and the patient may be discharged home with the catheter. Risks include, but not limited to, infection, bleeding, blood clot (thrombus formation), and puncture of an artery; nerve damage and irregular heartbeat and possibility to perform a PICC exchange if needed/ordered by physician.  Alternatives to this procedure were also discussed.  Bard Power PICC patient education guide, fact sheet on infection prevention and patient information card has been provided to patient /or left at bedside.    PICC Placement Documentation  PICC Double Lumen 12/19/23 Right Brachial 39 cm 0 cm (Active)  Indication for Insertion or Continuance of Line Chronic illness with exacerbations (CF, Sickle Cell, etc.) 12/19/23 0900  Exposed Catheter (cm) 0 cm 12/19/23 0900  Site Assessment Clean, Dry, Intact 12/19/23 0900  Lumen #1 Status Flushed;Saline locked;Blood return noted 12/19/23 0900  Lumen #2 Status Flushed;Saline locked;Blood return noted 12/19/23 0900  Dressing Type Transparent;Securing device 12/19/23 0900  Dressing Status Antimicrobial disc/dressing in place;Clean, Dry, Intact 12/19/23 0900  Line Care Connections checked and tightened 12/19/23 0900  Line Adjustment (NICU/IV Team Only) No 12/19/23 0900  Dressing Intervention New dressing;Adhesive placed at insertion site (IV team only) 12/19/23 0900  Dressing Change Due 12/26/23 12/19/23 0900       Ethyl Priestly Renee 12/19/2023, 9:23 AM

## 2023-12-20 DIAGNOSIS — I5031 Acute diastolic (congestive) heart failure: Secondary | ICD-10-CM | POA: Diagnosis not present

## 2023-12-20 LAB — UPEP/UIFE/LIGHT CHAINS/TP, 24-HR UR
% BETA, Urine: 12.4 %
ALPHA 1 URINE: 6.7 %
Albumin, U: 77.1 %
Alpha 2, Urine: 3.3 %
Free Kappa Lt Chains,Ur: 11.64 mg/L (ref 1.17–86.46)
Free Kappa/Lambda Ratio: 0.46 — ABNORMAL LOW (ref 1.83–14.26)
Free Lambda Lt Chains,Ur: 25.17 mg/L — ABNORMAL HIGH (ref 0.27–15.21)
GAMMA GLOBULIN URINE: 0.6 %
Total Protein, Urine-Ur/day: 6057 mg/(24.h) — ABNORMAL HIGH (ref 30–150)
Total Protein, Urine: 448.7 mg/dL
Total Volume: 1350

## 2023-12-20 LAB — BASIC METABOLIC PANEL WITH GFR
Anion gap: 5 (ref 5–15)
BUN: 19 mg/dL (ref 6–20)
CO2: 33 mmol/L — ABNORMAL HIGH (ref 22–32)
Calcium: 7.4 mg/dL — ABNORMAL LOW (ref 8.9–10.3)
Chloride: 103 mmol/L (ref 98–111)
Creatinine, Ser: 0.81 mg/dL (ref 0.61–1.24)
GFR, Estimated: >60 mL/min (ref >60)
Glucose, Bld: 121 mg/dL — ABNORMAL HIGH (ref 70–99)
Potassium: 3.9 mmol/L (ref 3.5–5.1)
Sodium: 141 mmol/L (ref 135–145)

## 2023-12-20 LAB — CBC
HCT: 40.7 % (ref 39.0–52.0)
Hemoglobin: 14 g/dL (ref 13.0–17.0)
MCH: 33.3 pg (ref 26.0–34.0)
MCHC: 34.4 g/dL (ref 30.0–36.0)
MCV: 96.9 fL (ref 80.0–100.0)
Platelets: 161 10*3/uL (ref 150–400)
RBC: 4.2 MIL/uL — ABNORMAL LOW (ref 4.22–5.81)
RDW: 15 % (ref 11.5–15.5)
WBC: 14.7 10*3/uL — ABNORMAL HIGH (ref 4.0–10.5)
nRBC: 0 % (ref 0.0–0.2)

## 2023-12-20 LAB — MAGNESIUM: Magnesium: 2 mg/dL (ref 1.7–2.4)

## 2023-12-20 MED ORDER — FUROSEMIDE 10 MG/ML IJ SOLN: 160.0000 mg | Freq: Two times a day (BID) | INTRAVENOUS | Status: DC

## 2023-12-20 MED ORDER — FUROSEMIDE 10 MG/ML IJ SOLN
120.0000 mg | Freq: Two times a day (BID) | INTRAVENOUS | Status: DC
  Administered 2023-12-20 – 2023-12-21 (×2): 120 mg via INTRAVENOUS
  Filled 2023-12-20: qty 12
  Filled 2023-12-20 (×2): qty 10

## 2023-12-20 MED ORDER — POTASSIUM CHLORIDE CRYS ER 20 MEQ PO TBCR
40.0000 meq | EXTENDED_RELEASE_TABLET | Freq: Once | ORAL | Status: AC
  Administered 2023-12-20: 40 meq via ORAL
  Filled 2023-12-20: qty 2

## 2023-12-20 MED ORDER — OXYCODONE HCL 5 MG PO TABS
20.0000 mg | ORAL_TABLET | Freq: Four times a day (QID) | ORAL | Status: DC | PRN
  Administered 2023-12-21 (×2): 20 mg via ORAL
  Filled 2023-12-20 (×2): qty 4

## 2023-12-20 MED ORDER — OXYCODONE HCL 5 MG PO TABS
10.0000 mg | ORAL_TABLET | Freq: Four times a day (QID) | ORAL | Status: DC | PRN
  Administered 2023-12-20 (×2): 10 mg via ORAL

## 2023-12-20 MED ORDER — DOXYLAMINE SUCCINATE (SLEEP) 25 MG PO TABS
25.0000 mg | ORAL_TABLET | Freq: Every evening | ORAL | Status: DC | PRN
  Administered 2023-12-20: 25 mg via ORAL
  Filled 2023-12-20 (×2): qty 1

## 2023-12-20 NOTE — Plan of Care (Signed)
  Problem: Education: Goal: Knowledge of General Education information will improve Description: Including pain rating scale, medication(s)/side effects and non-pharmacologic comfort measures Outcome: Progressing   Problem: Health Behavior/Discharge Planning: Goal: Ability to manage health-related needs will improve Outcome: Progressing   Problem: Clinical Measurements: Goal: Ability to maintain clinical measurements within normal limits will improve Outcome: Progressing Goal: Will remain free from infection Outcome: Progressing Goal: Diagnostic test results will improve Outcome: Progressing Goal: Respiratory complications will improve Outcome: Progressing Goal: Cardiovascular complication will be avoided Outcome: Progressing   Problem: Clinical Measurements: Goal: Ability to maintain clinical measurements within normal limits will improve Outcome: Progressing Goal: Will remain free from infection Outcome: Progressing Goal: Diagnostic test results will improve Outcome: Progressing Goal: Respiratory complications will improve Outcome: Progressing Goal: Cardiovascular complication will be avoided Outcome: Progressing   Problem: Clinical Measurements: Goal: Ability to maintain clinical measurements within normal limits will improve Outcome: Progressing

## 2023-12-20 NOTE — Progress Notes (Signed)
 Advanced Heart Failure Rounding Note  Cardiologist: Ozell Fell, MD  HF Cardiologist: Dr. Zenaida Chief Complaint: Volume overload Subjective:    Coox 71%. CVP 12 BP remains stable with diuresis. 5.4L UOP yesterday (only net -1.6L), had 3.6L PO intake. Weight down to 288lb.   Sitting up in bed. Tired, has not been sleeping well. Frustrated by being in the hospital.   Objective:    Weight Range: 135.6 kg Body mass index is 40.54 kg/m.   Vital Signs:   Temp:  [97.3 F (36.3 C)-98.2 F (36.8 C)] 97.8 F (36.6 C) (07/02 0740) Pulse Rate:  [66-81] 66 (07/02 0740) Resp:  [18-19] 18 (07/02 0740) BP: (92-133)/(65-91) 92/65 (07/02 0740) SpO2:  [91 %-99 %] 93 % (07/02 0740) Weight:  [135.6 kg] 135.6 kg (07/02 0420) Last BM Date : 12/19/23  Weight change: Filed Weights   12/18/23 1708 12/19/23 0443 12/20/23 0420  Weight: 135.1 kg 134.4 kg 135.6 kg   Intake/Output:  Intake/Output Summary (Last 24 hours) at 12/20/2023 0745 Last data filed at 12/20/2023 0700 Gross per 24 hour  Intake 3762.4 ml  Output 5400 ml  Net -1637.6 ml    Physical Exam    General: Acutely-ill appearing. No distress on RA Cardiac: JVP difficult to asesss. S1 and S2 present. No murmurs or rub. Abdomen: Soft, non-tender, distended.  Extremities: Warm and dry.  2+ edema. + unna boots Neuro: Alert and oriented x3. Affect pleasant. Moves all extremities without difficulty. Lines/Devices:  RUE PICC  Telemetry   SR in 60s (personally reviewed)  EKG    No new EKG to review  Labs    CBC Recent Labs    12/18/23 1029 12/19/23 0141 12/20/23 0115  WBC 8.4 6.6 14.7*  NEUTROABS 6.3  --   --   HGB 14.2 15.0 14.0  HCT 41.6 43.7 40.7  MCV 95.2 95.0 96.9  PLT 166 169 161   Basic Metabolic Panel Recent Labs    92/98/74 0141 12/20/23 0115  NA 138 141  K 4.3 3.9  CL 104 103  CO2 26 33*  GLUCOSE 134* 121*  BUN 16 19  CREATININE 0.70 0.81  CALCIUM  7.7* 7.4*  MG 1.9 2.0   Liver Function  Tests Recent Labs    12/18/23 1029  AST 89*  ALT 107*  ALKPHOS 85  BILITOT 0.3  PROT 3.6*  ALBUMIN  1.7*    BNP (last 3 results) Recent Labs    05/22/23 1627 06/15/23 1235 10/01/23 1447  BNP 632.9* 316.7* 374.3*   ProBNP (last 3 results) Recent Labs    08/19/23 0837  PROBNP 44   Medications:    Scheduled Medications:  acyclovir   400 mg Oral BID   alfuzosin   10 mg Oral QHS   ascorbic acid  500 mg Oral Daily   aspirin  EC  81 mg Oral Daily   busPIRone   15 mg Oral BID   Chlorhexidine  Gluconate Cloth  6 each Topical Daily   clopidogrel   75 mg Oral Daily   docusate sodium   100 mg Oral Daily   droxidopa   100 mg Oral TID WC   ezetimibe   10 mg Oral QHS   furosemide   80 mg Intravenous BID   levothyroxine   200 mcg Oral QAC breakfast   midodrine   20 mg Oral QID   multivitamin with minerals  1 tablet Oral Daily   potassium chloride   40 mEq Oral Once   rosuvastatin   40 mg Oral QHS   senna  1 tablet Oral Daily  sodium chloride  flush  10-40 mL Intracatheter Q12H   sodium chloride  flush  3 mL Intravenous Q12H   traZODone   50 mg Oral QHS   Infusions:  albumin  human 25 g (12/19/23 1051)   PRN Medications: acetaminophen , ALPRAZolam , [START ON 12/21/2023] ondansetron  (ZOFRAN ) IV, oxyCODONE , polyethylene glycol, prochlorperazine , sodium chloride  flush, sodium chloride  flush  Patient Profile   58 y.o. male with history of AL amyloid diagnosed in March previously thought to be HOCM s/p myectomy and CABG at [facility] in 2024 c/b CVA, HFpEF, CAD s/p PCI to LAD in 2015 and CABG, and nephrotic syndrome. Now undergoing treatment for Myeloma with Daratumumab  and cytoxan  every 4 weeks.   Directly [facility] for hypotension and edema.   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 Suspect AL amyloid with obstructive  physiology. Initially thought to be HOCM d/t LVOT obstruction s/p myectomy at [facility] in 11/24. [place]-red positive, no specific amyloid protein identified on path. Now on chemotherapy: daratumumab  and cytoxan  once weekly.  - Repeat echo 11/28/23: EF 50-55%, mod LVH, RWMA. - admit with severe anasarca and hypervolemia - complicated by nephrotic syndrome and debilitating orthostatic hypotension.  - CVP 12. Continue Lasix  80 mg IV bid + albumin . Discussed reduction of intake and adherence to fluid restriction. - Continue midodrine  20 mg qid and droxidopa  100 mg tid. Hold midodrine  for SBP >120 - refuses abdominal binder d/t claustrophobia; wants UNNA boots off, will break from them today.    Autonomic neuropathy Orthostatic Hypotension Worsened since starting chemotherapy. 3 syncopal events in the last couple months, often is not able to stand to do ADLs at home.  - continue midodrine  20 mg qid + droxidopa  100 mg tid - receiving weekly albumin  25 mg IV in OP; give daily with diuresis - can consider pyridostigmine as next step   CAD s/p single vessel CABG: SVG to RCA. Prior LAD PCI in 2015. - Continue Plavix . On ASA for h/o CVA.   H/o CVA:  - No evidence of atrial fibrillation since admission in 11/24 - ASA/plavix , statin, and zetia  as above   Nephrotic syndrome: In the setting of myeloma and AL amyloid. - Follows with nephrology; getting OP albumin  q weekly - albumin  1.7;    Chronic Back Pain - continue home oxy IR 20 mg q6hr as needed   Alpha Gal - avoid red meat; medication monitoring  Length of Stay: 2  [place] Shanell Aden, NP  12/20/2023, 7:45 AM  Advanced Heart Failure Team Pager [phone] (M-F; 7a - 5p)  Please contact [facility] for night-coverage after hours (5p -7a ) and weekends on [email or web address]

## 2023-12-20 NOTE — Plan of Care (Signed)

## 2023-12-20 NOTE — Progress Notes (Signed)
 Orthopedic Tech Progress Note Patient Details:  Joseph [name] 02/21/1966 [id number]  Ortho Devices Type of Ortho Device: Nonie boot Ortho Device/Splint Location: BLE Ortho Device/Splint Interventions: Ordered, Application, Adjustment   Post Interventions Patient Tolerated: Well Instructions Provided: Care of device   Joseph [name] 12/20/2023, 9:00 PM

## 2023-12-20 NOTE — Progress Notes (Signed)
 Orthopedic Tech Progress Note Patient Details:  Joseph [name] 10/02/1965 [id number]  PA reached out asking if we could reapply a fresh pair of UNNA BOOTS to patient this afternoon. Went to apply a fresh pair and RN told us  to come back later patient was feeling ill/in pain at this second .  Patient ID: Joseph JAYSON [name]., male   DOB: 08/05/1965, 58 y.o.   MRN: [id number]  Joseph [name] Pac 12/20/2023, 3:30 PM

## 2023-12-21 ENCOUNTER — Telehealth (HOSPITAL_COMMUNITY): Payer: Self-pay | Admitting: Pharmacist

## 2023-12-21 ENCOUNTER — Other Ambulatory Visit (HOSPITAL_COMMUNITY): Payer: Self-pay

## 2023-12-21 ENCOUNTER — Other Ambulatory Visit: Payer: Self-pay

## 2023-12-21 DIAGNOSIS — I5043 Acute on chronic combined systolic (congestive) and diastolic (congestive) heart failure: Secondary | ICD-10-CM | POA: Diagnosis not present

## 2023-12-21 LAB — BASIC METABOLIC PANEL WITH GFR
Anion gap: 5 (ref 5–15)
BUN: 19 mg/dL (ref 6–20)
CO2: 31 mmol/L (ref 22–32)
Calcium: 7.5 mg/dL — ABNORMAL LOW (ref 8.9–10.3)
Chloride: 102 mmol/L (ref 98–111)
Creatinine, Ser: 0.71 mg/dL (ref 0.61–1.24)
GFR, Estimated: >60 mL/min (ref >60)
Glucose, Bld: 88 mg/dL (ref 70–99)
Potassium: 3.8 mmol/L (ref 3.5–5.1)
Sodium: 138 mmol/L (ref 135–145)

## 2023-12-21 LAB — CBC
HCT: 41.2 % (ref 39.0–52.0)
Hemoglobin: 13.8 g/dL (ref 13.0–17.0)
MCH: 33 pg (ref 26.0–34.0)
MCHC: 33.5 g/dL (ref 30.0–36.0)
MCV: 98.6 fL (ref 80.0–100.0)
Platelets: 137 10*3/uL — ABNORMAL LOW (ref 150–400)
RBC: 4.18 MIL/uL — ABNORMAL LOW (ref 4.22–5.81)
RDW: 15 % (ref 11.5–15.5)
WBC: 8.5 10*3/uL (ref 4.0–10.5)
nRBC: 0 % (ref 0.0–0.2)

## 2023-12-21 LAB — MAGNESIUM: Magnesium: 2 mg/dL (ref 1.7–2.4)

## 2023-12-21 MED ORDER — POTASSIUM CHLORIDE CRYS ER 20 MEQ PO TBCR
EXTENDED_RELEASE_TABLET | ORAL | 1 refills | Status: DC
  Filled 2023-12-21: qty 30, 30d supply, fill #0

## 2023-12-21 MED ORDER — TORSEMIDE 20 MG PO TABS
80.0000 mg | ORAL_TABLET | Freq: Every day | ORAL | 6 refills | Status: DC
Start: 2023-12-25 — End: 2024-01-29
  Filled 2023-12-21: qty 120, 30d supply, fill #0

## 2023-12-21 MED ORDER — ACETAZOLAMIDE 250 MG PO TABS
500.0000 mg | ORAL_TABLET | Freq: Once | ORAL | Status: AC
  Administered 2023-12-21: 500 mg via ORAL
  Filled 2023-12-21: qty 2

## 2023-12-21 MED ORDER — FUROSEMIDE 10 MG/ML IJ SOLN
80.0000 mg | Freq: Once | INTRAMUSCULAR | Status: AC
  Administered 2023-12-21: 80 mg via INTRAVENOUS
  Filled 2023-12-21: qty 8

## 2023-12-21 MED ORDER — FUROSCIX 80 MG/10ML ~~LOC~~ CTKT
80.0000 mg | CARTRIDGE | SUBCUTANEOUS | 3 refills | Status: DC
  Filled 2023-12-21: qty 4, fill #0

## 2023-12-21 MED ORDER — POTASSIUM CHLORIDE CRYS ER 20 MEQ PO TBCR
40.0000 meq | EXTENDED_RELEASE_TABLET | Freq: Once | ORAL | Status: AC
  Administered 2023-12-21: 40 meq via ORAL
  Filled 2023-12-21: qty 2

## 2023-12-21 MED ORDER — FUROSCIX 80 MG/10ML ~~LOC~~ CTKT: CARTRIDGE | SUBCUTANEOUS | Status: DC

## 2023-12-21 NOTE — Plan of Care (Signed)
   Problem: Health Behavior/Discharge Planning: Goal: Ability to manage health-related needs will improve Outcome: Progressing   Problem: Clinical Measurements: Goal: Will remain free from infection Outcome: Progressing

## 2023-12-21 NOTE — TOC Progression Note (Signed)
 Transition of Care (TOC) - Progression Note    Patient Details  Name: Joseph [name]. MRN: [id number] Date of Birth: [birth year]  Transition [facility]) CM/SW Contact  Arlana JINNY Nicholaus [place] Phone Number: [phone] 12/21/2023, 12:08 PM  Clinical Narrative:   HF CSW/NCM will continue to follow and monitor for safety dc planning.   TOC will continue following.     Expected Discharge Plan: Home/Self Care Barriers to Discharge: Continued Medical Work up  Expected Discharge Plan and Services   Discharge Planning Services: CM Consult   Living arrangements for the past 2 months: Single Family Home                                       Social Determinants of Health (SDOH) Interventions SDOH Screenings   Food Insecurity: No Food Insecurity (12/18/2023)  Housing: Low Risk  (12/18/2023)  Transportation Needs: No Transportation Needs (12/18/2023)  Utilities: Not At Risk (12/18/2023)  Depression (PHQ2-9): Medium Risk (06/27/2023)  Financial Resource Strain: Low Risk  (12/31/2020)   Received from [facility]  Physical Activity: Inactive (12/31/2020)   Received from [facility]  Social Connections: Unknown (11/01/2021)   Received from [facility]  Stress: No Stress Concern Present (12/31/2020)   Received from [facility]  Tobacco Use: Medium Risk (12/18/2023)    Readmission Risk Interventions     No data to display

## 2023-12-21 NOTE — Discharge Summary (Addendum)
 Advanced Heart Failure Team  Discharge Summary   Patient ID: Joseph [name]. MRN: [id number], DOB/AGE: 1965-11-01 58 y.o. Admit date: 12/18/2023 D/C date:     12/21/2023   Primary Discharge Diagnoses:  Acute on Chronic HFpEF  AL cardiac amyloid Orthostatic hypotension  Secondary Discharge Diagnoses:  CAD H/o CVA/TIA Nephrotic Syndrome Chronic Back Pain Alpha Gal  Hospital Course:   58 y.o. 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 Now undergoing treatment for Myeloma with Daratumumab  and cytoxan  every 4 weeks.   Directly admitted from [facility] for hypotension and anasarca. Diuresis with IV Lasix  and BP management tedious, although well managed. PICC line placed showing improvement in intravascular hypervolemia. However given nephrotic syndrome, there still is significant amount of interstitial fluid. Hospital stay has been complicated by poor sleep and worsening of chronic back pain in hospital bed. Patient and wife monitor and manage blood pressure well at home and patient has another chemotherapy infusion scheduled for Monday. Will discharge home today to continue diuresis at home with daily leg wraps and SQ Furoscix . Education provided to patient and wife, who feel comfortable with furoscix  at home. Will have very close follow up in [facility] on 7/10.  Discharge Weight: 286 lb Discharge Vitals: Blood pressure (!) 88/78, pulse 85, temperature 97.7 F (36.5 C), temperature source Oral, resp. rate 20, height 6' (1.829 m), weight 129.7 kg, SpO2 93%.  Labs: Lab Results  Component Value Date   WBC 8.5 12/21/2023   HGB 13.8 12/21/2023   HCT 41.2 12/21/2023   MCV 98.6 12/21/2023   PLT 137 (L) 12/21/2023    Recent Labs  Lab 12/18/23 1029 12/19/23 0141 12/21/23 0500  NA 140   < > 138  K 4.0   < > 3.8  CL  105   < > 102  CO2 33*   < > 31  BUN 18   < > 19  CREATININE 0.66   < > 0.71  CALCIUM  7.5*   < > 7.5*  PROT 3.6*  --   --   BILITOT 0.3  --   --   ALKPHOS 85  --   --   ALT 107*  --   --   AST 89*  --   --   GLUCOSE 118*   < > 88   < > = values in this interval not displayed.   Lab Results  Component Value Date   CHOL 256 (H) 06/16/2023   HDL 42 06/16/2023   LDLCALC 179 (H) 06/16/2023   TRIG 174 (H) 06/16/2023   BNP (last 3 results) Recent Labs    05/22/23 1627 06/15/23 1235 10/01/23 1447  BNP 632.9* 316.7* 374.3*   ProBNP (last 3 results) Recent Labs    08/19/23 0837  PROBNP 44   Diagnostic Studies/Procedures   No results found.  Discharge Medications   Allergies as of 12/21/2023       Reactions   Alpha-gal Anaphylaxis   Any mammalian meat or products   Beef-derived Drug Products    No beef  alpha -gal allergy   Penicillins Anaphylaxis   **CEFAZOLIN  received on 02/07/2014 and 04/08/2015 with no documented ADRs**   Pork-derived Products Other (See Comments)   No pork  alpha-gal allergy   Zestril  [lisinopril ] Anaphylaxis   Gadavist  [gadobutrol ] Nausea And  Vomiting   Pt vomits with Gadavist  contrast 09/14/2022 during cardiac MRI scan    Ms Contin  [morphine ] Anxiety, Other (See Comments)   Panic attacks   Tetanus Toxoid Other (See Comments)   Unknown reaction        Medication List     TAKE these medications    acyclovir  400 MG tablet Commonly known as: ZOVIRAX  Take 1 tablet (400 mg total) by mouth 2 (two) times daily.   alfuzosin  10 MG 24 hr tablet Commonly known as: UROXATRAL  Take 1 tablet (10 mg total) by mouth daily.   aspirin  EC 81 MG tablet Take 1 tablet (81 mg total) by mouth daily. Swallow whole.   B-12 PO Take 1 tablet by mouth daily.   busPIRone  15 MG tablet Commonly known as: BUSPAR  Take 15 mg by mouth 2 (two) times daily.   clopidogrel  75 MG tablet Commonly known as: PLAVIX  Take 75 mg by mouth daily.   droxidopa  100 MG  Caps Commonly known as: NORTHERA  Take 1 capsule (100 mg total) by mouth 3 (three) times daily with meals.   EPINEPHrine  0.3 mg/0.3 mL Soaj injection Commonly known as: EpiPen  2-Pak Inject 0.3 mLs (0.3 mg total) into the muscle as needed for anaphylaxis.   ezetimibe  10 MG tablet Commonly known as: ZETIA  Take 1 tablet (10 mg total) by mouth daily.   Furoscix  80 MG/10ML Ctkt Generic drug: Furosemide  Take as directed by the heart failure clinic: Friday 7/4 - apply twice daily after AM and PM midodrine  dose. Take 40mEq potassium with each dose.  Saturday 7/5 - apply once in AM after midodrine  dose. Take 40 mEq potassium. Sunday 7/6 - apply once in AM after midodrine  dose. Take 40 mEq potassium.   levothyroxine  200 MCG tablet Commonly known as: SYNTHROID  Take 1 tablet (200 mcg total) by mouth daily before breakfast.   midodrine  10 MG tablet Commonly known as: PROAMATINE  Take 2 tablets (20 mg total) by mouth 3 (three) times daily.   Multivitamin Men 50+ Tabs Take 1 tablet by mouth daily.   nitroGLYCERIN  0.4 MG SL tablet Commonly known as: NITROSTAT  Place 0.4 mg under the tongue every 5 (five) minutes as needed for chest pain.   ondansetron  8 MG tablet Commonly known as: Zofran  Take 8 mg by mouth 30 to 60 min prior to Cyclophosphamide  administration then take 8 mg every 8 hrs as needed for nausea and vomiting.   Oxycodone  HCl 20 MG Tabs Take 1 tablet (20 mg total) by mouth as directed. What changed:  when to take this reasons to take this   polyethylene glycol 17 g packet Commonly known as: MIRALAX  / GLYCOLAX  Take 17 g by mouth daily as needed for moderate constipation.   potassium chloride  SA 20 MEQ tablet Commonly known as: KLOR-CON  M Take 40 mEq (2 tablets) with each Furoscix  SQ injection dose. Do not take daily.   prochlorperazine  10 MG tablet Commonly known as: COMPAZINE  Take 1 tablet (10 mg total) by mouth every 6 (six) hours as needed for nausea or vomiting.    rosuvastatin  40 MG tablet Commonly known as: CRESTOR  Take 1 tablet (40 mg total) by mouth daily.   sennosides-docusate sodium  8.6-50 MG tablet Commonly known as: SENOKOT-S Take 2 tablets by mouth daily. Right after chemo   torsemide  20 MG tablet Commonly known as: DEMADEX  Take 4 tablets (80 mg total) by mouth daily. Stop furosemide . Start taking on: December 25, 2023   traZODone  50 MG tablet Commonly known as: DESYREL  Take 1 tablet (  50 mg total) by mouth at bedtime as needed for sleep. What changed: how much to take   VITAMIN C  PO Take 1 tablet by mouth daily.   VITAMIN D-3 PO Take 1 capsule by mouth at bedtime.               Durable Medical Equipment  (From admission, onward)           Start     Ordered   12/19/23 1300  For home use only DME 4 wheeled rolling walker with seat  Once       Question Answer Comment  Patient needs a walker to treat with the following condition Heart failure [facility])   Patient needs a walker to treat with the following condition Physical deconditioning      12/19/23 1300            Disposition   The patient will be discharged in stable condition to home. Discharge Instructions     (HEART FAILURE PATIENTS) Call MD:  Anytime you have any of the following symptoms: 1) 3 pound weight gain in 24 hours or 5 pounds in 1 week 2) shortness of breath, with or without a dry hacking cough 3) swelling in the hands, feet or stomach 4) if you have to sleep on extra pillows at night in order to breathe.   Complete by: As directed    Diet - low sodium heart healthy   Complete by: As directed    Fluid restriction 1.8L daily   Heart Failure patients record your daily weight using the same scale at the same time of day   Complete by: As directed    Increase activity slowly   Complete by: As directed    PICC line removal   Complete by: As directed        Follow-up Information     [name], MD. Go to.   Why: 12/27/2023 @1145   A.M. Contact information: [address], [place], [place] [address], [place]        [facility] Follow up on 12/28/2023.   Specialty: Cardiology Why: at 10am Advanced Heart Failure Clinic Free Valet Parking Available Contact information: [address] [place]  [address] [phone]                  Duration of Discharge Encounter: 35 minutes    [place] Lee, NP 12/21/2023, 3:25 PM  Patient seen with APP, plan extensively discussed.   Subjective: - Very anxious to go home now   Exam: Blood pressure (!) 88/65, pulse 85, temperature 97.7 F (36.5 C), temperature source Oral, resp. rate 20, height 6' (1.829 m), weight 129.7 kg, SpO2 93%.  GENERAL: NAD Lungs- normal work of breathing CARDIAC:  JVP: difficult to visualize          Normal rate with regular rhythm.  ABDOMEN: Soft, non-tender, non-distended.  EXTREMITIES: Warm and well perfused.  NEUROLOGIC: No obvious FND  A/P - Extensive discussion with patient & wife at bedside; we discussed dosing of diuretics / furoscix  over the next several days; educated on importance of fluid restriction at home. Will arrange 1 week follow up. Arranging for furoscix  samples.    Tamaiya Bump Advanced Heart Failure  D/C time > 35 mins

## 2023-12-21 NOTE — Progress Notes (Signed)
 Rx profiled for Furoscix , was cost prohibitive and samples will be provided to patient at the HF clinic.

## 2023-12-21 NOTE — Plan of Care (Signed)
  Problem: Education: Goal: Knowledge of General Education information will improve Description: Including pain rating scale, medication(s)/side effects and non-pharmacologic comfort measures 12/21/2023 1529 by Haroldine Birdena BRAVO, RN Outcome: Adequate for Discharge 12/21/2023 1352 by Haroldine Birdena BRAVO, RN Outcome: Progressing   Problem: Health Behavior/Discharge Planning: Goal: Ability to manage health-related needs will improve 12/21/2023 1529 by Haroldine Birdena BRAVO, RN Outcome: Adequate for Discharge 12/21/2023 1352 by Haroldine Birdena BRAVO, RN Outcome: Progressing

## 2023-12-21 NOTE — TOC Transition Note (Signed)
 Transition of Care [facility]) - Discharge Note   Patient Details  Name: Joseph [name]. MRN: [id number] Date of Birth: 1965-12-09  Transition of Care [facility]) CM/SW Contact:  Justina Delcia Czar, RN Phone Number: [phone] 12/21/2023, 4:34 PM   Clinical Narrative:     Wife will provide transportation home.   Final next level of care: Home/Self Care Barriers to Discharge: No Barriers Identified   Patient Goals and CMS Choice Patient states their goals for this hospitalization and ongoing recovery are:: wants to get better          Discharge Placement                       Discharge Plan and Services Additional resources added to the After Visit Summary for     Discharge Planning Services: CM Consult                                 Social Drivers of Health (SDOH) Interventions SDOH Screenings   Food Insecurity: No Food Insecurity (12/18/2023)  Housing: Low Risk  (12/18/2023)  Transportation Needs: No Transportation Needs (12/18/2023)  Utilities: Not At Risk (12/18/2023)  Depression (PHQ2-9): Medium Risk (06/27/2023)  Financial Resource Strain: Low Risk  (12/31/2020)   Received from [facility]  Physical Activity: Inactive (12/31/2020)   Received from [facility]  Social Connections: Unknown (11/01/2021)   Received from [facility]  Stress: No Stress Concern Present (12/31/2020)   Received from [facility]  Tobacco Use: Medium Risk (12/18/2023)     Readmission Risk Interventions     No data to display

## 2023-12-21 NOTE — Progress Notes (Signed)
 Advanced Heart Failure Rounding Note  Cardiologist: Ozell Fell, MD  HF Cardiologist: Dr. Zenaida Chief Complaint: Volume overload Subjective:    [place]ox 71%. CVP 7 with high respiratory variability.  3.1L UOP (net - 1.8L). Weight down 10lbs.   Lying in bed. Slept better overnight, still tired. Wife at bedside.   Objective:    Weight Range: 129.7 kg Body mass index is 38.79 kg/m.   Vital Signs:   Temp:  [97.5 F (36.4 C)-98 F (36.7 C)] 98 F (36.7 C) (07/03 0737) Pulse Rate:  [72-86] 85 (07/03 0737) Resp:  [18-20] 20 (07/03 0737) BP: (72-196)/(52-172) 196/172 (07/03 0737) SpO2:  [90 %-95 %] 95 % (07/03 0737) Weight:  [129.7 kg] 129.7 kg (07/03 0427) Last BM Date : 12/20/23  Weight change: Filed Weights   12/19/23 0443 12/20/23 0420 12/21/23 0427  Weight: 134.4 kg 135.6 kg 129.7 kg   Intake/Output:  Intake/Output Summary (Last 24 hours) at 12/21/2023 0757 Last data filed at 12/21/2023 0739 Gross per 24 hour  Intake 1550 ml  Output 3575 ml  Net -2025 ml    Physical Exam    CVP 7 General: Acutely-ill appearing. No distress on RA Cardiac: JVP difficult to asess. S1 and S2 present. No murmurs or rub. Extremities: Warm and dry.  2+ pitting edema. + unna boots Neuro: Alert and oriented x3. Affect pleasant. Moves all extremities without difficulty. Lines/Devices:  RUE PICC  Telemetry   SR in 70s (personally reviewed)  EKG    No new EKG to review  Labs    CBC Recent Labs    12/18/23 1029 12/19/23 0141 12/20/23 0115 12/21/23 0500  WBC 8.4   < > 14.7* 8.5  NEUTROABS 6.3  --   --   --   HGB 14.2   < > 14.0 13.8  HCT 41.6   < > 40.7 41.2  MCV 95.2   < > 96.9 98.6  PLT 166   < > 161 137*   < > = values in this interval not displayed.   Basic Metabolic Panel Recent Labs    92/98/74 0141 12/20/23 0115  NA 138 141  K 4.3 3.9  CL 104 103  CO2 26 33*  GLUCOSE 134* 121*  BUN 16 19  CREATININE 0.70 0.81  CALCIUM  7.7* 7.4*  MG 1.9 2.0   Liver  Function Tests Recent Labs    12/18/23 1029  AST 89*  ALT 107*  ALKPHOS 85  BILITOT 0.3  PROT 3.6*  ALBUMIN  1.7*   BNP (last 3 results) Recent Labs    05/22/23 1627 06/15/23 1235 10/01/23 1447  BNP 632.9* 316.7* 374.3*   ProBNP (last 3 results) Recent Labs    08/19/23 0837  PROBNP 44   Medications:    Scheduled Medications:  acyclovir   400 mg Oral BID   alfuzosin   10 mg Oral QHS   ascorbic acid  500 mg Oral Daily   aspirin  EC  81 mg Oral Daily   busPIRone   15 mg Oral BID   Chlorhexidine  Gluconate Cloth  6 each Topical Daily   clopidogrel   75 mg Oral Daily   docusate sodium   100 mg Oral Daily   droxidopa   100 mg Oral TID WC   ezetimibe   10 mg Oral QHS   levothyroxine   200 mcg Oral QAC breakfast   midodrine   20 mg Oral QID   multivitamin with minerals  1 tablet Oral Daily   rosuvastatin   40 mg Oral QHS   senna  1 tablet Oral Daily   sodium chloride  flush  10-40 mL Intracatheter Q12H   sodium chloride  flush  3 mL Intravenous Q12H   traZODone   50 mg Oral QHS   Infusions:  albumin  human 12.5 g (12/20/23 0914)   furosemide  120 mg (12/20/23 1739)   PRN Medications: acetaminophen , ALPRAZolam , doxylamine  (Sleep), ondansetron  (ZOFRAN ) IV, oxyCODONE , polyethylene glycol, prochlorperazine , sodium chloride  flush, sodium chloride  flush  Patient Profile   58 y.o. 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 Now undergoing treatment for Myeloma with Daratumumab  and cytoxan  every 4 weeks.   [facility] for [facility]ension and edema.   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 Suspect AL amyloid with obstructive physiology. Initially thought to be  HOCM d/t LVOT obstruction s/p myectomy at [facility] in 11/24. [place]-red positive, no specific amyloid protein identified on path. Now on chemotherapy: daratumumab  and cytoxan  once weekly.  - repeat echo 11/28/23: EF 50-55%, mod LVH, RWMA. - admit with severe anasarca and hypervolemia - complicated by nephrotic syndrome and debilitating orthostatic hypotension.  - CVP 7 with high respiratory variability. Continue Lasix  120 mg IV this morning - continue midodrine  20 mg qid and droxidopa  100 mg tid. Hold midodrine  for SBP >120 - refuses abdominal binder d/t claustrophobia - UNNA boots   Autonomic neuropathy Orthostatic Hypotension Worsened since starting chemotherapy. 3 syncopal events in the last couple months, often is not able to stand to do ADLs at home.  - continue midodrine  20 mg qid + droxidopa  100 mg tid - receiving weekly albumin  25 mg IV in OP - can consider pyridostigmine as next step   CAD s/p single vessel CABG: SVG to RCA. Prior LAD PCI in 2015. - Continue Plavix . On ASA for h/o CVA.   H/o CVA:  - No evidence of atrial fibrillation since admission in 11/24 - ASA/plavix , statin, and zetia  as above   Nephrotic syndrome: In the setting of myeloma and AL amyloid. - Follows with nephrology; getting OP albumin  q weekly - albumin  1.7   Chronic Back Pain - continue home oxy IR 20 mg q6hr as needed   Alpha Gal - avoid red meat; medication monitoring  Length of Stay: 3  [place] Arno Cullers, NP  12/21/2023, 7:57 AM  Advanced Heart Failure Team Pager [phone] (M-F; 7a - 5p)  Please contact [facility] for night-coverage after hours (5p -7a ) and weekends on [email or web address]

## 2023-12-21 NOTE — Telephone Encounter (Signed)
 Advanced Heart Failure Patient Advocate Encounter  Prior Authorization for Furoscix  has been approved.    PA# [id number] Effective dates: 12/21/23 through 06/19/24  Patients co-pay is $295/1 kit; $947/4 kits  Since copay is prohibitive and all grants closed, patient will be given samples per HF Clinic.    Tinnie Redman, PharmD, BCPS, BCCP, CPP Heart Failure Clinic Pharmacist [phone]

## 2023-12-21 NOTE — Progress Notes (Signed)
 Fursocix ordered

## 2023-12-21 NOTE — Telephone Encounter (Signed)
 Patient Advocate Encounter   Received notification from [employer] that prior authorization for Furoscix  is required.   PA submitted on CoverMyMeds Key [id number] Status is pending   Will continue to follow.   Tinnie Redman, PharmD, BCPS, BCCP, CPP Heart Failure Clinic Pharmacist [phone]

## 2023-12-25 ENCOUNTER — Telehealth: Payer: Self-pay

## 2023-12-25 ENCOUNTER — Ambulatory Visit: Payer: Self-pay | Admitting: Urology

## 2023-12-25 NOTE — Transitions of Care (Post Inpatient/ED Visit) (Signed)
   12/25/2023  Name: Joseph [name]. MRN: [id number] DOB: 09-28-1965  Today's TOC FU Call Status: Today's TOC FU Call Status:: Unsuccessful Call (1st Attempt) Unsuccessful Call (1st Attempt) Date: 12/25/23  Attempted to reach the patient regarding the most recent Inpatient/ED visit.  Follow Up Plan: Additional outreach attempts will be made to reach the patient to complete the Transitions of Care (Post Inpatient/ED visit) call.    Bing Edison MSN, RN RN Case Sales executive Health  [facility]Population [facility] Office Hours M-F [phone] Direct Dial: [phone] Main Phone [phone]  Fax: [phone] [email or web address]

## 2023-12-26 ENCOUNTER — Inpatient Hospital Stay

## 2023-12-26 ENCOUNTER — Telehealth: Payer: Self-pay

## 2023-12-26 ENCOUNTER — Inpatient Hospital Stay: Attending: Internal Medicine

## 2023-12-26 VITALS — BP 115/81 | HR 66 | Temp 98.0°F | Resp 18 | Wt 276.5 lb

## 2023-12-26 DIAGNOSIS — F17290 Nicotine dependence, other tobacco product, uncomplicated: Secondary | ICD-10-CM | POA: Diagnosis not present

## 2023-12-26 DIAGNOSIS — Z5112 Encounter for antineoplastic immunotherapy: Secondary | ICD-10-CM | POA: Insufficient documentation

## 2023-12-26 DIAGNOSIS — C9000 Multiple myeloma not having achieved remission: Secondary | ICD-10-CM

## 2023-12-26 DIAGNOSIS — E8581 Light chain (AL) amyloidosis: Secondary | ICD-10-CM | POA: Diagnosis not present

## 2023-12-26 DIAGNOSIS — Z7902 Long term (current) use of antithrombotics/antiplatelets: Secondary | ICD-10-CM | POA: Insufficient documentation

## 2023-12-26 DIAGNOSIS — Z79890 Hormone replacement therapy (postmenopausal): Secondary | ICD-10-CM | POA: Diagnosis not present

## 2023-12-26 DIAGNOSIS — Z5111 Encounter for antineoplastic chemotherapy: Secondary | ICD-10-CM | POA: Diagnosis not present

## 2023-12-26 DIAGNOSIS — I959 Hypotension, unspecified: Secondary | ICD-10-CM

## 2023-12-26 DIAGNOSIS — E039 Hypothyroidism, unspecified: Secondary | ICD-10-CM | POA: Diagnosis not present

## 2023-12-26 DIAGNOSIS — Z79624 Long term (current) use of inhibitors of nucleotide synthesis: Secondary | ICD-10-CM | POA: Insufficient documentation

## 2023-12-26 DIAGNOSIS — Z79899 Other long term (current) drug therapy: Secondary | ICD-10-CM | POA: Diagnosis not present

## 2023-12-26 DIAGNOSIS — Z7982 Long term (current) use of aspirin: Secondary | ICD-10-CM | POA: Diagnosis not present

## 2023-12-26 LAB — CBC WITH DIFFERENTIAL (CANCER CENTER ONLY)
Abs Immature Granulocytes: 0.02 K/uL (ref 0.00–0.07)
Basophils Absolute: 0 K/uL (ref 0.0–0.1)
Basophils Relative: 0 %
Eosinophils Absolute: 0.1 K/uL (ref 0.0–0.5)
Eosinophils Relative: 1 %
HCT: 42.2 % (ref 39.0–52.0)
Hemoglobin: 14.8 g/dL (ref 13.0–17.0)
Immature Granulocytes: 0 %
Lymphocytes Relative: 22 %
Lymphs Abs: 1.8 K/uL (ref 0.7–4.0)
MCH: 33 pg (ref 26.0–34.0)
MCHC: 35.1 g/dL (ref 30.0–36.0)
MCV: 94.2 fL (ref 80.0–100.0)
Monocytes Absolute: 0.7 K/uL (ref 0.1–1.0)
Monocytes Relative: 8 %
Neutro Abs: 5.7 K/uL (ref 1.7–7.7)
Neutrophils Relative %: 69 %
Platelet Count: 171 K/uL (ref 150–400)
RBC: 4.48 MIL/uL (ref 4.22–5.81)
RDW: 14.7 % (ref 11.5–15.5)
WBC Count: 8.2 K/uL (ref 4.0–10.5)
nRBC: 0 % (ref 0.0–0.2)

## 2023-12-26 LAB — CMP (CANCER CENTER ONLY)
ALT: 58 U/L — ABNORMAL HIGH (ref 0–44)
AST: 53 U/L — ABNORMAL HIGH (ref 15–41)
Albumin: 1.8 g/dL — ABNORMAL LOW (ref 3.5–5.0)
Alkaline Phosphatase: 73 U/L (ref 38–126)
Anion gap: 3 — ABNORMAL LOW (ref 5–15)
BUN: 23 mg/dL — ABNORMAL HIGH (ref 6–20)
CO2: 31 mmol/L (ref 22–32)
Calcium: 7.6 mg/dL — ABNORMAL LOW (ref 8.9–10.3)
Chloride: 106 mmol/L (ref 98–111)
Creatinine: 0.57 mg/dL — ABNORMAL LOW (ref 0.61–1.24)
GFR, Estimated: >60 mL/min (ref >60)
Glucose, Bld: 107 mg/dL — ABNORMAL HIGH (ref 70–99)
Potassium: 3.9 mmol/L (ref 3.5–5.1)
Sodium: 140 mmol/L (ref 135–145)
Total Bilirubin: 0.3 mg/dL (ref 0.0–1.2)
Total Protein: 3.8 g/dL — ABNORMAL LOW (ref 6.5–8.1)

## 2023-12-26 MED ORDER — DEXAMETHASONE 4 MG PO TABS
20.0000 mg | ORAL_TABLET | Freq: Once | ORAL | Status: AC
Start: 2023-12-26 — End: 2023-12-26
  Administered 2023-12-26: 20 mg via ORAL
  Filled 2023-12-26: qty 5

## 2023-12-26 MED ORDER — ALBUMIN HUMAN 25 % IV SOLN
25.0000 g | Freq: Once | INTRAVENOUS | Status: AC
  Administered 2023-12-26: 25 g via INTRAVENOUS
  Filled 2023-12-26: qty 100

## 2023-12-26 MED ORDER — SODIUM CHLORIDE 0.9 % IV SOLN
200.0000 mg/m2 | Freq: Once | INTRAVENOUS | Status: AC
  Administered 2023-12-26: 500 mg via INTRAVENOUS
  Filled 2023-12-26: qty 25

## 2023-12-26 MED ORDER — ACETAMINOPHEN 325 MG PO TABS
650.0000 mg | ORAL_TABLET | Freq: Once | ORAL | Status: AC
  Administered 2023-12-26: 650 mg via ORAL
  Filled 2023-12-26: qty 2

## 2023-12-26 MED ORDER — PALONOSETRON HCL INJECTION 0.25 MG/5ML
0.2500 mg | Freq: Once | INTRAVENOUS | Status: AC
  Administered 2023-12-26: 0.25 mg via INTRAVENOUS
  Filled 2023-12-26: qty 5

## 2023-12-26 MED ORDER — SODIUM CHLORIDE 0.9 % IV SOLN: INTRAVENOUS | Status: DC

## 2023-12-26 MED ORDER — DARATUMUMAB-HYALURONIDASE-FIHJ 1800-30000 MG-UT/15ML ~~LOC~~ SOLN
1800.0000 mg | Freq: Once | SUBCUTANEOUS | Status: AC
  Administered 2023-12-26: 1800 mg via SUBCUTANEOUS
  Filled 2023-12-26: qty 15

## 2023-12-26 MED ORDER — DIPHENHYDRAMINE HCL 25 MG PO CAPS
50.0000 mg | ORAL_CAPSULE | Freq: Once | ORAL | Status: AC
  Administered 2023-12-26: 50 mg via ORAL
  Filled 2023-12-26: qty 2
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
## 2023-12-26 NOTE — Transitions of Care (Post Inpatient/ED Visit) (Signed)
   12/26/2023  Name: Joseph [name]. MRN: [id number] DOB: 1966-06-12  Today's TOC FU Call Status: Today's TOC FU Call Status:: Successful TOC FU Call Completed TOC FU Call Complete Date: 12/26/23 Patient's Name and Date of Birth confirmed.  Transition Care Management Follow-up Telephone Call Date of Discharge: 12/21/23  Spouse answered the phone and asked why I was calling and after explained, she declined the remainder of the call politely stating they had enough follow ups with providers.   Spouse answered the phone as patient was sleeping and she indicated that they have enough providers following them, including heart failure clinic providers, hematology, and PCP. Has PCP appointment 12/27/23. Saw hematologist today. Declined 30 day program and did not complete initial outreach items.   Follow up appointments reviewed: PCP HFU 12/27/23.        Bing Edison MSN, RN RN Case Sales executive Health  [facility] Office Hours M-F [phone] Direct Dial: [phone] Main Phone [phone]  Fax: [phone] [email or web address]

## 2023-12-27 DIAGNOSIS — E1122 Type 2 diabetes mellitus with diabetic chronic kidney disease: Secondary | ICD-10-CM | POA: Diagnosis not present

## 2023-12-27 DIAGNOSIS — K760 Fatty (change of) liver, not elsewhere classified: Secondary | ICD-10-CM | POA: Diagnosis not present

## 2023-12-27 DIAGNOSIS — E782 Mixed hyperlipidemia: Secondary | ICD-10-CM | POA: Diagnosis not present

## 2023-12-27 DIAGNOSIS — E039 Hypothyroidism, unspecified: Secondary | ICD-10-CM | POA: Diagnosis not present

## 2023-12-27 LAB — KAPPA/LAMBDA LIGHT CHAINS
Kappa free light chain: 7.2 mg/L (ref 3.3–19.4)
Kappa, lambda light chain ratio: 0.25 — ABNORMAL LOW (ref 0.26–1.65)
Lambda free light chains: 28.7 mg/L — ABNORMAL HIGH (ref 5.7–26.3)

## 2023-12-28 ENCOUNTER — Encounter (HOSPITAL_COMMUNITY): Payer: Self-pay

## 2023-12-28 ENCOUNTER — Other Ambulatory Visit (HOSPITAL_COMMUNITY): Payer: Self-pay

## 2023-12-28 ENCOUNTER — Ambulatory Visit (HOSPITAL_COMMUNITY)
Admit: 2023-12-28 | Discharge: 2023-12-28 | Disposition: A | Discharge status: Home / Self Care | Source: Ambulatory Visit | Attending: Cardiology | Admitting: Cardiology

## 2023-12-28 VITALS — BP 72/50 | HR 77 | Wt 285.0 lb

## 2023-12-28 DIAGNOSIS — D472 Monoclonal gammopathy: Secondary | ICD-10-CM | POA: Diagnosis not present

## 2023-12-28 DIAGNOSIS — Z79899 Other long term (current) drug therapy: Secondary | ICD-10-CM | POA: Insufficient documentation

## 2023-12-28 DIAGNOSIS — Z951 Presence of aortocoronary bypass graft: Secondary | ICD-10-CM | POA: Diagnosis not present

## 2023-12-28 DIAGNOSIS — Z8673 Personal history of transient ischemic attack (TIA), and cerebral infarction without residual deficits: Secondary | ICD-10-CM | POA: Insufficient documentation

## 2023-12-28 DIAGNOSIS — I2510 Atherosclerotic heart disease of native coronary artery without angina pectoris: Secondary | ICD-10-CM | POA: Diagnosis not present

## 2023-12-28 DIAGNOSIS — I5032 Chronic diastolic (congestive) heart failure: Secondary | ICD-10-CM

## 2023-12-28 DIAGNOSIS — Z7902 Long term (current) use of antithrombotics/antiplatelets: Secondary | ICD-10-CM | POA: Diagnosis not present

## 2023-12-28 DIAGNOSIS — I110 Hypertensive heart disease with heart failure: Secondary | ICD-10-CM | POA: Insufficient documentation

## 2023-12-28 DIAGNOSIS — Z87891 Personal history of nicotine dependence: Secondary | ICD-10-CM | POA: Diagnosis not present

## 2023-12-28 DIAGNOSIS — I5022 Chronic systolic (congestive) heart failure: Secondary | ICD-10-CM | POA: Diagnosis not present

## 2023-12-28 DIAGNOSIS — N049 Nephrotic syndrome with unspecified morphologic changes: Secondary | ICD-10-CM | POA: Insufficient documentation

## 2023-12-28 DIAGNOSIS — Z7982 Long term (current) use of aspirin: Secondary | ICD-10-CM | POA: Insufficient documentation

## 2023-12-28 DIAGNOSIS — R609 Edema, unspecified: Secondary | ICD-10-CM | POA: Diagnosis not present

## 2023-12-28 NOTE — Progress Notes (Signed)
 Medication Samples have been provided to the patient.  Drug name: FUROSCIX        Strength: 80 MG /10 ML        Qty: 2  LOT: [id number]  Exp.Date: 11/17/24  Dosing instructions: TAKE AS DIRECTED BY THE HEART FAILURE CLINIC  The patient has been instructed regarding the correct time, dose, and frequency of taking this medication, including desired effects and most common side effects.   Lisa HERO Sedra Morfin 10:43 AM 12/28/2023

## 2023-12-28 NOTE — Patient Instructions (Signed)
  Ensure you write down the time you start your infusion so that if there is a problem you will know how long the infusion lasted  Use Furoscix  only AS DIRECTED by our office  Dosing Directions:   Day 1= 1 kit today 12/28/23  Day 2= 1 kit  12/29/23  RESTART Torsemide  on Saturday 12/30/23.  KEEP FOLLOW UP AS SCHEDULED ON 07/28.  If you have any questions or concerns before your next appointment please send us  a message through [place] or call our office at [phone].    TO LEAVE A MESSAGE FOR THE NURSE SELECT OPTION 2, PLEASE LEAVE A MESSAGE INCLUDING: YOUR NAME DATE OF BIRTH CALL BACK NUMBER REASON FOR CALL**this is important as we prioritize the call backs  YOU WILL RECEIVE A CALL BACK THE SAME DAY AS LONG AS YOU CALL BEFORE 4:00 PM  At the [facility], you and your health needs are our priority. As part of our continuing mission to provide you with exceptional heart care, we have created designated Provider Care Teams. These Care Teams include your primary Cardiologist (physician) and Advanced Practice Providers (APPs- Physician Assistants and Nurse Practitioners) who all work together to provide you with the care you need, when you need it.   You may see any of the following providers on your designated Care Team at your next follow up: Dr Toribio Fuel Dr Ezra Shuck Dr. Ria Commander Dr. [name] Amy Lenetta, NP Caffie Shed, [place] [facility] [place], [place] Beckey Coe, NP [place] Lee, NP Ellouise Class, NP Tinnie Redman, PharmD Jaun Bash, PharmD   Please be sure to bring in all your medications bottles to every appointment.    Thank you for choosing [facility]

## 2023-12-29 LAB — MULTIPLE MYELOMA PANEL, SERUM
Albumin SerPl Elph-Mcnc: 1.5 g/dL — ABNORMAL LOW (ref 2.9–4.4)
Albumin/Glob SerPl: 0.8 (ref 0.7–1.7)
Alpha 1: 0.1 g/dL (ref 0.0–0.4)
Alpha2 Glob SerPl Elph-Mcnc: 1.2 g/dL — ABNORMAL HIGH (ref 0.4–1.0)
B-Globulin SerPl Elph-Mcnc: 0.6 g/dL — ABNORMAL LOW (ref 0.7–1.3)
Gamma Glob SerPl Elph-Mcnc: 0.1 g/dL — ABNORMAL LOW (ref 0.4–1.8)
Globulin, Total: 2 g/dL — ABNORMAL LOW (ref 2.2–3.9)
IgA: 40 mg/dL — ABNORMAL LOW (ref 90–386)
IgG (Immunoglobin G), Serum: 161 mg/dL — ABNORMAL LOW (ref 603–1613)
IgM (Immunoglobulin M), Srm: 45 mg/dL (ref 20–172)
M Protein SerPl Elph-Mcnc: 0 g/dL
Total Protein ELP: 3.5 g/dL — ABNORMAL LOW (ref 6.0–8.5)

## 2024-01-02 ENCOUNTER — Other Ambulatory Visit (HOSPITAL_COMMUNITY): Payer: Self-pay

## 2024-01-02 ENCOUNTER — Inpatient Hospital Stay

## 2024-01-02 VITALS — BP 85/60 | HR 96 | Temp 97.9°F | Resp 18

## 2024-01-02 DIAGNOSIS — Z7982 Long term (current) use of aspirin: Secondary | ICD-10-CM | POA: Diagnosis not present

## 2024-01-02 DIAGNOSIS — Z5112 Encounter for antineoplastic immunotherapy: Secondary | ICD-10-CM | POA: Diagnosis not present

## 2024-01-02 DIAGNOSIS — I959 Hypotension, unspecified: Secondary | ICD-10-CM

## 2024-01-02 DIAGNOSIS — C9000 Multiple myeloma not having achieved remission: Secondary | ICD-10-CM | POA: Diagnosis not present

## 2024-01-02 DIAGNOSIS — Z79624 Long term (current) use of inhibitors of nucleotide synthesis: Secondary | ICD-10-CM | POA: Diagnosis not present

## 2024-01-02 DIAGNOSIS — E039 Hypothyroidism, unspecified: Secondary | ICD-10-CM | POA: Diagnosis not present

## 2024-01-02 DIAGNOSIS — E8809 Other disorders of plasma-protein metabolism, not elsewhere classified: Secondary | ICD-10-CM

## 2024-01-02 DIAGNOSIS — Z5111 Encounter for antineoplastic chemotherapy: Secondary | ICD-10-CM | POA: Diagnosis not present

## 2024-01-02 DIAGNOSIS — Z79899 Other long term (current) drug therapy: Secondary | ICD-10-CM | POA: Diagnosis not present

## 2024-01-02 DIAGNOSIS — E8581 Light chain (AL) amyloidosis: Secondary | ICD-10-CM | POA: Diagnosis not present

## 2024-01-02 DIAGNOSIS — Z7902 Long term (current) use of antithrombotics/antiplatelets: Secondary | ICD-10-CM | POA: Diagnosis not present

## 2024-01-02 DIAGNOSIS — F17290 Nicotine dependence, other tobacco product, uncomplicated: Secondary | ICD-10-CM | POA: Diagnosis not present

## 2024-01-02 MED ORDER — SODIUM CHLORIDE 0.9 % IV SOLN: INTRAVENOUS | Status: DC

## 2024-01-02 MED ORDER — ALBUMIN HUMAN 25 % IV SOLN
25.0000 g | Freq: Once | INTRAVENOUS | Status: AC
  Administered 2024-01-02: 25 g via INTRAVENOUS
  Filled 2024-01-02: qty 100

## 2024-01-02 NOTE — Patient Instructions (Signed)
 Albumin  Injection What is this medication? ALBUMIN  (al BYOO min) treats low blood volume, which may occur with severe dehydration or bleeding. It works by increasing blood volume so your heart can pump blood to the rest of your body. It may also be used to treat low albumin  levels caused by surgery, infection, or other health conditions. Albumin  is a protein that helps your body balance the level of fluid in your blood vessels. This helps maintain a healthy blood pressure and prevents swelling or edema. This medicine may be used for other purposes; ask your health care provider or pharmacist if you have questions. COMMON BRAND NAME(S): Albuked , Albumarc, Albuminar, Albuminex , AlbuRx , Albutein , Buminate, Flexbumin , Kedbumin , Macrotec, Plasbumin , Plasbumin -20 What should I tell my care team before I take this medication? They need to know if you have any of these conditions: Heart disease Kidney disease Low red blood cell levels (anemia) An unusual or allergic reaction to albumin , other medications, foods, dyes, or preservatives Pregnant or trying to get pregnant Breastfeeding How should I use this medication? This medication is infused into a vein. It is given by your care team in a hospital or clinic setting. Talk to your care team about the use of this medication in children. While it may be given to children for selected conditions, precautions do apply. Overdosage: If you think you have taken too much of this medicine contact a poison control center or emergency room at once. NOTE: This medicine is only for you. Do not share this medicine with others. What if I miss a dose? This does not apply. What may interact with this medication? Interactions are not expected. This list may not describe all possible interactions. Give your health care provider a list of all the medicines, herbs, non-prescription drugs, or dietary supplements you use. Also tell them if you smoke, drink alcohol, or use  illegal drugs. Some items may interact with your medicine. What should I watch for while using this medication? Your condition will be monitored carefully while you are receiving this medication. This product is derived from human plasma. Talk to your care team about the risks and benefits of this medication. What side effects may I notice from receiving this medication? Side effects that you should report to your care team as soon as possible: Allergic reactions--skin rash, itching, hives, swelling of the face, lips, tongue, or throat Heart failure--shortness of breath, swelling of the ankles, feet, or hands, sudden weight gain, unusual weakness or fatigue Increase in blood pressure Shortness of breath or trouble breathing, cough, unusual weakness or fatigue, blue skin or lips Side effects that usually do not require medical attention (report these to your care team if they continue or are bothersome): Flushing Headache Nausea This list may not describe all possible side effects. Call your doctor for medical advice about side effects. You may report side effects to FDA at 1-800-FDA-1088. Where should I keep my medication? This medication is given in a hospital or clinic. It will not be stored at home. NOTE: This sheet is a summary. It may not cover all possible information. If you have questions about this medicine, talk to your doctor, pharmacist, or health care provider.  2024 Elsevier/Gold Standard (2023-05-19 00:00:00)

## 2024-01-03 ENCOUNTER — Other Ambulatory Visit: Payer: Self-pay

## 2024-01-04 ENCOUNTER — Other Ambulatory Visit: Payer: Self-pay | Admitting: Hematology

## 2024-01-05 ENCOUNTER — Other Ambulatory Visit (HOSPITAL_COMMUNITY): Payer: Self-pay

## 2024-01-06 ENCOUNTER — Other Ambulatory Visit: Payer: Self-pay
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
## 2024-01-08 ENCOUNTER — Inpatient Hospital Stay (HOSPITAL_BASED_OUTPATIENT_CLINIC_OR_DEPARTMENT_OTHER): Admitting: Hematology

## 2024-01-08 ENCOUNTER — Other Ambulatory Visit

## 2024-01-08 ENCOUNTER — Telehealth (HOSPITAL_COMMUNITY): Payer: Self-pay | Admitting: Pharmacy Technician

## 2024-01-08 ENCOUNTER — Inpatient Hospital Stay

## 2024-01-08 ENCOUNTER — Ambulatory Visit: Admitting: Hematology

## 2024-01-08 ENCOUNTER — Ambulatory Visit

## 2024-01-08 VITALS — BP 83/56 | HR 79 | Temp 97.0°F | Resp 20 | Wt 279.1 lb

## 2024-01-08 VITALS — BP 118/65

## 2024-01-08 DIAGNOSIS — E8581 Light chain (AL) amyloidosis: Secondary | ICD-10-CM

## 2024-01-08 DIAGNOSIS — I959 Hypotension, unspecified: Secondary | ICD-10-CM | POA: Diagnosis not present

## 2024-01-08 DIAGNOSIS — Z7902 Long term (current) use of antithrombotics/antiplatelets: Secondary | ICD-10-CM | POA: Diagnosis not present

## 2024-01-08 DIAGNOSIS — Z5112 Encounter for antineoplastic immunotherapy: Secondary | ICD-10-CM | POA: Diagnosis not present

## 2024-01-08 DIAGNOSIS — F17290 Nicotine dependence, other tobacco product, uncomplicated: Secondary | ICD-10-CM | POA: Diagnosis not present

## 2024-01-08 DIAGNOSIS — C9000 Multiple myeloma not having achieved remission: Secondary | ICD-10-CM | POA: Diagnosis not present

## 2024-01-08 DIAGNOSIS — Z7982 Long term (current) use of aspirin: Secondary | ICD-10-CM | POA: Diagnosis not present

## 2024-01-08 DIAGNOSIS — E8809 Other disorders of plasma-protein metabolism, not elsewhere classified: Secondary | ICD-10-CM | POA: Diagnosis not present

## 2024-01-08 DIAGNOSIS — Z79899 Other long term (current) drug therapy: Secondary | ICD-10-CM | POA: Diagnosis not present

## 2024-01-08 DIAGNOSIS — Z5111 Encounter for antineoplastic chemotherapy: Secondary | ICD-10-CM | POA: Diagnosis not present

## 2024-01-08 DIAGNOSIS — E039 Hypothyroidism, unspecified: Secondary | ICD-10-CM | POA: Diagnosis not present

## 2024-01-08 DIAGNOSIS — Z79624 Long term (current) use of inhibitors of nucleotide synthesis: Secondary | ICD-10-CM | POA: Diagnosis not present

## 2024-01-08 LAB — CBC WITH DIFFERENTIAL (CANCER CENTER ONLY)
Abs Immature Granulocytes: 0.02 K/uL (ref 0.00–0.07)
Basophils Absolute: 0 K/uL (ref 0.0–0.1)
Basophils Relative: 0 %
Eosinophils Absolute: 0.1 K/uL (ref 0.0–0.5)
Eosinophils Relative: 1 %
HCT: 39.9 % (ref 39.0–52.0)
Hemoglobin: 13.9 g/dL (ref 13.0–17.0)
Immature Granulocytes: 0 %
Lymphocytes Relative: 14 %
Lymphs Abs: 1.3 K/uL (ref 0.7–4.0)
MCH: 33.1 pg (ref 26.0–34.0)
MCHC: 34.8 g/dL (ref 30.0–36.0)
MCV: 95 fL (ref 80.0–100.0)
Monocytes Absolute: 0.5 K/uL (ref 0.1–1.0)
Monocytes Relative: 6 %
Neutro Abs: 6.8 K/uL (ref 1.7–7.7)
Neutrophils Relative %: 79 %
Platelet Count: 198 K/uL (ref 150–400)
RBC: 4.2 MIL/uL — ABNORMAL LOW (ref 4.22–5.81)
RDW: 14.7 % (ref 11.5–15.5)
WBC Count: 8.7 K/uL (ref 4.0–10.5)
nRBC: 0 % (ref 0.0–0.2)

## 2024-01-08 LAB — CMP (CANCER CENTER ONLY)
ALT: 62 U/L — ABNORMAL HIGH (ref 0–44)
AST: 64 U/L — ABNORMAL HIGH (ref 15–41)
Albumin: 1.7 g/dL — ABNORMAL LOW (ref 3.5–5.0)
Alkaline Phosphatase: 86 U/L (ref 38–126)
Anion gap: 2 — ABNORMAL LOW (ref 5–15)
BUN: 19 mg/dL (ref 6–20)
CO2: 32 mmol/L (ref 22–32)
Calcium: 7.5 mg/dL — ABNORMAL LOW (ref 8.9–10.3)
Chloride: 106 mmol/L (ref 98–111)
Creatinine: 0.62 mg/dL (ref 0.61–1.24)
GFR, Estimated: >60 mL/min (ref >60)
Glucose, Bld: 116 mg/dL — ABNORMAL HIGH (ref 70–99)
Potassium: 3.9 mmol/L (ref 3.5–5.1)
Sodium: 140 mmol/L (ref 135–145)
Total Bilirubin: 0.3 mg/dL (ref 0.0–1.2)
Total Protein: 3.8 g/dL — ABNORMAL LOW (ref 6.5–8.1)

## 2024-01-08 MED ORDER — ACETAMINOPHEN 325 MG PO TABS
650.0000 mg | ORAL_TABLET | Freq: Once | ORAL | Status: AC
  Administered 2024-01-08: 650 mg via ORAL
  Filled 2024-01-08: qty 2

## 2024-01-08 MED ORDER — DEXAMETHASONE 4 MG PO TABS
20.0000 mg | ORAL_TABLET | Freq: Once | ORAL | Status: AC
Start: 2024-01-08 — End: 2024-01-08
  Administered 2024-01-08: 20 mg via ORAL
  Filled 2024-01-08: qty 5

## 2024-01-08 MED ORDER — DARATUMUMAB-HYALURONIDASE-FIHJ 1800-30000 MG-UT/15ML ~~LOC~~ SOLN
1800.0000 mg | Freq: Once | SUBCUTANEOUS | Status: AC
  Administered 2024-01-08: 1800 mg via SUBCUTANEOUS
  Filled 2024-01-08: qty 15

## 2024-01-08 MED ORDER — ALBUMIN HUMAN 25 % IV SOLN
25.0000 g | Freq: Once | INTRAVENOUS | Status: AC
  Administered 2024-01-08: 25 g via INTRAVENOUS
  Filled 2024-01-08: qty 100

## 2024-01-08 MED ORDER — PALONOSETRON HCL INJECTION 0.25 MG/5ML
0.2500 mg | Freq: Once | INTRAVENOUS | Status: AC
  Administered 2024-01-08: 0.25 mg via INTRAVENOUS
  Filled 2024-01-08: qty 5

## 2024-01-08 MED ORDER — SODIUM CHLORIDE 0.9 % IV SOLN: INTRAVENOUS | Status: DC

## 2024-01-08 MED ORDER — SODIUM CHLORIDE 0.9 % IV SOLN
200.0000 mg/m2 | Freq: Once | INTRAVENOUS | Status: AC
  Administered 2024-01-08: 500 mg via INTRAVENOUS
  Filled 2024-01-08: qty 25

## 2024-01-08 MED ORDER — DIPHENHYDRAMINE HCL 25 MG PO CAPS
50.0000 mg | ORAL_CAPSULE | Freq: Once | ORAL | Status: AC
  Administered 2024-01-08: 50 mg via ORAL
  Filled 2024-01-08: qty 2

## 2024-01-08 NOTE — Patient Instructions (Signed)
 [facility] - A DEPT OF [facility]  Discharge Instructions: Thank you for choosing [facility] to provide your oncology and hematology care.   If you have a lab appointment with the Cancer Center, please go directly to the Cancer Center and check in at the registration area.   Wear comfortable clothing and clothing appropriate for easy access to any Portacath or PICC line.   We strive to give you quality time with your provider. You may need to reschedule your appointment if you arrive late (15 or more minutes).  Arriving late affects you and other patients whose appointments are after yours.  Also, if you miss three or more appointments without notifying the office, you may be dismissed from the clinic at the provider's discretion.      For prescription refill requests, have your pharmacy contact our office and allow 72 hours for refills to be completed.    Today you received the following chemotherapy and/or immunotherapy agents: Cyclophosphamide , Darzalex  Faspro      To help prevent nausea and vomiting after your treatment, we encourage you to take your nausea medication as directed.  BELOW ARE SYMPTOMS THAT SHOULD BE REPORTED IMMEDIATELY: *FEVER GREATER THAN 100.4 F (38 C) OR HIGHER *CHILLS OR SWEATING *NAUSEA AND VOMITING THAT IS NOT CONTROLLED WITH YOUR NAUSEA MEDICATION *UNUSUAL SHORTNESS OF BREATH *UNUSUAL BRUISING OR BLEEDING *URINARY PROBLEMS (pain or burning when urinating, or frequent urination) *BOWEL PROBLEMS (unusual diarrhea, constipation, pain near the anus) TENDERNESS IN MOUTH AND THROAT WITH OR WITHOUT PRESENCE OF ULCERS (sore throat, sores in mouth, or a toothache) UNUSUAL RASH, SWELLING OR PAIN  UNUSUAL VAGINAL DISCHARGE OR ITCHING   Items with * indicate a potential emergency and should be followed up as soon as possible or go to the Emergency Department if any problems should occur.  Please show the CHEMOTHERAPY  ALERT CARD or IMMUNOTHERAPY ALERT CARD at check-in to the Emergency Department and triage nurse.  Should you have questions after your visit or need to cancel or reschedule your appointment, please contact [facility]  Dept: [phone]  and follow the prompts.  Office hours are 8:00 a.m. to 4:30 p.m. Monday - Friday. Please note that voicemails left after 4:00 p.m. may not be returned until the following business day.  We are closed weekends and major holidays. You have access to a nurse at all times for urgent questions. Please call the main number to the clinic Dept: [phone] and follow the prompts.   For any non-urgent questions, you may also contact your provider using MyChart. We now offer e-Visits for anyone 82 and older to request care online for non-urgent symptoms. For details visit [email or web address].   Also download the MyChart app! Go to the app store, search "MyChart", open the app, select [place], and log in with your MyChart username and password.

## 2024-01-08 NOTE — Telephone Encounter (Signed)
 Advanced Heart Failure Patient Advocate Encounter  Spoke with patients wife about possibility of applying for Furosc[employer]  assistance. She states that the patient has one box of samples left and the torsemide  has been helpful. The patient is down 20 pounds.  I advised her that Furoscix  direct requires proof of income, should we need to apply in the future. She voiced understanding.  Almarie JULIANNA Pa, CPhT

## 2024-01-14 ENCOUNTER — Other Ambulatory Visit: Payer: Self-pay

## 2024-01-14 ENCOUNTER — Inpatient Hospital Stay (HOSPITAL_COMMUNITY)
Admission: EM | Admit: 2024-01-14 | Discharge: 2024-01-20 | DRG: 392 | Disposition: A | Discharge status: Home / Self Care | Attending: Internal Medicine | Admitting: Internal Medicine

## 2024-01-14 ENCOUNTER — Emergency Department (HOSPITAL_COMMUNITY)

## 2024-01-14 ENCOUNTER — Encounter (HOSPITAL_COMMUNITY): Payer: Self-pay | Admitting: Emergency Medicine

## 2024-01-14 DIAGNOSIS — Z7902 Long term (current) use of antithrombotics/antiplatelets: Secondary | ICD-10-CM | POA: Diagnosis not present

## 2024-01-14 DIAGNOSIS — Z951 Presence of aortocoronary bypass graft: Secondary | ICD-10-CM

## 2024-01-14 DIAGNOSIS — I959 Hypotension, unspecified: Secondary | ICD-10-CM | POA: Diagnosis present

## 2024-01-14 DIAGNOSIS — R918 Other nonspecific abnormal finding of lung field: Secondary | ICD-10-CM | POA: Diagnosis not present

## 2024-01-14 DIAGNOSIS — Z885 Allergy status to narcotic agent status: Secondary | ICD-10-CM

## 2024-01-14 DIAGNOSIS — Z79899 Other long term (current) drug therapy: Secondary | ICD-10-CM

## 2024-01-14 DIAGNOSIS — Z841 Family history of disorders of kidney and ureter: Secondary | ICD-10-CM

## 2024-01-14 DIAGNOSIS — T451X5A Adverse effect of antineoplastic and immunosuppressive drugs, initial encounter: Secondary | ICD-10-CM | POA: Diagnosis present

## 2024-01-14 DIAGNOSIS — Z79890 Hormone replacement therapy (postmenopausal): Secondary | ICD-10-CM

## 2024-01-14 DIAGNOSIS — J9691 Respiratory failure, unspecified with hypoxia: Secondary | ICD-10-CM | POA: Diagnosis present

## 2024-01-14 DIAGNOSIS — E669 Obesity, unspecified: Secondary | ICD-10-CM | POA: Diagnosis present

## 2024-01-14 DIAGNOSIS — R531 Weakness: Secondary | ICD-10-CM | POA: Diagnosis not present

## 2024-01-14 DIAGNOSIS — E876 Hypokalemia: Secondary | ICD-10-CM | POA: Diagnosis present

## 2024-01-14 DIAGNOSIS — J9611 Chronic respiratory failure with hypoxia: Secondary | ICD-10-CM | POA: Diagnosis not present

## 2024-01-14 DIAGNOSIS — I43 Cardiomyopathy in diseases classified elsewhere: Secondary | ICD-10-CM | POA: Diagnosis not present

## 2024-01-14 DIAGNOSIS — Z8673 Personal history of transient ischemic attack (TIA), and cerebral infarction without residual deficits: Secondary | ICD-10-CM

## 2024-01-14 DIAGNOSIS — G4733 Obstructive sleep apnea (adult) (pediatric): Secondary | ICD-10-CM | POA: Diagnosis present

## 2024-01-14 DIAGNOSIS — I10 Essential (primary) hypertension: Secondary | ICD-10-CM | POA: Diagnosis not present

## 2024-01-14 DIAGNOSIS — I25119 Atherosclerotic heart disease of native coronary artery with unspecified angina pectoris: Secondary | ICD-10-CM | POA: Diagnosis present

## 2024-01-14 DIAGNOSIS — I422 Other hypertrophic cardiomyopathy: Secondary | ICD-10-CM

## 2024-01-14 DIAGNOSIS — Z880 Allergy status to penicillin: Secondary | ICD-10-CM

## 2024-01-14 DIAGNOSIS — I480 Paroxysmal atrial fibrillation: Secondary | ICD-10-CM | POA: Diagnosis present

## 2024-01-14 DIAGNOSIS — I9589 Other hypotension: Secondary | ICD-10-CM | POA: Diagnosis present

## 2024-01-14 DIAGNOSIS — I509 Heart failure, unspecified: Secondary | ICD-10-CM

## 2024-01-14 DIAGNOSIS — R9431 Abnormal electrocardiogram [ECG] [EKG]: Secondary | ICD-10-CM | POA: Diagnosis not present

## 2024-01-14 DIAGNOSIS — Z8249 Family history of ischemic heart disease and other diseases of the circulatory system: Secondary | ICD-10-CM

## 2024-01-14 DIAGNOSIS — T502X6A Underdosing of carbonic-anhydrase inhibitors, benzothiadiazides and other diuretics, initial encounter: Secondary | ICD-10-CM | POA: Diagnosis present

## 2024-01-14 DIAGNOSIS — E039 Hypothyroidism, unspecified: Secondary | ICD-10-CM | POA: Diagnosis present

## 2024-01-14 DIAGNOSIS — K521 Toxic gastroenteritis and colitis: Principal | ICD-10-CM | POA: Diagnosis present

## 2024-01-14 DIAGNOSIS — Z833 Family history of diabetes mellitus: Secondary | ICD-10-CM

## 2024-01-14 DIAGNOSIS — R1110 Vomiting, unspecified: Secondary | ICD-10-CM | POA: Diagnosis not present

## 2024-01-14 DIAGNOSIS — I3139 Other pericardial effusion (noninflammatory): Secondary | ICD-10-CM | POA: Diagnosis present

## 2024-01-14 DIAGNOSIS — Z87891 Personal history of nicotine dependence: Secondary | ICD-10-CM

## 2024-01-14 DIAGNOSIS — Z91148 Patient's other noncompliance with medication regimen for other reason: Secondary | ICD-10-CM

## 2024-01-14 DIAGNOSIS — Z888 Allergy status to other drugs, medicaments and biological substances status: Secondary | ICD-10-CM

## 2024-01-14 DIAGNOSIS — Z955 Presence of coronary angioplasty implant and graft: Secondary | ICD-10-CM

## 2024-01-14 DIAGNOSIS — I110 Hypertensive heart disease with heart failure: Secondary | ICD-10-CM | POA: Diagnosis not present

## 2024-01-14 DIAGNOSIS — I5032 Chronic diastolic (congestive) heart failure: Secondary | ICD-10-CM | POA: Diagnosis present

## 2024-01-14 DIAGNOSIS — I5043 Acute on chronic combined systolic (congestive) and diastolic (congestive) heart failure: Secondary | ICD-10-CM

## 2024-01-14 DIAGNOSIS — N400 Enlarged prostate without lower urinary tract symptoms: Secondary | ICD-10-CM | POA: Diagnosis present

## 2024-01-14 DIAGNOSIS — C9000 Multiple myeloma not having achieved remission: Secondary | ICD-10-CM | POA: Diagnosis not present

## 2024-01-14 DIAGNOSIS — N401 Enlarged prostate with lower urinary tract symptoms: Secondary | ICD-10-CM

## 2024-01-14 DIAGNOSIS — F1120 Opioid dependence, uncomplicated: Secondary | ICD-10-CM | POA: Diagnosis present

## 2024-01-14 DIAGNOSIS — E785 Hyperlipidemia, unspecified: Secondary | ICD-10-CM | POA: Diagnosis not present

## 2024-01-14 DIAGNOSIS — Z887 Allergy status to serum and vaccine status: Secondary | ICD-10-CM

## 2024-01-14 DIAGNOSIS — E8809 Other disorders of plasma-protein metabolism, not elsewhere classified: Secondary | ICD-10-CM | POA: Diagnosis not present

## 2024-01-14 DIAGNOSIS — E854 Organ-limited amyloidosis: Secondary | ICD-10-CM | POA: Diagnosis not present

## 2024-01-14 DIAGNOSIS — Z8349 Family history of other endocrine, nutritional and metabolic diseases: Secondary | ICD-10-CM

## 2024-01-14 DIAGNOSIS — R197 Diarrhea, unspecified: Secondary | ICD-10-CM | POA: Diagnosis not present

## 2024-01-14 DIAGNOSIS — E860 Dehydration: Secondary | ICD-10-CM | POA: Diagnosis not present

## 2024-01-14 DIAGNOSIS — Z87442 Personal history of urinary calculi: Secondary | ICD-10-CM

## 2024-01-14 DIAGNOSIS — F419 Anxiety disorder, unspecified: Secondary | ICD-10-CM | POA: Diagnosis present

## 2024-01-14 DIAGNOSIS — D72829 Elevated white blood cell count, unspecified: Secondary | ICD-10-CM | POA: Diagnosis present

## 2024-01-14 DIAGNOSIS — Z8051 Family history of malignant neoplasm of kidney: Secondary | ICD-10-CM

## 2024-01-14 DIAGNOSIS — Z96642 Presence of left artificial hip joint: Secondary | ICD-10-CM | POA: Diagnosis present

## 2024-01-14 DIAGNOSIS — Z91014 Allergy to mammalian meats: Secondary | ICD-10-CM

## 2024-01-14 DIAGNOSIS — Z6837 Body mass index (BMI) 37.0-37.9, adult: Secondary | ICD-10-CM

## 2024-01-14 DIAGNOSIS — Z7982 Long term (current) use of aspirin: Secondary | ICD-10-CM

## 2024-01-14 DIAGNOSIS — G8929 Other chronic pain: Secondary | ICD-10-CM | POA: Diagnosis present

## 2024-01-14 DIAGNOSIS — A419 Sepsis, unspecified organism: Secondary | ICD-10-CM | POA: Diagnosis not present

## 2024-01-14 LAB — COMPREHENSIVE METABOLIC PANEL WITH GFR
ALT: 59 U/L — ABNORMAL HIGH (ref 0–44)
AST: 59 U/L — ABNORMAL HIGH (ref 15–41)
Albumin: <1.5 g/dL — ABNORMAL LOW (ref 3.5–5.0)
Alkaline Phosphatase: 120 U/L (ref 38–126)
Anion gap: 10 (ref 5–15)
BUN: 23 mg/dL — ABNORMAL HIGH (ref 6–20)
CO2: 30 mmol/L (ref 22–32)
Calcium: 7.6 mg/dL — ABNORMAL LOW (ref 8.9–10.3)
Chloride: 94 mmol/L — ABNORMAL LOW (ref 98–111)
Creatinine, Ser: 1.01 mg/dL (ref 0.61–1.24)
GFR, Estimated: >60 mL/min (ref >60)
Glucose, Bld: 103 mg/dL — ABNORMAL HIGH (ref 70–99)
Potassium: 3.7 mmol/L (ref 3.5–5.1)
Sodium: 134 mmol/L — ABNORMAL LOW (ref 135–145)
Total Bilirubin: 0.9 mg/dL (ref 0.0–1.2)
Total Protein: 4.4 g/dL — ABNORMAL LOW (ref 6.5–8.1)

## 2024-01-14 LAB — CBC WITH DIFFERENTIAL/PLATELET
Abs Immature Granulocytes: 0.07 K/uL (ref 0.00–0.07)
Basophils Absolute: 0 K/uL (ref 0.0–0.1)
Basophils Relative: 0 %
Eosinophils Absolute: 0 K/uL (ref 0.0–0.5)
Eosinophils Relative: 0 %
HCT: 49.1 % (ref 39.0–52.0)
Hemoglobin: 16.8 g/dL (ref 13.0–17.0)
Immature Granulocytes: 1 %
Lymphocytes Relative: 11 %
Lymphs Abs: 1.2 K/uL (ref 0.7–4.0)
MCH: 32.6 pg (ref 26.0–34.0)
MCHC: 34.2 g/dL (ref 30.0–36.0)
MCV: 95.3 fL (ref 80.0–100.0)
Monocytes Absolute: 0.9 K/uL (ref 0.1–1.0)
Monocytes Relative: 8 %
Neutro Abs: 9.3 K/uL — ABNORMAL HIGH (ref 1.7–7.7)
Neutrophils Relative %: 80 %
Platelets: 184 K/uL (ref 150–400)
RBC: 5.15 MIL/uL (ref 4.22–5.81)
RDW: 14.8 % (ref 11.5–15.5)
WBC: 11.6 K/uL — ABNORMAL HIGH (ref 4.0–10.5)
nRBC: 0 % (ref 0.0–0.2)

## 2024-01-14 LAB — CBG MONITORING, ED: Glucose-Capillary: 114 mg/dL — ABNORMAL HIGH (ref 70–99)

## 2024-01-14 LAB — C DIFFICILE QUICK SCREEN W PCR REFLEX
C Diff antigen: NEGATIVE
C Diff interpretation: NOT DETECTED
C Diff toxin: NEGATIVE

## 2024-01-14 LAB — MAGNESIUM: Magnesium: 1.8 mg/dL (ref 1.7–2.4)

## 2024-01-14 LAB — PROTIME-INR
INR: 1 (ref 0.8–1.2)
Prothrombin Time: 13.4 s (ref 11.4–15.2)

## 2024-01-14 LAB — BRAIN NATRIURETIC PEPTIDE: B Natriuretic Peptide: 229.5 pg/mL — ABNORMAL HIGH (ref 0.0–100.0)

## 2024-01-14 MED ORDER — SODIUM CHLORIDE 0.9% FLUSH: 10.0000 mL | INTRAVENOUS | Status: DC | PRN

## 2024-01-14 MED ORDER — EZETIMIBE 10 MG PO TABS
10.0000 mg | ORAL_TABLET | Freq: Every day | ORAL | Status: DC
  Administered 2024-01-15 – 2024-01-19 (×5): 10 mg via ORAL
  Filled 2024-01-14 (×5): qty 1

## 2024-01-14 MED ORDER — CLOPIDOGREL BISULFATE 75 MG PO TABS
75.0000 mg | ORAL_TABLET | Freq: Every day | ORAL | Status: DC
  Administered 2024-01-15 – 2024-01-20 (×6): 75 mg via ORAL
  Filled 2024-01-14 (×6): qty 1

## 2024-01-14 MED ORDER — OXYCODONE HCL 5 MG PO TABS
20.0000 mg | ORAL_TABLET | Freq: Four times a day (QID) | ORAL | Status: DC | PRN
  Administered 2024-01-15: 20 mg via ORAL
  Filled 2024-01-14: qty 4

## 2024-01-14 MED ORDER — ROSUVASTATIN CALCIUM 20 MG PO TABS
40.0000 mg | ORAL_TABLET | Freq: Every day | ORAL | Status: DC
Start: 2024-01-15 — End: 2024-01-20
  Administered 2024-01-15 – 2024-01-19 (×5): 40 mg via ORAL
  Filled 2024-01-14 (×5): qty 2

## 2024-01-14 MED ORDER — DROXIDOPA 100 MG PO CAPS
100.0000 mg | ORAL_CAPSULE | Freq: Three times a day (TID) | ORAL | Status: DC
  Administered 2024-01-17 – 2024-01-20 (×11): 100 mg via ORAL
  Filled 2024-01-14 (×19): qty 1

## 2024-01-14 MED ORDER — LEVOTHYROXINE SODIUM 100 MCG PO TABS
200.0000 ug | ORAL_TABLET | Freq: Every day | ORAL | Status: DC
Start: 2024-01-15 — End: 2024-01-20
  Administered 2024-01-15 – 2024-01-20 (×6): 200 ug via ORAL
  Filled 2024-01-14 (×6): qty 2

## 2024-01-14 MED ORDER — SODIUM CHLORIDE 0.9% FLUSH
3.0000 mL | Freq: Two times a day (BID) | INTRAVENOUS | Status: DC
  Administered 2024-01-14 – 2024-01-19 (×7): 3 mL via INTRAVENOUS

## 2024-01-14 MED ORDER — ASPIRIN 81 MG PO TBEC
81.0000 mg | DELAYED_RELEASE_TABLET | Freq: Every day | ORAL | Status: DC
  Administered 2024-01-15 – 2024-01-20 (×6): 81 mg via ORAL
  Filled 2024-01-14 (×6): qty 1

## 2024-01-14 MED ORDER — ALPRAZOLAM 0.5 MG PO TABS: 0.5000 mg | ORAL_TABLET | Freq: Two times a day (BID) | ORAL | Status: DC | PRN

## 2024-01-14 MED ORDER — ACETAMINOPHEN 650 MG RE SUPP: 650.0000 mg | Freq: Four times a day (QID) | RECTAL | Status: DC | PRN

## 2024-01-14 MED ORDER — POLYETHYLENE GLYCOL 3350 17 G PO PACK: 17.0000 g | PACK | Freq: Every day | ORAL | Status: DC | PRN

## 2024-01-14 MED ORDER — MIDODRINE HCL 5 MG PO TABS
20.0000 mg | ORAL_TABLET | Freq: Three times a day (TID) | ORAL | Status: DC
  Filled 2024-01-14 (×2): qty 4

## 2024-01-14 MED ORDER — ALPRAZOLAM 0.5 MG PO TABS
0.5000 mg | ORAL_TABLET | Freq: Once | ORAL | Status: AC
  Administered 2024-01-14: 0.5 mg via ORAL
  Filled 2024-01-14: qty 1

## 2024-01-14 MED ORDER — ACETAMINOPHEN 325 MG PO TABS
650.0000 mg | ORAL_TABLET | Freq: Four times a day (QID) | ORAL | Status: DC | PRN
  Administered 2024-01-17: 650 mg via ORAL
  Filled 2024-01-14: qty 2

## 2024-01-14 MED ORDER — LOPERAMIDE HCL 2 MG PO CAPS
2.0000 mg | ORAL_CAPSULE | ORAL | Status: DC | PRN
  Administered 2024-01-15 (×4): 2 mg via ORAL
  Filled 2024-01-14 (×4): qty 1

## 2024-01-14 MED ORDER — ALBUMIN HUMAN 25 % IV SOLN
50.0000 g | Freq: Once | INTRAVENOUS | Status: AC
  Administered 2024-01-14: 50 g via INTRAVENOUS
  Filled 2024-01-14: qty 200

## 2024-01-14 NOTE — ED Provider Notes (Signed)
 [place] EMERGENCY DEPARTMENT AT [facility] Provider Note   CSN: [id number] Arrival date & time: 01/14/24  1534     Patient presents with: Diarrhea   Joseph [name]. is a 58 y.o. male w/ hx of HOCM s/p myectomy, CABG in 2024, CVA, heart failure, nephrotic syndrome, presenting to the ED with diarrhea. His wife reports patient has had profuse, persistent diarrhea x 1 week since his last chemo cycle. No fevers.  No blood in stool.  Poor appetite but drinking water.  They have scaled back his diuresis from torsemide  80 mg daily to 0 mg this week due to weight loss.  He has difficulty balancing his fluids and edema at baseline due to his medical comorbidities.  No hx of C Diff per family recollection.  No recent antibiotics since April treatment for PNA.  BP always runs really low.  Family gives midodrine  PRN at home.  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  Last hospitalized 1 month ago for hypotension and anasarca, with difficulty managing diuresis with low BP.   HPI     Prior to Admission medications   Medication Sig Start Date End Date Taking? Authorizing Provider  acyclovir  (ZOVIRAX ) 400 MG tablet Take 1 tablet (400 mg total) by mouth 2 (two) times daily. 10/25/23   Onesimo Emaline Brink, MD  alfuzosin  (UROXATRAL ) 10 MG 24 hr tablet Take 1 tablet (10 mg total) by mouth daily. 09/20/23   Stoioff, Glendia JAYSON, MD  Ascorbic Acid  (VITAMIN C  PO) Take 1 tablet by mouth daily.    [provider]  aspirin  EC 81 MG tablet Take 1 tablet (81 mg total) by mouth daily. Swallow whole. 08/24/23   Trudy Birmingham, PA-C  busPIRone  (BUSPAR ) 15 MG tablet Take 15 mg by mouth 2 (two) times daily.    [provider]  Cholecalciferol  (VITAMIN D -3 PO) Take 1 capsule by mouth at bedtime.    [provider]  clopidogrel  (PLAVIX ) 75 MG tablet Take 75 mg by mouth daily.    [provider]  Cyanocobalamin   (B-12 PO) Take 1 tablet by mouth daily.    [provider]  droxidopa  (NORTHERA ) 100 MG CAPS Take 1 capsule (100 mg total) by mouth 3 (three) times daily with meals. 11/15/23   Zenaida Morene PARAS, MD  EPINEPHrine  (EPIPEN  2-PAK) 0.3 mg/0.3 mL IJ SOAJ injection Inject 0.3 mLs (0.3 mg total) into the muscle as needed for anaphylaxis. 12/16/19   Luke Orlan HERO, DO  ezetimibe  (ZETIA ) 10 MG tablet Take 1 tablet (10 mg total) by mouth daily. 07/07/23   Lucien Orren SAILOR, PA-C  Furosemide  (FUROSCIX ) 80 MG/10ML CTKT Take as directed by the heart failure clinic: Friday 7/4 - apply twice daily after AM and PM midodrine  dose. Take 40mEq potassium with each dose.  Saturday 7/5 - apply once in AM after midodrine  dose. Take 40 mEq potassium. Sunday 7/6 - apply once in AM after midodrine  dose. Take 40 mEq potassium. 12/21/23   Lee, [place], NP  levothyroxine  (SYNTHROID ) 200 MCG tablet Take 1 tablet (200 mcg total) by mouth daily before breakfast. 06/12/23   Therisa Benton PARAS, NP  midodrine  (PROAMATINE ) 10 MG tablet Take 2 tablets (20 mg total) by mouth 3 (three) times daily. 11/14/23   Zenaida Morene PARAS, MD  Multiple Vitamins-Minerals (MULTIVITAMIN MEN 50+) TABS Take 1 tablet by mouth daily.    [provider]  nitroGLYCERIN  (NITROSTAT ) 0.4 MG SL  tablet Place 0.4 mg under the tongue every 5 (five) minutes as needed for chest pain.    [provider]  ondansetron  (ZOFRAN ) 8 MG tablet Take 8 mg by mouth as needed for nausea or vomiting.    [provider]  Oxycodone  HCl 20 MG TABS Take 1 tablet (20 mg total) by mouth as directed. 01/05/22   [name], MD  polyethylene glycol (MIRALAX  / GLYCOLAX ) 17 g packet Take 17 g by mouth daily as needed for moderate constipation.    [provider]  potassium chloride  SA (KLOR-CON  M) 20 MEQ tablet Take 40 mEq (2 tablets) with each Furoscix  SQ injection dose. Do not take daily. 12/21/23   Lee, [place], NP  prochlorperazine  (COMPAZINE ) 10 MG  tablet Take 1 tablet (10 mg total) by mouth every 6 (six) hours as needed for nausea or vomiting. 10/25/23   Onesimo Emaline Brink, MD  rosuvastatin  (CRESTOR ) 40 MG tablet Take 1 tablet (40 mg total) by mouth daily. 07/11/23   Conte, Tessa N, PA-C  senna (SENOKOT) 8.6 MG tablet Take 1 tablet by mouth as needed for constipation.    [provider]  torsemide  (DEMADEX ) 20 MG tablet Take 4 tablets (80 mg total) by mouth daily. Stop furosemide . 12/25/23   Lee, [place], NP  traZODone  (DESYREL ) 50 MG tablet Take 1 tablet (50 mg total) by mouth at bedtime as needed for sleep. 10/04/23   Gherghe, Costin M, MD    Allergies: Alpha-gal, Beef-derived drug products, Penicillins, Pork-derived products, Zestril  Dru.Donning ], Gadavist  [gadobutrol ], Ms contin  [morphine ], and Tetanus toxoid    Review of Systems  Updated Vital Signs BP 104/64   Pulse 72   Temp 98 F (36.7 C)   Resp 20   SpO2 98%   Physical Exam Constitutional:      General: He is not in acute distress. HENT:     Head: Normocephalic and atraumatic.  Eyes:     Conjunctiva/sclera: Conjunctivae normal.     Pupils: Pupils are equal, round, and reactive to light.  Cardiovascular:     Rate and Rhythm: Normal rate and regular rhythm.  Pulmonary:     Effort: Pulmonary effort is normal. No respiratory distress.  Abdominal:     General: There is no distension.     Tenderness: There is no abdominal tenderness.  Skin:    General: Skin is warm and dry.  Neurological:     General: No focal deficit present.     Mental Status: He is alert. Mental status is at baseline.  Psychiatric:        Mood and Affect: Mood normal.        Behavior: Behavior normal.     (all labs ordered are listed, but only abnormal results are displayed) Labs Reviewed  COMPREHENSIVE METABOLIC PANEL WITH GFR - Abnormal; Notable for the following components:      Result Value   Sodium 134 (*)    Chloride 94 (*)    Glucose, Bld 103 (*)    BUN 23 (*)    Calcium   7.6 (*)    Total Protein 4.4 (*)    Albumin  <1.5 (*)    AST 59 (*)    ALT 59 (*)    All other components within normal limits  CBC WITH DIFFERENTIAL/PLATELET - Abnormal; Notable for the following components:   WBC 11.6 (*)    Neutro Abs 9.3 (*)    All other components within normal limits  BRAIN NATRIURETIC PEPTIDE - Abnormal; Notable for the  following components:   B Natriuretic Peptide 229.5 (*)    All other components within normal limits  CULTURE, BLOOD (ROUTINE X 2)  C DIFFICILE QUICK SCREEN W PCR REFLEX    GASTROINTESTINAL PANEL BY PCR, STOOL (REPLACES STOOL CULTURE)  PROTIME-INR  MAGNESIUM   URINALYSIS, W/ REFLEX TO CULTURE (INFECTION SUSPECTED)    EKG: None  Radiology: DG Chest Port 1 View if patient is in a treatment room. Result Date: 01/14/2024 CLINICAL DATA:  Sepsis. Diarrhea and vomiting. Plasma cell disorder. EXAM: PORTABLE CHEST 1 VIEW COMPARISON:  12/18/2023 FINDINGS: Blunting of the left costophrenic angle compatible with pleural effusion. Retro diaphragmatic density on the right, cannot exclude small right pleural effusion. Prior median sternotomy.  Heart size within normal limits. The lungs appear otherwise clear. IMPRESSION: 1. Blunting of the left costophrenic angle compatible with pleural effusion. 2. Retro diaphragmatic density on the right, cannot exclude small right pleural effusion. 3. Prior median sternotomy. Electronically Signed   By: Ryan Salvage M.D.   On: 01/14/2024 16:29     Procedures   Medications Ordered in the ED  albumin  human 25 % solution 50 g (has no administration in time range)    Clinical Course as of 01/14/24 1750  Sun Jan 14, 2024  1750 Admitted to hospitalist [MT]    Clinical Course User Index [MT] Cottie Donnice PARAS, MD                                 Medical Decision Making Amount and/or Complexity of Data Reviewed Labs: ordered. Radiology: ordered.  Risk Prescription drug management. Decision regarding  hospitalization.   This patient presents to the ED with concern for watery diarrhea. This involves an extensive number of treatment options, and is a complaint that carries with it a high risk of complications and morbidity.  The differential diagnosis includes side effect of chemo medications vs opportunististic Gi infection, inc C. Diff, vs functional diarrhea vs other  Co-morbidities that complicate the patient evaluation: hx of hypotension, fluid shifts, at risk of dehydration; immunosuppressed  Additional history obtained from patient's wife  External records from outside source obtained and reviewed including recent hospital discharge summary  I ordered and personally interpreted labs.  The pertinent results include:  Ca 7.6, Alb < 1.5., WBC 11.6, K wnl.  Infectious stool studies ordered and pending on admission  The patient was maintained on a cardiac monitor.  I personally viewed and interpreted the cardiac monitored which showed an underlying rhythm of: NSR  I ordered medication including IV albumen for hypoalbuminemia (pt was scheduled for this infusion outpatient tomorrow with cancer center)  I have reviewed the patients home medicines and have made adjustments as needed  Test Considered: benign abd exam - no indication for CT imaging at this tim  After the interventions noted above, I reevaluated the patient and found that they have: stayed the same  Disposition:  After consideration of the diagnostic results and the patients response to treatment, I feel that the patent would benefit from medical admission.  Given concerns for labile blood pressures, fluid shifts, risk of dehydration and opportunistic GI infection, we agreed to medical admission at this time.  I do not see evidence of sepsis and would hold off on empiric antibiotics without a clear target for treatment.      Final diagnoses:  Diarrhea, unspecified type  Weakness  Hypoalbuminemia    ED Discharge  Orders  None          Cottie Donnice PARAS, MD 01/14/24 1710

## 2024-01-14 NOTE — ED Notes (Signed)
 Pt anxiety and dizziness reported. Albumin  infusion stopped

## 2024-01-14 NOTE — ED Triage Notes (Signed)
 Brought in by family.  Last cancer treatment Monday. Since then diarrhea and vomiting x 5 days.  Has tried immodium without lasting relief.  BP is maintained at home through medications.  Increasingly weak.  Pt had an episode of unresponsiveness and shaking with the family during the night.  Hypotensive at time of triage

## 2024-01-14 NOTE — Progress Notes (Signed)
   01/14/24 2240  BiPAP/CPAP/SIPAP  BiPAP/CPAP/SIPAP Pt Type Adult  BiPAP/CPAP/SIPAP  (pt home cpap)  Mask Type Nasal mask  EPAP  (pt home setting)  FiO2 (%) 21 %  Patient Home Machine Yes  Safety Check Completed by RT for Home Unit Yes, no issues noted  Patient Home Mask Yes  Patient Home Tubing Yes  Device Plugged into RED Power Outlet Yes  BiPAP/CPAP /SiPAP Vitals  Resp 13  MEWS Score/Color  MEWS Score 1  MEWS Score Color Green
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
## 2024-01-14 NOTE — ED Notes (Signed)
 Pt reports feeling off, like something bad is about to happen  EDP and admitting MD at bedside

## 2024-01-15 ENCOUNTER — Inpatient Hospital Stay (HOSPITAL_COMMUNITY)
Admission: RE | Admit: 2024-01-15 | Discharge: 2024-01-15 | Disposition: A | Discharge status: Home / Self Care | Source: Ambulatory Visit | Attending: Cardiology | Admitting: Cardiology

## 2024-01-15 ENCOUNTER — Inpatient Hospital Stay

## 2024-01-15 ENCOUNTER — Other Ambulatory Visit: Payer: Self-pay

## 2024-01-15 ENCOUNTER — Encounter (HOSPITAL_COMMUNITY): Payer: Self-pay

## 2024-01-15 ENCOUNTER — Encounter: Payer: Self-pay | Admitting: Hematology

## 2024-01-15 DIAGNOSIS — E876 Hypokalemia: Secondary | ICD-10-CM

## 2024-01-15 DIAGNOSIS — E8809 Other disorders of plasma-protein metabolism, not elsewhere classified: Secondary | ICD-10-CM

## 2024-01-15 LAB — URINALYSIS, W/ REFLEX TO CULTURE (INFECTION SUSPECTED)
Bacteria, UA: NONE SEEN
Bilirubin Urine: NEGATIVE
Glucose, UA: NEGATIVE mg/dL
Ketones, ur: NEGATIVE mg/dL
Leukocytes,Ua: NEGATIVE
Nitrite: NEGATIVE
Protein, ur: >=300 mg/dL — AB
RBC / HPF: >50 RBC/hpf (ref 0–5)
Specific Gravity, Urine: 1.026 (ref 1.005–1.030)
pH: 5 (ref 5.0–8.0)

## 2024-01-15 LAB — CBC
HCT: 42.2 % (ref 39.0–52.0)
Hemoglobin: 14.7 g/dL (ref 13.0–17.0)
MCH: 33.4 pg (ref 26.0–34.0)
MCHC: 34.8 g/dL (ref 30.0–36.0)
MCV: 95.9 fL (ref 80.0–100.0)
Platelets: 150 K/uL (ref 150–400)
RBC: 4.4 MIL/uL (ref 4.22–5.81)
RDW: 14.7 % (ref 11.5–15.5)
WBC: 10.2 K/uL (ref 4.0–10.5)
nRBC: 0 % (ref 0.0–0.2)

## 2024-01-15 LAB — COMPREHENSIVE METABOLIC PANEL WITH GFR
ALT: 48 U/L — ABNORMAL HIGH (ref 0–44)
AST: 49 U/L — ABNORMAL HIGH (ref 15–41)
Albumin: <1.5 g/dL — ABNORMAL LOW (ref 3.5–5.0)
Alkaline Phosphatase: 100 U/L (ref 38–126)
Anion gap: 9 (ref 5–15)
BUN: 27 mg/dL — ABNORMAL HIGH (ref 6–20)
CO2: 29 mmol/L (ref 22–32)
Calcium: 7.5 mg/dL — ABNORMAL LOW (ref 8.9–10.3)
Chloride: 96 mmol/L — ABNORMAL LOW (ref 98–111)
Creatinine, Ser: 0.77 mg/dL (ref 0.61–1.24)
GFR, Estimated: >60 mL/min (ref >60)
Glucose, Bld: 93 mg/dL (ref 70–99)
Potassium: 3.4 mmol/L — ABNORMAL LOW (ref 3.5–5.1)
Sodium: 134 mmol/L — ABNORMAL LOW (ref 135–145)
Total Bilirubin: 0.5 mg/dL (ref 0.0–1.2)
Total Protein: 3.9 g/dL — ABNORMAL LOW (ref 6.5–8.1)

## 2024-01-15 LAB — CG4 I-STAT (LACTIC ACID): Lactic Acid, Venous: 1.3 mmol/L (ref 0.5–1.9)

## 2024-01-15 MED ORDER — ACYCLOVIR 400 MG PO TABS
400.0000 mg | ORAL_TABLET | Freq: Two times a day (BID) | ORAL | Status: DC
  Administered 2024-01-15 – 2024-01-20 (×11): 400 mg via ORAL
  Filled 2024-01-15 (×12): qty 1

## 2024-01-15 MED ORDER — ADULT MULTIVITAMIN W/MINERALS CH
1.0000 | ORAL_TABLET | Freq: Every day | ORAL | Status: DC
  Administered 2024-01-16 – 2024-01-20 (×5): 1 via ORAL
  Filled 2024-01-15 (×5): qty 1

## 2024-01-15 MED ORDER — VITAMIN B-12 1000 MCG PO TABS
1000.0000 ug | ORAL_TABLET | Freq: Every day | ORAL | Status: DC
Start: 2024-01-15 — End: 2024-01-20
  Administered 2024-01-15 – 2024-01-20 (×6): 1000 ug via ORAL
  Filled 2024-01-15 (×6): qty 1

## 2024-01-15 MED ORDER — BUSPIRONE HCL 10 MG PO TABS
15.0000 mg | ORAL_TABLET | Freq: Two times a day (BID) | ORAL | Status: DC
  Administered 2024-01-15 – 2024-01-20 (×11): 15 mg via ORAL
  Filled 2024-01-15 (×11): qty 1

## 2024-01-15 MED ORDER — EPINEPHRINE 0.3 MG/0.3ML IJ SOAJ: 0.3000 mg | INTRAMUSCULAR | Status: DC | PRN

## 2024-01-15 MED ORDER — OXYCODONE HCL 5 MG PO TABS
20.0000 mg | ORAL_TABLET | ORAL | Status: DC
  Administered 2024-01-15 – 2024-01-20 (×30): 20 mg via ORAL
  Filled 2024-01-15 (×30): qty 4

## 2024-01-15 MED ORDER — TRAZODONE HCL 50 MG PO TABS
50.0000 mg | ORAL_TABLET | Freq: Every evening | ORAL | Status: DC | PRN
  Administered 2024-01-15: 50 mg via ORAL
  Filled 2024-01-15: qty 1

## 2024-01-15 MED ORDER — TRAZODONE HCL 100 MG PO TABS
100.0000 mg | ORAL_TABLET | Freq: Every evening | ORAL | Status: DC | PRN
  Administered 2024-01-16 – 2024-01-19 (×4): 100 mg via ORAL
  Filled 2024-01-15 (×5): qty 1

## 2024-01-15 MED ORDER — VITAMIN D 25 MCG (1000 UNIT) PO TABS
1000.0000 [IU] | ORAL_TABLET | Freq: Every morning | ORAL | Status: DC
  Administered 2024-01-16 – 2024-01-20 (×5): 1000 [IU] via ORAL
  Filled 2024-01-15 (×5): qty 1

## 2024-01-15 MED ORDER — ORAL CARE MOUTH RINSE
15.0000 mL | OROMUCOSAL | Status: DC | PRN
Start: 2024-01-15 — End: 2024-01-20

## 2024-01-15 MED ORDER — POTASSIUM CHLORIDE CRYS ER 20 MEQ PO TBCR
40.0000 meq | EXTENDED_RELEASE_TABLET | Freq: Once | ORAL | Status: AC
  Administered 2024-01-15: 40 meq via ORAL
  Filled 2024-01-15: qty 2

## 2024-01-15 MED ORDER — ALFUZOSIN HCL ER 10 MG PO TB24: 10.0000 mg | ORAL_TABLET | Freq: Every day | ORAL | Status: DC

## 2024-01-15 MED ORDER — MIDODRINE HCL 5 MG PO TABS: 15.0000 mg | ORAL_TABLET | Freq: Three times a day (TID) | ORAL | Status: DC

## 2024-01-15 MED ORDER — WITCH HAZEL-GLYCERIN EX PADS
MEDICATED_PAD | CUTANEOUS | Status: DC | PRN
  Filled 2024-01-15: qty 100

## 2024-01-15 MED ORDER — MAGNESIUM SULFATE 2 GM/50ML IV SOLN
2.0000 g | Freq: Once | INTRAVENOUS | Status: AC
  Administered 2024-01-15: 2 g via INTRAVENOUS
  Filled 2024-01-15: qty 50

## 2024-01-15 NOTE — Plan of Care (Signed)

## 2024-01-15 NOTE — Care Management Obs Status (Signed)
 MEDICARE OBSERVATION STATUS NOTIFICATION   Patient Details  Name: Joseph [name]. MRN: [id number] Date of Birth: August 22, 1965   Medicare Observation Status Notification Given:  Yes    [facility] Service, RN 01/15/2024, 4:59 PM
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
## 2024-01-16 ENCOUNTER — Other Ambulatory Visit: Payer: Self-pay

## 2024-01-16 DIAGNOSIS — E8809 Other disorders of plasma-protein metabolism, not elsewhere classified: Secondary | ICD-10-CM | POA: Diagnosis present

## 2024-01-16 DIAGNOSIS — G4733 Obstructive sleep apnea (adult) (pediatric): Secondary | ICD-10-CM | POA: Diagnosis present

## 2024-01-16 DIAGNOSIS — I480 Paroxysmal atrial fibrillation: Secondary | ICD-10-CM | POA: Diagnosis present

## 2024-01-16 DIAGNOSIS — Z79899 Other long term (current) drug therapy: Secondary | ICD-10-CM | POA: Diagnosis not present

## 2024-01-16 DIAGNOSIS — Z7982 Long term (current) use of aspirin: Secondary | ICD-10-CM | POA: Diagnosis not present

## 2024-01-16 DIAGNOSIS — F419 Anxiety disorder, unspecified: Secondary | ICD-10-CM | POA: Diagnosis present

## 2024-01-16 DIAGNOSIS — C9000 Multiple myeloma not having achieved remission: Secondary | ICD-10-CM | POA: Diagnosis present

## 2024-01-16 DIAGNOSIS — R918 Other nonspecific abnormal finding of lung field: Secondary | ICD-10-CM | POA: Diagnosis not present

## 2024-01-16 DIAGNOSIS — R140 Abdominal distension (gaseous): Secondary | ICD-10-CM | POA: Diagnosis not present

## 2024-01-16 DIAGNOSIS — E039 Hypothyroidism, unspecified: Secondary | ICD-10-CM | POA: Diagnosis present

## 2024-01-16 DIAGNOSIS — N400 Enlarged prostate without lower urinary tract symptoms: Secondary | ICD-10-CM | POA: Diagnosis present

## 2024-01-16 DIAGNOSIS — E785 Hyperlipidemia, unspecified: Secondary | ICD-10-CM | POA: Diagnosis present

## 2024-01-16 DIAGNOSIS — I5032 Chronic diastolic (congestive) heart failure: Secondary | ICD-10-CM | POA: Diagnosis present

## 2024-01-16 DIAGNOSIS — R197 Diarrhea, unspecified: Secondary | ICD-10-CM | POA: Diagnosis present

## 2024-01-16 DIAGNOSIS — R0989 Other specified symptoms and signs involving the circulatory and respiratory systems: Secondary | ICD-10-CM | POA: Diagnosis not present

## 2024-01-16 DIAGNOSIS — K521 Toxic gastroenteritis and colitis: Secondary | ICD-10-CM | POA: Diagnosis present

## 2024-01-16 DIAGNOSIS — Z7902 Long term (current) use of antithrombotics/antiplatelets: Secondary | ICD-10-CM | POA: Diagnosis not present

## 2024-01-16 DIAGNOSIS — D72829 Elevated white blood cell count, unspecified: Secondary | ICD-10-CM | POA: Diagnosis not present

## 2024-01-16 DIAGNOSIS — Z96642 Presence of left artificial hip joint: Secondary | ICD-10-CM | POA: Diagnosis not present

## 2024-01-16 DIAGNOSIS — E669 Obesity, unspecified: Secondary | ICD-10-CM | POA: Diagnosis present

## 2024-01-16 DIAGNOSIS — Z79890 Hormone replacement therapy (postmenopausal): Secondary | ICD-10-CM | POA: Diagnosis not present

## 2024-01-16 DIAGNOSIS — Z8249 Family history of ischemic heart disease and other diseases of the circulatory system: Secondary | ICD-10-CM | POA: Diagnosis not present

## 2024-01-16 DIAGNOSIS — N2889 Other specified disorders of kidney and ureter: Secondary | ICD-10-CM | POA: Diagnosis not present

## 2024-01-16 DIAGNOSIS — I422 Other hypertrophic cardiomyopathy: Secondary | ICD-10-CM | POA: Diagnosis not present

## 2024-01-16 DIAGNOSIS — F1120 Opioid dependence, uncomplicated: Secondary | ICD-10-CM | POA: Diagnosis present

## 2024-01-16 DIAGNOSIS — Z6837 Body mass index (BMI) 37.0-37.9, adult: Secondary | ICD-10-CM | POA: Diagnosis not present

## 2024-01-16 DIAGNOSIS — E860 Dehydration: Secondary | ICD-10-CM | POA: Diagnosis not present

## 2024-01-16 DIAGNOSIS — E876 Hypokalemia: Secondary | ICD-10-CM | POA: Diagnosis present

## 2024-01-16 DIAGNOSIS — E854 Organ-limited amyloidosis: Secondary | ICD-10-CM | POA: Diagnosis present

## 2024-01-16 DIAGNOSIS — I10 Essential (primary) hypertension: Secondary | ICD-10-CM | POA: Diagnosis not present

## 2024-01-16 DIAGNOSIS — I25119 Atherosclerotic heart disease of native coronary artery with unspecified angina pectoris: Secondary | ICD-10-CM | POA: Diagnosis not present

## 2024-01-16 DIAGNOSIS — I3139 Other pericardial effusion (noninflammatory): Secondary | ICD-10-CM | POA: Diagnosis present

## 2024-01-16 DIAGNOSIS — I43 Cardiomyopathy in diseases classified elsewhere: Secondary | ICD-10-CM | POA: Diagnosis present

## 2024-01-16 DIAGNOSIS — N200 Calculus of kidney: Secondary | ICD-10-CM | POA: Diagnosis not present

## 2024-01-16 DIAGNOSIS — I110 Hypertensive heart disease with heart failure: Secondary | ICD-10-CM | POA: Diagnosis present

## 2024-01-16 DIAGNOSIS — J9611 Chronic respiratory failure with hypoxia: Secondary | ICD-10-CM | POA: Diagnosis present

## 2024-01-16 DIAGNOSIS — R9431 Abnormal electrocardiogram [ECG] [EKG]: Secondary | ICD-10-CM | POA: Diagnosis not present

## 2024-01-16 DIAGNOSIS — I959 Hypotension, unspecified: Secondary | ICD-10-CM | POA: Diagnosis not present

## 2024-01-16 LAB — COMPREHENSIVE METABOLIC PANEL WITH GFR
ALT: 43 U/L (ref 0–44)
AST: 49 U/L — ABNORMAL HIGH (ref 15–41)
Albumin: <1.5 g/dL — ABNORMAL LOW (ref 3.5–5.0)
Alkaline Phosphatase: 89 U/L (ref 38–126)
Anion gap: 7 (ref 5–15)
BUN: 24 mg/dL — ABNORMAL HIGH (ref 6–20)
CO2: 28 mmol/L (ref 22–32)
Calcium: 7.2 mg/dL — ABNORMAL LOW (ref 8.9–10.3)
Chloride: 99 mmol/L (ref 98–111)
Creatinine, Ser: 0.75 mg/dL (ref 0.61–1.24)
GFR, Estimated: >60 mL/min (ref >60)
Glucose, Bld: 88 mg/dL (ref 70–99)
Potassium: 3.4 mmol/L — ABNORMAL LOW (ref 3.5–5.1)
Sodium: 134 mmol/L — ABNORMAL LOW (ref 135–145)
Total Bilirubin: 0.8 mg/dL (ref 0.0–1.2)
Total Protein: 3.7 g/dL — ABNORMAL LOW (ref 6.5–8.1)

## 2024-01-16 LAB — GASTROINTESTINAL PANEL BY PCR, STOOL (REPLACES STOOL CULTURE)

## 2024-01-16 LAB — MAGNESIUM: Magnesium: 1.9 mg/dL (ref 1.7–2.4)

## 2024-01-16 MED ORDER — POTASSIUM CHLORIDE IN NACL 40-0.9 MEQ/L-% IV SOLN
INTRAVENOUS | Status: AC
  Filled 2024-01-16 (×2): qty 1000

## 2024-01-16 MED ORDER — SODIUM CHLORIDE 0.9 % IV SOLN: INTRAVENOUS | Status: DC

## 2024-01-16 MED ORDER — LOPERAMIDE HCL 2 MG PO CAPS
2.0000 mg | ORAL_CAPSULE | Freq: Three times a day (TID) | ORAL | Status: DC | PRN
  Administered 2024-01-16: 2 mg via ORAL
  Filled 2024-01-16: qty 1

## 2024-01-16 MED ORDER — POTASSIUM CHLORIDE CRYS ER 20 MEQ PO TBCR
30.0000 meq | EXTENDED_RELEASE_TABLET | ORAL | Status: AC
  Administered 2024-01-16 (×2): 30 meq via ORAL
  Filled 2024-01-16 (×2): qty 1

## 2024-01-16 MED ORDER — MAGNESIUM SULFATE IN D5W 1-5 GM/100ML-% IV SOLN
1.0000 g | Freq: Once | INTRAVENOUS | Status: AC
  Administered 2024-01-16: 1 g via INTRAVENOUS
  Filled 2024-01-16: qty 100

## 2024-01-16 MED ORDER — LOPERAMIDE HCL 2 MG PO CAPS
2.0000 mg | ORAL_CAPSULE | Freq: Four times a day (QID) | ORAL | Status: DC | PRN
  Administered 2024-01-16: 2 mg via ORAL
  Filled 2024-01-16: qty 1

## 2024-01-16 NOTE — Plan of Care (Signed)
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
## 2024-01-16 NOTE — Progress Notes (Signed)
 Pt has home CPAP and places himself on without issue.    01/16/24 2201  BiPAP/CPAP/SIPAP  BiPAP/CPAP/SIPAP Pt Type Adult  BiPAP/CPAP/SIPAP Resmed (home machine)  Mask Type Nasal mask  EPAP  (pt on home settings)  FiO2 (%) 21 %  Patient Home Machine Yes  Safety Check Completed by RT for Home Unit Yes, no issues noted  Patient Home Mask Yes  Patient Home Tubing Yes  BiPAP/CPAP /SiPAP Vitals  Pulse Rate 73  Resp 14  SpO2 100 %  MEWS Score/Color  MEWS Score 0  MEWS Score Color Green

## 2024-01-17 DIAGNOSIS — F419 Anxiety disorder, unspecified: Secondary | ICD-10-CM

## 2024-01-17 DIAGNOSIS — E785 Hyperlipidemia, unspecified: Secondary | ICD-10-CM | POA: Diagnosis not present

## 2024-01-17 DIAGNOSIS — R197 Diarrhea, unspecified: Secondary | ICD-10-CM | POA: Diagnosis not present

## 2024-01-17 DIAGNOSIS — E039 Hypothyroidism, unspecified: Secondary | ICD-10-CM | POA: Diagnosis not present

## 2024-01-17 DIAGNOSIS — I25119 Atherosclerotic heart disease of native coronary artery with unspecified angina pectoris: Secondary | ICD-10-CM | POA: Diagnosis not present

## 2024-01-17 LAB — BASIC METABOLIC PANEL WITH GFR
Anion gap: 3 — ABNORMAL LOW (ref 5–15)
BUN: 18 mg/dL (ref 6–20)
CO2: 26 mmol/L (ref 22–32)
Calcium: 6.7 mg/dL — ABNORMAL LOW (ref 8.9–10.3)
Chloride: 106 mmol/L (ref 98–111)
Creatinine, Ser: 0.67 mg/dL (ref 0.61–1.24)
GFR, Estimated: >60 mL/min (ref >60)
Glucose, Bld: 100 mg/dL — ABNORMAL HIGH (ref 70–99)
Potassium: 4.3 mmol/L (ref 3.5–5.1)
Sodium: 135 mmol/L (ref 135–145)

## 2024-01-17 LAB — MAGNESIUM: Magnesium: 1.9 mg/dL (ref 1.7–2.4)

## 2024-01-17 MED ORDER — MIDODRINE HCL 5 MG PO TABS
5.0000 mg | ORAL_TABLET | Freq: Three times a day (TID) | ORAL | Status: DC
  Administered 2024-01-18: 5 mg via ORAL
  Filled 2024-01-17: qty 1

## 2024-01-17 MED ORDER — LOPERAMIDE HCL 2 MG PO CAPS
4.0000 mg | ORAL_CAPSULE | Freq: Once | ORAL | Status: AC
  Administered 2024-01-17: 4 mg via ORAL
  Filled 2024-01-17: qty 2

## 2024-01-17 MED ORDER — HYDROXYZINE HCL 25 MG PO TABS
25.0000 mg | ORAL_TABLET | Freq: Three times a day (TID) | ORAL | Status: DC | PRN
  Administered 2024-01-17 – 2024-01-19 (×3): 25 mg via ORAL
  Filled 2024-01-17 (×3): qty 1

## 2024-01-17 MED ORDER — LOPERAMIDE HCL 2 MG PO CAPS
2.0000 mg | ORAL_CAPSULE | Freq: Three times a day (TID) | ORAL | Status: DC
  Administered 2024-01-17 – 2024-01-18 (×5): 2 mg via ORAL
  Filled 2024-01-17 (×5): qty 1

## 2024-01-17 NOTE — Progress Notes (Signed)
   01/17/24 2313  BiPAP/CPAP/SIPAP  BiPAP/CPAP/SIPAP Pt Type Adult  BiPAP/CPAP/SIPAP  (home cpap)  Mask Type Nasal mask  FiO2 (%) 21 %  Patient Home Machine Yes  Safety Check Completed by RT for Home Unit Yes, no issues noted  Patient Home Mask Yes  Patient Home Tubing Yes  Device Plugged into [employer] Power Outlet Yes
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
## 2024-01-18 ENCOUNTER — Other Ambulatory Visit (HOSPITAL_COMMUNITY): Payer: Self-pay

## 2024-01-18 DIAGNOSIS — E785 Hyperlipidemia, unspecified: Secondary | ICD-10-CM | POA: Diagnosis not present

## 2024-01-18 DIAGNOSIS — E039 Hypothyroidism, unspecified: Secondary | ICD-10-CM | POA: Diagnosis not present

## 2024-01-18 DIAGNOSIS — R197 Diarrhea, unspecified: Secondary | ICD-10-CM | POA: Diagnosis not present

## 2024-01-18 DIAGNOSIS — I25119 Atherosclerotic heart disease of native coronary artery with unspecified angina pectoris: Secondary | ICD-10-CM | POA: Diagnosis not present

## 2024-01-18 LAB — CBC WITH DIFFERENTIAL/PLATELET
Abs Immature Granulocytes: 0.05 K/uL (ref 0.00–0.07)
Basophils Absolute: 0 K/uL (ref 0.0–0.1)
Basophils Relative: 0 %
Eosinophils Absolute: 0 K/uL (ref 0.0–0.5)
Eosinophils Relative: 0 %
HCT: 43.9 % (ref 39.0–52.0)
Hemoglobin: 15 g/dL (ref 13.0–17.0)
Immature Granulocytes: 0 %
Lymphocytes Relative: 9 %
Lymphs Abs: 1.1 K/uL (ref 0.7–4.0)
MCH: 33 pg (ref 26.0–34.0)
MCHC: 34.2 g/dL (ref 30.0–36.0)
MCV: 96.7 fL (ref 80.0–100.0)
Monocytes Absolute: 0.9 K/uL (ref 0.1–1.0)
Monocytes Relative: 7 %
Neutro Abs: 9.9 K/uL — ABNORMAL HIGH (ref 1.7–7.7)
Neutrophils Relative %: 84 %
Platelets: 116 K/uL — ABNORMAL LOW (ref 150–400)
RBC: 4.54 MIL/uL (ref 4.22–5.81)
RDW: 14.9 % (ref 11.5–15.5)
WBC: 12 K/uL — ABNORMAL HIGH (ref 4.0–10.5)
nRBC: 0 % (ref 0.0–0.2)

## 2024-01-18 LAB — RENAL FUNCTION PANEL
Albumin: <1.5 g/dL — ABNORMAL LOW (ref 3.5–5.0)
Anion gap: 6 (ref 5–15)
BUN: 21 mg/dL — ABNORMAL HIGH (ref 6–20)
CO2: 23 mmol/L (ref 22–32)
Calcium: 7.1 mg/dL — ABNORMAL LOW (ref 8.9–10.3)
Chloride: 104 mmol/L (ref 98–111)
Creatinine, Ser: 0.7 mg/dL (ref 0.61–1.24)
GFR, Estimated: >60 mL/min (ref >60)
Glucose, Bld: 107 mg/dL — ABNORMAL HIGH (ref 70–99)
Phosphorus: 2.9 mg/dL (ref 2.5–4.6)
Potassium: 4.3 mmol/L (ref 3.5–5.1)
Sodium: 133 mmol/L — ABNORMAL LOW (ref 135–145)

## 2024-01-18 LAB — MAGNESIUM: Magnesium: 1.8 mg/dL (ref 1.7–2.4)

## 2024-01-18 NOTE — Progress Notes (Signed)
   01/18/24 2001  BiPAP/CPAP/SIPAP  BiPAP/CPAP/SIPAP Pt Type Adult  BiPAP/CPAP/SIPAP Resmed (from home)  Mask Type Nasal mask (from home)  Dentures removed? Not applicable  FiO2 (%) 21 %  Heater Temperature  (sterile water provided)  Patient Home Machine Yes  Safety Check Completed by RT for Home Unit Yes, no issues noted  Patient Home Mask Yes  Patient Home Tubing Yes  Auto Titrate Yes  Minimum cmH2O 8 cmH2O  Maximum cmH2O 12 cmH2O  Device Plugged into RED Power Outlet Yes  BiPAP/CPAP /SiPAP Vitals  Pulse Rate 72  Resp 15  SpO2 94 %  Bilateral Breath Sounds Clear;Diminished  MEWS Score/Color  MEWS Score 0  MEWS Score Color Green

## 2024-01-18 NOTE — Plan of Care (Signed)
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
## 2024-01-19 ENCOUNTER — Inpatient Hospital Stay (HOSPITAL_COMMUNITY)

## 2024-01-19 ENCOUNTER — Ambulatory Visit: Admitting: Urology

## 2024-01-19 DIAGNOSIS — I25119 Atherosclerotic heart disease of native coronary artery with unspecified angina pectoris: Secondary | ICD-10-CM | POA: Diagnosis not present

## 2024-01-19 DIAGNOSIS — R197 Diarrhea, unspecified: Secondary | ICD-10-CM | POA: Diagnosis not present

## 2024-01-19 DIAGNOSIS — E785 Hyperlipidemia, unspecified: Secondary | ICD-10-CM | POA: Diagnosis not present

## 2024-01-19 DIAGNOSIS — E039 Hypothyroidism, unspecified: Secondary | ICD-10-CM | POA: Diagnosis not present

## 2024-01-19 LAB — CBC
HCT: 45 % (ref 39.0–52.0)
Hemoglobin: 14.9 g/dL (ref 13.0–17.0)
MCH: 32.2 pg (ref 26.0–34.0)
MCHC: 33.1 g/dL (ref 30.0–36.0)
MCV: 97.2 fL (ref 80.0–100.0)
Platelets: 125 K/uL — ABNORMAL LOW (ref 150–400)
RBC: 4.63 MIL/uL (ref 4.22–5.81)
RDW: 15 % (ref 11.5–15.5)
WBC: 19.7 K/uL — ABNORMAL HIGH (ref 4.0–10.5)
nRBC: 0 % (ref 0.0–0.2)

## 2024-01-19 LAB — CULTURE, BLOOD (ROUTINE X 2)
Culture: NO GROWTH
Special Requests: ADEQUATE

## 2024-01-19 LAB — CBC WITH DIFFERENTIAL/PLATELET
Abs Immature Granulocytes: 0.1 K/uL — ABNORMAL HIGH (ref 0.00–0.07)
Basophils Absolute: 0 K/uL (ref 0.0–0.1)
Basophils Relative: 0 %
Eosinophils Absolute: 0.1 K/uL (ref 0.0–0.5)
Eosinophils Relative: 0 %
HCT: 44.7 % (ref 39.0–52.0)
Hemoglobin: 15.2 g/dL (ref 13.0–17.0)
Immature Granulocytes: 1 %
Lymphocytes Relative: 8 %
Lymphs Abs: 1.7 K/uL (ref 0.7–4.0)
MCH: 33.1 pg (ref 26.0–34.0)
MCHC: 34 g/dL (ref 30.0–36.0)
MCV: 97.4 fL (ref 80.0–100.0)
Monocytes Absolute: 1.5 K/uL — ABNORMAL HIGH (ref 0.1–1.0)
Monocytes Relative: 7 %
Neutro Abs: 17.5 K/uL — ABNORMAL HIGH (ref 1.7–7.7)
Neutrophils Relative %: 84 %
Platelets: 146 K/uL — ABNORMAL LOW (ref 150–400)
RBC: 4.59 MIL/uL (ref 4.22–5.81)
RDW: 15.1 % (ref 11.5–15.5)
WBC: 20.9 K/uL — ABNORMAL HIGH (ref 4.0–10.5)
nRBC: 0 % (ref 0.0–0.2)

## 2024-01-19 LAB — URINALYSIS, COMPLETE (UACMP) WITH MICROSCOPIC
Bacteria, UA: NONE SEEN
Bilirubin Urine: NEGATIVE
Glucose, UA: NEGATIVE mg/dL
Ketones, ur: NEGATIVE mg/dL
Leukocytes,Ua: NEGATIVE
Nitrite: NEGATIVE
Protein, ur: 100 mg/dL — AB
Specific Gravity, Urine: 1.004 — ABNORMAL LOW (ref 1.005–1.030)
pH: 6 (ref 5.0–8.0)

## 2024-01-19 LAB — BASIC METABOLIC PANEL WITH GFR
Anion gap: 4 — ABNORMAL LOW (ref 5–15)
BUN: 19 mg/dL (ref 6–20)
CO2: 24 mmol/L (ref 22–32)
Calcium: 6.8 mg/dL — ABNORMAL LOW (ref 8.9–10.3)
Chloride: 103 mmol/L (ref 98–111)
Creatinine, Ser: 0.61 mg/dL (ref 0.61–1.24)
GFR, Estimated: >60 mL/min (ref >60)
Glucose, Bld: 98 mg/dL (ref 70–99)
Potassium: 4.3 mmol/L (ref 3.5–5.1)
Sodium: 131 mmol/L — ABNORMAL LOW (ref 135–145)

## 2024-01-19 LAB — MAGNESIUM: Magnesium: 1.9 mg/dL (ref 1.7–2.4)

## 2024-01-19 MED ORDER — ALBUMIN HUMAN 25 % IV SOLN
25.0000 g | Freq: Once | INTRAVENOUS | Status: AC
  Administered 2024-01-19: 12.5 g via INTRAVENOUS
  Filled 2024-01-19: qty 100

## 2024-01-19 MED ORDER — TORSEMIDE 20 MG PO TABS
20.0000 mg | ORAL_TABLET | Freq: Every day | ORAL | Status: DC
  Administered 2024-01-19 – 2024-01-20 (×2): 20 mg via ORAL
  Filled 2024-01-19 (×2): qty 1

## 2024-01-19 MED ORDER — LORAZEPAM 0.5 MG PO TABS
0.5000 mg | ORAL_TABLET | Freq: Three times a day (TID) | ORAL | Status: DC | PRN
  Administered 2024-01-19: 0.5 mg via ORAL
  Filled 2024-01-19: qty 1

## 2024-01-19 MED ORDER — MIDODRINE HCL 5 MG PO TABS
10.0000 mg | ORAL_TABLET | Freq: Three times a day (TID) | ORAL | Status: DC
  Administered 2024-01-19 – 2024-01-20 (×2): 10 mg via ORAL
  Filled 2024-01-19 (×2): qty 2

## 2024-01-19 MED ORDER — LORAZEPAM 0.5 MG PO TABS
0.5000 mg | ORAL_TABLET | Freq: Once | ORAL | Status: AC
  Administered 2024-01-19: 0.5 mg via ORAL
  Filled 2024-01-19: qty 1

## 2024-01-19 NOTE — Progress Notes (Signed)

## 2024-01-19 NOTE — Progress Notes (Signed)
   01/19/24 1916  BiPAP/CPAP/SIPAP  BiPAP/CPAP/SIPAP Pt Type Adult  BiPAP/CPAP/SIPAP Resmed  Mask Type Nasal mask  Dentures removed? Not applicable  FiO2 (%) 21 %  [place]ater Temperature  (sterile water provided)  Patient Home Machine Yes  Safety Check Completed by RT for Home Unit Yes, no issues noted  Patient Home Mask Yes  Patient Home Tubing Yes  Auto Titrate Yes  Minimum cmH2O 8 cmH2O  Maximum cmH2O 12 cmH2O  Device Plugged into RED Power Outlet Yes  BiPAP/CPAP /SiPAP Vitals  Pulse Rate 76  Resp 15  SpO2 98 %  Bilateral Breath Sounds Clear;Diminished  MEWS Score/Color  MEWS Score 0  MEWS Score Color [place]

## 2024-01-19 NOTE — TOC CM/SW Note (Signed)
 Transition of Care [facility]) - Inpatient Brief Assessment   Patient Details  Name: Joseph [name]. MRN: [id number] Date of Birth: 09-Sep-1965  Transition of Care [facility]) CM/SW Contact:    Tawni CHRISTELLA Eva, LCSW Phone Number: 01/19/2024, 10:48 AM   Transition of Care Asessment: Insurance and Status: Insurance coverage has been reviewed Patient has primary care physician: Yes Home environment has been reviewed: home with spouse Prior level of function:: independent Prior/Current Home Services: No current home services Social Drivers of Health Review: SDOH reviewed no interventions necessary Readmission risk has been reviewed: Yes Transition of care needs: no transition of care needs at this time
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
## 2024-01-19 NOTE — Plan of Care (Signed)

## 2024-01-20 DIAGNOSIS — I959 Hypotension, unspecified: Secondary | ICD-10-CM | POA: Diagnosis not present

## 2024-01-20 DIAGNOSIS — R197 Diarrhea, unspecified: Secondary | ICD-10-CM | POA: Diagnosis not present

## 2024-01-20 DIAGNOSIS — C9000 Multiple myeloma not having achieved remission: Secondary | ICD-10-CM | POA: Diagnosis not present

## 2024-01-20 DIAGNOSIS — E854 Organ-limited amyloidosis: Secondary | ICD-10-CM | POA: Diagnosis not present

## 2024-01-20 LAB — CBC WITH DIFFERENTIAL/PLATELET
Abs Immature Granulocytes: 0.08 K/uL — ABNORMAL HIGH (ref 0.00–0.07)
Basophils Absolute: 0.1 K/uL (ref 0.0–0.1)
Basophils Relative: 0 %
Eosinophils Absolute: 0.1 K/uL (ref 0.0–0.5)
Eosinophils Relative: 1 %
HCT: 37.9 % — ABNORMAL LOW (ref 39.0–52.0)
Hemoglobin: 12.9 g/dL — ABNORMAL LOW (ref 13.0–17.0)
Immature Granulocytes: 1 %
Lymphocytes Relative: 15 %
Lymphs Abs: 2.3 K/uL (ref 0.7–4.0)
MCH: 32.9 pg (ref 26.0–34.0)
MCHC: 34 g/dL (ref 30.0–36.0)
MCV: 96.7 fL (ref 80.0–100.0)
Monocytes Absolute: 1.1 K/uL — ABNORMAL HIGH (ref 0.1–1.0)
Monocytes Relative: 7 %
Neutro Abs: 12.1 K/uL — ABNORMAL HIGH (ref 1.7–7.7)
Neutrophils Relative %: 76 %
Platelets: 146 K/uL — ABNORMAL LOW (ref 150–400)
RBC: 3.92 MIL/uL — ABNORMAL LOW (ref 4.22–5.81)
RDW: 15.2 % (ref 11.5–15.5)
WBC: 15.7 K/uL — ABNORMAL HIGH (ref 4.0–10.5)
nRBC: 0 % (ref 0.0–0.2)

## 2024-01-20 LAB — BASIC METABOLIC PANEL WITH GFR
Anion gap: 4 — ABNORMAL LOW (ref 5–15)
BUN: 21 mg/dL — ABNORMAL HIGH (ref 6–20)
CO2: 30 mmol/L (ref 22–32)
Calcium: 6.9 mg/dL — ABNORMAL LOW (ref 8.9–10.3)
Chloride: 99 mmol/L (ref 98–111)
Creatinine, Ser: 0.59 mg/dL — ABNORMAL LOW (ref 0.61–1.24)
GFR, Estimated: >60 mL/min (ref >60)
Glucose, Bld: 99 mg/dL (ref 70–99)
Potassium: 4 mmol/L (ref 3.5–5.1)
Sodium: 133 mmol/L — ABNORMAL LOW (ref 135–145)

## 2024-01-20 LAB — URINE CULTURE: Culture: NO GROWTH

## 2024-01-20 LAB — MAGNESIUM: Magnesium: 1.9 mg/dL (ref 1.7–2.4)

## 2024-01-20 MED ORDER — MIDODRINE HCL 5 MG PO TABS: 15.0000 mg | ORAL_TABLET | Freq: Three times a day (TID) | ORAL | Status: DC

## 2024-01-20 MED ORDER — ACETAMINOPHEN 325 MG PO TABS: 650.0000 mg | ORAL_TABLET | Freq: Four times a day (QID) | ORAL | Status: AC | PRN

## 2024-01-20 NOTE — Plan of Care (Signed)

## 2024-01-20 NOTE — Plan of Care (Signed)
  Problem: Education: Goal: Knowledge of General Education information will improve Description: Including pain rating scale, medication(s)/side effects and non-pharmacologic comfort measures 01/20/2024 1507 by Terance Leonor BRAVO, RN Outcome: Adequate for Discharge 01/20/2024 0825 by Terance Leonor BRAVO, RN Outcome: Progressing   Problem: Health Behavior/Discharge Planning: Goal: Ability to manage health-related needs will improve 01/20/2024 1507 by Terance Leonor BRAVO, RN Outcome: Adequate for Discharge 01/20/2024 0825 by Terance Leonor BRAVO, RN Outcome: Progressing   Problem: Clinical Measurements: Goal: Ability to maintain clinical measurements within normal limits will improve 01/20/2024 1507 by Terance Leonor BRAVO, RN Outcome: Adequate for Discharge 01/20/2024 0825 by Terance Leonor BRAVO, RN Outcome: Progressing Goal: Will remain free from infection 01/20/2024 1507 by Terance Leonor BRAVO, RN Outcome: Adequate for Discharge 01/20/2024 0825 by Terance Leonor BRAVO, RN Outcome: Progressing Goal: Diagnostic test results will improve 01/20/2024 1507 by Terance Leonor BRAVO, RN Outcome: Adequate for Discharge 01/20/2024 0825 by Terance Leonor BRAVO, RN Outcome: Progressing Goal: Respiratory complications will improve 01/20/2024 1507 by Terance Leonor BRAVO, RN Outcome: Adequate for Discharge 01/20/2024 0825 by Terance Leonor BRAVO, RN Outcome: Progressing Goal: Cardiovascular complication will be avoided 01/20/2024 1507 by Terance Leonor BRAVO, RN Outcome: Adequate for Discharge 01/20/2024 0825 by Terance Leonor BRAVO, RN Outcome: Progressing   Problem: Activity: Goal: Risk for activity intolerance will decrease 01/20/2024 1507 by Terance Leonor BRAVO, RN Outcome: Adequate for Discharge 01/20/2024 0825 by Terance Leonor BRAVO, RN Outcome: Progressing   Problem: Nutrition: Goal: Adequate nutrition will be maintained 01/20/2024 1507 by Terance Leonor BRAVO, RN Outcome: Adequate for Discharge 01/20/2024 0825 by  Terance Leonor BRAVO, RN Outcome: Progressing   Problem: Coping: Goal: Level of anxiety will decrease 01/20/2024 1507 by Terance Leonor BRAVO, RN Outcome: Adequate for Discharge 01/20/2024 0825 by Terance Leonor BRAVO, RN Outcome: Progressing   Problem: Elimination: Goal: Will not experience complications related to bowel motility 01/20/2024 1507 by Terance Leonor BRAVO, RN Outcome: Adequate for Discharge 01/20/2024 0825 by Terance Leonor BRAVO, RN Outcome: Progressing Goal: Will not experience complications related to urinary retention 01/20/2024 1507 by Terance Leonor BRAVO, RN Outcome: Adequate for Discharge 01/20/2024 0825 by Terance Leonor BRAVO, RN Outcome: Progressing   Problem: Pain Managment: Goal: General experience of comfort will improve and/or be controlled 01/20/2024 1507 by Terance Leonor BRAVO, RN Outcome: Adequate for Discharge 01/20/2024 0825 by Terance Leonor BRAVO, RN Outcome: Progressing   Problem: Safety: Goal: Ability to remain free from injury will improve 01/20/2024 1507 by Terance Leonor BRAVO, RN Outcome: Adequate for Discharge 01/20/2024 0825 by Terance Leonor BRAVO, RN Outcome: Progressing   Problem: Skin Integrity: Goal: Risk for impaired skin integrity will decrease 01/20/2024 1507 by Terance Leonor BRAVO, RN Outcome: Adequate for Discharge 01/20/2024 0825 by Terance Leonor BRAVO, RN Outcome: Progressing

## 2024-01-20 NOTE — Progress Notes (Signed)
 Discharge instructions were printed and given/reviewed with the patient and his spouse.  Both the patient and his spouse demonstrated verbal understanding of the discharge instructions that were provided.   At this time of discharge, the patient is alert and oriented to person, place, time and situation. The patient is ambulatory with assistance, and has no complaints nor signs or symptoms of distress or complications. Vital signs are stable.   Patient to be taken via wheelchair to the main entrance where he will be further transported to his residence via his wife in private vehicle.   Leonor FORBES Clarks, BSN, RN 01/20/24 5:40 PM

## 2024-01-20 NOTE — Discharge Summary (Signed)
 Physician Discharge Summary  Joseph [name]. FMW:[id number] DOB: Sep 22, 1965 DOA: 01/14/2024  PCP: Joseph Jerel MATSU, MD  Admit date: 01/14/2024 Discharge date: 01/20/2024  Time spent: 60 minutes  Recommendations for Outpatient Follow-up:  Follow up WITH Joseph Jerel MATSU, MD in 1 to 2 weeks.  On follow-up patient will need a basic metabolic profile, magnesium  level done to follow-up on electrolytes and renal function. Follow-up with Dr. Onesimo, oncology as previously scheduled. Follow-up with Dr. Zenaida, cardiology in 1 week.   Discharge Diagnoses:  Principal Problem:   Intractable diarrhea Active Problems:   Hyperlipidemia   Coronary artery disease involving native coronary artery of native heart with angina pectoris (HCC)   Essential hypertension   OSA (obstructive sleep apnea)   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 [facility])   Prolonged QT interval   Cardiac amyloidosis (HCC)   Pericardial effusion   Hypothyroidism   Paroxysmal atrial fibrillation (HCC)   Hypoxic respiratory failure since cardiac surgery 04/2023 (HCC)   BPH (benign prostatic hyperplasia)   Hypotension   Multiple myeloma (HCC)   Heart failure (HCC)   Anxiety   Discharge Condition: Stable and improved.  Diet recommendation: Heart healthy  Filed Weights   01/14/24 2000 01/16/24 0533 01/17/24 1200  Weight: 123.6 kg 124.2 kg 125.6 kg    History of present illness:  HPI per Dr. Melvin  Joseph C Loeza Jr. is a 58 y.o. male with medical history significant of hypertension, hypertension, hyperlipidemia, CAD, CVA, paroxysmal atrial fibrillation, hypothyroidism, BPH, QT prolongation, pericardial effusion, chronic diastolic CHF, cardiac amyloidosis, hypertrophic cardiomyopathy, amyloidosis, multiple myeloma, OSA, chronic respiratory failure presenting with diarrhea.   Patient with recent admission for hypotension and volume overload where he was diuresed 7 L and discharge  weight was 285 pounds.  This is in the setting of significant hypotension/orthostatic hypotension in the setting of amyloidosis and multiple myeloma with low albumin  receiving weekly albumin  infusions.   Patient noted to have significant profuse watery diarrhea for the last week.  Has not been taking his diuretic this week due to continued weight loss despite not taking diuretic.  This started after his last chemotherapy session.  No blood in the stool.  Has had decreased p.o. intake but able to tolerate water.  Continues to have chronic hypotension which has been in the 70s to 80s outpatient and for which he takes midodrine  and droxidopa  after failing Florinef  due to fluid retention.   Denies fevers, chills, chest pain, shortness of breath, abdominal pain, nausea, vomiting.   ED Course: Vital signs in the ED notable for blood pressure initially in the 80s systolic now in the 100 systolic.  Received albumin  infusion in the ED.   Lab workup included CMP with sodium 134, chloride 94, BUN 23, creatinine stable, glucose 103, calcium  7.6, protein 11.4, albumin  less than 1.5, AST and ALT stably elevated at 54, 59.  CBC with mild leukocytosis to 11.6.  PT and INR normal.  BNP mildly elevated but near baseline at 229.  Blood cultures pending.  C. difficile and GI pathogen panel pending.  Magnesium  normal.   Chest x-ray showed possible small pleural effusion.  Status post tenotomy.  No other acute abnormalities noted.   Patient received albumin  in the ED.   Hospital Course:  #1 intractable diarrhea -It is noted that per patient and spouse patient has had some self-limited diarrhea which lasted a few hours postchemotherapy but this time around patient with ongoing unrelenting profuse diarrhea  that started the day after his last chemotherapy session which has progressively worsened. - C. difficile PCR done negative - GI pathogen panel negative - Blood cultures with no growth to date. - Patient improved  slowly during the hospitalization and placed on scheduled Imodium .   - Stool consistency improved, frequency improved and patient was subsequently discontinued from scheduled Imodium  which was changed to as needed.   - Patient be discharged in stable and improved condition on Imodium  as needed.   - Outpatient follow-up with PCP and primary oncologist.     2.  Hypokalemia -Secondary to GI losses. - Repleted during the hospitalization.   3.  Leukocytosis -Patient with a leukocytosis with white count of 19.7 the morning of 01/19/2024, from 12.0. - Patient has not received any G-CSF recently on review of chart with pharmacist this morning. - Patient not on steroids. - Patient denieD any respiratory symptoms, no urinary symptoms, no abdominal pain. - Chest x-ray and abdominal films done were unremarkable. - Urinalysis done was nitrite negative, leukocytes negative.  - Blood cultures obtained on admission negative to date. -Patient remained afebrile. -Leukocytosis trended down to 15.7 by day of discharge from 20.9. -Outpatient follow-up with PCP and primary oncologist. -Patient will need repeat CBC done early next week on follow-up with oncology. - Patient will be discharged in stable and improved condition.   4.  Chronic pain/opioid dependence - Patient maintained on home regimen of Oxy IR 20 mg 4 times daily.   5.  Chronic diastolic CHF/cardiac amyloid/hypertrophic cardiomyopathy/status post myomectomy -Patient with known history of chronic diastolic CHF last echo June 2025 showed EF of 50 to 55%, indeterminate diastolic function, normal RV function. - Due to patient's profuse diarrhea and weight loss at home patient self titrated down his torsemide  and eventually stopped taking it 3 days prior to admission. - Patient noted to be off torsemide  since admission. - ResumeD torsemide  at 20 mg daily.  - Status post IV albumin  during the hospitalization.   - Outpatient follow-up with primary  cardiologist in 1 week postdischarge.    6.  Multiple myeloma/amyloidosis -On chemotherapy, receiving weekly albumin  infusion due to associated hypotension and being followed by oncology. - Patient maintained on home regimen Acyclovir  - Patient's oncologist ( Dr. Onesimo)  notified of admission. -Outpatient follow-up with primary oncologist as previously scheduled.   7.  Hyperlipidemia - Patient maintained on home regimen Zetia , rosuvastatin .    8.  Chronic hypotension -In the setting of multiple myeloma and amyloidosis. - It is noted the patient does not tolerate Florinef  due to fluid retention. - Patient noted on droxidopa  and midodrine  prior to admission - Patient initially placed on home regimen droxidopa . -Midodrine  initially held and subsequently resumed  at 5 mg 3 times daily and further uptitrated to 15 mg 3 times daily, patient be discharged back on home regimen of midodrine  20 mg 3 times daily. - Per Dr.Hongalgi, patient noted to have some transient anxiety/panic feeling during albumin  infusion, allergic reaction felt less likely.  Resolved.   9.  History of CVA - Patient maintained on home regimen Plavix , Zetia , rosuvastatin , aspirin .    10.  CAD status post CABG - Patient status post CABG at time of myomectomy. - Patient maintained on home cardiac regimen of aspirin , Plavix , Zetia , rosuvastatin .   11.  Paroxysmal A-fib -Not on any rate limiting medications or rhythm controlling medications or anticoagulation. -Patient remained in normal sinus rhythm throughout the hospitalization. - Outpatient follow-up with cardiology.   12.  Hypothyroidism - Patient maintained on home regimen levothyroxine .    13.  QTc prolongation -Patient noted to have QTc prolonging at 529 on 7/28. - Repeat EKG with QTc of 492, 01/17/2024. - Electrolytes were repleted and potassium was kept approximately around 4 and magnesium  approximately 1 around 2.   - QTc prolonging medications were avoided.   - Outpatient follow-up with primary cardiologist.   14.  OSA - CPAP nightly and as needed.    15.  Anxiety - Patient was maintained on home regimen BuSpar  twice daily.   - Patient initially placed on hydroxyzine  as needed and subsequently placed on Ativan  0.5 mg 3 times daily as needed.   - Outpatient follow-up with PCP.   16.  Obesity -BMI of 36.96 kg/m. - Lifestyle modification. - Outpatient follow-up with PCP.    Procedures: Chest x-ray 01/14/2024, 01/19/2024 Abdominal films 01/19/2024  Consultations: None  Discharge Exam: Vitals:   01/20/24 0900 01/20/24 1433  BP: 109/76 100/71  Pulse: 69 84  Resp:  16  Temp:  98 F (36.7 C)  SpO2:  91%    General: NAD Cardiovascular: RRR no murmurs rubs or gallops.  No JVD.  2+ bilateral lower extremity edema. Respiratory: Clear to auscultation bilaterally.  No wheezes, no crackles, no rhonchi.  Fair air movement.  Speaking in full sentences.  Discharge Instructions   Discharge Instructions     Diet - low sodium heart healthy   Complete by: As directed    Increase activity slowly   Complete by: As directed       Allergies as of 01/20/2024       Reactions   Alpha-gal Anaphylaxis   Any mammalian meat or products   Beef-derived Drug Products Other (See Comments)   No beef  alpha -gal allergy   Penicillins Anaphylaxis   **CEFAZOLIN  received on 02/07/2014 and 04/08/2015 with no documented ADRs**   Pork-derived Products Other (See Comments)   No pork  alpha-gal allergy   Zestril  [lisinopril ] Anaphylaxis   Gadavist  [gadobutrol ] Nausea And Vomiting, Other (See Comments)   Pt vomits with Gadavist  contrast 09/14/2022 during cardiac MRI scan    Ms Contin  [morphine ] Anxiety, Other (See Comments)   Panic attacks   Tetanus Toxoid Other (See Comments)   Unknown reaction        Medication List     TAKE these medications    acetaminophen  325 MG tablet Commonly known as: TYLENOL  Take 2 tablets (650 mg total) by mouth every  6 (six) hours as needed for mild pain (pain score 1-3) or fever (or Fever >/= 101).   acyclovir  400 MG tablet Commonly known as: ZOVIRAX  Take 1 tablet (400 mg total) by mouth 2 (two) times daily.   alfuzosin  10 MG 24 hr tablet Commonly known as: UROXATRAL  Take 1 tablet (10 mg total) by mouth daily. What changed: when to take this   ascorbic acid  500 MG tablet Commonly known as: VITAMIN C  Take 500 mg by mouth daily.   aspirin  EC 81 MG tablet Take 1 tablet (81 mg total) by mouth daily. Swallow whole.   busPIRone  15 MG tablet Commonly known as: BUSPAR  Take 15 mg by mouth in the morning and at bedtime.   clopidogrel  75 MG tablet Commonly known as: PLAVIX  Take 75 mg by mouth daily.   cyanocobalamin  1000 MCG tablet Commonly known as: VITAMIN B12 Take 1,000 mcg by mouth daily.   droxidopa  100 MG Caps Commonly known as: NORTHERA  Take 1 capsule (100 mg total) by mouth  3 (three) times daily with meals.   EPINEPHrine  0.3 mg/0.3 mL Soaj injection Commonly known as: EpiPen  2-Pak Inject 0.3 mLs (0.3 mg total) into the muscle as needed for anaphylaxis.   ezetimibe  10 MG tablet Commonly known as: ZETIA  Take 1 tablet (10 mg total) by mouth daily. What changed: when to take this   Imodium  A-D 2 MG tablet Generic drug: loperamide  Take 2 mg by mouth 4 (four) times daily as needed for diarrhea or loose stools.   levothyroxine  200 MCG tablet Commonly known as: SYNTHROID  Take 1 tablet (200 mcg total) by mouth daily before breakfast.   midodrine  10 MG tablet Commonly known as: PROAMATINE  Take 2 tablets (20 mg total) by mouth 3 (three) times daily. What changed: how much to take   Multivitamin Men 50+ Tabs Take 1 tablet by mouth daily with breakfast.   nitroGLYCERIN  0.4 MG SL tablet Commonly known as: NITROSTAT  Place 0.4 mg under the tongue every 5 (five) minutes as needed for chest pain.   ondansetron  8 MG tablet Commonly known as: ZOFRAN  Take 8 mg by mouth every 8 (eight)  hours as needed for nausea or vomiting (only with oral chemo).   Oxycodone  HCl 20 MG Tabs Take 1 tablet (20 mg total) by mouth as directed. What changed:  when to take this additional instructions   polyethylene glycol 17 g packet Commonly known as: MIRALAX  / GLYCOLAX  Take 17 g by mouth daily as needed for moderate constipation.   prochlorperazine  10 MG tablet Commonly known as: COMPAZINE  Take 1 tablet (10 mg total) by mouth every 6 (six) hours as needed for nausea or vomiting.   rosuvastatin  40 MG tablet Commonly known as: CRESTOR  Take 1 tablet (40 mg total) by mouth daily. What changed: when to take this   senna 8.6 MG tablet Commonly known as: SENOKOT Take 1 tablet by mouth as needed for constipation.   SLOW-MAG PO Take 2 tablets by mouth in the morning.   torsemide  20 MG tablet Commonly known as: DEMADEX  Take 4 tablets (80 mg total) by mouth daily. Stop furosemide . What changed:  how much to take when to take this additional instructions   traZODone  50 MG tablet Commonly known as: DESYREL  Take 1 tablet (50 mg total) by mouth at bedtime as needed for sleep. What changed:  how much to take when to take this   Vitamin D3 1000 units Caps Take 1,000 Units by mouth in the morning.       Allergies  Allergen Reactions   Alpha-Gal Anaphylaxis    Any mammalian meat or products   Beef-Derived Drug Products Other (See Comments)    No beef  alpha -gal allergy   Penicillins Anaphylaxis    **CEFAZOLIN  received on 02/07/2014 and 04/08/2015 with no documented ADRs**   Pork-Derived Products Other (See Comments)    No pork  alpha-gal allergy   Zestril  [Lisinopril ] Anaphylaxis   Gadavist  [Gadobutrol ] Nausea And Vomiting and Other (See Comments)    Pt vomits with Gadavist  contrast 09/14/2022 during cardiac MRI scan    Ms Contin  [Morphine ] Anxiety and Other (See Comments)    Panic attacks   Tetanus Toxoid Other (See Comments)    Unknown reaction    Follow-up  Information     Joseph Jerel MATSU, MD. Schedule an appointment as soon as possible for a visit in 2 week(s).   Specialty: Family Medicine Why: Follow-up in 1 to 2 weeks. Contact information: [address] [place] [address] [phone]  Joseph [name] Emaline Brink, MD Follow up.   Specialties: Hematology, Oncology Why: Follow-up as scheduled. Contact information: [address] [place] [address] [phone]         Joseph [name] Morene PARAS, MD. Schedule an appointment as soon as possible for a visit in 1 week(s).   Specialty: Cardiology Contact information: [address] [place] [address] [phone]                  The results of significant diagnostics from this hospitalization (including imaging, microbiology, ancillary and laboratory) are listed below for reference.    Significant Diagnostic Studies: DG CHEST PORT 1 VIEW Result Date: 01/19/2024 CLINICAL DATA:  Leukocytosis EXAM: PORTABLE CHEST 1 VIEW COMPARISON:  Chest radiograph dated 01/14/2024 FINDINGS: Low lung volumes with bronchovascular crowding. Bibasilar linear opacities. No pleural effusion or pneumothorax. The heart size and mediastinal contours are within normal limits. Median sternotomy wires are nondisplaced. IMPRESSION: Low lung volumes with bronchovascular crowding. Bibasilar linear opacities, likely atelectasis. Electronically Signed   By: Limin  Xu M.D.   On: 01/19/2024 15:27   DG Abd Portable 1V Result Date: 01/19/2024 CLINICAL DATA:  100030 Leukocytosis 100030 EXAM: PORTABLE ABDOMEN - 1 VIEW COMPARISON:  12/19/2022. FINDINGS: The bowel gas pattern is non-obstructive. There is paucity of bowel gas. There are at least 3 calcifications overlying the left renal shadow with largest measuring up to 8 x 14 mm. There is a single calcification overlying the lower portion of right renal shadow measuring up to 5 mm. These are concerning for bilateral renal calculi. Correlate  clinically. No evidence of pneumoperitoneum. No acute osseous abnormalities. The soft tissues are within normal limits. Surgical changes, devices, tubes and lines: Left hip arthroplasty and left acetabular metallic hardware noted. IMPRESSION: Nonobstructive bowel gas pattern. Bilateral renal calculi. Electronically Signed   By: Ree Molt M.D.   On: 01/19/2024 12:14   DG Chest Port 1 View if patient is in a treatment room. Result Date: 01/14/2024 CLINICAL DATA:  Sepsis. Diarrhea and vomiting. Plasma cell disorder. EXAM: PORTABLE CHEST 1 VIEW COMPARISON:  12/18/2023 FINDINGS: Blunting of the left costophrenic angle compatible with pleural effusion. Retro diaphragmatic density on the right, cannot exclude small right pleural effusion. Prior median sternotomy.  Heart size within normal limits. The lungs appear otherwise clear. IMPRESSION: 1. Blunting of the left costophrenic angle compatible with pleural effusion. 2. Retro diaphragmatic density on the right, cannot exclude small right pleural effusion. 3. Prior median sternotomy. Electronically Signed   By: Ryan Salvage M.D.   On: 01/14/2024 16:29    Microbiology: Recent Results (from the past 240 hours)  Culture, blood (Routine x 2)     Status: None   Collection Time: 01/14/24  3:59 PM   Specimen: BLOOD  Result Value Ref Range Status   Specimen Description   Final    BLOOD LEFT ANTECUBITAL Performed at [facility], [address] [place], [place] [address]    Special Requests   Final    BOTTLES DRAWN AEROBIC AND ANAEROBIC Blood Culture adequate volume Performed at [facility], [address] [place], [place] [address]    Culture   Final    NO GROWTH 5 DAYS Performed at [facility], [address], [place], [place] [address]    Report Status 01/19/2024 FINAL  Final  C Difficile Quick Screen w PCR reflex     Status: None   Collection Time: 01/14/24  6:00 PM   Specimen:  STOOL  Result  Value Ref Range Status   C Diff antigen NEGATIVE NEGATIVE Final   C Diff toxin NEGATIVE NEGATIVE Final   C Diff interpretation No C. difficile detected.  Final    Comment: Performed at [facility], [address], [place], [place] [address]  Gastrointestinal Panel by PCR , Stool     Status: None   Collection Time: 01/14/24  6:25 PM  Result Value Ref Range Status   Campylobacter species NOT DETECTED NOT DETECTED Final   Plesimonas shigelloides NOT DETECTED NOT DETECTED Final   Salmonella species NOT DETECTED NOT DETECTED Final   Yersinia enterocolitica NOT DETECTED NOT DETECTED Final   Vibrio species NOT DETECTED NOT DETECTED Final   Vibrio cholerae NOT DETECTED NOT DETECTED Final   Enteroaggregative E coli (EAEC) NOT DETECTED NOT DETECTED Final   Enteropathogenic E coli (EPEC) NOT DETECTED NOT DETECTED Final   Enterotoxigenic E coli (ETEC) NOT DETECTED NOT DETECTED Final   Shiga like toxin producing E coli (STEC) NOT DETECTED NOT DETECTED Final   Shigella/Enteroinvasive E coli (EIEC) NOT DETECTED NOT DETECTED Final   Cryptosporidium NOT DETECTED NOT DETECTED Final   Cyclospora cayetanensis NOT DETECTED NOT DETECTED Final   Entamoeba histolytica NOT DETECTED NOT DETECTED Final   Giardia lamblia NOT DETECTED NOT DETECTED Final   Adenovirus F40/41 NOT DETECTED NOT DETECTED Final   Astrovirus NOT DETECTED NOT DETECTED Final   Norovirus GI/GII NOT DETECTED NOT DETECTED Final   Rotavirus A NOT DETECTED NOT DETECTED Final   Sapovirus (I, II, IV, and V) NOT DETECTED NOT DETECTED Final    Comment: Performed at [facility], [address] [place], [place] [address]  Urine Culture (for pregnant, neutropenic or urologic patients or patients with an indwelling urinary catheter)     Status: None   Collection Time: 01/19/24  1:00 PM   Specimen: Urine, Clean Catch  Result Value Ref Range Status   Specimen Description   Final    URINE, CLEAN CATCH Performed at [facility], [address] [place], [place] [address]    Special Requests   Final    NONE Performed at [facility], [address] [place], [place] [address]    Culture   Final    NO GROWTH Performed at [facility], [address] [place], [place] [address]    Report Status 01/20/2024 FINAL  Final     Labs: Basic Metabolic Panel: Recent Labs  Lab 01/16/24 0151 01/17/24 0400 01/18/24 1356 01/19/24 0425 01/20/24 0423  NA 134* 135 133* 131* 133*  K 3.4* 4.3 4.3 4.3 4.0  CL 99 106 104 103 99  CO2 28 26 23 24 30   GLUCOSE 88 100* 107* 98 99  BUN 24* 18 21* 19 21*  CREATININE 0.75 0.67 0.70 0.61 0.59*  CALCIUM  7.2* 6.7* 7.1* 6.8* 6.9*  MG 1.9 1.9 1.8 1.9 1.9  PHOS  --   --  2.9  --   --    Liver Function Tests: Recent Labs  Lab 01/14/24 1559 01/15/24 0508 01/16/24 0151 01/18/24 1356  AST 59* 49* 49*  --   ALT 59* 48* 43  --   ALKPHOS 120 100 89  --   BILITOT 0.9 0.5 0.8  --   PROT 4.4* 3.9* 3.7*  --   ALBUMIN  <1.5* <1.5* <1.5* <1.5*   No results for input(s): LIPASE, AMYLASE in the last 168 hours. No results for input(s): AMMONIA in  the last 168 hours. CBC: Recent Labs  Lab 01/14/24 1559 01/15/24 0508 01/18/24 1356 01/19/24 0425 01/19/24 1015 01/20/24 0423  WBC 11.6* 10.2 12.0* 19.7* 20.9* 15.7*  NEUTROABS 9.3*  --  9.9*  --  17.5* 12.1*  HGB 16.8 14.7 15.0 14.9 15.2 12.9*  HCT 49.1 42.2 43.9 45.0 44.7 37.9*  MCV 95.3 95.9 96.7 97.2 97.4 96.7  PLT 184 150 116* 125* 146* 146*   Cardiac Enzymes: No results for input(s): CKTOTAL, CKMB, CKMBINDEX, TROPONINI in the last 168 hours. BNP: BNP (last 3 results) Recent Labs    06/15/23 1235 10/01/23 1447 01/14/24 1559  BNP 316.7* 374.3* 229.5*    ProBNP (last 3 results) Recent Labs    08/19/23 0837  PROBNP 44    CBG: Recent Labs  Lab 01/14/24 1846  GLUCAP 114*       Signed:  Toribio Hummer MD.  [facility] 01/20/2024, 3:12  PM

## 2024-01-22 ENCOUNTER — Ambulatory Visit: Admitting: Hematology

## 2024-01-22 ENCOUNTER — Ambulatory Visit

## 2024-01-22 ENCOUNTER — Other Ambulatory Visit: Payer: Self-pay | Admitting: Hematology

## 2024-01-22 ENCOUNTER — Encounter: Payer: Self-pay | Admitting: Hematology

## 2024-01-22 ENCOUNTER — Other Ambulatory Visit (HOSPITAL_COMMUNITY): Payer: Self-pay

## 2024-01-22 ENCOUNTER — Other Ambulatory Visit: Payer: Self-pay

## 2024-01-22 ENCOUNTER — Inpatient Hospital Stay: Attending: Internal Medicine

## 2024-01-22 ENCOUNTER — Ambulatory Visit: Payer: Medicare Other | Admitting: Neurology

## 2024-01-22 ENCOUNTER — Other Ambulatory Visit

## 2024-01-22 ENCOUNTER — Encounter: Payer: Self-pay | Admitting: Neurology

## 2024-01-22 ENCOUNTER — Inpatient Hospital Stay

## 2024-01-22 ENCOUNTER — Telehealth (HOSPITAL_COMMUNITY): Payer: Self-pay | Admitting: Cardiology

## 2024-01-22 ENCOUNTER — Telehealth: Payer: Self-pay

## 2024-01-22 VITALS — Temp 97.7°F

## 2024-01-22 VITALS — BP 112/62 | HR 80 | Ht 72.0 in | Wt 282.4 lb

## 2024-01-22 DIAGNOSIS — R197 Diarrhea, unspecified: Secondary | ICD-10-CM | POA: Diagnosis not present

## 2024-01-22 DIAGNOSIS — R600 Localized edema: Secondary | ICD-10-CM | POA: Insufficient documentation

## 2024-01-22 DIAGNOSIS — R7989 Other specified abnormal findings of blood chemistry: Secondary | ICD-10-CM | POA: Insufficient documentation

## 2024-01-22 DIAGNOSIS — E8581 Light chain (AL) amyloidosis: Secondary | ICD-10-CM | POA: Insufficient documentation

## 2024-01-22 DIAGNOSIS — I43 Cardiomyopathy in diseases classified elsewhere: Secondary | ICD-10-CM

## 2024-01-22 DIAGNOSIS — C9000 Multiple myeloma not having achieved remission: Secondary | ICD-10-CM

## 2024-01-22 DIAGNOSIS — G3184 Mild cognitive impairment, so stated: Secondary | ICD-10-CM | POA: Diagnosis not present

## 2024-01-22 DIAGNOSIS — Z5111 Encounter for antineoplastic chemotherapy: Secondary | ICD-10-CM | POA: Insufficient documentation

## 2024-01-22 DIAGNOSIS — I959 Hypotension, unspecified: Secondary | ICD-10-CM

## 2024-01-22 DIAGNOSIS — Z5112 Encounter for antineoplastic immunotherapy: Secondary | ICD-10-CM | POA: Insufficient documentation

## 2024-01-22 DIAGNOSIS — Z8673 Personal history of transient ischemic attack (TIA), and cerebral infarction without residual deficits: Secondary | ICD-10-CM | POA: Diagnosis not present

## 2024-01-22 DIAGNOSIS — R413 Other amnesia: Secondary | ICD-10-CM

## 2024-01-22 DIAGNOSIS — E854 Organ-limited amyloidosis: Secondary | ICD-10-CM

## 2024-01-22 LAB — CMP (CANCER CENTER ONLY)
ALT: 71 U/L — ABNORMAL HIGH (ref 0–44)
AST: 72 U/L — ABNORMAL HIGH (ref 15–41)
Albumin: 1.6 g/dL — ABNORMAL LOW (ref 3.5–5.0)
Alkaline Phosphatase: 117 U/L (ref 38–126)
Anion gap: 4 — ABNORMAL LOW (ref 5–15)
BUN: 24 mg/dL — ABNORMAL HIGH (ref 6–20)
CO2: 28 mmol/L (ref 22–32)
Calcium: 6.9 mg/dL — ABNORMAL LOW (ref 8.9–10.3)
Chloride: 104 mmol/L (ref 98–111)
Creatinine: 0.81 mg/dL (ref 0.61–1.24)
GFR, Estimated: >60 mL/min (ref >60)
Glucose, Bld: 168 mg/dL — ABNORMAL HIGH (ref 70–99)
Potassium: 4 mmol/L (ref 3.5–5.1)
Sodium: 136 mmol/L (ref 135–145)
Total Bilirubin: 0.3 mg/dL (ref 0.0–1.2)
Total Protein: 3.7 g/dL — ABNORMAL LOW (ref 6.5–8.1)

## 2024-01-22 LAB — CBC WITH DIFFERENTIAL (CANCER CENTER ONLY)
Abs Immature Granulocytes: 0.02 K/uL (ref 0.00–0.07)
Basophils Absolute: 0.1 K/uL (ref 0.0–0.1)
Basophils Relative: 1 %
Eosinophils Absolute: 0.1 K/uL (ref 0.0–0.5)
Eosinophils Relative: 1 %
HCT: 42.9 % (ref 39.0–52.0)
Hemoglobin: 14.6 g/dL (ref 13.0–17.0)
Immature Granulocytes: 0 %
Lymphocytes Relative: 17 %
Lymphs Abs: 1.5 K/uL (ref 0.7–4.0)
MCH: 32.7 pg (ref 26.0–34.0)
MCHC: 34 g/dL (ref 30.0–36.0)
MCV: 96 fL (ref 80.0–100.0)
Monocytes Absolute: 0.7 K/uL (ref 0.1–1.0)
Monocytes Relative: 8 %
Neutro Abs: 6.2 K/uL (ref 1.7–7.7)
Neutrophils Relative %: 73 %
Platelet Count: 171 K/uL (ref 150–400)
RBC: 4.47 MIL/uL (ref 4.22–5.81)
RDW: 15 % (ref 11.5–15.5)
WBC Count: 8.5 K/uL (ref 4.0–10.5)
nRBC: 0 % (ref 0.0–0.2)

## 2024-01-22 MED ORDER — ACETAMINOPHEN 325 MG PO TABS
650.0000 mg | ORAL_TABLET | Freq: Once | ORAL | Status: AC
  Administered 2024-01-22: 650 mg via ORAL
  Filled 2024-01-22: qty 2

## 2024-01-22 MED ORDER — PALONOSETRON HCL INJECTION 0.25 MG/5ML
0.2500 mg | Freq: Once | INTRAVENOUS | Status: AC
  Administered 2024-01-22: 0.25 mg via INTRAVENOUS
  Filled 2024-01-22: qty 5

## 2024-01-22 MED ORDER — DEXAMETHASONE 4 MG PO TABS
12.0000 mg | ORAL_TABLET | Freq: Once | ORAL | Status: AC
Start: 2024-01-22 — End: 2024-01-22
  Administered 2024-01-22: 12 mg via ORAL
  Filled 2024-01-22: qty 3

## 2024-01-22 MED ORDER — SODIUM CHLORIDE 0.9 % IV SOLN: INTRAVENOUS | Status: DC

## 2024-01-22 MED ORDER — DEXAMETHASONE 4 MG PO TABS: 20.0000 mg | ORAL_TABLET | Freq: Once | ORAL | Status: DC

## 2024-01-22 MED ORDER — DIPHENHYDRAMINE HCL 25 MG PO CAPS
50.0000 mg | ORAL_CAPSULE | Freq: Once | ORAL | Status: AC
  Administered 2024-01-22: 50 mg via ORAL
  Filled 2024-01-22: qty 2

## 2024-01-22 MED ORDER — DARATUMUMAB-HYALURONIDASE-FIHJ 1800-30000 MG-UT/15ML ~~LOC~~ SOLN
1800.0000 mg | Freq: Once | SUBCUTANEOUS | Status: AC
  Administered 2024-01-22: 1800 mg via SUBCUTANEOUS
  Filled 2024-01-22: qty 15

## 2024-01-22 MED ORDER — SODIUM CHLORIDE 0.9 % IV SOLN
200.0000 mg/m2 | Freq: Once | INTRAVENOUS | Status: AC
  Administered 2024-01-22: 500 mg via INTRAVENOUS
  Filled 2024-01-22: qty 25

## 2024-01-22 MED ORDER — ALBUMIN HUMAN 25 % IV SOLN
25.0000 g | Freq: Once | INTRAVENOUS | Status: AC
  Administered 2024-01-22: 25 g via INTRAVENOUS
  Filled 2024-01-22: qty 100

## 2024-01-22 NOTE — Telephone Encounter (Signed)
 Called to confirm/remind patient of their appointment [facility] on 01/22/24 .   Appointment:   [x] Confirmed  [] Left mess   [] No answer/No voice mail  [] VM Full/unable to leave message  [] Phone not in service  Patient reminded to bring all medications and/or complete list.  Confirmed patient has transportation. Gave directions, instructed to utilize valet parking.

## 2024-01-22 NOTE — Patient Instructions (Signed)
 I had a long discussion with patient and his wife regarding his remote strokes following cardiac surgery mild cognitive impairment and answered questions.  I recommend he continue Plavix  alone for stroke prevention and discontinue that he can stop aspirin  after discussing this with his cardiologist Dr. Zenaida.  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  His mild memory difficulties are likely multifactorial due to combination of chemotherapy as well as minor strokes.  I recommend increase participation in cognitively challenging activities like solving crossword puzzles, playing bridge and sudoku.  We also discussed memory compensation strategies.  I advised him to use his wheeled walker at all times and we also discussed fall safety precautions.  Return for follow-up with Duwaine Eriksson nurse practitioner in 1 year or call earlier if necessary.  Memory Compensation Strategies  Use WARM strategy.  W= write it down  A= associate it  R= repeat it  M= make a mental note  2.   You can keep a Glass blower/designer.  Use a 3-ring notebook with sections for the following: calendar, important names and phone numbers,  medications, doctors' names/phone numbers, lists/reminders, and a section to journal what you did  each day.   3.    Use a calendar to write appointments down.  4.    Write yourself a schedule for the day.  This can be placed on the calendar or in a separate section of the Memory Notebook.  Keeping a  regular schedule can help memory.  5.    Use medication organizer with sections for each day or morning/evening pills.  You may need help loading it  6.    Keep a basket, or pegboard by the door.  Place items that you need to take out with you in the basket or on the pegboard.  You may also want to  include a message board for reminders.  7.    Use sticky notes.  Place sticky notes with reminders in a place where the task is performed.  For example:   turn off the  stove placed by the stove, lock the door placed on the door at eye level,  take your medications on  the bathroom mirror or by the place where you normally take your medications.  8.    Use alarms/timers.  Use while cooking to remind yourself to check on food or as a reminder to take your medicine, or as a  reminder to make a call, or as a reminder to perform another task, etc.  Fall Prevention in the Home, Adult Falls can cause injuries and affect people of all ages. There are many simple things that you can do to make your home safe and to help prevent falls. If you need it, ask for help making these changes. What actions can I take to prevent falls? General information Use good lighting in all rooms. Make sure to: Replace any light bulbs that burn out. Turn on lights if it is dark and use night-lights. Keep items that you use often in easy-to-reach places. Lower the shelves around your home if needed. Move furniture so that there are clear paths around it. Do not keep throw rugs or other things on the floor that can make you trip. If any of your floors are uneven, fix them. Add color or contrast paint or tape to clearly mark and help you see: Grab bars or handrails. First and last steps of staircases. Where the edge of each step is. If  you use a ladder or stepladder: Make sure that it is fully opened. Do not climb a closed ladder. Make sure the sides of the ladder are locked in place. Have someone hold the ladder while you use it. Know where your pets are as you move through your home. What can I do in the bathroom?     Keep the floor dry. Clean up any water that is on the floor right away. Remove soap buildup in the bathtub or shower. Buildup makes bathtubs and showers slippery. Use non-skid mats or decals on the floor of the bathtub or shower. Attach bath mats securely with double-sided, non-slip rug tape. If you need to sit down while you are in the shower,  use a non-slip stool. Install grab bars by the toilet and in the bathtub and shower. Do not use towel bars as grab bars. What can I do in the bedroom? Make sure that you have a light by your bed that is easy to reach. Do not use any sheets or blankets on your bed that hang to the floor. Have a firm bench or chair with side arms that you can use for support when you get dressed. What can I do in the kitchen? Clean up any spills right away. If you need to reach something above you, use a sturdy step stool that has a grab bar. Keep electrical cables out of the way. Do not use floor polish or wax that makes floors slippery. What can I do with my stairs? Do not leave anything on the stairs. Make sure that you have a light switch at the top and the bottom of the stairs. Have them installed if you do not have them. Make sure that there are handrails on both sides of the stairs. Fix handrails that are broken or loose. Make sure that handrails are as long as the staircases. Install non-slip stair treads on all stairs in your home if they do not have carpet. Avoid having throw rugs at the top or bottom of stairs, or secure the rugs with carpet tape to prevent them from moving. Choose a carpet design that does not hide the edge of steps on the stairs. Make sure that carpet is firmly attached to the stairs. Fix any carpet that is loose or worn. What can I do on the outside of my home? Use bright outdoor lighting. Repair the edges of walkways and driveways and fix any cracks. Clear paths of anything that can make you trip, such as tools or rocks. Add color or contrast paint or tape to clearly mark and help you see high doorway thresholds. Trim any bushes or trees on the main path into your home. Check that handrails are securely fastened and in good repair. Both sides of all steps should have handrails. Install guardrails along the edges of any raised decks or porches. Have leaves, snow, and ice cleared  regularly. Use sand, salt, or ice melt on walkways during winter months if you live where there is ice and snow. In the garage, clean up any spills right away, including grease or oil spills. What other actions can I take? Review your medicines with your health care provider. Some medicines can make you confused or feel dizzy. This can increase your chance of falling. Wear closed-toe shoes that fit well and support your feet. Wear shoes that have rubber soles and low heels. Use a cane, walker, scooter, or crutches that help you move around if needed. Talk with your  provider about other ways that you can decrease your risk of falls. This may include seeing a physical therapist to learn to do exercises to improve movement and strength. Where to find more information Centers for Disease Control and Prevention, STEADI: TonerPromos.no [employer] on Aging: BaseRingTones.pl National Institute on Aging: BaseRingTones.pl Contact a health care provider if: You are afraid of falling at home. You feel weak, drowsy, or dizzy at home. You fall at home. Get help right away if you: Lose consciousness or have trouble moving after a fall. Have a fall that causes a head injury. These symptoms may be an emergency. Get help right away. Call 911. Do not wait to see if the symptoms will go away. Do not drive yourself to the hospital. This information is not intended to replace advice given to you by your health care provider. Make sure you discuss any questions you have with your health care provider. Document Revised: 02/07/2022 Document Reviewed: 02/07/2022 Elsevier Patient Education  2024 [employer].

## 2024-01-22 NOTE — Progress Notes (Signed)
 Decrease dexamethasone  dose to 12mg  today per Dr. Onesimo.  Melvenia Favela, PharmD, MBA

## 2024-01-22 NOTE — Progress Notes (Signed)
 Specialty Pharmacy Refill Coordination Note  Joseph [name]. is a 58 y.o. male contacted today regarding refills of specialty medication(s) Droxidopa  (NORTHERA )   Patient requested Delivery   Delivery date: 01/24/24   Verified address: [address]  [place] [address]   Medication will be filled on 01/23/24.

## 2024-01-22 NOTE — Transitions of Care (Post Inpatient/ED Visit) (Signed)
   01/22/2024  Name: Joseph [name]. MRN: [id number] DOB: [birth year]  Today's TOC FU Call Status: Today's TOC FU Call Status:: Unsuccessful Call (1st Attempt) Unsuccessful Call (1st Attempt) Date: 01/22/24  Attempted to reach the patient regarding the most recent Inpatient/ED visit.  On Chart review it was noted that: Patient enrolled in [facility]  program: Pharmacy per [facility] icon.   Also noted: Last Discharge TOC outreach on 12/26/23, it was noted that spouse did request no follow up calls as she stated they have enough going on with all patient's treatment providers, oncology infusions, appointments, and did not need/want any further phone call outreaches than that.   Follow Up Plan: No further TOC outreach per spouse request on 12/26/2023. Did attempt outreach today with no answer before reading last request by spouse in chart review.    Bing Edison MSN, RN RN Case Sales executive Health  [facility] Office Hours M-F [phone] Direct Dial: [phone] Main Phone [phone]  Fax: [phone] [email or web address]
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
## 2024-01-23 ENCOUNTER — Other Ambulatory Visit: Payer: Self-pay

## 2024-01-23 LAB — KAPPA/LAMBDA LIGHT CHAINS
Kappa free light chain: 14.2 mg/L (ref 3.3–19.4)
Kappa, lambda light chain ratio: 0.45 (ref 0.26–1.65)
Lambda free light chains: 31.5 mg/L — ABNORMAL HIGH (ref 5.7–26.3)

## 2024-01-24 ENCOUNTER — Encounter (HOSPITAL_COMMUNITY): Admitting: Cardiology

## 2024-01-25 LAB — MULTIPLE MYELOMA PANEL, SERUM
Albumin SerPl Elph-Mcnc: 1.1 g/dL — ABNORMAL LOW (ref 2.9–4.4)
Albumin/Glob SerPl: 0.6 — ABNORMAL LOW (ref 0.7–1.7)
Alpha 1: 0.1 g/dL (ref 0.0–0.4)
Alpha2 Glob SerPl Elph-Mcnc: 1.2 g/dL — ABNORMAL HIGH (ref 0.4–1.0)
B-Globulin SerPl Elph-Mcnc: 0.6 g/dL — ABNORMAL LOW (ref 0.7–1.3)
Gamma Glob SerPl Elph-Mcnc: 0.2 g/dL — ABNORMAL LOW (ref 0.4–1.8)
Globulin, Total: 2.2 g/dL (ref 2.2–3.9)
IgA: 42 mg/dL — ABNORMAL LOW (ref 90–386)
IgG (Immunoglobin G), Serum: 231 mg/dL — ABNORMAL LOW (ref 603–1613)
IgM (Immunoglobulin M), Srm: 43 mg/dL (ref 20–172)
Total Protein ELP: 3.3 g/dL — ABNORMAL LOW (ref 6.0–8.5)

## 2024-01-26 ENCOUNTER — Other Ambulatory Visit: Payer: Self-pay

## 2024-01-26 ENCOUNTER — Encounter (HOSPITAL_COMMUNITY): Admitting: Cardiology

## 2024-01-26 ENCOUNTER — Telehealth (HOSPITAL_COMMUNITY): Payer: Self-pay

## 2024-01-26 DIAGNOSIS — E8581 Light chain (AL) amyloidosis: Secondary | ICD-10-CM

## 2024-01-26 DIAGNOSIS — C9000 Multiple myeloma not having achieved remission: Secondary | ICD-10-CM
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
## 2024-01-26 NOTE — Telephone Encounter (Signed)
 Called to confirm/remind patient of their appointment at the Advanced Heart Failure Clinic on 01/29/24.   Appointment:   [] Confirmed  [x] Left mess   [] No answer/No voice mail  [] VM Full/unable to leave message  [] Phone not in service  And to bring in all medications and/or complete list.

## 2024-01-26 NOTE — Progress Notes (Incomplete)
 ADVANCED HEART FAILURE FOLLOW UP CLINIC NOTE  Referring Physician: [name], MD  Primary Care: [name], MD Primary Cardiologist:  HPI: Joseph [name]. is a 58 y.o. male who presents for follow up of HFpEF and AL amyloid.      Patient has a fairly complex medical history.  Notable history of coronary artery disease with PCI to the LAD in 2015, moderate RCA stenosis at the time.  He established care with [facility] cardiology in 2023 for HCM and was trialed on mavacamten for worsening obstructive symptoms.  he reported that the medication interfere with his pain medication and so he was referred to [facility] clinic for septal myectomy.  Procedure was complicated by postoperative atrial fibrillation, likely TIA/CVA, but otherwise good result with improvement in gradient.  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  Mass spectroscopy was not able to identify the dominant amyloid protein.   Since that time he was admitted in December for CVA and was placed on aspirin /Plavix .  Additionally, he was admitted in March for symptomatic hypotension.  All blood pressure medications were held with improvement.  He was also found to have new nephrotic syndrome and follow-up was established with nephrology.   Since that time he had a bone marrow biopsy on 09/28/23 that showed small foci of amyloid deposits and 10% plasma cell burden.  Patient has been arranged to start on Dara-CyBorD, though planning to hold Velcade given neuropathy.  Admission for volume overload, given hypotension felt the need for inpatient observation with IV diuresis.      SUBJECTIVE:    PMH, current medications, allergies, social history, and family history reviewed in epic.  PHYSICAL EXAM: There were no vitals filed for this visit.  GENERAL: Pale, ill appearing PULM:  Normal work of breathing, clear to auscultation  bilaterally. Respirations are unlabored.  CARDIAC:  JVP: flat         Normal rate with regular rhythm. No murmurs, rubs or gallops.  2+ LE edema. Warm and well perfused extremities. ABDOMEN: Soft, non-tender, non-distended. NEUROLOGIC: Patient is oriented x3 with no focal or lateralizing neurologic deficits.    DATA REVIEW  ECG: 10/03/2022: NSR, LBBB    ECHO: 08/22/23: S/p septal reduction therapy, grade I DD, LVH   CATH: 07/2022: moderate proximal RCA stenosis, patent LAD and Lcx with mid ISR of mid LAD stent. 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  Suspect AL amyloid with obstructive physiology as the cause for his initial referral, kappa/lambda may have been missed on his initial endomyocardial biopsy.  Started chemotherapy.    Main symptom is debilitating orthostatic hypotension. He is currently taking midodrine  20mg  TID with an occasional extra dose at night. This helped initially but now barely keeps him upright. He failed florinef  due to volume retention. Small case series show benefit with droxidopa  in this patient population. Given that he has failed multiple therapies and supportive care including leg wraps will start today.  - Start droxidopa  100mg  TID - Continue midodrine  20mg  TID - Lasix  as needed for fluid retention - Follow up with oncology - Echo ordered and pending   CAD s/p single vessel CABG: Prior LAD PCI. - Continue plavix  alone  CVA: No evidence of atrial fibrillation.  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 - Follow up with nephrology  Follow up in 2 months  Joseph Brownie, MD Advanced Heart Failure Mechanical Circulatory  Support 01/26/24

## 2024-01-29 ENCOUNTER — Encounter (HOSPITAL_COMMUNITY): Payer: Self-pay

## 2024-01-29 ENCOUNTER — Ambulatory Visit (HOSPITAL_COMMUNITY)
Admission: RE | Admit: 2024-01-29 | Discharge: 2024-01-29 | Disposition: A | Discharge status: Home / Self Care | Source: Ambulatory Visit | Attending: Family Medicine | Admitting: Family Medicine

## 2024-01-29 ENCOUNTER — Inpatient Hospital Stay

## 2024-01-29 ENCOUNTER — Inpatient Hospital Stay (HOSPITAL_BASED_OUTPATIENT_CLINIC_OR_DEPARTMENT_OTHER)

## 2024-01-29 VITALS — BP 105/65 | HR 76 | Temp 97.5°F | Resp 16

## 2024-01-29 VITALS — BP 93/67 | HR 73 | Ht 72.0 in | Wt 285.4 lb

## 2024-01-29 DIAGNOSIS — Z79899 Other long term (current) drug therapy: Secondary | ICD-10-CM | POA: Diagnosis not present

## 2024-01-29 DIAGNOSIS — E8581 Light chain (AL) amyloidosis: Secondary | ICD-10-CM

## 2024-01-29 DIAGNOSIS — I5032 Chronic diastolic (congestive) heart failure: Secondary | ICD-10-CM | POA: Diagnosis not present

## 2024-01-29 DIAGNOSIS — E854 Organ-limited amyloidosis: Secondary | ICD-10-CM | POA: Diagnosis not present

## 2024-01-29 DIAGNOSIS — I951 Orthostatic hypotension: Secondary | ICD-10-CM | POA: Insufficient documentation

## 2024-01-29 DIAGNOSIS — Z5112 Encounter for antineoplastic immunotherapy: Secondary | ICD-10-CM | POA: Diagnosis not present

## 2024-01-29 DIAGNOSIS — N049 Nephrotic syndrome with unspecified morphologic changes: Secondary | ICD-10-CM | POA: Insufficient documentation

## 2024-01-29 DIAGNOSIS — C9000 Multiple myeloma not having achieved remission: Secondary | ICD-10-CM

## 2024-01-29 DIAGNOSIS — Z8673 Personal history of transient ischemic attack (TIA), and cerebral infarction without residual deficits: Secondary | ICD-10-CM | POA: Insufficient documentation

## 2024-01-29 DIAGNOSIS — I2510 Atherosclerotic heart disease of native coronary artery without angina pectoris: Secondary | ICD-10-CM

## 2024-01-29 DIAGNOSIS — R600 Localized edema: Secondary | ICD-10-CM | POA: Diagnosis not present

## 2024-01-29 DIAGNOSIS — I639 Cerebral infarction, unspecified: Secondary | ICD-10-CM | POA: Diagnosis not present

## 2024-01-29 DIAGNOSIS — I110 Hypertensive heart disease with heart failure: Secondary | ICD-10-CM | POA: Diagnosis not present

## 2024-01-29 DIAGNOSIS — Z955 Presence of coronary angioplasty implant and graft: Secondary | ICD-10-CM | POA: Diagnosis not present

## 2024-01-29 DIAGNOSIS — Z87891 Personal history of nicotine dependence: Secondary | ICD-10-CM | POA: Insufficient documentation

## 2024-01-29 DIAGNOSIS — R7989 Other specified abnormal findings of blood chemistry: Secondary | ICD-10-CM | POA: Diagnosis not present

## 2024-01-29 DIAGNOSIS — Z951 Presence of aortocoronary bypass graft: Secondary | ICD-10-CM | POA: Insufficient documentation

## 2024-01-29 DIAGNOSIS — E8809 Other disorders of plasma-protein metabolism, not elsewhere classified: Secondary | ICD-10-CM

## 2024-01-29 DIAGNOSIS — I959 Hypotension, unspecified: Secondary | ICD-10-CM

## 2024-01-29 DIAGNOSIS — I43 Cardiomyopathy in diseases classified elsewhere: Secondary | ICD-10-CM

## 2024-01-29 DIAGNOSIS — Z5111 Encounter for antineoplastic chemotherapy: Secondary | ICD-10-CM | POA: Diagnosis not present

## 2024-01-29 DIAGNOSIS — R197 Diarrhea, unspecified: Secondary | ICD-10-CM | POA: Diagnosis not present

## 2024-01-29 LAB — CMP (CANCER CENTER ONLY)
ALT: 218 U/L — ABNORMAL HIGH (ref 0–44)
AST: 180 U/L (ref 15–41)
Albumin: 1.6 g/dL — ABNORMAL LOW (ref 3.5–5.0)
Alkaline Phosphatase: 141 U/L — ABNORMAL HIGH (ref 38–126)
Anion gap: 3 — ABNORMAL LOW (ref 5–15)
BUN: 22 mg/dL — ABNORMAL HIGH (ref 6–20)
CO2: 34 mmol/L — ABNORMAL HIGH (ref 22–32)
Calcium: 6.8 mg/dL — ABNORMAL LOW (ref 8.9–10.3)
Chloride: 103 mmol/L (ref 98–111)
Creatinine: 0.75 mg/dL (ref 0.61–1.24)
GFR, Estimated: >60 mL/min (ref >60)
Glucose, Bld: 100 mg/dL — ABNORMAL HIGH (ref 70–99)
Potassium: 4.2 mmol/L (ref 3.5–5.1)
Sodium: 140 mmol/L (ref 135–145)
Total Bilirubin: 0.4 mg/dL (ref 0.0–1.2)
Total Protein: 3.4 g/dL — ABNORMAL LOW (ref 6.5–8.1)

## 2024-01-29 LAB — CBC WITH DIFFERENTIAL (CANCER CENTER ONLY)
Abs Immature Granulocytes: 0.03 K/uL (ref 0.00–0.07)
Basophils Absolute: 0.1 K/uL (ref 0.0–0.1)
Basophils Relative: 0 %
Eosinophils Absolute: 0.1 K/uL (ref 0.0–0.5)
Eosinophils Relative: 1 %
HCT: 45 % (ref 39.0–52.0)
Hemoglobin: 15.5 g/dL (ref 13.0–17.0)
Immature Granulocytes: 0 %
Lymphocytes Relative: 17 %
Lymphs Abs: 2 K/uL (ref 0.7–4.0)
MCH: 33.1 pg (ref 26.0–34.0)
MCHC: 34.4 g/dL (ref 30.0–36.0)
MCV: 96.2 fL (ref 80.0–100.0)
Monocytes Absolute: 0.8 K/uL (ref 0.1–1.0)
Monocytes Relative: 7 %
Neutro Abs: 9.1 K/uL — ABNORMAL HIGH (ref 1.7–7.7)
Neutrophils Relative %: 75 %
Platelet Count: 221 K/uL (ref 150–400)
RBC: 4.68 MIL/uL (ref 4.22–5.81)
RDW: 15 % (ref 11.5–15.5)
WBC Count: 12 K/uL — ABNORMAL HIGH (ref 4.0–10.5)
nRBC: 0 % (ref 0.0–0.2)

## 2024-01-29 MED ORDER — TORSEMIDE 20 MG PO TABS
ORAL_TABLET | ORAL | 3 refills | Status: DC
Start: 2024-01-29 — End: 2024-01-29

## 2024-01-29 MED ORDER — SODIUM CHLORIDE 0.9 % IV SOLN: INTRAVENOUS | Status: DC

## 2024-01-29 MED ORDER — MIDODRINE HCL 10 MG PO TABS: 20.0000 mg | ORAL_TABLET | Freq: Three times a day (TID) | ORAL | 11 refills | Status: AC

## 2024-01-29 MED ORDER — MIDODRINE HCL 10 MG PO TABS: 10.0000 mg | ORAL_TABLET | Freq: Four times a day (QID) | ORAL | Status: DC

## 2024-01-29 MED ORDER — TORSEMIDE 20 MG PO TABS: 20.0000 mg | ORAL_TABLET | Freq: Every day | ORAL | 3 refills | Status: AC

## 2024-01-29 MED ORDER — ALBUMIN HUMAN 25 % IV SOLN
25.0000 g | Freq: Once | INTRAVENOUS | Status: AC
  Administered 2024-01-29 (×2): 25 g via INTRAVENOUS
  Filled 2024-01-29: qty 100

## 2024-01-29 NOTE — Patient Instructions (Addendum)
 Continue midodrine  20 mg three times daily. Increase torsemide  to 20 mg daily - updated Rx sent. Return to see Dr. Zenaida in 2 weeks - see below. Please call us  at [phone] if any questions or concern prior to next visit.

## 2024-01-29 NOTE — Progress Notes (Signed)
 CRITICAL VALUE STICKER  CRITICAL VALUE:AST: 180  DATE & TIME NOTIFIED: 01/29/24 1501  MD NOTIFIED: Ones[place]  TIME OF NOTIFICATION:1515

## 2024-01-30 ENCOUNTER — Ambulatory Visit (HOSPITAL_COMMUNITY): Payer: Self-pay | Admitting: Family Medicine

## 2024-01-30 LAB — KAPPA/LAMBDA LIGHT CHAINS
Kappa free light chain: 11.8 mg/L (ref 3.3–19.4)
Kappa, lambda light chain ratio: 0.41 (ref 0.26–1.65)
Lambda free light chains: 28.9 mg/L — ABNORMAL HIGH (ref 5.7–26.3)

## 2024-02-01 DIAGNOSIS — N049 Nephrotic syndrome with unspecified morphologic changes: Secondary | ICD-10-CM | POA: Diagnosis not present

## 2024-02-01 DIAGNOSIS — I951 Orthostatic hypotension: Secondary | ICD-10-CM | POA: Diagnosis not present

## 2024-02-01 LAB — MULTIPLE MYELOMA PANEL, SERUM
Albumin SerPl Elph-Mcnc: 1.2 g/dL — ABNORMAL LOW (ref 2.9–4.4)
Albumin/Glob SerPl: 0.6 — ABNORMAL LOW (ref 0.7–1.7)
Alpha 1: 0.1 g/dL (ref 0.0–0.4)
Alpha2 Glob SerPl Elph-Mcnc: 1.2 g/dL — ABNORMAL HIGH (ref 0.4–1.0)
B-Globulin SerPl Elph-Mcnc: 0.6 g/dL — ABNORMAL LOW (ref 0.7–1.3)
Gamma Glob SerPl Elph-Mcnc: 0.2 g/dL — ABNORMAL LOW (ref 0.4–1.8)
Globulin, Total: 2.2 g/dL (ref 2.2–3.9)
IgA: 52 mg/dL — ABNORMAL LOW (ref 90–386)
IgG (Immunoglobin G), Serum: 238 mg/dL — ABNORMAL LOW (ref 603–1613)
IgM (Immunoglobulin M), Srm: 61 mg/dL (ref 20–172)
Total Protein ELP: 3.4 g/dL — ABNORMAL LOW (ref 6.0–8.5)

## 2024-02-05 ENCOUNTER — Ambulatory Visit

## 2024-02-05 ENCOUNTER — Ambulatory Visit: Admitting: Hematology

## 2024-02-05 ENCOUNTER — Encounter: Payer: Self-pay | Admitting: Hematology

## 2024-02-05 ENCOUNTER — Inpatient Hospital Stay (HOSPITAL_BASED_OUTPATIENT_CLINIC_OR_DEPARTMENT_OTHER): Admitting: Hematology

## 2024-02-05 ENCOUNTER — Inpatient Hospital Stay

## 2024-02-05 ENCOUNTER — Other Ambulatory Visit

## 2024-02-05 VITALS — BP 92/58 | HR 75 | Temp 97.3°F | Resp 20 | Wt 294.2 lb

## 2024-02-05 DIAGNOSIS — R7989 Other specified abnormal findings of blood chemistry: Secondary | ICD-10-CM | POA: Diagnosis not present

## 2024-02-05 DIAGNOSIS — E8581 Light chain (AL) amyloidosis: Secondary | ICD-10-CM

## 2024-02-05 DIAGNOSIS — C9000 Multiple myeloma not having achieved remission: Secondary | ICD-10-CM | POA: Diagnosis not present

## 2024-02-05 DIAGNOSIS — R600 Localized edema: Secondary | ICD-10-CM | POA: Diagnosis not present

## 2024-02-05 DIAGNOSIS — I959 Hypotension, unspecified: Secondary | ICD-10-CM

## 2024-02-05 DIAGNOSIS — R197 Diarrhea, unspecified: Secondary | ICD-10-CM | POA: Diagnosis not present

## 2024-02-05 DIAGNOSIS — Z5111 Encounter for antineoplastic chemotherapy: Secondary | ICD-10-CM | POA: Diagnosis not present

## 2024-02-05 DIAGNOSIS — Z5112 Encounter for antineoplastic immunotherapy: Secondary | ICD-10-CM | POA: Diagnosis not present

## 2024-02-05 LAB — CBC WITH DIFFERENTIAL (CANCER CENTER ONLY)
Abs Immature Granulocytes: 0.02 K/uL (ref 0.00–0.07)
Basophils Absolute: 0 K/uL (ref 0.0–0.1)
Basophils Relative: 1 %
Eosinophils Absolute: 0.1 K/uL (ref 0.0–0.5)
Eosinophils Relative: 1 %
HCT: 40.9 % (ref 39.0–52.0)
Hemoglobin: 14 g/dL (ref 13.0–17.0)
Immature Granulocytes: 0 %
Lymphocytes Relative: 21 %
Lymphs Abs: 1.7 K/uL (ref 0.7–4.0)
MCH: 32.9 pg (ref 26.0–34.0)
MCHC: 34.2 g/dL (ref 30.0–36.0)
MCV: 96.2 fL (ref 80.0–100.0)
Monocytes Absolute: 0.7 K/uL (ref 0.1–1.0)
Monocytes Relative: 9 %
Neutro Abs: 5.5 K/uL (ref 1.7–7.7)
Neutrophils Relative %: 68 %
Platelet Count: 218 K/uL (ref 150–400)
RBC: 4.25 MIL/uL (ref 4.22–5.81)
RDW: 14.9 % (ref 11.5–15.5)
WBC Count: 8 K/uL (ref 4.0–10.5)
nRBC: 0 % (ref 0.0–0.2)

## 2024-02-05 LAB — CMP (CANCER CENTER ONLY)
ALT: 113 U/L — ABNORMAL HIGH (ref 0–44)
AST: 127 U/L — ABNORMAL HIGH (ref 15–41)
Albumin: 1.7 g/dL — ABNORMAL LOW (ref 3.5–5.0)
Alkaline Phosphatase: 121 U/L (ref 38–126)
Anion gap: 3 — ABNORMAL LOW (ref 5–15)
BUN: 22 mg/dL — ABNORMAL HIGH (ref 6–20)
CO2: 35 mmol/L — ABNORMAL HIGH (ref 22–32)
Calcium: 7.3 mg/dL — ABNORMAL LOW (ref 8.9–10.3)
Chloride: 102 mmol/L (ref 98–111)
Creatinine: 0.64 mg/dL (ref 0.61–1.24)
GFR, Estimated: >60 mL/min (ref >60)
Glucose, Bld: 106 mg/dL — ABNORMAL HIGH (ref 70–99)
Potassium: 4 mmol/L (ref 3.5–5.1)
Sodium: 140 mmol/L (ref 135–145)
Total Bilirubin: 0.3 mg/dL (ref 0.0–1.2)
Total Protein: 3.9 g/dL — ABNORMAL LOW (ref 6.5–8.1)

## 2024-02-05 MED ORDER — DIPHENHYDRAMINE HCL 25 MG PO CAPS
50.0000 mg | ORAL_CAPSULE | Freq: Once | ORAL | Status: AC
  Administered 2024-02-05: 50 mg via ORAL
  Filled 2024-02-05: qty 2

## 2024-02-05 MED ORDER — DEXAMETHASONE 4 MG PO TABS
8.0000 mg | ORAL_TABLET | Freq: Once | ORAL | Status: AC
  Administered 2024-02-05: 8 mg via ORAL
  Filled 2024-02-05: qty 2

## 2024-02-05 MED ORDER — DARATUMUMAB-HYALURONIDASE-FIHJ 1800-30000 MG-UT/15ML ~~LOC~~ SOLN
1800.0000 mg | Freq: Once | SUBCUTANEOUS | Status: AC
  Administered 2024-02-05: 1800 mg via SUBCUTANEOUS
  Filled 2024-02-05: qty 15

## 2024-02-05 MED ORDER — ALBUMIN HUMAN 25 % IV SOLN
25.0000 g | Freq: Once | INTRAVENOUS | Status: AC
  Administered 2024-02-05: 25 g via INTRAVENOUS
  Filled 2024-02-05: qty 100

## 2024-02-05 MED ORDER — ACETAMINOPHEN 325 MG PO TABS
650.0000 mg | ORAL_TABLET | Freq: Once | ORAL | Status: AC
  Administered 2024-02-05: 650 mg via ORAL
  Filled 2024-02-05: qty 2

## 2024-02-05 NOTE — Patient Instructions (Signed)
 [facility] - A DEPT OF [facility]  Discharge Instructions: Thank you for choosing [facility] to provide your oncology and hematology care.   If you have a lab appointment with the Cancer Center, please go directly to the Cancer Center and check in at the registration area.   Wear comfortable clothing and clothing appropriate for easy access to any Portacath or PICC line.   We strive to give you quality time with your provider. You may need to reschedule your appointment if you arrive late (15 or more minutes).  Arriving late affects you and other patients whose appointments are after yours.  Also, if you miss three or more appointments without notifying the office, you may be dismissed from the clinic at the provider's discretion.      For prescription refill requests, have your pharmacy contact our office and allow 72 hours for refills to be completed.    Today you received the following chemotherapy and/or immunotherapy agents: daratumumab -hyaluronidase -fihj and albumin       To help prevent nausea and vomiting after your treatment, we encourage you to take your nausea medication as directed.  BELOW ARE SYMPTOMS THAT SHOULD BE REPORTED IMMEDIATELY: *FEVER GREATER THAN 100.4 F (38 C) OR HIGHER *CHILLS OR SWEATING *NAUSEA AND VOMITING THAT IS NOT CONTROLLED WITH YOUR NAUSEA MEDICATION *UNUSUAL SHORTNESS OF BREATH *UNUSUAL BRUISING OR BLEEDING *URINARY PROBLEMS (pain or burning when urinating, or frequent urination) *BOWEL PROBLEMS (unusual diarrhea, constipation, pain near the anus) TENDERNESS IN MOUTH AND THROAT WITH OR WITHOUT PRESENCE OF ULCERS (sore throat, sores in mouth, or a toothache) UNUSUAL RASH, SWELLING OR PAIN  UNUSUAL VAGINAL DISCHARGE OR ITCHING   Items with * indicate a potential emergency and should be followed up as soon as possible or go to the Emergency Department if any problems should occur.  Please show the  CHEMOTHERAPY ALERT CARD or IMMUNOTHERAPY ALERT CARD at check-in to the Emergency Department and triage nurse.  Should you have questions after your visit or need to cancel or reschedule your appointment, please contact [facility] - A DEPT OF [facility]  Dept: [phone]  and follow the prompts.  Office hours are 8:00 a.m. to 4:30 p.m. Monday - Friday. Please note that voicemails left after 4:00 p.m. may not be returned until the following business day.  We are closed weekends and major holidays. You have access to a nurse at all times for urgent questions. Please call the main number to the clinic Dept: [phone] and follow the prompts.   For any non-urgent questions, you may also contact your provider using MyChart. We now offer e-Visits for anyone 67 and older to request care online for non-urgent symptoms. For details visit [email or web address].   Also download the MyChart app! Go to the app store, search MyChart, open the app, select [place], and log in with your MyChart username and password.

## 2024-02-08 DIAGNOSIS — N049 Nephrotic syndrome with unspecified morphologic changes: Secondary | ICD-10-CM | POA: Diagnosis not present

## 2024-02-08 DIAGNOSIS — E8581 Light chain (AL) amyloidosis: Secondary | ICD-10-CM | POA: Diagnosis not present

## 2024-02-08 DIAGNOSIS — E854 Organ-limited amyloidosis: Secondary | ICD-10-CM | POA: Diagnosis not present

## 2024-02-08 DIAGNOSIS — I43 Cardiomyopathy in diseases classified elsewhere: Secondary | ICD-10-CM | POA: Diagnosis not present

## 2024-02-08 DIAGNOSIS — I951 Orthostatic hypotension: Secondary | ICD-10-CM | POA: Diagnosis not present

## 2024-02-09 ENCOUNTER — Other Ambulatory Visit: Payer: Self-pay

## 2024-02-09 DIAGNOSIS — E8581 Light chain (AL) amyloidosis: Secondary | ICD-10-CM

## 2024-02-11 ENCOUNTER — Encounter: Payer: Self-pay | Admitting: Hematology
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
## 2024-02-12 ENCOUNTER — Inpatient Hospital Stay

## 2024-02-12 ENCOUNTER — Encounter (HOSPITAL_COMMUNITY): Payer: Self-pay | Admitting: Cardiology

## 2024-02-12 ENCOUNTER — Ambulatory Visit (HOSPITAL_COMMUNITY)
Admission: RE | Admit: 2024-02-12 | Discharge: 2024-02-12 | Disposition: A | Discharge status: Home / Self Care | Source: Ambulatory Visit | Attending: Cardiology | Admitting: Cardiology

## 2024-02-12 ENCOUNTER — Encounter (HOSPITAL_COMMUNITY): Admitting: Cardiology

## 2024-02-12 VITALS — BP 101/65 | HR 73 | Resp 20

## 2024-02-12 VITALS — BP 98/62 | HR 74 | Ht 72.0 in | Wt 282.8 lb

## 2024-02-12 DIAGNOSIS — Z955 Presence of coronary angioplasty implant and graft: Secondary | ICD-10-CM | POA: Diagnosis not present

## 2024-02-12 DIAGNOSIS — R600 Localized edema: Secondary | ICD-10-CM | POA: Diagnosis not present

## 2024-02-12 DIAGNOSIS — Z5112 Encounter for antineoplastic immunotherapy: Secondary | ICD-10-CM | POA: Diagnosis not present

## 2024-02-12 DIAGNOSIS — I5030 Unspecified diastolic (congestive) heart failure: Secondary | ICD-10-CM | POA: Diagnosis not present

## 2024-02-12 DIAGNOSIS — E854 Organ-limited amyloidosis: Secondary | ICD-10-CM

## 2024-02-12 DIAGNOSIS — Z79899 Other long term (current) drug therapy: Secondary | ICD-10-CM | POA: Insufficient documentation

## 2024-02-12 DIAGNOSIS — R7989 Other specified abnormal findings of blood chemistry: Secondary | ICD-10-CM | POA: Diagnosis not present

## 2024-02-12 DIAGNOSIS — Z7902 Long term (current) use of antithrombotics/antiplatelets: Secondary | ICD-10-CM | POA: Insufficient documentation

## 2024-02-12 DIAGNOSIS — I959 Hypotension, unspecified: Secondary | ICD-10-CM

## 2024-02-12 DIAGNOSIS — I5032 Chronic diastolic (congestive) heart failure: Secondary | ICD-10-CM | POA: Diagnosis not present

## 2024-02-12 DIAGNOSIS — I43 Cardiomyopathy in diseases classified elsewhere: Secondary | ICD-10-CM | POA: Diagnosis not present

## 2024-02-12 DIAGNOSIS — Z8673 Personal history of transient ischemic attack (TIA), and cerebral infarction without residual deficits: Secondary | ICD-10-CM | POA: Insufficient documentation

## 2024-02-12 DIAGNOSIS — Z5111 Encounter for antineoplastic chemotherapy: Secondary | ICD-10-CM | POA: Diagnosis not present

## 2024-02-12 DIAGNOSIS — I2510 Atherosclerotic heart disease of native coronary artery without angina pectoris: Secondary | ICD-10-CM | POA: Diagnosis not present

## 2024-02-12 DIAGNOSIS — E8581 Light chain (AL) amyloidosis: Secondary | ICD-10-CM | POA: Diagnosis not present

## 2024-02-12 DIAGNOSIS — N049 Nephrotic syndrome with unspecified morphologic changes: Secondary | ICD-10-CM | POA: Diagnosis not present

## 2024-02-12 DIAGNOSIS — I951 Orthostatic hypotension: Secondary | ICD-10-CM | POA: Diagnosis not present

## 2024-02-12 DIAGNOSIS — R197 Diarrhea, unspecified: Secondary | ICD-10-CM | POA: Diagnosis not present

## 2024-02-12 LAB — CMP (CANCER CENTER ONLY)
ALT: 47 U/L — ABNORMAL HIGH (ref 0–44)
AST: 44 U/L — ABNORMAL HIGH (ref 15–41)
Albumin: 1.6 g/dL — ABNORMAL LOW (ref 3.5–5.0)
Alkaline Phosphatase: 101 U/L (ref 38–126)
Anion gap: 4 — ABNORMAL LOW (ref 5–15)
BUN: 19 mg/dL (ref 6–20)
CO2: 32 mmol/L (ref 22–32)
Calcium: 7.2 mg/dL — ABNORMAL LOW (ref 8.9–10.3)
Chloride: 107 mmol/L (ref 98–111)
Creatinine: 0.67 mg/dL (ref 0.61–1.24)
GFR, Estimated: >60 mL/min (ref >60)
Glucose, Bld: 111 mg/dL — ABNORMAL HIGH (ref 70–99)
Potassium: 3.6 mmol/L (ref 3.5–5.1)
Sodium: 143 mmol/L (ref 135–145)
Total Bilirubin: 0.3 mg/dL (ref 0.0–1.2)
Total Protein: 3.5 g/dL — ABNORMAL LOW (ref 6.5–8.1)

## 2024-02-12 LAB — CBC WITH DIFFERENTIAL (CANCER CENTER ONLY)
Abs Immature Granulocytes: 0.03 K/uL (ref 0.00–0.07)
Basophils Absolute: 0 K/uL (ref 0.0–0.1)
Basophils Relative: 0 %
Eosinophils Absolute: 0.1 K/uL (ref 0.0–0.5)
Eosinophils Relative: 1 %
HCT: 39.5 % (ref 39.0–52.0)
Hemoglobin: 13.2 g/dL (ref 13.0–17.0)
Immature Granulocytes: 0 %
Lymphocytes Relative: 30 %
Lymphs Abs: 2.7 K/uL (ref 0.7–4.0)
MCH: 32.8 pg (ref 26.0–34.0)
MCHC: 33.4 g/dL (ref 30.0–36.0)
MCV: 98.3 fL (ref 80.0–100.0)
Monocytes Absolute: 0.8 K/uL (ref 0.1–1.0)
Monocytes Relative: 9 %
Neutro Abs: 5.2 K/uL (ref 1.7–7.7)
Neutrophils Relative %: 60 %
Platelet Count: 207 K/uL (ref 150–400)
RBC: 4.02 MIL/uL — ABNORMAL LOW (ref 4.22–5.81)
RDW: 14.8 % (ref 11.5–15.5)
WBC Count: 8.8 K/uL (ref 4.0–10.5)
nRBC: 0 % (ref 0.0–0.2)

## 2024-02-12 MED ORDER — SODIUM CHLORIDE 0.9 % IV SOLN: Freq: Once | INTRAVENOUS | Status: AC

## 2024-02-12 MED ORDER — ALBUMIN HUMAN 25 % IV SOLN
25.0000 g | Freq: Once | INTRAVENOUS | Status: AC
  Administered 2024-02-12: 25 g via INTRAVENOUS
  Filled 2024-02-12: qty 100

## 2024-02-12 NOTE — Patient Instructions (Signed)
 STOP Asprin.  Your physician recommends that you schedule a follow-up appointment in: 2 months ( October)** PLEASE CALL THE OFFICE IN SEPTEMBER TO ARRANGE YOUR FOLLOW UP APPOINTMENT.**  If you have any questions or concerns before your next appointment please send us  a message through [place] or call our office at [phone].    TO LEAVE A MESSAGE FOR THE NURSE SELECT OPTION 2, PLEASE LEAVE A MESSAGE INCLUDING: YOUR NAME DATE OF BIRTH CALL BACK NUMBER REASON FOR CALL**this is important as we prioritize the call backs  YOU WILL RECEIVE A CALL BACK THE SAME DAY AS LONG AS YOU CALL BEFORE 4:00 PM  At the [facility], you and your health needs are our priority. As part of our continuing mission to provide you with exceptional heart care, we have created designated Provider Care Teams. These Care Teams include your primary Cardiologist (physician) and Advanced Practice Providers (APPs- Physician Assistants and Nurse Practitioners) who all work together to provide you with the care you need, when you need it.   You may see any of the following providers on your designated Care Team at your next follow up: Dr Toribio Fuel Dr Ezra Shuck Dr. Ria Commander Dr. [name] Amy Lenetta, NP Caffie Shed, [place] [facility] [place], [place] Beckey Coe, NP [place] Lee, NP Ellouise Class, NP Tinnie Redman, PharmD Jaun Bash, PharmD   Please be sure to bring in all your medications bottles to every appointment.    Thank you for choosing [facility]

## 2024-02-12 NOTE — Progress Notes (Signed)
 ADVANCED HEART FAILURE FOLLOW UP CLINIC NOTE  Referring Physician: [name], MD  Primary Care: [name], MD Primary Cardiologist:  HPI: Joseph [name]. is a 58 y.o. male who presents for follow up of HFpEF and AL amyloid.      Patient has a fairly complex medical history.  Notable history of coronary artery disease with PCI to the LAD in 2015, moderate RCA stenosis at the time.  He established care with [facility] cardiology in 2023 for HCM and was trialed on mavacamten for worsening obstructive symptoms.  he reported that the medication interfere with his pain medication and so he was referred to [facility] clinic for septal myectomy.  Procedure was complicated by postoperative atrial fibrillation, likely TIA/CVA, but otherwise good result with improvement in gradient.  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  Mass spectroscopy was not able to identify the dominant amyloid protein.   Since that time he was admitted in December for CVA and was placed on aspirin /Plavix .  Additionally, he was admitted in March for symptomatic hypotension.  All blood pressure medications were held with improvement.  He was also found to have new nephrotic syndrome and follow-up was established with nephrology.   Since that time he had a bone marrow biopsy on 09/28/23 that showed small foci of amyloid deposits and 10% plasma cell burden.  Patient was started on Dara-CyBorD, though planning to hold Velcade given neuropathy.  Admission for volume overload, given hypotension felt the need for inpatient observation with IV diuresis.        SUBJECTIVE:  Patient reports that he is doing fair, he still has significant issues with orthostatic hypotension but as long as his blood pressure is stable he feels fairly well.  Has seen an improvement since starting wrapping his legs.  They usually wrap his legs during the  day and leave the wraps off at night.  He takes 10 to 20 mg of torsemide  daily, depends on his blood pressure.  Will start increasing his albumin  for the next 4 weeks in the hope to augment his diuresis.  Chemotherapy backing off now that he is in hematologic remission.  PMH, current medications, allergies, social history, and family history reviewed in epic.  PHYSICAL EXAM: Vitals:   02/12/24 0927  BP: 98/62  Pulse: 74  SpO2: 91%    GENERAL: Pale, improved appearance from prior PULM: Normal work of breathing CARDIAC:  JVP: Flat         Normal rate with regular rhythm. No murmurs, rubs or gallops.  Leg wraps in place, 1+ lower extremity edema ABDOMEN: Soft, non-tender, non-distended. NEUROLOGIC: Patient is oriented x3 with no focal or lateralizing neurologic deficits.    DATA REVIEW  ECG: 10/03/2022: NSR, LBBB    ECHO: 08/22/23: S/p septal reduction therapy, grade I DD, LVH  11/29/2023: LVEF 50 to 55%, moderate LVH, RV size is normal  CATH: 07/2022: moderate proximal RCA stenosis, patent LAD and Lcx with mid ISR of mid LAD stent. 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   Main symptom is debilitating orthostatic hypotension as well as volume overload, largely due to poor oncotic pressure.  - Continue droxidopa  100mg  TID - Continue  midodrine  20mg  TID - Torsemide  20mg  daily, can hopefully increase with improving albumin , increased frequency of infusions - Continue follow up with oncology   CAD s/p single vessel CABG: Prior LAD PCI. - Continue plavix  alone   CVA: No evidence of atrial fibrillation.  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 - Follow up with  nephrology  Follow up in 2 months  [name], MD Advanced Heart Failure Mechanical Circulatory Support 02/12/24

## 2024-02-12 NOTE — Patient Instructions (Signed)
 Albumin  Injection What is this medication? ALBUMIN  (al BYOO min) treats low blood volume, which may occur with severe dehydration or bleeding. It works by increasing blood volume so your heart can pump blood to the rest of your body. It may also be used to treat low albumin  levels caused by surgery, infection, or other health conditions. Albumin  is a protein that helps your body balance the level of fluid in your blood vessels. This helps maintain a healthy blood pressure and prevents swelling or edema. This medicine may be used for other purposes; ask your health care provider or pharmacist if you have questions. COMMON BRAND NAME(S): Albuked , Albumarc, Albuminar, Albuminex , AlbuRx , Albutein , Buminate, Flexbumin , Kedbumin , Macrotec, Plasbumin , Plasbumin -20 What should I tell my care team before I take this medication? They need to know if you have any of these conditions: Heart disease Kidney disease Low red blood cell levels (anemia) An unusual or allergic reaction to albumin , other medications, foods, dyes, or preservatives Pregnant or trying to get pregnant Breastfeeding How should I use this medication? This medication is infused into a vein. It is given by your care team in a hospital or clinic setting. Talk to your care team about the use of this medication in children. While it may be given to children for selected conditions, precautions do apply. Overdosage: If you think you have taken too much of this medicine contact a poison control center or emergency room at once. NOTE: This medicine is only for you. Do not share this medicine with others. What if I miss a dose? This does not apply. What may interact with this medication? Interactions are not expected. This list may not describe all possible interactions. Give your health care provider a list of all the medicines, herbs, non-prescription drugs, or dietary supplements you use. Also tell them if you smoke, drink alcohol, or use  illegal drugs. Some items may interact with your medicine. What should I watch for while using this medication? Your condition will be monitored carefully while you are receiving this medication. This product is derived from human plasma. Talk to your care team about the risks and benefits of this medication. What side effects may I notice from receiving this medication? Side effects that you should report to your care team as soon as possible: Allergic reactions--skin rash, itching, hives, swelling of the face, lips, tongue, or throat Heart failure--shortness of breath, swelling of the ankles, feet, or hands, sudden weight gain, unusual weakness or fatigue Increase in blood pressure Shortness of breath or trouble breathing, cough, unusual weakness or fatigue, blue skin or lips Side effects that usually do not require medical attention (report these to your care team if they continue or are bothersome): Flushing Headache Nausea This list may not describe all possible side effects. Call your doctor for medical advice about side effects. You may report side effects to FDA at 1-800-FDA-1088. Where should I keep my medication? This medication is given in a hospital or clinic. It will not be stored at home. NOTE: This sheet is a summary. It may not cover all possible information. If you have questions about this medicine, talk to your doctor, pharmacist, or health care provider.  2024 Elsevier/Gold Standard (2023-05-19 00:00:00)

## 2024-02-14 ENCOUNTER — Ambulatory Visit: Admitting: Podiatry

## 2024-02-14 ENCOUNTER — Encounter: Payer: Self-pay | Admitting: Podiatry

## 2024-02-14 DIAGNOSIS — M79674 Pain in right toe(s): Secondary | ICD-10-CM | POA: Diagnosis not present

## 2024-02-14 DIAGNOSIS — B351 Tinea unguium: Secondary | ICD-10-CM | POA: Diagnosis not present

## 2024-02-14 DIAGNOSIS — M79675 Pain in left toe(s): Secondary | ICD-10-CM | POA: Diagnosis not present

## 2024-02-14 DIAGNOSIS — D689 Coagulation defect, unspecified: Secondary | ICD-10-CM

## 2024-02-14 NOTE — Progress Notes (Signed)
 This patient presents to the office with chief complaint of long thick painful nails.  Patient says the nails are painful walking and wearing shoes.  This patient is unable to self treat.  This patient is unable to trim her nails since he is unable to reach her nails.   Patient has history of TIA for which he takes plavix . he presents to the office for preventative foot care services.  General Appearance  Alert, conversant and in no acute stress.  Vascular  Dorsalis pedis and posterior tibial  pulses are  non palpable  due to swelling  bilaterally.  Capillary return is within normal limits  bilaterally. Temperature is within normal limits  bilaterally.  Neurologic  Senn-Weinstein monofilament wire test within normal limits  bilaterally. Muscle power within normal limits bilaterally.  Nails Thick disfigured discolored nails with subungual debris  from hallux to fifth toes bilaterally. No evidence of bacterial infection or drainage bilaterally.  Orthopedic  No limitations of motion  feet .  No crepitus or effusions noted.  No bony pathology or digital deformities noted.  Skin  normotropic skin with no porokeratosis noted bilaterally.  No signs of infections or ulcers noted.     Onychomycosis  Nails  B/L.  Pain in right toes  Pain in left toes  Debridement of nails both feet followed trimming the nails with dremel tool.    RTC 3 months.   Ruffin Cotton DPM

## 2024-02-15 ENCOUNTER — Other Ambulatory Visit: Payer: Self-pay

## 2024-02-15 ENCOUNTER — Ambulatory Visit

## 2024-02-15 ENCOUNTER — Inpatient Hospital Stay

## 2024-02-15 ENCOUNTER — Other Ambulatory Visit

## 2024-02-15 VITALS — BP 134/84 | HR 64 | Temp 98.1°F | Resp 18

## 2024-02-15 DIAGNOSIS — Z5111 Encounter for antineoplastic chemotherapy: Secondary | ICD-10-CM | POA: Diagnosis not present

## 2024-02-15 DIAGNOSIS — R197 Diarrhea, unspecified: Secondary | ICD-10-CM | POA: Diagnosis not present

## 2024-02-15 DIAGNOSIS — E8809 Other disorders of plasma-protein metabolism, not elsewhere classified: Secondary | ICD-10-CM

## 2024-02-15 DIAGNOSIS — Z5112 Encounter for antineoplastic immunotherapy: Secondary | ICD-10-CM | POA: Diagnosis not present

## 2024-02-15 DIAGNOSIS — I959 Hypotension, unspecified: Secondary | ICD-10-CM

## 2024-02-15 DIAGNOSIS — R7989 Other specified abnormal findings of blood chemistry: Secondary | ICD-10-CM | POA: Diagnosis not present

## 2024-02-15 DIAGNOSIS — R600 Localized edema: Secondary | ICD-10-CM | POA: Diagnosis not present

## 2024-02-15 DIAGNOSIS — E8581 Light chain (AL) amyloidosis: Secondary | ICD-10-CM | POA: Diagnosis not present

## 2024-02-15 MED ORDER — SODIUM CHLORIDE 0.9 % IV SOLN: INTRAVENOUS | Status: DC

## 2024-02-15 MED ORDER — ALBUMIN HUMAN 25 % IV SOLN
25.0000 g | Freq: Once | INTRAVENOUS | Status: AC
  Administered 2024-02-15: 25 g via INTRAVENOUS
  Filled 2024-02-15: qty 100

## 2024-02-15 NOTE — Patient Instructions (Signed)
 Albumin  Injection What is this medication? ALBUMIN  (al BYOO min) treats low blood volume, which may occur with severe dehydration or bleeding. It works by increasing blood volume so your heart can pump blood to the rest of your body. It may also be used to treat low albumin  levels caused by surgery, infection, or other health conditions. Albumin  is a protein that helps your body balance the level of fluid in your blood vessels. This helps maintain a healthy blood pressure and prevents swelling or edema. This medicine may be used for other purposes; ask your health care provider or pharmacist if you have questions. COMMON BRAND NAME(S): Albuked , Albumarc, Albuminar, Albuminex , AlbuRx , Albutein , Buminate, Flexbumin , Kedbumin , Macrotec, Plasbumin , Plasbumin -20 What should I tell my care team before I take this medication? They need to know if you have any of these conditions: Heart disease Kidney disease Low red blood cell levels (anemia) An unusual or allergic reaction to albumin , other medications, foods, dyes, or preservatives Pregnant or trying to get pregnant Breastfeeding How should I use this medication? This medication is infused into a vein. It is given by your care team in a hospital or clinic setting. Talk to your care team about the use of this medication in children. While it may be given to children for selected conditions, precautions do apply. Overdosage: If you think you have taken too much of this medicine contact a poison control center or emergency room at once. NOTE: This medicine is only for you. Do not share this medicine with others. What if I miss a dose? This does not apply. What may interact with this medication? Interactions are not expected. This list may not describe all possible interactions. Give your health care provider a list of all the medicines, herbs, non-prescription drugs, or dietary supplements you use. Also tell them if you smoke, drink alcohol, or use  illegal drugs. Some items may interact with your medicine. What should I watch for while using this medication? Your condition will be monitored carefully while you are receiving this medication. This product is derived from human plasma. Talk to your care team about the risks and benefits of this medication. What side effects may I notice from receiving this medication? Side effects that you should report to your care team as soon as possible: Allergic reactions--skin rash, itching, hives, swelling of the face, lips, tongue, or throat Heart failure--shortness of breath, swelling of the ankles, feet, or hands, sudden weight gain, unusual weakness or fatigue Increase in blood pressure Shortness of breath or trouble breathing, cough, unusual weakness or fatigue, blue skin or lips Side effects that usually do not require medical attention (report these to your care team if they continue or are bothersome): Flushing Headache Nausea This list may not describe all possible side effects. Call your doctor for medical advice about side effects. You may report side effects to FDA at 1-800-FDA-1088. Where should I keep my medication? This medication is given in a hospital or clinic. It will not be stored at home. NOTE: This sheet is a summary. It may not cover all possible information. If you have questions about this medicine, talk to your doctor, pharmacist, or health care provider.  2024 Elsevier/Gold Standard (2023-05-19 00:00:00)

## 2024-02-19 DIAGNOSIS — E7849 Other hyperlipidemia: Secondary | ICD-10-CM | POA: Diagnosis not present

## 2024-02-20 ENCOUNTER — Inpatient Hospital Stay

## 2024-02-20 ENCOUNTER — Telehealth (HOSPITAL_COMMUNITY): Payer: Self-pay | Admitting: Cardiology

## 2024-02-20 ENCOUNTER — Ambulatory Visit

## 2024-02-20 ENCOUNTER — Other Ambulatory Visit (HOSPITAL_COMMUNITY): Payer: Self-pay

## 2024-02-20 ENCOUNTER — Other Ambulatory Visit

## 2024-02-20 NOTE — Telephone Encounter (Signed)
 Pt aware and OTC med sent via mychart

## 2024-02-20 NOTE — Telephone Encounter (Signed)
 Patients wife called to report pt tested positive for COVID  Pts wife would like to know what can be taken OTC for cough and congestion that is safe with cardiology condition  Would also like to know if its safe to use paxlovid   Please advise

## 2024-02-21 ENCOUNTER — Other Ambulatory Visit: Payer: Self-pay | Admitting: Hematology

## 2024-02-21 ENCOUNTER — Inpatient Hospital Stay

## 2024-02-21 ENCOUNTER — Inpatient Hospital Stay: Attending: Internal Medicine

## 2024-02-21 ENCOUNTER — Encounter: Payer: Self-pay | Admitting: Hematology

## 2024-02-21 VITALS — BP 103/43 | HR 79 | Temp 98.3°F

## 2024-02-21 DIAGNOSIS — Z5112 Encounter for antineoplastic immunotherapy: Secondary | ICD-10-CM | POA: Insufficient documentation

## 2024-02-21 DIAGNOSIS — E8581 Light chain (AL) amyloidosis: Secondary | ICD-10-CM | POA: Diagnosis not present

## 2024-02-21 DIAGNOSIS — I959 Hypotension, unspecified: Secondary | ICD-10-CM

## 2024-02-21 DIAGNOSIS — C9000 Multiple myeloma not having achieved remission: Secondary | ICD-10-CM

## 2024-02-21 LAB — CMP (CANCER CENTER ONLY)
ALT: 41 U/L (ref 0–44)
AST: 45 U/L — ABNORMAL HIGH (ref 15–41)
Albumin: 1.6 g/dL — ABNORMAL LOW (ref 3.5–5.0)
Alkaline Phosphatase: 101 U/L (ref 38–126)
Anion gap: 0 — ABNORMAL LOW (ref 5–15)
BUN: 23 mg/dL — ABNORMAL HIGH (ref 6–20)
CO2: 38 mmol/L — ABNORMAL HIGH (ref 22–32)
Calcium: 7.1 mg/dL — ABNORMAL LOW (ref 8.9–10.3)
Chloride: 103 mmol/L (ref 98–111)
Creatinine: 0.59 mg/dL — ABNORMAL LOW (ref 0.61–1.24)
GFR, Estimated: >60 mL/min (ref >60)
Glucose, Bld: 108 mg/dL — ABNORMAL HIGH (ref 70–99)
Potassium: 4.4 mmol/L (ref 3.5–5.1)
Sodium: 141 mmol/L (ref 135–145)
Total Bilirubin: 0.2 mg/dL (ref 0.0–1.2)
Total Protein: 3.6 g/dL — ABNORMAL LOW (ref 6.5–8.1)

## 2024-02-21 LAB — CBC WITH DIFFERENTIAL (CANCER CENTER ONLY)
Abs Immature Granulocytes: 0.02 K/uL (ref 0.00–0.07)
Basophils Absolute: 0 K/uL (ref 0.0–0.1)
Basophils Relative: 0 %
Eosinophils Absolute: 0.1 K/uL (ref 0.0–0.5)
Eosinophils Relative: 1 %
HCT: 36.1 % — ABNORMAL LOW (ref 39.0–52.0)
Hemoglobin: 12.2 g/dL — ABNORMAL LOW (ref 13.0–17.0)
Immature Granulocytes: 0 %
Lymphocytes Relative: 28 %
Lymphs Abs: 2.3 K/uL (ref 0.7–4.0)
MCH: 33 pg (ref 26.0–34.0)
MCHC: 33.8 g/dL (ref 30.0–36.0)
MCV: 97.6 fL (ref 80.0–100.0)
Monocytes Absolute: 0.7 K/uL (ref 0.1–1.0)
Monocytes Relative: 9 %
Neutro Abs: 4.9 K/uL (ref 1.7–7.7)
Neutrophils Relative %: 62 %
Platelet Count: 175 K/uL (ref 150–400)
RBC: 3.7 MIL/uL — ABNORMAL LOW (ref 4.22–5.81)
RDW: 14.6 % (ref 11.5–15.5)
WBC Count: 8.1 K/uL (ref 4.0–10.5)
nRBC: 0 % (ref 0.0–0.2)

## 2024-02-21 MED ORDER — SODIUM CHLORIDE 0.9 % IV SOLN: Freq: Once | INTRAVENOUS | Status: AC

## 2024-02-21 MED ORDER — ALBUMIN HUMAN 25 % IV SOLN
25.0000 g | Freq: Once | INTRAVENOUS | Status: AC
  Administered 2024-02-21: 25 g via INTRAVENOUS
  Filled 2024-02-21: qty 100

## 2024-02-21 MED ORDER — MOLNUPIRAVIR EUA 200MG CAPSULE: 4.0000 | ORAL_CAPSULE | Freq: Two times a day (BID) | ORAL | 0 refills | Status: AC

## 2024-02-21 NOTE — Progress Notes (Signed)
 COVID +ve -- acquired infection from wife. Feeling drained. High risk of symptom progression due to Immunocompromised/Immunosuppressed state. Has previously been treated with Paxlovid. Will prescribe Molnupiravir   .Cebastian Neis Kishore Alejandra Barna

## 2024-02-21 NOTE — Patient Instructions (Signed)
 Albumin  Injection What is this medication? ALBUMIN  (al BYOO min) treats low blood volume, which may occur with severe dehydration or bleeding. It works by increasing blood volume so your heart can pump blood to the rest of your body. It may also be used to treat low albumin  levels caused by surgery, infection, or other health conditions. Albumin  is a protein that helps your body balance the level of fluid in your blood vessels. This helps maintain a healthy blood pressure and prevents swelling or edema. This medicine may be used for other purposes; ask your health care provider or pharmacist if you have questions. COMMON BRAND NAME(S): Albuked , Albumarc, Albuminar, Albuminex , AlbuRx , Albutein , Buminate, Flexbumin , Kedbumin , Macrotec, Plasbumin , Plasbumin -20 What should I tell my care team before I take this medication? They need to know if you have any of these conditions: Heart disease Kidney disease Low red blood cell levels (anemia) An unusual or allergic reaction to albumin , other medications, foods, dyes, or preservatives Pregnant or trying to get pregnant Breastfeeding How should I use this medication? This medication is infused into a vein. It is given by your care team in a hospital or clinic setting. Talk to your care team about the use of this medication in children. While it may be given to children for selected conditions, precautions do apply. Overdosage: If you think you have taken too much of this medicine contact a poison control center or emergency room at once. NOTE: This medicine is only for you. Do not share this medicine with others. What if I miss a dose? This does not apply. What may interact with this medication? Interactions are not expected. This list may not describe all possible interactions. Give your health care provider a list of all the medicines, herbs, non-prescription drugs, or dietary supplements you use. Also tell them if you smoke, drink alcohol, or use  illegal drugs. Some items may interact with your medicine. What should I watch for while using this medication? Your condition will be monitored carefully while you are receiving this medication. This product is derived from human plasma. Talk to your care team about the risks and benefits of this medication. What side effects may I notice from receiving this medication? Side effects that you should report to your care team as soon as possible: Allergic reactions--skin rash, itching, hives, swelling of the face, lips, tongue, or throat Heart failure--shortness of breath, swelling of the ankles, feet, or hands, sudden weight gain, unusual weakness or fatigue Increase in blood pressure Shortness of breath or trouble breathing, cough, unusual weakness or fatigue, blue skin or lips Side effects that usually do not require medical attention (report these to your care team if they continue or are bothersome): Flushing Headache Nausea This list may not describe all possible side effects. Call your doctor for medical advice about side effects. You may report side effects to FDA at 1-800-FDA-1088. Where should I keep my medication? This medication is given in a hospital or clinic. It will not be stored at home. NOTE: This sheet is a summary. It may not cover all possible information. If you have questions about this medicine, talk to your doctor, pharmacist, or health care provider.  2024 Elsevier/Gold Standard (2023-05-19 00:00:00)

## 2024-02-22 ENCOUNTER — Other Ambulatory Visit (HOSPITAL_COMMUNITY): Payer: Self-pay

## 2024-02-22 LAB — KAPPA/LAMBDA LIGHT CHAINS
Kappa free light chain: 11.8 mg/L (ref 3.3–19.4)
Kappa, lambda light chain ratio: 0.5 (ref 0.26–1.65)
Lambda free light chains: 23.6 mg/L (ref 5.7–26.3)

## 2024-02-23 ENCOUNTER — Ambulatory Visit

## 2024-02-23 ENCOUNTER — Inpatient Hospital Stay

## 2024-02-23 VITALS — BP 129/92 | HR 66 | Temp 98.7°F | Resp 17

## 2024-02-23 DIAGNOSIS — I959 Hypotension, unspecified: Secondary | ICD-10-CM

## 2024-02-23 DIAGNOSIS — Z5112 Encounter for antineoplastic immunotherapy: Secondary | ICD-10-CM | POA: Diagnosis not present

## 2024-02-23 DIAGNOSIS — E8581 Light chain (AL) amyloidosis: Secondary | ICD-10-CM | POA: Diagnosis not present

## 2024-02-23 MED ORDER — SODIUM CHLORIDE 0.9 % IV SOLN: INTRAVENOUS | Status: DC

## 2024-02-23 MED ORDER — ALBUMIN HUMAN 25 % IV SOLN
25.0000 g | Freq: Once | INTRAVENOUS | Status: AC
  Administered 2024-02-23: 25 g via INTRAVENOUS
  Filled 2024-02-23: qty 100

## 2024-02-23 NOTE — Patient Instructions (Signed)
 Albumin  Injection What is this medication? ALBUMIN  (al BYOO min) treats low blood volume, which may occur with severe dehydration or bleeding. It works by increasing blood volume so your heart can pump blood to the rest of your body. It may also be used to treat low albumin  levels caused by surgery, infection, or other health conditions. Albumin  is a protein that helps your body balance the level of fluid in your blood vessels. This helps maintain a healthy blood pressure and prevents swelling or edema. This medicine may be used for other purposes; ask your health care provider or pharmacist if you have questions. COMMON BRAND NAME(S): Albuked , Albumarc, Albuminar, Albuminex , AlbuRx , Albutein , Buminate, Flexbumin , Kedbumin , Macrotec, Plasbumin , Plasbumin -20 What should I tell my care team before I take this medication? They need to know if you have any of these conditions: Heart disease Kidney disease Low red blood cell levels (anemia) An unusual or allergic reaction to albumin , other medications, foods, dyes, or preservatives Pregnant or trying to get pregnant Breastfeeding How should I use this medication? This medication is infused into a vein. It is given by your care team in a hospital or clinic setting. Talk to your care team about the use of this medication in children. While it may be given to children for selected conditions, precautions do apply. Overdosage: If you think you have taken too much of this medicine contact a poison control center or emergency room at once. NOTE: This medicine is only for you. Do not share this medicine with others. What if I miss a dose? This does not apply. What may interact with this medication? Interactions are not expected. This list may not describe all possible interactions. Give your health care provider a list of all the medicines, herbs, non-prescription drugs, or dietary supplements you use. Also tell them if you smoke, drink alcohol, or use  illegal drugs. Some items may interact with your medicine. What should I watch for while using this medication? Your condition will be monitored carefully while you are receiving this medication. This product is derived from human plasma. Talk to your care team about the risks and benefits of this medication. What side effects may I notice from receiving this medication? Side effects that you should report to your care team as soon as possible: Allergic reactions--skin rash, itching, hives, swelling of the face, lips, tongue, or throat Heart failure--shortness of breath, swelling of the ankles, feet, or hands, sudden weight gain, unusual weakness or fatigue Increase in blood pressure Shortness of breath or trouble breathing, cough, unusual weakness or fatigue, blue skin or lips Side effects that usually do not require medical attention (report these to your care team if they continue or are bothersome): Flushing Headache Nausea This list may not describe all possible side effects. Call your doctor for medical advice about side effects. You may report side effects to FDA at 1-800-FDA-1088. Where should I keep my medication? This medication is given in a hospital or clinic. It will not be stored at home. NOTE: This sheet is a summary. It may not cover all possible information. If you have questions about this medicine, talk to your doctor, pharmacist, or health care provider.  2024 Elsevier/Gold Standard (2023-05-19 00:00:00)

## 2024-02-26 ENCOUNTER — Inpatient Hospital Stay

## 2024-02-26 VITALS — BP 113/73 | HR 72 | Temp 98.1°F | Resp 20

## 2024-02-26 DIAGNOSIS — E8581 Light chain (AL) amyloidosis: Secondary | ICD-10-CM | POA: Diagnosis not present

## 2024-02-26 DIAGNOSIS — I959 Hypotension, unspecified: Secondary | ICD-10-CM

## 2024-02-26 DIAGNOSIS — Z5112 Encounter for antineoplastic immunotherapy: Secondary | ICD-10-CM | POA: Diagnosis not present

## 2024-02-26 LAB — MULTIPLE MYELOMA PANEL, SERUM
Albumin SerPl Elph-Mcnc: 1.2 g/dL — ABNORMAL LOW (ref 2.9–4.4)
Albumin/Glob SerPl: 0.7 (ref 0.7–1.7)
Alpha 1: 0.1 g/dL (ref 0.0–0.4)
Alpha2 Glob SerPl Elph-Mcnc: 1.2 g/dL — ABNORMAL HIGH (ref 0.4–1.0)
B-Globulin SerPl Elph-Mcnc: 0.6 g/dL — ABNORMAL LOW (ref 0.7–1.3)
Gamma Glob SerPl Elph-Mcnc: 0.2 g/dL — ABNORMAL LOW (ref 0.4–1.8)
Globulin, Total: 2 g/dL — ABNORMAL LOW (ref 2.2–3.9)
IgA: 40 mg/dL — ABNORMAL LOW (ref 90–386)
IgG (Immunoglobin G), Serum: 189 mg/dL — ABNORMAL LOW (ref 603–1613)
IgM (Immunoglobulin M), Srm: 44 mg/dL (ref 20–172)
Total Protein ELP: 3.2 g/dL — ABNORMAL LOW (ref 6.0–8.5)

## 2024-02-26 MED ORDER — ALBUMIN HUMAN 25 % IV SOLN
25.0000 g | Freq: Once | INTRAVENOUS | Status: AC
  Administered 2024-02-26: 25 g via INTRAVENOUS
  Filled 2024-02-26: qty 100

## 2024-02-26 NOTE — Patient Instructions (Signed)
 Albumin  Injection What is this medication? ALBUMIN  (al BYOO min) treats low blood volume, which may occur with severe dehydration or bleeding. It works by increasing blood volume so your heart can pump blood to the rest of your body. It may also be used to treat low albumin  levels caused by surgery, infection, or other health conditions. Albumin  is a protein that helps your body balance the level of fluid in your blood vessels. This helps maintain a healthy blood pressure and prevents swelling or edema. This medicine may be used for other purposes; ask your health care provider or pharmacist if you have questions. COMMON BRAND NAME(S): Albuked , Albumarc, Albuminar, Albuminex , AlbuRx , Albutein , Buminate, Flexbumin , Kedbumin , Macrotec, Plasbumin , Plasbumin -20 What should I tell my care team before I take this medication? They need to know if you have any of these conditions: Heart disease Kidney disease Low red blood cell levels (anemia) An unusual or allergic reaction to albumin , other medications, foods, dyes, or preservatives Pregnant or trying to get pregnant Breastfeeding How should I use this medication? This medication is infused into a vein. It is given by your care team in a hospital or clinic setting. Talk to your care team about the use of this medication in children. While it may be given to children for selected conditions, precautions do apply. Overdosage: If you think you have taken too much of this medicine contact a poison control center or emergency room at once. NOTE: This medicine is only for you. Do not share this medicine with others. What if I miss a dose? This does not apply. What may interact with this medication? Interactions are not expected. This list may not describe all possible interactions. Give your health care provider a list of all the medicines, herbs, non-prescription drugs, or dietary supplements you use. Also tell them if you smoke, drink alcohol, or use  illegal drugs. Some items may interact with your medicine. What should I watch for while using this medication? Your condition will be monitored carefully while you are receiving this medication. This product is derived from human plasma. Talk to your care team about the risks and benefits of this medication. What side effects may I notice from receiving this medication? Side effects that you should report to your care team as soon as possible: Allergic reactions--skin rash, itching, hives, swelling of the face, lips, tongue, or throat Heart failure--shortness of breath, swelling of the ankles, feet, or hands, sudden weight gain, unusual weakness or fatigue Increase in blood pressure Shortness of breath or trouble breathing, cough, unusual weakness or fatigue, blue skin or lips Side effects that usually do not require medical attention (report these to your care team if they continue or are bothersome): Flushing Headache Nausea This list may not describe all possible side effects. Call your doctor for medical advice about side effects. You may report side effects to FDA at 1-800-FDA-1088. Where should I keep my medication? This medication is given in a hospital or clinic. It will not be stored at home. NOTE: This sheet is a summary. It may not cover all possible information. If you have questions about this medicine, talk to your doctor, pharmacist, or health care provider.  2024 Elsevier/Gold Standard (2023-05-19 00:00:00)

## 2024-02-29 ENCOUNTER — Ambulatory Visit

## 2024-02-29 ENCOUNTER — Other Ambulatory Visit

## 2024-02-29 ENCOUNTER — Other Ambulatory Visit: Payer: Self-pay

## 2024-02-29 ENCOUNTER — Other Ambulatory Visit (HOSPITAL_COMMUNITY): Payer: Self-pay

## 2024-02-29 ENCOUNTER — Inpatient Hospital Stay

## 2024-02-29 VITALS — BP 98/58 | HR 65 | Temp 97.8°F | Resp 20

## 2024-02-29 DIAGNOSIS — Z5112 Encounter for antineoplastic immunotherapy: Secondary | ICD-10-CM | POA: Diagnosis not present

## 2024-02-29 DIAGNOSIS — E8581 Light chain (AL) amyloidosis: Secondary | ICD-10-CM | POA: Diagnosis not present

## 2024-02-29 DIAGNOSIS — I959 Hypotension, unspecified: Secondary | ICD-10-CM

## 2024-02-29 MED ORDER — HEPARIN SOD (PORK) LOCK FLUSH 100 UNIT/ML IV SOLN: 250.0000 [IU] | Freq: Once | INTRAVENOUS | Status: DC | PRN

## 2024-02-29 MED ORDER — ALTEPLASE 2 MG IJ SOLR: 2.0000 mg | Freq: Once | INTRAMUSCULAR | Status: DC | PRN

## 2024-02-29 MED ORDER — ALBUMIN HUMAN 25 % IV SOLN
25.0000 g | Freq: Once | INTRAVENOUS | Status: AC
  Administered 2024-02-29: 25 g via INTRAVENOUS
  Filled 2024-02-29: qty 100

## 2024-02-29 MED ORDER — SODIUM CHLORIDE 0.9% FLUSH: 3.0000 mL | Freq: Once | INTRAVENOUS | Status: DC | PRN

## 2024-02-29 MED ORDER — HEPARIN SOD (PORK) LOCK FLUSH 100 UNIT/ML IV SOLN: 500.0000 [IU] | Freq: Once | INTRAVENOUS | Status: DC | PRN

## 2024-02-29 MED ORDER — SODIUM CHLORIDE 0.9% FLUSH: 10.0000 mL | Freq: Once | INTRAVENOUS | Status: DC | PRN

## 2024-02-29 MED ORDER — SODIUM CHLORIDE 0.9 % IV SOLN: INTRAVENOUS | Status: DC

## 2024-02-29 NOTE — Progress Notes (Signed)
 Specialty Pharmacy Refill Coordination Note  Joseph [name]. is a 58 y.o. male contacted today regarding refills of specialty medication(s) Droxidopa  (NORTHERA )   Patient requested Delivery   Delivery date: 03/01/24   Verified address: [address]  [place] [address]   Medication will be filled on 02/29/24.

## 2024-02-29 NOTE — Patient Instructions (Signed)
 Albumin  Injection What is this medication? ALBUMIN  (al BYOO min) treats low blood volume, which may occur with severe dehydration or bleeding. It works by increasing blood volume so your heart can pump blood to the rest of your body. It may also be used to treat low albumin  levels caused by surgery, infection, or other health conditions. Albumin  is a protein that helps your body balance the level of fluid in your blood vessels. This helps maintain a healthy blood pressure and prevents swelling or edema. This medicine may be used for other purposes; ask your health care provider or pharmacist if you have questions. COMMON BRAND NAME(S): Albuked , Albumarc, Albuminar, Albuminex , AlbuRx , Albutein , Buminate, Flexbumin , Kedbumin , Macrotec, Plasbumin , Plasbumin -20 What should I tell my care team before I take this medication? They need to know if you have any of these conditions: Heart disease Kidney disease Low red blood cell levels (anemia) An unusual or allergic reaction to albumin , other medications, foods, dyes, or preservatives Pregnant or trying to get pregnant Breastfeeding How should I use this medication? This medication is infused into a vein. It is given by your care team in a hospital or clinic setting. Talk to your care team about the use of this medication in children. While it may be given to children for selected conditions, precautions do apply. Overdosage: If you think you have taken too much of this medicine contact a poison control center or emergency room at once. NOTE: This medicine is only for you. Do not share this medicine with others. What if I miss a dose? This does not apply. What may interact with this medication? Interactions are not expected. This list may not describe all possible interactions. Give your health care provider a list of all the medicines, herbs, non-prescription drugs, or dietary supplements you use. Also tell them if you smoke, drink alcohol, or use  illegal drugs. Some items may interact with your medicine. What should I watch for while using this medication? Your condition will be monitored carefully while you are receiving this medication. This product is derived from human plasma. Talk to your care team about the risks and benefits of this medication. What side effects may I notice from receiving this medication? Side effects that you should report to your care team as soon as possible: Allergic reactions--skin rash, itching, hives, swelling of the face, lips, tongue, or throat Heart failure--shortness of breath, swelling of the ankles, feet, or hands, sudden weight gain, unusual weakness or fatigue Increase in blood pressure Shortness of breath or trouble breathing, cough, unusual weakness or fatigue, blue skin or lips Side effects that usually do not require medical attention (report these to your care team if they continue or are bothersome): Flushing Headache Nausea This list may not describe all possible side effects. Call your doctor for medical advice about side effects. You may report side effects to FDA at 1-800-FDA-1088. Where should I keep my medication? This medication is given in a hospital or clinic. It will not be stored at home. NOTE: This sheet is a summary. It may not cover all possible information. If you have questions about this medicine, talk to your doctor, pharmacist, or health care provider.  2024 Elsevier/Gold Standard (2023-05-19 00:00:00)

## 2024-03-05 ENCOUNTER — Other Ambulatory Visit

## 2024-03-05 ENCOUNTER — Ambulatory Visit: Admitting: Physician Assistant

## 2024-03-05 ENCOUNTER — Ambulatory Visit

## 2024-03-05 ENCOUNTER — Inpatient Hospital Stay

## 2024-03-05 VITALS — BP 101/69 | HR 65 | Temp 97.5°F | Resp 17 | Wt 289.0 lb

## 2024-03-05 DIAGNOSIS — Z5112 Encounter for antineoplastic immunotherapy: Secondary | ICD-10-CM | POA: Diagnosis not present

## 2024-03-05 DIAGNOSIS — C9000 Multiple myeloma not having achieved remission: Secondary | ICD-10-CM

## 2024-03-05 DIAGNOSIS — E8581 Light chain (AL) amyloidosis: Secondary | ICD-10-CM

## 2024-03-05 DIAGNOSIS — I959 Hypotension, unspecified: Secondary | ICD-10-CM

## 2024-03-05 LAB — CBC WITH DIFFERENTIAL (CANCER CENTER ONLY)
Abs Immature Granulocytes: 0 K/uL (ref 0.00–0.07)
Basophils Absolute: 0 K/uL (ref 0.0–0.1)
Basophils Relative: 0 %
Eosinophils Absolute: 0.1 K/uL (ref 0.0–0.5)
Eosinophils Relative: 1 %
HCT: 39.8 % (ref 39.0–52.0)
Hemoglobin: 13.3 g/dL (ref 13.0–17.0)
Immature Granulocytes: 0 %
Lymphocytes Relative: 29 %
Lymphs Abs: 2.5 K/uL (ref 0.7–4.0)
MCH: 32.5 pg (ref 26.0–34.0)
MCHC: 33.4 g/dL (ref 30.0–36.0)
MCV: 97.3 fL (ref 80.0–100.0)
Monocytes Absolute: 0.8 K/uL (ref 0.1–1.0)
Monocytes Relative: 9 %
Neutro Abs: 5.2 K/uL (ref 1.7–7.7)
Neutrophils Relative %: 61 %
Platelet Count: 190 K/uL (ref 150–400)
RBC: 4.09 MIL/uL — ABNORMAL LOW (ref 4.22–5.81)
RDW: 14.4 % (ref 11.5–15.5)
WBC Count: 8.6 K/uL (ref 4.0–10.5)
nRBC: 0 % (ref 0.0–0.2)

## 2024-03-05 LAB — CMP (CANCER CENTER ONLY)
ALT: 38 U/L (ref 0–44)
AST: 49 U/L — ABNORMAL HIGH (ref 15–41)
Albumin: 1.7 g/dL — ABNORMAL LOW (ref 3.5–5.0)
Alkaline Phosphatase: 89 U/L (ref 38–126)
Anion gap: 1 — ABNORMAL LOW (ref 5–15)
BUN: 18 mg/dL (ref 6–20)
CO2: 32 mmol/L (ref 22–32)
Calcium: 7 mg/dL — ABNORMAL LOW (ref 8.9–10.3)
Chloride: 106 mmol/L (ref 98–111)
Creatinine: 0.56 mg/dL — ABNORMAL LOW (ref 0.61–1.24)
GFR, Estimated: >60 mL/min (ref >60)
Glucose, Bld: 108 mg/dL — ABNORMAL HIGH (ref 70–99)
Potassium: 4.1 mmol/L (ref 3.5–5.1)
Sodium: 139 mmol/L (ref 135–145)
Total Bilirubin: 0.3 mg/dL (ref 0.0–1.2)
Total Protein: 3.7 g/dL — ABNORMAL LOW (ref 6.5–8.1)

## 2024-03-05 MED ORDER — ALBUMIN HUMAN 25 % IV SOLN
25.0000 g | Freq: Once | INTRAVENOUS | Status: AC
  Administered 2024-03-05: 25 g via INTRAVENOUS
  Filled 2024-03-05: qty 100

## 2024-03-05 MED ORDER — ACETAMINOPHEN 325 MG PO TABS
650.0000 mg | ORAL_TABLET | Freq: Once | ORAL | Status: AC
  Administered 2024-03-05: 650 mg via ORAL
  Filled 2024-03-05: qty 2

## 2024-03-05 MED ORDER — DEXAMETHASONE 4 MG PO TABS
8.0000 mg | ORAL_TABLET | Freq: Once | ORAL | Status: AC
  Administered 2024-03-05: 8 mg via ORAL
  Filled 2024-03-05: qty 2

## 2024-03-05 MED ORDER — DARATUMUMAB-HYALURONIDASE-FIHJ 1800-30000 MG-UT/15ML ~~LOC~~ SOLN
1800.0000 mg | Freq: Once | SUBCUTANEOUS | Status: AC
  Administered 2024-03-05: 1800 mg via SUBCUTANEOUS
  Filled 2024-03-05: qty 15

## 2024-03-05 MED ORDER — DIPHENHYDRAMINE HCL 25 MG PO CAPS
50.0000 mg | ORAL_CAPSULE | Freq: Once | ORAL | Status: AC
  Administered 2024-03-05: 50 mg via ORAL
  Filled 2024-03-05: qty 2

## 2024-03-05 NOTE — Patient Instructions (Signed)
 Albumin  Injection What is this medication? ALBUMIN  (al BYOO min) treats low blood volume, which may occur with severe dehydration or bleeding. It works by increasing blood volume so your heart can pump blood to the rest of your body. It may also be used to treat low albumin  levels caused by surgery, infection, or other health conditions. Albumin  is a protein that helps your body balance the level of fluid in your blood vessels. This helps maintain a healthy blood pressure and prevents swelling or edema. This medicine may be used for other purposes; ask your health care provider or pharmacist if you have questions. COMMON BRAND NAME(S): Albuked , Albumarc, Albuminar, Albuminex , AlbuRx , Albutein , Buminate, Flexbumin , Kedbumin , Macrotec, Plasbumin , Plasbumin -20 What should I tell my care team before I take this medication? They need to know if you have any of these conditions: Heart disease Kidney disease Low red blood cell levels (anemia) An unusual or allergic reaction to albumin , other medications, foods, dyes, or preservatives Pregnant or trying to get pregnant Breastfeeding How should I use this medication? This medication is infused into a vein. It is given by your care team in a hospital or clinic setting. Talk to your care team about the use of this medication in children. While it may be given to children for selected conditions, precautions do apply. Overdosage: If you think you have taken too much of this medicine contact a poison control center or emergency room at once. NOTE: This medicine is only for you. Do not share this medicine with others. What if I miss a dose? This does not apply. What may interact with this medication? Interactions are not expected. This list may not describe all possible interactions. Give your health care provider a list of all the medicines, herbs, non-prescription drugs, or dietary supplements you use. Also tell them if you smoke, drink alcohol, or use  illegal drugs. Some items may interact with your medicine. What should I watch for while using this medication? Your condition will be monitored carefully while you are receiving this medication. This product is derived from human plasma. Talk to your care team about the risks and benefits of this medication. What side effects may I notice from receiving this medication? Side effects that you should report to your care team as soon as possible: Allergic reactions--skin rash, itching, hives, swelling of the face, lips, tongue, or throat Heart failure--shortness of breath, swelling of the ankles, feet, or hands, sudden weight gain, unusual weakness or fatigue Increase in blood pressure Shortness of breath or trouble breathing, cough, unusual weakness or fatigue, blue skin or lips Side effects that usually do not require medical attention (report these to your care team if they continue or are bothersome): Flushing Headache Nausea This list may not describe all possible side effects. Call your doctor for medical advice about side effects. You may report side effects to FDA at 1-800-FDA-1088. Where should I keep my medication? This medication is given in a hospital or clinic. It will not be stored at home. NOTE: This sheet is a summary. It may not cover all possible information. If you have questions about this medicine, talk to your doctor, pharmacist, or health care provider.  2024 Elsevier/Gold Standard (2023-05-19 00:00:00)  [facility] - A DEPT OF [facility]  Discharge Instructions: Thank you for choosing [facility] to provide your oncology and hematology care.   If you have a lab appointment with the Cancer Center, please go  directly to the Cancer Center and check in at the registration area.   Wear comfortable clothing and clothing appropriate for easy access to any Portacath or PICC line.   We strive to give you quality time with your  provider. You may need to reschedule your appointment if you arrive late (15 or more minutes).  Arriving late affects you and other patients whose appointments are after yours.  Also, if you miss three or more appointments without notifying the office, you may be dismissed from the clinic at the provider's discretion.      For prescription refill requests, have your pharmacy contact our office and allow 72 hours for refills to be completed.    Today you received the following chemotherapy and/or immunotherapy agents: daratumumab -hyaluronidase -fihj and albumin       To help prevent nausea and vomiting after your treatment, we encourage you to take your nausea medication as directed.  BELOW ARE SYMPTOMS THAT SHOULD BE REPORTED IMMEDIATELY: *FEVER GREATER THAN 100.4 F (38 C) OR HIGHER *CHILLS OR SWEATING *NAUSEA AND VOMITING THAT IS NOT CONTROLLED WITH YOUR NAUSEA MEDICATION *UNUSUAL SHORTNESS OF BREATH *UNUSUAL BRUISING OR BLEEDING *URINARY PROBLEMS (pain or burning when urinating, or frequent urination) *BOWEL PROBLEMS (unusual diarrhea, constipation, pain near the anus) TENDERNESS IN MOUTH AND THROAT WITH OR WITHOUT PRESENCE OF ULCERS (sore throat, sores in mouth, or a toothache) UNUSUAL RASH, SWELLING OR PAIN  UNUSUAL VAGINAL DISCHARGE OR ITCHING   Items with * indicate a potential emergency and should be followed up as soon as possible or go to the Emergency Department if any problems should occur.  Please show the CHEMOTHERAPY ALERT CARD or IMMUNOTHERAPY ALERT CARD at check-in to the Emergency Department and triage nurse.  Should you have questions after your visit or need to cancel or reschedule your appointment, please contact [facility] - A DEPT OF [facility]  Dept: [phone]  and follow the prompts.  Office hours are 8:00 a.m. to 4:30 p.m. Monday - Friday. Please note that voicemails left after 4:00 p.m. may not be returned until the following  business day.  We are closed weekends and major holidays. You have access to a nurse at all times for urgent questions. Please call the main number to the clinic Dept: [phone] and follow the prompts.   For any non-urgent questions, you may also contact your provider using MyChart. We now offer e-Visits for anyone 11 and older to request care online for non-urgent symptoms. For details visit [email or web address].   Also download the MyChart app! Go to the app store, search MyChart, open the app, select [facility], and log in with your MyChart username and password.

## 2024-03-08 ENCOUNTER — Inpatient Hospital Stay

## 2024-03-08 ENCOUNTER — Other Ambulatory Visit: Payer: Self-pay

## 2024-03-08 VITALS — BP 111/68 | HR 70 | Temp 97.7°F | Resp 20

## 2024-03-08 DIAGNOSIS — I959 Hypotension, unspecified: Secondary | ICD-10-CM

## 2024-03-08 DIAGNOSIS — E8581 Light chain (AL) amyloidosis: Secondary | ICD-10-CM | POA: Diagnosis not present

## 2024-03-08 DIAGNOSIS — Z5112 Encounter for antineoplastic immunotherapy: Secondary | ICD-10-CM | POA: Diagnosis not present

## 2024-03-08 MED ORDER — SODIUM CHLORIDE 0.9 % IV SOLN: Freq: Once | INTRAVENOUS | Status: AC

## 2024-03-08 MED ORDER — ALBUMIN HUMAN 25 % IV SOLN
25.0000 g | Freq: Once | INTRAVENOUS | Status: AC
  Administered 2024-03-08: 25 g via INTRAVENOUS
  Filled 2024-03-08: qty 50

## 2024-03-11 ENCOUNTER — Inpatient Hospital Stay

## 2024-03-11 VITALS — BP 123/80 | HR 72 | Temp 97.9°F | Resp 18

## 2024-03-11 DIAGNOSIS — E8581 Light chain (AL) amyloidosis: Secondary | ICD-10-CM | POA: Diagnosis not present

## 2024-03-11 DIAGNOSIS — I959 Hypotension, unspecified: Secondary | ICD-10-CM

## 2024-03-11 DIAGNOSIS — Z5112 Encounter for antineoplastic immunotherapy: Secondary | ICD-10-CM | POA: Diagnosis not present

## 2024-03-11 LAB — CMP (CANCER CENTER ONLY)
ALT: 39 U/L (ref 0–44)
AST: 46 U/L — ABNORMAL HIGH (ref 15–41)
Albumin: 1.7 g/dL — ABNORMAL LOW (ref 3.5–5.0)
Alkaline Phosphatase: 85 U/L (ref 38–126)
Anion gap: 1 — ABNORMAL LOW (ref 5–15)
BUN: 18 mg/dL (ref 6–20)
CO2: 35 mmol/L — ABNORMAL HIGH (ref 22–32)
Calcium: 7.1 mg/dL — ABNORMAL LOW (ref 8.9–10.3)
Chloride: 106 mmol/L (ref 98–111)
Creatinine: 0.58 mg/dL — ABNORMAL LOW (ref 0.61–1.24)
GFR, Estimated: >60 mL/min (ref >60)
Glucose, Bld: 99 mg/dL (ref 70–99)
Potassium: 3.7 mmol/L (ref 3.5–5.1)
Sodium: 142 mmol/L (ref 135–145)
Total Bilirubin: 0.3 mg/dL (ref 0.0–1.2)
Total Protein: 3.8 g/dL — ABNORMAL LOW (ref 6.5–8.1)

## 2024-03-11 LAB — CBC WITH DIFFERENTIAL (CANCER CENTER ONLY)
Abs Immature Granulocytes: 0.01 K/uL (ref 0.00–0.07)
Basophils Absolute: 0 K/uL (ref 0.0–0.1)
Basophils Relative: 0 %
Eosinophils Absolute: 0.1 K/uL (ref 0.0–0.5)
Eosinophils Relative: 1 %
HCT: 39.9 % (ref 39.0–52.0)
Hemoglobin: 13.3 g/dL (ref 13.0–17.0)
Immature Granulocytes: 0 %
Lymphocytes Relative: 30 %
Lymphs Abs: 2.5 K/uL (ref 0.7–4.0)
MCH: 32.2 pg (ref 26.0–34.0)
MCHC: 33.3 g/dL (ref 30.0–36.0)
MCV: 96.6 fL (ref 80.0–100.0)
Monocytes Absolute: 0.8 K/uL (ref 0.1–1.0)
Monocytes Relative: 10 %
Neutro Abs: 5 K/uL (ref 1.7–7.7)
Neutrophils Relative %: 59 %
Platelet Count: 203 K/uL (ref 150–400)
RBC: 4.13 MIL/uL — ABNORMAL LOW (ref 4.22–5.81)
RDW: 13.9 % (ref 11.5–15.5)
WBC Count: 8.4 K/uL (ref 4.0–10.5)
nRBC: 0 % (ref 0.0–0.2)

## 2024-03-11 MED ORDER — ALBUMIN HUMAN 25 % IV SOLN
25.0000 g | Freq: Once | INTRAVENOUS | Status: AC
  Administered 2024-03-11: 25 g via INTRAVENOUS
  Filled 2024-03-11: qty 100

## 2024-03-11 NOTE — Patient Instructions (Signed)
 Albumin  Injection What is this medication? ALBUMIN  (al BYOO min) treats low blood volume, which may occur with severe dehydration or bleeding. It works by increasing blood volume so your heart can pump blood to the rest of your body. It may also be used to treat low albumin  levels caused by surgery, infection, or other health conditions. Albumin  is a protein that helps your body balance the level of fluid in your blood vessels. This helps maintain a healthy blood pressure and prevents swelling or edema. This medicine may be used for other purposes; ask your health care provider or pharmacist if you have questions. COMMON BRAND NAME(S): Albuked , Albumarc, Albuminar, Albuminex , AlbuRx , Albutein , Buminate, Flexbumin , Kedbumin , Macrotec, Plasbumin , Plasbumin -20 What should I tell my care team before I take this medication? They need to know if you have any of these conditions: Heart disease Kidney disease Low red blood cell levels (anemia) An unusual or allergic reaction to albumin , other medications, foods, dyes, or preservatives Pregnant or trying to get pregnant Breastfeeding How should I use this medication? This medication is infused into a vein. It is given by your care team in a hospital or clinic setting. Talk to your care team about the use of this medication in children. While it may be given to children for selected conditions, precautions do apply. Overdosage: If you think you have taken too much of this medicine contact a poison control center or emergency room at once. NOTE: This medicine is only for you. Do not share this medicine with others. What if I miss a dose? This does not apply. What may interact with this medication? Interactions are not expected. This list may not describe all possible interactions. Give your health care provider a list of all the medicines, herbs, non-prescription drugs, or dietary supplements you use. Also tell them if you smoke, drink alcohol, or use  illegal drugs. Some items may interact with your medicine. What should I watch for while using this medication? Your condition will be monitored carefully while you are receiving this medication. This product is derived from human plasma. Talk to your care team about the risks and benefits of this medication. What side effects may I notice from receiving this medication? Side effects that you should report to your care team as soon as possible: Allergic reactions--skin rash, itching, hives, swelling of the face, lips, tongue, or throat Heart failure--shortness of breath, swelling of the ankles, feet, or hands, sudden weight gain, unusual weakness or fatigue Increase in blood pressure Shortness of breath or trouble breathing, cough, unusual weakness or fatigue, blue skin or lips Side effects that usually do not require medical attention (report these to your care team if they continue or are bothersome): Flushing Headache Nausea This list may not describe all possible side effects. Call your doctor for medical advice about side effects. You may report side effects to FDA at 1-800-FDA-1088. Where should I keep my medication? This medication is given in a hospital or clinic. It will not be stored at home. NOTE: This sheet is a summary. It may not cover all possible information. If you have questions about this medicine, talk to your doctor, pharmacist, or health care provider.  2024 Elsevier/Gold Standard (2023-05-19 00:00:00)

## 2024-03-13 ENCOUNTER — Ambulatory Visit: Admitting: Urology

## 2024-03-13 ENCOUNTER — Encounter: Payer: Self-pay | Admitting: Hematology

## 2024-03-14 ENCOUNTER — Inpatient Hospital Stay

## 2024-03-14 ENCOUNTER — Ambulatory Visit

## 2024-03-14 VITALS — BP 103/71 | HR 60 | Temp 97.6°F | Resp 17

## 2024-03-14 DIAGNOSIS — E8581 Light chain (AL) amyloidosis: Secondary | ICD-10-CM | POA: Diagnosis not present

## 2024-03-14 DIAGNOSIS — Z5112 Encounter for antineoplastic immunotherapy: Secondary | ICD-10-CM | POA: Diagnosis not present

## 2024-03-14 DIAGNOSIS — I959 Hypotension, unspecified: Secondary | ICD-10-CM

## 2024-03-14 MED ORDER — ALBUMIN HUMAN 25 % IV SOLN
25.0000 g | Freq: Once | INTRAVENOUS | Status: AC
  Administered 2024-03-14: 25 g via INTRAVENOUS
  Filled 2024-03-14: qty 100

## 2024-03-14 MED ORDER — SODIUM CHLORIDE 0.9 % IV SOLN: Freq: Once | INTRAVENOUS | Status: AC

## 2024-03-15 ENCOUNTER — Ambulatory Visit: Admitting: Urology

## 2024-03-18 ENCOUNTER — Telehealth: Payer: Self-pay | Admitting: Hematology

## 2024-03-18 ENCOUNTER — Inpatient Hospital Stay

## 2024-03-18 ENCOUNTER — Inpatient Hospital Stay (HOSPITAL_BASED_OUTPATIENT_CLINIC_OR_DEPARTMENT_OTHER): Admitting: Hematology

## 2024-03-18 VITALS — BP 81/52 | HR 71 | Temp 97.3°F | Resp 20 | Wt 288.2 lb

## 2024-03-18 VITALS — BP 146/84 | HR 68 | Temp 98.0°F | Resp 18

## 2024-03-18 DIAGNOSIS — L97911 Non-pressure chronic ulcer of unspecified part of right lower leg limited to breakdown of skin: Secondary | ICD-10-CM

## 2024-03-18 DIAGNOSIS — E8581 Light chain (AL) amyloidosis: Secondary | ICD-10-CM

## 2024-03-18 DIAGNOSIS — C9000 Multiple myeloma not having achieved remission: Secondary | ICD-10-CM

## 2024-03-18 DIAGNOSIS — I959 Hypotension, unspecified: Secondary | ICD-10-CM

## 2024-03-18 DIAGNOSIS — Z5112 Encounter for antineoplastic immunotherapy: Secondary | ICD-10-CM | POA: Diagnosis not present

## 2024-03-18 LAB — CMP (CANCER CENTER ONLY)
ALT: 34 U/L (ref 0–44)
AST: 36 U/L (ref 15–41)
Albumin: 1.7 g/dL — ABNORMAL LOW (ref 3.5–5.0)
Alkaline Phosphatase: 85 U/L (ref 38–126)
Anion gap: 3 — ABNORMAL LOW (ref 5–15)
BUN: 25 mg/dL — ABNORMAL HIGH (ref 6–20)
CO2: 32 mmol/L (ref 22–32)
Calcium: 7.3 mg/dL — ABNORMAL LOW (ref 8.9–10.3)
Chloride: 106 mmol/L (ref 98–111)
Creatinine: 0.63 mg/dL (ref 0.61–1.24)
GFR, Estimated: >60 mL/min (ref >60)
Glucose, Bld: 100 mg/dL — ABNORMAL HIGH (ref 70–99)
Potassium: 3.9 mmol/L (ref 3.5–5.1)
Sodium: 141 mmol/L (ref 135–145)
Total Bilirubin: 0.2 mg/dL (ref 0.0–1.2)
Total Protein: 3.7 g/dL — ABNORMAL LOW (ref 6.5–8.1)

## 2024-03-18 LAB — CBC WITH DIFFERENTIAL (CANCER CENTER ONLY)
Abs Immature Granulocytes: 0.04 K/uL (ref 0.00–0.07)
Basophils Absolute: 0 K/uL (ref 0.0–0.1)
Basophils Relative: 0 %
Eosinophils Absolute: 0 K/uL (ref 0.0–0.5)
Eosinophils Relative: 0 %
HCT: 35.8 % — ABNORMAL LOW (ref 39.0–52.0)
Hemoglobin: 11.9 g/dL — ABNORMAL LOW (ref 13.0–17.0)
Immature Granulocytes: 0 %
Lymphocytes Relative: 18 %
Lymphs Abs: 2.3 K/uL (ref 0.7–4.0)
MCH: 32.3 pg (ref 26.0–34.0)
MCHC: 33.2 g/dL (ref 30.0–36.0)
MCV: 97.3 fL (ref 80.0–100.0)
Monocytes Absolute: 1.1 K/uL — ABNORMAL HIGH (ref 0.1–1.0)
Monocytes Relative: 8 %
Neutro Abs: 9.2 K/uL — ABNORMAL HIGH (ref 1.7–7.7)
Neutrophils Relative %: 74 %
Platelet Count: 183 K/uL (ref 150–400)
RBC: 3.68 MIL/uL — ABNORMAL LOW (ref 4.22–5.81)
RDW: 13.7 % (ref 11.5–15.5)
WBC Count: 12.7 K/uL — ABNORMAL HIGH (ref 4.0–10.5)
nRBC: 0 % (ref 0.0–0.2)

## 2024-03-18 MED ORDER — DARATUMUMAB-HYALURONIDASE-FIHJ 1800-30000 MG-UT/15ML ~~LOC~~ SOLN
1800.0000 mg | Freq: Once | SUBCUTANEOUS | Status: AC
  Administered 2024-03-18: 1800 mg via SUBCUTANEOUS
  Filled 2024-03-18: qty 15

## 2024-03-18 MED ORDER — ALBUMIN HUMAN 25 % IV SOLN
25.0000 g | Freq: Once | INTRAVENOUS | Status: AC
  Administered 2024-03-18: 25 g via INTRAVENOUS
  Filled 2024-03-18: qty 100

## 2024-03-18 MED ORDER — DEXAMETHASONE 4 MG PO TABS
8.0000 mg | ORAL_TABLET | Freq: Once | ORAL | Status: AC
  Administered 2024-03-18: 8 mg via ORAL
  Filled 2024-03-18: qty 2

## 2024-03-18 MED ORDER — ACETAMINOPHEN 325 MG PO TABS
650.0000 mg | ORAL_TABLET | Freq: Once | ORAL | Status: AC
  Administered 2024-03-18: 650 mg via ORAL
  Filled 2024-03-18: qty 2

## 2024-03-18 MED ORDER — SODIUM CHLORIDE 0.9 % IV SOLN: INTRAVENOUS | Status: DC

## 2024-03-18 MED ORDER — DIPHENHYDRAMINE HCL 25 MG PO CAPS
50.0000 mg | ORAL_CAPSULE | Freq: Once | ORAL | Status: AC
  Administered 2024-03-18: 50 mg via ORAL
  Filled 2024-03-18: qty 2
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
## 2024-03-18 NOTE — Patient Instructions (Signed)
 [facility] - A DEPT OF . [facility]  Discharge Instructions: Thank you for choosing [facility] to provide your oncology and hematology care.   If you have a lab appointment with the Cancer Center, please go directly to the Cancer Center and check in at the registration area.   Wear comfortable clothing and clothing appropriate for easy access to any Portacath or PICC line.   We strive to give you quality time with your provider. You may need to reschedule your appointment if you arrive late (15 or more minutes).  Arriving late affects you and other patients whose appointments are after yours.  Also, if you miss three or more appointments without notifying the office, you may be dismissed from the clinic at the provider's discretion.      For prescription refill requests, have your pharmacy contact our office and allow 72 hours for refills to be completed.    Today you received the following chemotherapy and/or immunotherapy agent: Daratumumab  (Darzalex  Faspro)   To help prevent nausea and vomiting after your treatment, we encourage you to take your nausea medication as directed.  BELOW ARE SYMPTOMS THAT SHOULD BE REPORTED IMMEDIATELY: *FEVER GREATER THAN 100.4 F (38 C) OR HIGHER *CHILLS OR SWEATING *NAUSEA AND VOMITING THAT IS NOT CONTROLLED WITH YOUR NAUSEA MEDICATION *UNUSUAL SHORTNESS OF BREATH *UNUSUAL BRUISING OR BLEEDING *URINARY PROBLEMS (pain or burning when urinating, or frequent urination) *BOWEL PROBLEMS (unusual diarrhea, constipation, pain near the anus) TENDERNESS IN MOUTH AND THROAT WITH OR WITHOUT PRESENCE OF ULCERS (sore throat, sores in mouth, or a toothache) UNUSUAL RASH, SWELLING OR PAIN  UNUSUAL VAGINAL DISCHARGE OR ITCHING   Items with * indicate a potential emergency and should be followed up as soon as possible or go to the Emergency Department if any problems should occur.  Please show the CHEMOTHERAPY ALERT CARD  or IMMUNOTHERAPY ALERT CARD at check-in to the Emergency Department and triage nurse.  Should you have questions after your visit or need to cancel or reschedule your appointment, please contact [facility] - A DEPT [facility]  Dept: [phone]  and follow the prompts.  Office hours are 8:00 a.m. to 4:30 p.m. Monday - Friday. Please note that voicemails left after 4:00 p.m. may not be returned until the following business day.  We are closed weekends and major holidays. You have access to a nurse at all times for urgent questions. Please call the main number to the clinic Dept: [phone] and follow the prompts.   For any non-urgent questions, you may also contact your provider using MyChart. We now offer e-Visits for anyone 57 and older to request care online for non-urgent symptoms. For details visit [email or web address].   Also download the MyChart app! Go to the app store, search MyChart, open the app, select [facility], and log in with your MyChart username and password.  Albumin  Injection What is this medication? ALBUMIN  (al BYOO min) treats low blood volume, which may occur with severe dehydration or bleeding. It works by increasing blood volume so your heart can pump blood to the rest of your body. It may also be used to treat low albumin  levels caused by surgery, infection, or other health conditions. Albumin  is a protein that helps your body balance the level of fluid in your blood vessels. This helps maintain a healthy blood pressure and prevents swelling or edema. This medicine may be used for  other purposes; ask your health care provider or pharmacist if you have questions. COMMON BRAND NAME(S): Albuked , Albumarc, Albuminar, Albuminex , AlbuRx , Albutein , Buminate, Flexbumin , Kedbumin , Macrotec, Plasbumin , Plasbumin -20 What should I tell my care team before I take this medication? They need to know if you have any of these conditions: Heart  disease Kidney disease Low red blood cell levels (anemia) An unusual or allergic reaction to albumin , other medications, foods, dyes, or preservatives Pregnant or trying to get pregnant Breastfeeding How should I use this medication? This medication is infused into a vein. It is given by your care team in a hospital or clinic setting. Talk to your care team about the use of this medication in children. While it may be given to children for selected conditions, precautions do apply. Overdosage: If you think you have taken too much of this medicine contact a poison control center or emergency room at once. NOTE: This medicine is only for you. Do not share this medicine with others. What if I miss a dose? This does not apply. What may interact with this medication? Interactions are not expected. This list may not describe all possible interactions. Give your health care provider a list of all the medicines, herbs, non-prescription drugs, or dietary supplements you use. Also tell them if you smoke, drink alcohol, or use illegal drugs. Some items may interact with your medicine. What should I watch for while using this medication? Your condition will be monitored carefully while you are receiving this medication. This product is derived from human plasma. Talk to your care team about the risks and benefits of this medication. What side effects may I notice from receiving this medication? Side effects that you should report to your care team as soon as possible: Allergic reactions--skin rash, itching, hives, swelling of the face, lips, tongue, or throat Heart failure--shortness of breath, swelling of the ankles, feet, or hands, sudden weight gain, unusual weakness or fatigue Increase in blood pressure Shortness of breath or trouble breathing, cough, unusual weakness or fatigue, blue skin or lips Side effects that usually do not require medical attention (report these to your care team if they  continue or are bothersome): Flushing Headache Nausea This list may not describe all possible side effects. Call your doctor for medical advice about side effects. You may report side effects to FDA at 1-800-FDA-1088. Where should I keep my medication? This medication is given in a hospital or clinic. It will not be stored at home. NOTE: This sheet is a summary. It may not cover all possible information. If you have questions about this medicine, talk to your doctor, pharmacist, or health care provider.  2024 Elsevier/Gold Standard (2023-05-19 00:00:00)

## 2024-03-18 NOTE — Telephone Encounter (Signed)
 Joseph [name] has been scheduled and is aware of his appointments scheduled out until 10/31.

## 2024-03-19 LAB — KAPPA/LAMBDA LIGHT CHAINS
Kappa free light chain: 13.6 mg/L (ref 3.3–19.4)
Kappa, lambda light chain ratio: 0.58 (ref 0.26–1.65)
Lambda free light chains: 23.3 mg/L (ref 5.7–26.3)

## 2024-03-20 LAB — MULTIPLE MYELOMA PANEL, SERUM
Albumin SerPl Elph-Mcnc: 1.1 g/dL — ABNORMAL LOW (ref 2.9–4.4)
Albumin/Glob SerPl: 0.6 — ABNORMAL LOW (ref 0.7–1.7)
Alpha 1: 0.1 g/dL (ref 0.0–0.4)
Alpha2 Glob SerPl Elph-Mcnc: 1.1 g/dL — ABNORMAL HIGH (ref 0.4–1.0)
B-Globulin SerPl Elph-Mcnc: 0.5 g/dL — ABNORMAL LOW (ref 0.7–1.3)
Gamma Glob SerPl Elph-Mcnc: 0.2 g/dL — ABNORMAL LOW (ref 0.4–1.8)
Globulin, Total: 1.9 g/dL — ABNORMAL LOW (ref 2.2–3.9)
IgA: 45 mg/dL — ABNORMAL LOW (ref 90–386)
IgG (Immunoglobin G), Serum: 189 mg/dL — ABNORMAL LOW (ref 603–1613)
IgM (Immunoglobulin M), Srm: 60 mg/dL (ref 20–172)
Total Protein ELP: 3 g/dL — ABNORMAL LOW (ref 6.0–8.5)

## 2024-03-21 ENCOUNTER — Other Ambulatory Visit: Payer: Self-pay

## 2024-03-21 ENCOUNTER — Inpatient Hospital Stay

## 2024-03-21 ENCOUNTER — Telehealth (HOSPITAL_COMMUNITY): Payer: Self-pay | Admitting: Cardiology

## 2024-03-21 ENCOUNTER — Other Ambulatory Visit (HOSPITAL_COMMUNITY): Payer: Self-pay | Admitting: Cardiology

## 2024-03-21 ENCOUNTER — Inpatient Hospital Stay: Attending: Internal Medicine

## 2024-03-21 ENCOUNTER — Ambulatory Visit

## 2024-03-21 ENCOUNTER — Other Ambulatory Visit (HOSPITAL_COMMUNITY): Payer: Self-pay

## 2024-03-21 VITALS — BP 133/85 | HR 72 | Temp 97.5°F | Resp 18

## 2024-03-21 DIAGNOSIS — Z5112 Encounter for antineoplastic immunotherapy: Secondary | ICD-10-CM | POA: Diagnosis present

## 2024-03-21 DIAGNOSIS — E8581 Light chain (AL) amyloidosis: Secondary | ICD-10-CM | POA: Diagnosis present

## 2024-03-21 DIAGNOSIS — I959 Hypotension, unspecified: Secondary | ICD-10-CM

## 2024-03-21 MED ORDER — DROXIDOPA 100 MG PO CAPS
100.0000 mg | ORAL_CAPSULE | Freq: Three times a day (TID) | ORAL | 3 refills | Status: AC
  Filled 2024-03-21 – 2024-03-25 (×2): qty 90, 30d supply, fill #0
  Filled 2024-04-25 – 2024-05-14 (×2): qty 90, 30d supply, fill #1
  Filled 2024-06-12: qty 90, 30d supply, fill #2
  Filled 2024-07-11 – 2024-07-16 (×3): qty 90, 30d supply, fill #3

## 2024-03-21 MED ORDER — ALBUMIN HUMAN 25 % IV SOLN
25.0000 g | Freq: Once | INTRAVENOUS | Status: AC
  Administered 2024-03-21: 25 g via INTRAVENOUS
  Filled 2024-03-21: qty 100

## 2024-03-21 NOTE — Telephone Encounter (Signed)
 Patient aware and voiced understanding

## 2024-03-21 NOTE — Patient Instructions (Signed)
 Albumin  Injection What is this medication? ALBUMIN  (al BYOO min) treats low blood volume, which may occur with severe dehydration or bleeding. It works by increasing blood volume so your heart can pump blood to the rest of your body. It may also be used to treat low albumin  levels caused by surgery, infection, or other health conditions. Albumin  is a protein that helps your body balance the level of fluid in your blood vessels. This helps maintain a healthy blood pressure and prevents swelling or edema. This medicine may be used for other purposes; ask your health care provider or pharmacist if you have questions. COMMON BRAND NAME(S): Albuked , Albumarc, Albuminar, Albuminex , AlbuRx , Albutein , Buminate, Flexbumin , Kedbumin , Macrotec, Plasbumin , Plasbumin -20 What should I tell my care team before I take this medication? They need to know if you have any of these conditions: Heart disease Kidney disease Low red blood cell levels (anemia) An unusual or allergic reaction to albumin , other medications, foods, dyes, or preservatives Pregnant or trying to get pregnant Breastfeeding How should I use this medication? This medication is infused into a vein. It is given by your care team in a hospital or clinic setting. Talk to your care team about the use of this medication in children. While it may be given to children for selected conditions, precautions do apply. Overdosage: If you think you have taken too much of this medicine contact a poison control center or emergency room at once. NOTE: This medicine is only for you. Do not share this medicine with others. What if I miss a dose? This does not apply. What may interact with this medication? Interactions are not expected. This list may not describe all possible interactions. Give your health care provider a list of all the medicines, herbs, non-prescription drugs, or dietary supplements you use. Also tell them if you smoke, drink alcohol, or use  illegal drugs. Some items may interact with your medicine. What should I watch for while using this medication? Your condition will be monitored carefully while you are receiving this medication. This product is derived from human plasma. Talk to your care team about the risks and benefits of this medication. What side effects may I notice from receiving this medication? Side effects that you should report to your care team as soon as possible: Allergic reactions--skin rash, itching, hives, swelling of the face, lips, tongue, or throat Heart failure--shortness of breath, swelling of the ankles, feet, or hands, sudden weight gain, unusual weakness or fatigue Increase in blood pressure Shortness of breath or trouble breathing, cough, unusual weakness or fatigue, blue skin or lips Side effects that usually do not require medical attention (report these to your care team if they continue or are bothersome): Flushing Headache Nausea This list may not describe all possible side effects. Call your doctor for medical advice about side effects. You may report side effects to FDA at 1-800-FDA-1088. Where should I keep my medication? This medication is given in a hospital or clinic. It will not be stored at home. NOTE: This sheet is a summary. It may not cover all possible information. If you have questions about this medicine, talk to your doctor, pharmacist, or health care provider.  2024 Elsevier/Gold Standard (2023-05-19 00:00:00)

## 2024-03-21 NOTE — Telephone Encounter (Signed)
 Patients wife called to report-she reviewed labs done at cancer center  With cardiac amyloid wanted to confirm if its ok to start calcium  replacement and or any supplements  Please advise

## 2024-03-22 ENCOUNTER — Ambulatory Visit (INDEPENDENT_AMBULATORY_CARE_PROVIDER_SITE_OTHER): Admitting: Urology

## 2024-03-22 VITALS — BP 84/54 | HR 82 | Ht 72.0 in | Wt 288.0 lb

## 2024-03-22 DIAGNOSIS — N401 Enlarged prostate with lower urinary tract symptoms: Secondary | ICD-10-CM

## 2024-03-22 DIAGNOSIS — N200 Calculus of kidney: Secondary | ICD-10-CM

## 2024-03-22 LAB — MICROSCOPIC EXAMINATION

## 2024-03-22 LAB — URINALYSIS, COMPLETE
Bilirubin, UA: NEGATIVE
Glucose, UA: NEGATIVE
Ketones, UA: NEGATIVE
Leukocytes,UA: NEGATIVE
Nitrite, UA: NEGATIVE
Specific Gravity, UA: 1.02 (ref 1.005–1.030)
Urobilinogen, Ur: 0.2 mg/dL (ref 0.2–1.0)
pH, UA: 6 (ref 5.0–7.5)

## 2024-03-22 MED ORDER — FINASTERIDE 5 MG PO TABS: 5.0000 mg | ORAL_TABLET | Freq: Every day | ORAL | 3 refills | Status: AC
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
## 2024-03-25 ENCOUNTER — Other Ambulatory Visit: Payer: Self-pay | Admitting: Pharmacy Technician

## 2024-03-25 ENCOUNTER — Inpatient Hospital Stay

## 2024-03-25 ENCOUNTER — Other Ambulatory Visit: Payer: Self-pay

## 2024-03-25 ENCOUNTER — Encounter: Payer: Self-pay | Admitting: Hematology

## 2024-03-25 ENCOUNTER — Ambulatory Visit: Payer: Self-pay | Admitting: Urology

## 2024-03-25 ENCOUNTER — Other Ambulatory Visit: Payer: Self-pay | Admitting: *Deleted

## 2024-03-25 VITALS — BP 114/63 | HR 72 | Temp 97.7°F | Resp 18

## 2024-03-25 DIAGNOSIS — R3129 Other microscopic hematuria: Secondary | ICD-10-CM

## 2024-03-25 DIAGNOSIS — Z5112 Encounter for antineoplastic immunotherapy: Secondary | ICD-10-CM | POA: Diagnosis not present

## 2024-03-25 DIAGNOSIS — I959 Hypotension, unspecified: Secondary | ICD-10-CM

## 2024-03-25 MED ORDER — ALBUMIN HUMAN 25 % IV SOLN
25.0000 g | Freq: Once | INTRAVENOUS | Status: AC
  Administered 2024-03-25: 25 g via INTRAVENOUS
  Filled 2024-03-25: qty 100

## 2024-03-25 NOTE — Patient Instructions (Signed)
 Albumin  Injection What is this medication? ALBUMIN  (al BYOO min) treats low blood volume, which may occur with severe dehydration or bleeding. It works by increasing blood volume so your heart can pump blood to the rest of your body. It may also be used to treat low albumin  levels caused by surgery, infection, or other health conditions. Albumin  is a protein that helps your body balance the level of fluid in your blood vessels. This helps maintain a healthy blood pressure and prevents swelling or edema. This medicine may be used for other purposes; ask your health care provider or pharmacist if you have questions. COMMON BRAND NAME(S): Albuked , Albumarc, Albuminar, Albuminex , AlbuRx , Albutein , Buminate, Flexbumin , Kedbumin , Macrotec, Plasbumin , Plasbumin -20 What should I tell my care team before I take this medication? They need to know if you have any of these conditions: Heart disease Kidney disease Low red blood cell levels (anemia) An unusual or allergic reaction to albumin , other medications, foods, dyes, or preservatives Pregnant or trying to get pregnant Breastfeeding How should I use this medication? This medication is infused into a vein. It is given by your care team in a hospital or clinic setting. Talk to your care team about the use of this medication in children. While it may be given to children for selected conditions, precautions do apply. Overdosage: If you think you have taken too much of this medicine contact a poison control center or emergency room at once. NOTE: This medicine is only for you. Do not share this medicine with others. What if I miss a dose? This does not apply. What may interact with this medication? Interactions are not expected. This list may not describe all possible interactions. Give your health care provider a list of all the medicines, herbs, non-prescription drugs, or dietary supplements you use. Also tell them if you smoke, drink alcohol, or use  illegal drugs. Some items may interact with your medicine. What should I watch for while using this medication? Your condition will be monitored carefully while you are receiving this medication. This product is derived from human plasma. Talk to your care team about the risks and benefits of this medication. What side effects may I notice from receiving this medication? Side effects that you should report to your care team as soon as possible: Allergic reactions--skin rash, itching, hives, swelling of the face, lips, tongue, or throat Heart failure--shortness of breath, swelling of the ankles, feet, or hands, sudden weight gain, unusual weakness or fatigue Increase in blood pressure Shortness of breath or trouble breathing, cough, unusual weakness or fatigue, blue skin or lips Side effects that usually do not require medical attention (report these to your care team if they continue or are bothersome): Flushing Headache Nausea This list may not describe all possible side effects. Call your doctor for medical advice about side effects. You may report side effects to FDA at 1-800-FDA-1088. Where should I keep my medication? This medication is given in a hospital or clinic. It will not be stored at home. NOTE: This sheet is a summary. It may not cover all possible information. If you have questions about this medicine, talk to your doctor, pharmacist, or health care provider.  2024 Elsevier/Gold Standard (2023-05-19 00:00:00)

## 2024-03-25 NOTE — Progress Notes (Signed)
 Specialty Pharmacy Refill Coordination Note  Joseph [name]. is a 58 y.o. male contacted today regarding refills of specialty medication(s) Droxidopa  (NORTHERA )  Spoke with Wife  Patient requested Delivery   Delivery date: 04/03/24   Verified address: [address]  [name]   Medication will be filled on 04/02/24.

## 2024-03-26 ENCOUNTER — Encounter: Payer: Self-pay | Admitting: Urology

## 2024-03-26 DIAGNOSIS — N400 Enlarged prostate without lower urinary tract symptoms: Secondary | ICD-10-CM

## 2024-03-26 DIAGNOSIS — E559 Vitamin D deficiency, unspecified: Secondary | ICD-10-CM | POA: Diagnosis not present

## 2024-03-26 DIAGNOSIS — E039 Hypothyroidism, unspecified: Secondary | ICD-10-CM | POA: Diagnosis not present

## 2024-03-26 DIAGNOSIS — R7989 Other specified abnormal findings of blood chemistry: Secondary | ICD-10-CM | POA: Diagnosis not present

## 2024-03-26 DIAGNOSIS — K760 Fatty (change of) liver, not elsewhere classified: Secondary | ICD-10-CM | POA: Diagnosis not present

## 2024-03-26 DIAGNOSIS — E7849 Other hyperlipidemia: Secondary | ICD-10-CM | POA: Diagnosis not present

## 2024-03-27 ENCOUNTER — Other Ambulatory Visit: Payer: Self-pay

## 2024-03-28 ENCOUNTER — Inpatient Hospital Stay

## 2024-03-28 VITALS — BP 112/74 | HR 72 | Temp 97.8°F | Resp 18

## 2024-03-28 DIAGNOSIS — Z5112 Encounter for antineoplastic immunotherapy: Secondary | ICD-10-CM | POA: Diagnosis not present

## 2024-03-28 DIAGNOSIS — I959 Hypotension, unspecified: Secondary | ICD-10-CM

## 2024-03-28 MED ORDER — ALBUMIN HUMAN 25 % IV SOLN
25.0000 g | Freq: Once | INTRAVENOUS | Status: AC
  Administered 2024-03-28: 25 g via INTRAVENOUS
  Filled 2024-03-28: qty 100

## 2024-03-28 MED ORDER — SODIUM CHLORIDE 0.9 % IV SOLN: INTRAVENOUS | Status: DC

## 2024-03-28 NOTE — Patient Instructions (Signed)
 Albumin  Injection What is this medication? ALBUMIN  (al BYOO min) treats low blood volume, which may occur with severe dehydration or bleeding. It works by increasing blood volume so your heart can pump blood to the rest of your body. It may also be used to treat low albumin  levels caused by surgery, infection, or other health conditions. Albumin  is a protein that helps your body balance the level of fluid in your blood vessels. This helps maintain a healthy blood pressure and prevents swelling or edema. This medicine may be used for other purposes; ask your health care provider or pharmacist if you have questions. COMMON BRAND NAME(S): Albuked , Albumarc, Albuminar, Albuminex , AlbuRx , Albutein , Buminate, Flexbumin , Kedbumin , Macrotec, Plasbumin , Plasbumin -20 What should I tell my care team before I take this medication? They need to know if you have any of these conditions: Heart disease Kidney disease Low red blood cell levels (anemia) An unusual or allergic reaction to albumin , other medications, foods, dyes, or preservatives Pregnant or trying to get pregnant Breastfeeding How should I use this medication? This medication is infused into a vein. It is given by your care team in a hospital or clinic setting. Talk to your care team about the use of this medication in children. While it may be given to children for selected conditions, precautions do apply. Overdosage: If you think you have taken too much of this medicine contact a poison control center or emergency room at once. NOTE: This medicine is only for you. Do not share this medicine with others. What if I miss a dose? This does not apply. What may interact with this medication? Interactions are not expected. This list may not describe all possible interactions. Give your health care provider a list of all the medicines, herbs, non-prescription drugs, or dietary supplements you use. Also tell them if you smoke, drink alcohol, or use  illegal drugs. Some items may interact with your medicine. What should I watch for while using this medication? Your condition will be monitored carefully while you are receiving this medication. This product is derived from human plasma. Talk to your care team about the risks and benefits of this medication. What side effects may I notice from receiving this medication? Side effects that you should report to your care team as soon as possible: Allergic reactions--skin rash, itching, hives, swelling of the face, lips, tongue, or throat Heart failure--shortness of breath, swelling of the ankles, feet, or hands, sudden weight gain, unusual weakness or fatigue Increase in blood pressure Shortness of breath or trouble breathing, cough, unusual weakness or fatigue, blue skin or lips Side effects that usually do not require medical attention (report these to your care team if they continue or are bothersome): Flushing Headache Nausea This list may not describe all possible side effects. Call your doctor for medical advice about side effects. You may report side effects to FDA at 1-800-FDA-1088. Where should I keep my medication? This medication is given in a hospital or clinic. It will not be stored at home. NOTE: This sheet is a summary. It may not cover all possible information. If you have questions about this medicine, talk to your doctor, pharmacist, or health care provider.  2024 Elsevier/Gold Standard (2023-05-19 00:00:00)

## 2024-04-01 ENCOUNTER — Other Ambulatory Visit: Payer: Self-pay | Admitting: Hematology

## 2024-04-01 ENCOUNTER — Inpatient Hospital Stay

## 2024-04-01 VITALS — BP 116/72 | HR 60 | Temp 97.7°F | Resp 16

## 2024-04-01 DIAGNOSIS — E1122 Type 2 diabetes mellitus with diabetic chronic kidney disease: Secondary | ICD-10-CM | POA: Diagnosis not present

## 2024-04-01 DIAGNOSIS — E8581 Light chain (AL) amyloidosis: Secondary | ICD-10-CM

## 2024-04-01 DIAGNOSIS — C9000 Multiple myeloma not having achieved remission: Secondary | ICD-10-CM

## 2024-04-01 DIAGNOSIS — E039 Hypothyroidism, unspecified: Secondary | ICD-10-CM | POA: Diagnosis not present

## 2024-04-01 DIAGNOSIS — Z23 Encounter for immunization: Secondary | ICD-10-CM | POA: Diagnosis not present

## 2024-04-01 DIAGNOSIS — E859 Amyloidosis, unspecified: Secondary | ICD-10-CM | POA: Diagnosis not present

## 2024-04-01 DIAGNOSIS — Z5112 Encounter for antineoplastic immunotherapy: Secondary | ICD-10-CM | POA: Diagnosis not present

## 2024-04-01 DIAGNOSIS — K760 Fatty (change of) liver, not elsewhere classified: Secondary | ICD-10-CM | POA: Diagnosis not present

## 2024-04-01 DIAGNOSIS — I959 Hypotension, unspecified: Secondary | ICD-10-CM

## 2024-04-01 LAB — CBC WITH DIFFERENTIAL (CANCER CENTER ONLY)
Abs Immature Granulocytes: 0.02 K/uL (ref 0.00–0.07)
Basophils Absolute: 0 K/uL (ref 0.0–0.1)
Basophils Relative: 0 %
Eosinophils Absolute: 0.1 K/uL (ref 0.0–0.5)
Eosinophils Relative: 1 %
HCT: 39 % (ref 39.0–52.0)
Hemoglobin: 13.1 g/dL (ref 13.0–17.0)
Immature Granulocytes: 0 %
Lymphocytes Relative: 26 %
Lymphs Abs: 2.5 K/uL (ref 0.7–4.0)
MCH: 32.2 pg (ref 26.0–34.0)
MCHC: 33.6 g/dL (ref 30.0–36.0)
MCV: 95.8 fL (ref 80.0–100.0)
Monocytes Absolute: 0.8 K/uL (ref 0.1–1.0)
Monocytes Relative: 8 %
Neutro Abs: 6.2 K/uL (ref 1.7–7.7)
Neutrophils Relative %: 65 %
Platelet Count: 204 K/uL (ref 150–400)
RBC: 4.07 MIL/uL — ABNORMAL LOW (ref 4.22–5.81)
RDW: 13.7 % (ref 11.5–15.5)
WBC Count: 9.6 K/uL (ref 4.0–10.5)
nRBC: 0 % (ref 0.0–0.2)

## 2024-04-01 LAB — CMP (CANCER CENTER ONLY)
ALT: 35 U/L (ref 0–44)
AST: 31 U/L (ref 15–41)
Albumin: 1.8 g/dL — ABNORMAL LOW (ref 3.5–5.0)
Alkaline Phosphatase: 94 U/L (ref 38–126)
Anion gap: 3 — ABNORMAL LOW (ref 5–15)
BUN: 22 mg/dL — ABNORMAL HIGH (ref 6–20)
CO2: 34 mmol/L — ABNORMAL HIGH (ref 22–32)
Calcium: 7.6 mg/dL — ABNORMAL LOW (ref 8.9–10.3)
Chloride: 105 mmol/L (ref 98–111)
Creatinine: 0.6 mg/dL — ABNORMAL LOW (ref 0.61–1.24)
GFR, Estimated: >60 mL/min (ref >60)
Glucose, Bld: 94 mg/dL (ref 70–99)
Potassium: 3.9 mmol/L (ref 3.5–5.1)
Sodium: 142 mmol/L (ref 135–145)
Total Bilirubin: 0.2 mg/dL (ref 0.0–1.2)
Total Protein: 3.9 g/dL — ABNORMAL LOW (ref 6.5–8.1)

## 2024-04-01 MED ORDER — DIPHENHYDRAMINE HCL 25 MG PO CAPS
50.0000 mg | ORAL_CAPSULE | Freq: Once | ORAL | Status: AC
  Administered 2024-04-01: 50 mg via ORAL
  Filled 2024-04-01: qty 2

## 2024-04-01 MED ORDER — ACETAMINOPHEN 325 MG PO TABS
650.0000 mg | ORAL_TABLET | Freq: Once | ORAL | Status: AC
  Administered 2024-04-01: 650 mg via ORAL
  Filled 2024-04-01: qty 2

## 2024-04-01 MED ORDER — DEXAMETHASONE 4 MG PO TABS
8.0000 mg | ORAL_TABLET | Freq: Once | ORAL | Status: AC
  Administered 2024-04-01: 8 mg via ORAL
  Filled 2024-04-01: qty 2

## 2024-04-01 MED ORDER — ALBUMIN HUMAN 25 % IV SOLN
25.0000 g | Freq: Once | INTRAVENOUS | Status: AC
  Administered 2024-04-01: 25 g via INTRAVENOUS
  Filled 2024-04-01: qty 100

## 2024-04-01 MED ORDER — DARATUMUMAB-HYALURONIDASE-FIHJ 1800-30000 MG-UT/15ML ~~LOC~~ SOLN
1800.0000 mg | Freq: Once | SUBCUTANEOUS | Status: AC
  Administered 2024-04-01: 1800 mg via SUBCUTANEOUS
  Filled 2024-04-01: qty 15

## 2024-04-01 NOTE — Progress Notes (Signed)
 Proceed w/ [id number] Darzalex  today. Monthly tx will begin w/ C7 per Dr Onesimo.  Jeric Slagel, Pharm.D., CPP 04/01/2024@2 :31 PM

## 2024-04-02 ENCOUNTER — Encounter: Payer: Self-pay | Admitting: Hematology

## 2024-04-02 ENCOUNTER — Other Ambulatory Visit: Payer: Self-pay

## 2024-04-03 ENCOUNTER — Other Ambulatory Visit: Payer: Self-pay

## 2024-04-03 ENCOUNTER — Telehealth: Payer: Self-pay

## 2024-04-03 NOTE — Telephone Encounter (Signed)
 Patients wife called in and wanted to let us  know that she got to looking at the Droxidopa  that he takes for his AL actually has side effects of causing blood in urine and UTI like symptoms. She wanted to know if this could be a causing factoring in what he has been dealing with lately and what he's being worked up for. She is going to contact his cardiologist and see if this medication can be backed down on a bit and substituted with more midadren. Pts wife just wanted to make us  aware and that they still feel like doing the CT scan this week and having this cysto next month are appropriate.

## 2024-04-04 ENCOUNTER — Inpatient Hospital Stay

## 2024-04-04 VITALS — BP 107/70 | HR 70 | Temp 98.1°F | Resp 16

## 2024-04-04 DIAGNOSIS — I959 Hypotension, unspecified: Secondary | ICD-10-CM

## 2024-04-04 DIAGNOSIS — Z5112 Encounter for antineoplastic immunotherapy: Secondary | ICD-10-CM | POA: Diagnosis not present

## 2024-04-04 MED ORDER — ALBUMIN HUMAN 25 % IV SOLN
25.0000 g | Freq: Once | INTRAVENOUS | Status: AC
  Administered 2024-04-04: 25 g via INTRAVENOUS
  Filled 2024-04-04: qty 100

## 2024-04-04 MED ORDER — SODIUM CHLORIDE 0.9 % IV SOLN: INTRAVENOUS | Status: DC

## 2024-04-04 MED ORDER — SILODOSIN 8 MG PO CAPS: 8.0000 mg | ORAL_CAPSULE | Freq: Every day | ORAL | 11 refills | Status: DC

## 2024-04-04 NOTE — Patient Instructions (Signed)
 Albumin  Injection What is this medication? ALBUMIN  (al BYOO min) treats low blood volume, which may occur with severe dehydration or bleeding. It works by increasing blood volume so your heart can pump blood to the rest of your body. It may also be used to treat low albumin  levels caused by surgery, infection, or other health conditions. Albumin  is a protein that helps your body balance the level of fluid in your blood vessels. This helps maintain a healthy blood pressure and prevents swelling or edema. This medicine may be used for other purposes; ask your health care provider or pharmacist if you have questions. COMMON BRAND NAME(S): Albuked , Albumarc, Albuminar, Albuminex , AlbuRx , Albutein , Buminate, Flexbumin , Kedbumin , Macrotec, Plasbumin , Plasbumin -20 What should I tell my care team before I take this medication? They need to know if you have any of these conditions: Heart disease Kidney disease Low red blood cell levels (anemia) An unusual or allergic reaction to albumin , other medications, foods, dyes, or preservatives Pregnant or trying to get pregnant Breastfeeding How should I use this medication? This medication is infused into a vein. It is given by your care team in a hospital or clinic setting. Talk to your care team about the use of this medication in children. While it may be given to children for selected conditions, precautions do apply. Overdosage: If you think you have taken too much of this medicine contact a poison control center or emergency room at once. NOTE: This medicine is only for you. Do not share this medicine with others. What if I miss a dose? This does not apply. What may interact with this medication? Interactions are not expected. This list may not describe all possible interactions. Give your health care provider a list of all the medicines, herbs, non-prescription drugs, or dietary supplements you use. Also tell them if you smoke, drink alcohol, or use  illegal drugs. Some items may interact with your medicine. What should I watch for while using this medication? Your condition will be monitored carefully while you are receiving this medication. This product is derived from human plasma. Talk to your care team about the risks and benefits of this medication. What side effects may I notice from receiving this medication? Side effects that you should report to your care team as soon as possible: Allergic reactions--skin rash, itching, hives, swelling of the face, lips, tongue, or throat Heart failure--shortness of breath, swelling of the ankles, feet, or hands, sudden weight gain, unusual weakness or fatigue Increase in blood pressure Shortness of breath or trouble breathing, cough, unusual weakness or fatigue, blue skin or lips Side effects that usually do not require medical attention (report these to your care team if they continue or are bothersome): Flushing Headache Nausea This list may not describe all possible side effects. Call your doctor for medical advice about side effects. You may report side effects to FDA at 1-800-FDA-1088. Where should I keep my medication? This medication is given in a hospital or clinic. It will not be stored at home. NOTE: This sheet is a summary. It may not cover all possible information. If you have questions about this medicine, talk to your doctor, pharmacist, or health care provider.  2024 Elsevier/Gold Standard (2023-05-19 00:00:00)

## 2024-04-05 ENCOUNTER — Ambulatory Visit
Admission: RE | Admit: 2024-04-05 | Discharge: 2024-04-05 | Disposition: A | Discharge status: Home / Self Care | Source: Ambulatory Visit | Attending: Urology | Admitting: Urology

## 2024-04-05 ENCOUNTER — Telehealth (HOSPITAL_COMMUNITY): Payer: Self-pay | Admitting: Cardiology

## 2024-04-05 DIAGNOSIS — R3129 Other microscopic hematuria: Secondary | ICD-10-CM | POA: Diagnosis not present

## 2024-04-05 DIAGNOSIS — K439 Ventral hernia without obstruction or gangrene: Secondary | ICD-10-CM | POA: Diagnosis not present

## 2024-04-05 DIAGNOSIS — N202 Calculus of kidney with calculus of ureter: Secondary | ICD-10-CM | POA: Diagnosis not present

## 2024-04-05 MED ADMIN — Iohexol Inj 300 MG/ML: 100 mL | INTRAVENOUS | NDC 00407141363

## 2024-04-05 NOTE — Telephone Encounter (Signed)
 Patients wife called with medication question  Reports patient has been dealing with hematuria x months Apart of work up includes CT and cystoscopy scheduled in November  Wife reports she has done research and found bloody urine to be a side effect of droxidopa    Wife questioned if we could increase midodrine  (in dosage or amount of times daily) and stop droxidopa     Please advise

## 2024-04-05 NOTE — Telephone Encounter (Signed)
Pt aware via wife 

## 2024-04-08 ENCOUNTER — Inpatient Hospital Stay

## 2024-04-08 ENCOUNTER — Ambulatory Visit: Payer: Self-pay | Admitting: Urology

## 2024-04-08 ENCOUNTER — Encounter (HOSPITAL_COMMUNITY): Admitting: Cardiology

## 2024-04-08 VITALS — BP 107/71 | HR 65 | Resp 16

## 2024-04-08 DIAGNOSIS — N201 Calculus of ureter: Secondary | ICD-10-CM

## 2024-04-08 DIAGNOSIS — I959 Hypotension, unspecified: Secondary | ICD-10-CM

## 2024-04-08 DIAGNOSIS — Z5112 Encounter for antineoplastic immunotherapy: Secondary | ICD-10-CM | POA: Diagnosis not present

## 2024-04-08 MED ORDER — ALBUMIN HUMAN 25 % IV SOLN
25.0000 g | Freq: Once | INTRAVENOUS | Status: AC
  Administered 2024-04-08: 25 g via INTRAVENOUS
  Filled 2024-04-08: qty 100

## 2024-04-08 NOTE — Patient Instructions (Signed)
 Albumin  Injection What is this medication? ALBUMIN  (al BYOO min) treats low blood volume, which may occur with severe dehydration or bleeding. It works by increasing blood volume so your heart can pump blood to the rest of your body. It may also be used to treat low albumin  levels caused by surgery, infection, or other health conditions. Albumin  is a protein that helps your body balance the level of fluid in your blood vessels. This helps maintain a healthy blood pressure and prevents swelling or edema. This medicine may be used for other purposes; ask your health care provider or pharmacist if you have questions. COMMON BRAND NAME(S): Albuked , Albumarc, Albuminar, Albuminex , AlbuRx , Albutein , Buminate, Flexbumin , Kedbumin , Macrotec, Plasbumin , Plasbumin -20 What should I tell my care team before I take this medication? They need to know if you have any of these conditions: Heart disease Kidney disease Low red blood cell levels (anemia) An unusual or allergic reaction to albumin , other medications, foods, dyes, or preservatives Pregnant or trying to get pregnant Breastfeeding How should I use this medication? This medication is infused into a vein. It is given by your care team in a hospital or clinic setting. Talk to your care team about the use of this medication in children. While it may be given to children for selected conditions, precautions do apply. Overdosage: If you think you have taken too much of this medicine contact a poison control center or emergency room at once. NOTE: This medicine is only for you. Do not share this medicine with others. What if I miss a dose? This does not apply. What may interact with this medication? Interactions are not expected. This list may not describe all possible interactions. Give your health care provider a list of all the medicines, herbs, non-prescription drugs, or dietary supplements you use. Also tell them if you smoke, drink alcohol, or use  illegal drugs. Some items may interact with your medicine. What should I watch for while using this medication? Your condition will be monitored carefully while you are receiving this medication. This product is derived from human plasma. Talk to your care team about the risks and benefits of this medication. What side effects may I notice from receiving this medication? Side effects that you should report to your care team as soon as possible: Allergic reactions--skin rash, itching, hives, swelling of the face, lips, tongue, or throat Heart failure--shortness of breath, swelling of the ankles, feet, or hands, sudden weight gain, unusual weakness or fatigue Increase in blood pressure Shortness of breath or trouble breathing, cough, unusual weakness or fatigue, blue skin or lips Side effects that usually do not require medical attention (report these to your care team if they continue or are bothersome): Flushing Headache Nausea This list may not describe all possible side effects. Call your doctor for medical advice about side effects. You may report side effects to FDA at 1-800-FDA-1088. Where should I keep my medication? This medication is given in a hospital or clinic. It will not be stored at home. NOTE: This sheet is a summary. It may not cover all possible information. If you have questions about this medicine, talk to your doctor, pharmacist, or health care provider.  2024 Elsevier/Gold Standard (2023-05-19 00:00:00)

## 2024-04-16 ENCOUNTER — Inpatient Hospital Stay

## 2024-04-16 ENCOUNTER — Inpatient Hospital Stay (HOSPITAL_BASED_OUTPATIENT_CLINIC_OR_DEPARTMENT_OTHER): Admitting: Hematology

## 2024-04-16 VITALS — BP 85/55 | HR 73 | Temp 97.3°F | Resp 20 | Wt 290.9 lb

## 2024-04-16 VITALS — BP 120/86 | HR 76 | Resp 20

## 2024-04-16 DIAGNOSIS — Z5112 Encounter for antineoplastic immunotherapy: Secondary | ICD-10-CM | POA: Diagnosis not present

## 2024-04-16 DIAGNOSIS — C9000 Multiple myeloma not having achieved remission: Secondary | ICD-10-CM

## 2024-04-16 DIAGNOSIS — E8581 Light chain (AL) amyloidosis: Secondary | ICD-10-CM

## 2024-04-16 DIAGNOSIS — I959 Hypotension, unspecified: Secondary | ICD-10-CM

## 2024-04-16 DIAGNOSIS — Z5111 Encounter for antineoplastic chemotherapy: Secondary | ICD-10-CM

## 2024-04-16 LAB — CMP (CANCER CENTER ONLY)
ALT: 54 U/L — ABNORMAL HIGH (ref 0–44)
AST: 55 U/L — ABNORMAL HIGH (ref 15–41)
Albumin: 1.5 g/dL — ABNORMAL LOW (ref 3.5–5.0)
Alkaline Phosphatase: 85 U/L (ref 38–126)
Anion gap: 6 (ref 5–15)
BUN: 20 mg/dL (ref 6–20)
CO2: 31 mmol/L (ref 22–32)
Calcium: 7.3 mg/dL — ABNORMAL LOW (ref 8.9–10.3)
Chloride: 105 mmol/L (ref 98–111)
Creatinine: 0.61 mg/dL (ref 0.61–1.24)
GFR, Estimated: >60 mL/min (ref >60)
Glucose, Bld: 99 mg/dL (ref 70–99)
Potassium: 3.9 mmol/L (ref 3.5–5.1)
Sodium: 142 mmol/L (ref 135–145)
Total Bilirubin: 0.2 mg/dL (ref 0.0–1.2)
Total Protein: 3.3 g/dL — ABNORMAL LOW (ref 6.5–8.1)

## 2024-04-16 LAB — CBC WITH DIFFERENTIAL (CANCER CENTER ONLY)
Abs Immature Granulocytes: 0.02 K/uL (ref 0.00–0.07)
Basophils Absolute: 0 K/uL (ref 0.0–0.1)
Basophils Relative: 0 %
Eosinophils Absolute: 0.1 K/uL (ref 0.0–0.5)
Eosinophils Relative: 1 %
HCT: 40.1 % (ref 39.0–52.0)
Hemoglobin: 13.2 g/dL (ref 13.0–17.0)
Immature Granulocytes: 0 %
Lymphocytes Relative: 26 %
Lymphs Abs: 2.2 K/uL (ref 0.7–4.0)
MCH: 31.7 pg (ref 26.0–34.0)
MCHC: 32.9 g/dL (ref 30.0–36.0)
MCV: 96.2 fL (ref 80.0–100.0)
Monocytes Absolute: 0.7 K/uL (ref 0.1–1.0)
Monocytes Relative: 8 %
Neutro Abs: 5.5 K/uL (ref 1.7–7.7)
Neutrophils Relative %: 65 %
Platelet Count: 185 K/uL (ref 150–400)
RBC: 4.17 MIL/uL — ABNORMAL LOW (ref 4.22–5.81)
RDW: 14.1 % (ref 11.5–15.5)
WBC Count: 8.5 K/uL (ref 4.0–10.5)
nRBC: 0 % (ref 0.0–0.2)

## 2024-04-16 MED ORDER — ALBUMIN HUMAN 25 % IV SOLN
25.0000 g | Freq: Once | INTRAVENOUS | Status: AC
  Administered 2024-04-16: 25 g via INTRAVENOUS
  Filled 2024-04-16: qty 100

## 2024-04-16 MED ORDER — ACETAMINOPHEN 325 MG PO TABS
650.0000 mg | ORAL_TABLET | Freq: Once | ORAL | Status: AC
  Administered 2024-04-16: 650 mg via ORAL
  Filled 2024-04-16: qty 2

## 2024-04-16 MED ORDER — DARATUMUMAB-HYALURONIDASE-FIHJ 1800-30000 MG-UT/15ML ~~LOC~~ SOLN
1800.0000 mg | Freq: Once | SUBCUTANEOUS | Status: AC
  Administered 2024-04-16: 1800 mg via SUBCUTANEOUS
  Filled 2024-04-16: qty 15

## 2024-04-16 MED ORDER — DEXAMETHASONE 6 MG PO TABS
20.0000 mg | ORAL_TABLET | Freq: Once | ORAL | Status: AC
  Administered 2024-04-16: 20 mg via ORAL
  Filled 2024-04-16: qty 2

## 2024-04-16 MED ORDER — DIPHENHYDRAMINE HCL 25 MG PO CAPS
50.0000 mg | ORAL_CAPSULE | Freq: Once | ORAL | Status: AC
  Administered 2024-04-16: 50 mg via ORAL
  Filled 2024-04-16: qty 2

## 2024-04-16 NOTE — Patient Instructions (Signed)
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
## 2024-04-17 ENCOUNTER — Ambulatory Visit (HOSPITAL_COMMUNITY)
Admission: RE | Admit: 2024-04-17 | Discharge: 2024-04-17 | Disposition: A | Discharge status: Home / Self Care | Source: Ambulatory Visit | Attending: Cardiology | Admitting: Cardiology

## 2024-04-17 ENCOUNTER — Encounter (HOSPITAL_COMMUNITY): Payer: Self-pay | Admitting: Cardiology

## 2024-04-17 VITALS — BP 98/62 | HR 113 | Ht 72.0 in | Wt 296.8 lb

## 2024-04-17 DIAGNOSIS — Z951 Presence of aortocoronary bypass graft: Secondary | ICD-10-CM | POA: Insufficient documentation

## 2024-04-17 DIAGNOSIS — Z8673 Personal history of transient ischemic attack (TIA), and cerebral infarction without residual deficits: Secondary | ICD-10-CM | POA: Diagnosis not present

## 2024-04-17 DIAGNOSIS — N049 Nephrotic syndrome with unspecified morphologic changes: Secondary | ICD-10-CM | POA: Insufficient documentation

## 2024-04-17 DIAGNOSIS — R600 Localized edema: Secondary | ICD-10-CM | POA: Diagnosis not present

## 2024-04-17 DIAGNOSIS — I43 Cardiomyopathy in diseases classified elsewhere: Secondary | ICD-10-CM | POA: Insufficient documentation

## 2024-04-17 DIAGNOSIS — E854 Organ-limited amyloidosis: Secondary | ICD-10-CM | POA: Diagnosis not present

## 2024-04-17 DIAGNOSIS — Z79899 Other long term (current) drug therapy: Secondary | ICD-10-CM | POA: Diagnosis not present

## 2024-04-17 DIAGNOSIS — I951 Orthostatic hypotension: Secondary | ICD-10-CM | POA: Diagnosis not present

## 2024-04-17 DIAGNOSIS — Z7902 Long term (current) use of antithrombotics/antiplatelets: Secondary | ICD-10-CM | POA: Diagnosis not present

## 2024-04-17 DIAGNOSIS — I5030 Unspecified diastolic (congestive) heart failure: Secondary | ICD-10-CM | POA: Insufficient documentation

## 2024-04-17 DIAGNOSIS — I5032 Chronic diastolic (congestive) heart failure: Secondary | ICD-10-CM | POA: Diagnosis present

## 2024-04-17 DIAGNOSIS — I2510 Atherosclerotic heart disease of native coronary artery without angina pectoris: Secondary | ICD-10-CM | POA: Diagnosis not present

## 2024-04-17 DIAGNOSIS — Z955 Presence of coronary angioplasty implant and graft: Secondary | ICD-10-CM | POA: Diagnosis not present

## 2024-04-17 LAB — KAPPA/LAMBDA LIGHT CHAINS
Kappa free light chain: 13.6 mg/L (ref 3.3–19.4)
Kappa, lambda light chain ratio: 0.71 (ref 0.26–1.65)
Lambda free light chains: 19.2 mg/L (ref 5.7–26.3)

## 2024-04-17 NOTE — Progress Notes (Signed)
 ADVANCED HEART FAILURE FOLLOW UP CLINIC NOTE  Referring Physician: [name], MD  Primary Care: [name], MD Primary Cardiologist:  HPI: Joseph [name]. is a 58 y.o. male who presents for follow up of HFpEF and AL amyloid.      Patient has a fairly complex medical history.  Notable history of coronary artery disease with PCI to the LAD in 2015, moderate RCA stenosis at the time.  He established care with [facility] cardiology in 2023 for HCM and was trialed on mavacamten for worsening obstructive symptoms.  he reported that the medication interfere with his pain medication and so he was referred to [facility] clinic for septal myectomy.  Procedure was complicated by postoperative atrial fibrillation, likely TIA/CVA, but otherwise good result with improvement in gradient.  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  Mass spectroscopy was not able to identify the dominant amyloid protein.   Since that time he was admitted in December for CVA and was placed on aspirin /Plavix .  Additionally, he was admitted in March for symptomatic hypotension.  All blood pressure medications were held with improvement.  He was also found to have new nephrotic syndrome and follow-up was established with nephrology.   Since that time he had a bone marrow biopsy on 09/28/23 that showed small foci of amyloid deposits and 10% plasma cell burden.  Patient was started on Dara-CyBorD, though planning to hold Velcade given neuropathy.  Admission for volume overload, given hypotension felt the need for inpatient observation with IV diuresis.   Re-admitted 7/25 with diarrhea, he had self decreased torsemide  dose due to GI symptoms, and ultimately stopped diuretic. Received supportive care, electrolytes repleted and given IV albumin . Discharged home, weight 276 lbs.   Overall has had excellent response to therapy.  Currently in  remission, maintenance therapy ongoing.      SUBJECTIVE:  Looks and feels the best I have seen since he was diagnosed.  He reports that he has been moving around more, finds that he has more energy, and is less tired.  If still significantly limited by orthostatic hypotension and has been receiving albumin  infusions twice a week.  We discussed his recent weight gain as well as blisters on his lower extremities.  Hopeful that as his synthetic function improves we may be able to increase his diuretics, discussed increasing the dosing especially after albumin  days.  PMH, current medications, allergies, social history, and family history reviewed in epic.  PHYSICAL EXAM: Vitals:   04/17/24 1353  BP: 98/62  Pulse: (!) 113  SpO2: 92%     GENERAL: Pale, improved appearance from prior PULM: Normal work of breathing CARDIAC:  JVP: Mildly elevated         Regular rate with regular rhythm. No murmurs, rubs or gallops.  2+ lower extremity edema, small areas of skin breakdown ABDOMEN: Soft, non-tender, non-distended. NEUROLOGIC: Patient is oriented x3 with no focal or lateralizing neurologic deficits.    DATA REVIEW  ECG: 10/03/2022: NSR, LBBB    ECHO: 08/22/23: S/p septal reduction therapy, grade I DD, LVH  11/29/2023: LVEF 50 to 55%, moderate LVH, RV size is normal  CATH: 07/2022: moderate proximal RCA stenosis, patent LAD and Lcx with mid ISR of mid LAD stent. 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   Main symptom continues to be debilitating orthostatic hypotension as well as volume overload, largely due to poor oncotic pressure.  Based on his most recent urinalysis, his proteinuria and  nephrotic syndrome may be slowly improving, discussed potential increase in diuretic dosing, especially after he receives IV albumin . -As his blood pressure improves may be able to wean off the droxidopa , discussed this at length - Continue droxidopa  100mg  TID - Continue midodrine  20mg  TID, occasional fourth dose - Torsemide  20mg  daily, discussed increasing to 30/40 mg especially after albumin  days - Continue follow up with oncology   CAD s/p single vessel CABG: Prior LAD PCI. - Continue plavix  alone   CVA: No evidence of atrial fibrillation.  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 - Follow up with nephrology  Follow-up in 3 months  [name], MD Advanced Heart Failure Mechanical Circulatory Support 04/21/24

## 2024-04-17 NOTE — Patient Instructions (Signed)
 There has been no changes to your medications.  Your physician recommends that you schedule a follow-up appointment in: 3 months ( January 2026) ** PLEASE CALL THE OFFICE IN DECEMBER TO ARRANGE YOUR FOLLOW UP APPOINTMENT.**  If you have any questions or concerns before your next appointment please send us  a message through [place] or call our office at [phone].    TO LEAVE A MESSAGE FOR THE NURSE SELECT OPTION 2, PLEASE LEAVE A MESSAGE INCLUDING: YOUR NAME DATE OF BIRTH CALL BACK NUMBER REASON FOR CALL**this is important as we prioritize the call backs  YOU WILL RECEIVE A CALL BACK THE SAME DAY AS LONG AS YOU CALL BEFORE 4:00 PM  At the [facility], you and your health needs are our priority. As part of our continuing mission to provide you with exceptional heart care, we have created designated Provider Care Teams. These Care Teams include your primary Cardiologist (physician) and Advanced Practice Providers (APPs- Physician Assistants and Nurse Practitioners) who all work together to provide you with the care you need, when you need it.   You may see any of the following providers on your designated Care Team at your next follow up: Dr Toribio Fuel Dr Ezra Shuck Dr. [name] Greig Mosses, NP Caffie Shed, [place] [facility] [place], [place] Beckey Coe, NP Jordan Lee, NP Ellouise Class, NP Tinnie Redman, PharmD Jaun Bash, PharmD   Please be sure to bring in all your medications bottles to every appointment.    Thank you for choosing [facility]

## 2024-04-18 ENCOUNTER — Ambulatory Visit

## 2024-04-18 ENCOUNTER — Encounter (HOSPITAL_BASED_OUTPATIENT_CLINIC_OR_DEPARTMENT_OTHER): Attending: General Surgery | Admitting: General Surgery

## 2024-04-18 DIAGNOSIS — E8581 Light chain (AL) amyloidosis: Secondary | ICD-10-CM | POA: Insufficient documentation

## 2024-04-18 DIAGNOSIS — L97829 Non-pressure chronic ulcer of other part of left lower leg with unspecified severity: Secondary | ICD-10-CM | POA: Diagnosis not present

## 2024-04-18 DIAGNOSIS — I890 Lymphedema, not elsewhere classified: Secondary | ICD-10-CM | POA: Diagnosis not present

## 2024-04-18 DIAGNOSIS — I509 Heart failure, unspecified: Secondary | ICD-10-CM | POA: Insufficient documentation

## 2024-04-18 DIAGNOSIS — Z87441 Personal history of nephrotic syndrome: Secondary | ICD-10-CM | POA: Diagnosis not present

## 2024-04-18 DIAGNOSIS — L97819 Non-pressure chronic ulcer of other part of right lower leg with unspecified severity: Secondary | ICD-10-CM | POA: Diagnosis present

## 2024-04-18 DIAGNOSIS — J9691 Respiratory failure, unspecified with hypoxia: Secondary | ICD-10-CM | POA: Insufficient documentation

## 2024-04-19 ENCOUNTER — Inpatient Hospital Stay

## 2024-04-19 ENCOUNTER — Ambulatory Visit

## 2024-04-19 ENCOUNTER — Encounter (HOSPITAL_BASED_OUTPATIENT_CLINIC_OR_DEPARTMENT_OTHER): Admitting: General Surgery

## 2024-04-19 VITALS — BP 114/77 | HR 66 | Resp 18

## 2024-04-19 DIAGNOSIS — Z5112 Encounter for antineoplastic immunotherapy: Secondary | ICD-10-CM | POA: Diagnosis not present

## 2024-04-19 DIAGNOSIS — L97819 Non-pressure chronic ulcer of other part of right lower leg with unspecified severity: Secondary | ICD-10-CM | POA: Diagnosis not present

## 2024-04-19 DIAGNOSIS — I959 Hypotension, unspecified: Secondary | ICD-10-CM

## 2024-04-19 LAB — MULTIPLE MYELOMA PANEL, SERUM
Albumin SerPl Elph-Mcnc: 1.2 g/dL — ABNORMAL LOW (ref 2.9–4.4)
Albumin/Glob SerPl: 0.6 — ABNORMAL LOW (ref 0.7–1.7)
Alpha 1: 0.1 g/dL (ref 0.0–0.4)
Alpha2 Glob SerPl Elph-Mcnc: 1.3 g/dL — ABNORMAL HIGH (ref 0.4–1.0)
B-Globulin SerPl Elph-Mcnc: 0.6 g/dL — ABNORMAL LOW (ref 0.7–1.3)
Gamma Glob SerPl Elph-Mcnc: 0.1 g/dL — ABNORMAL LOW (ref 0.4–1.8)
Globulin, Total: 2.1 g/dL — ABNORMAL LOW (ref 2.2–3.9)
IgA: 64 mg/dL — ABNORMAL LOW (ref 90–386)
IgG (Immunoglobin G), Serum: 193 mg/dL — ABNORMAL LOW (ref 603–1613)
IgM (Immunoglobulin M), Srm: 60 mg/dL (ref 20–172)
Total Protein ELP: 3.3 g/dL — ABNORMAL LOW (ref 6.0–8.5)

## 2024-04-19 MED ORDER — ALBUMIN HUMAN 25 % IV SOLN
25.0000 g | Freq: Once | INTRAVENOUS | Status: AC
  Administered 2024-04-19: 25 g via INTRAVENOUS
  Filled 2024-04-19: qty 100

## 2024-04-19 NOTE — Patient Instructions (Signed)
 Albumin  Injection What is this medication? ALBUMIN  (al BYOO min) treats low blood volume, which may occur with severe dehydration or bleeding. It works by increasing blood volume so your heart can pump blood to the rest of your body. It may also be used to treat low albumin  levels caused by surgery, infection, or other health conditions. Albumin  is a protein that helps your body balance the level of fluid in your blood vessels. This helps maintain a healthy blood pressure and prevents swelling or edema. This medicine may be used for other purposes; ask your health care provider or pharmacist if you have questions. COMMON BRAND NAME(S): Albuked , Albumarc, Albuminar, Albuminex , AlbuRx , Albutein , Buminate, Flexbumin , Kedbumin , Macrotec, Plasbumin , Plasbumin -20 What should I tell my care team before I take this medication? They need to know if you have any of these conditions: Heart disease Kidney disease Low red blood cell levels (anemia) An unusual or allergic reaction to albumin , other medications, foods, dyes, or preservatives Pregnant or trying to get pregnant Breastfeeding How should I use this medication? This medication is infused into a vein. It is given by your care team in a hospital or clinic setting. Talk to your care team about the use of this medication in children. While it may be given to children for selected conditions, precautions do apply. Overdosage: If you think you have taken too much of this medicine contact a poison control center or emergency room at once. NOTE: This medicine is only for you. Do not share this medicine with others. What if I miss a dose? This does not apply. What may interact with this medication? Interactions are not expected. This list may not describe all possible interactions. Give your health care provider a list of all the medicines, herbs, non-prescription drugs, or dietary supplements you use. Also tell them if you smoke, drink alcohol, or use  illegal drugs. Some items may interact with your medicine. What should I watch for while using this medication? Your condition will be monitored carefully while you are receiving this medication. This product is derived from human plasma. Talk to your care team about the risks and benefits of this medication. What side effects may I notice from receiving this medication? Side effects that you should report to your care team as soon as possible: Allergic reactions--skin rash, itching, hives, swelling of the face, lips, tongue, or throat Heart failure--shortness of breath, swelling of the ankles, feet, or hands, sudden weight gain, unusual weakness or fatigue Increase in blood pressure Shortness of breath or trouble breathing, cough, unusual weakness or fatigue, blue skin or lips Side effects that usually do not require medical attention (report these to your care team if they continue or are bothersome): Flushing Headache Nausea This list may not describe all possible side effects. Call your doctor for medical advice about side effects. You may report side effects to FDA at 1-800-FDA-1088. Where should I keep my medication? This medication is given in a hospital or clinic. It will not be stored at home. NOTE: This sheet is a summary. It may not cover all possible information. If you have questions about this medicine, talk to your doctor, pharmacist, or health care provider.  2024 Elsevier/Gold Standard (2023-05-19 00:00:00)

## 2024-04-22 ENCOUNTER — Inpatient Hospital Stay
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
## 2024-04-24 ENCOUNTER — Encounter: Payer: Self-pay | Admitting: Podiatry

## 2024-04-24 ENCOUNTER — Ambulatory Visit (INDEPENDENT_AMBULATORY_CARE_PROVIDER_SITE_OTHER): Admitting: Podiatry

## 2024-04-24 ENCOUNTER — Inpatient Hospital Stay: Attending: Internal Medicine

## 2024-04-24 VITALS — BP 142/78 | HR 77 | Temp 97.9°F | Resp 16

## 2024-04-24 DIAGNOSIS — Z5112 Encounter for antineoplastic immunotherapy: Secondary | ICD-10-CM | POA: Insufficient documentation

## 2024-04-24 DIAGNOSIS — D689 Coagulation defect, unspecified: Secondary | ICD-10-CM

## 2024-04-24 DIAGNOSIS — E8809 Other disorders of plasma-protein metabolism, not elsewhere classified: Secondary | ICD-10-CM

## 2024-04-24 DIAGNOSIS — M79675 Pain in left toe(s): Secondary | ICD-10-CM | POA: Diagnosis not present

## 2024-04-24 DIAGNOSIS — E8581 Light chain (AL) amyloidosis: Secondary | ICD-10-CM | POA: Insufficient documentation

## 2024-04-24 DIAGNOSIS — M79674 Pain in right toe(s): Secondary | ICD-10-CM

## 2024-04-24 DIAGNOSIS — B351 Tinea unguium: Secondary | ICD-10-CM

## 2024-04-24 DIAGNOSIS — I959 Hypotension, unspecified: Secondary | ICD-10-CM

## 2024-04-24 MED ORDER — ALBUMIN HUMAN 25 % IV SOLN
25.0000 g | Freq: Once | INTRAVENOUS | Status: AC
  Administered 2024-04-24: 25 g via INTRAVENOUS
  Filled 2024-04-24: qty 100

## 2024-04-24 MED ORDER — SODIUM CHLORIDE 0.9 % IV SOLN: INTRAVENOUS | Status: DC

## 2024-04-24 NOTE — Progress Notes (Signed)
 Albumin  infusion  given per orders. Patient tolerated it well without problems. Vitals stable and discharged home from clinic ambulatory. Follow up as scheduled.

## 2024-04-24 NOTE — Patient Instructions (Signed)
 [facility] - A DEPT OF [facility]  Discharge Instructions: Thank you for choosing [facility] to provide your oncology and hematology care.   If you have a lab appointment with the Cancer Center, please go directly to the Cancer Center and check in at the registration area.   Wear comfortable clothing and clothing appropriate for easy access to any Portacath or PICC line.   We strive to give you quality time with your provider. You may need to reschedule your appointment if you arrive late (15 or more minutes).  Arriving late affects you and other patients whose appointments are after yours.  Also, if you miss three or more appointments without notifying the office, you may be dismissed from the clinic at the provider's discretion.      For prescription refill requests, have your pharmacy contact our office and allow 72 hours for refills to be completed.    Today you received the following albumin  infusion today     To help prevent nausea and vomiting after your treatment, we encourage you to take your nausea medication as directed.  BELOW ARE SYMPTOMS THAT SHOULD BE REPORTED IMMEDIATELY: *FEVER GREATER THAN 100.4 F (38 C) OR HIGHER *CHILLS OR SWEATING *NAUSEA AND VOMITING THAT IS NOT CONTROLLED WITH YOUR NAUSEA MEDICATION *UNUSUAL SHORTNESS OF BREATH *UNUSUAL BRUISING OR BLEEDING *URINARY PROBLEMS (pain or burning when urinating, or frequent urination) *BOWEL PROBLEMS (unusual diarrhea, constipation, pain near the anus) TENDERNESS IN MOUTH AND THROAT WITH OR WITHOUT PRESENCE OF ULCERS (sore throat, sores in mouth, or a toothache) UNUSUAL RASH, SWELLING OR PAIN  UNUSUAL VAGINAL DISCHARGE OR ITCHING   Items with * indicate a potential emergency and should be followed up as soon as possible or go to the Emergency Department if any problems should occur.  Please show the CHEMOTHERAPY ALERT CARD or IMMUNOTHERAPY ALERT CARD at check-in to the  Emergency Department and triage nurse.  Should you have questions after your visit or need to cancel or reschedule your appointment, please contact [facility] - [facility]  Dept: [phone]  and follow the prompts.  Office hours are 8:00 a.m. to 4:30 p.m. Monday - Friday. Please note that voicemails left after 4:00 p.m. may not be returned until the following business day.  We are closed weekends and major holidays. You have access to a nurse at all times for urgent questions. Please call the main number to the clinic Dept: [phone] and follow the prompts.   For any non-urgent questions, you may also contact your provider using MyChart. We now offer e-Visits for anyone 67 and older to request care online for non-urgent symptoms. For details visit [email or web address].   Also download the MyChart app! Go to the app store, search MyChart, open the app, select [place], and log in with your MyChart username and password.

## 2024-04-24 NOTE — Progress Notes (Signed)
 This patient presents to the office with chief complaint of long thick painful nails.  Patient says the nails are painful walking and wearing shoes.  This patient is unable to self treat.  This patient is unable to trim her nails since he is unable to reach her nails.   Patient has history of TIA for which he takes plavix . he presents to the office for preventative foot care services.  General Appearance  Alert, conversant and in no acute stress.  Vascular  Dorsalis pedis and posterior tibial  pulses are  non palpable  due to swelling  bilaterally.  Capillary return is within normal limits  bilaterally. Temperature is within normal limits  bilaterally.  Neurologic  Senn-Weinstein monofilament wire test within normal limits  bilaterally. Muscle power within normal limits bilaterally.  Nails Thick disfigured discolored nails with subungual debris  from hallux to fifth toes bilaterally. No evidence of bacterial infection or drainage bilaterally.  Orthopedic  No limitations of motion  feet .  No crepitus or effusions noted.  No bony pathology or digital deformities noted.  Skin  normotropic skin with no porokeratosis noted bilaterally.  No signs of infections or ulcers noted.     Onychomycosis  Nails  B/L.  Pain in right toes  Pain in left toes  Debridement of nails both feet followed trimming the nails with dremel tool.    RTC 10 weeks    Cordella Bold DPM

## 2024-04-25 ENCOUNTER — Encounter (HOSPITAL_BASED_OUTPATIENT_CLINIC_OR_DEPARTMENT_OTHER): Attending: General Surgery | Admitting: General Surgery

## 2024-04-25 ENCOUNTER — Other Ambulatory Visit: Payer: Self-pay

## 2024-04-25 DIAGNOSIS — I890 Lymphedema, not elsewhere classified: Secondary | ICD-10-CM | POA: Diagnosis not present

## 2024-04-25 DIAGNOSIS — E8581 Light chain (AL) amyloidosis: Secondary | ICD-10-CM | POA: Diagnosis not present

## 2024-04-25 DIAGNOSIS — J9691 Respiratory failure, unspecified with hypoxia: Secondary | ICD-10-CM | POA: Insufficient documentation

## 2024-04-25 DIAGNOSIS — I509 Heart failure, unspecified: Secondary | ICD-10-CM | POA: Insufficient documentation

## 2024-04-25 DIAGNOSIS — Z87441 Personal history of nephrotic syndrome: Secondary | ICD-10-CM | POA: Insufficient documentation

## 2024-04-25 DIAGNOSIS — L97829 Non-pressure chronic ulcer of other part of left lower leg with unspecified severity: Secondary | ICD-10-CM | POA: Insufficient documentation

## 2024-04-28 ENCOUNTER — Encounter: Payer: Self-pay | Admitting: Hematology

## 2024-04-29 ENCOUNTER — Other Ambulatory Visit: Payer: Self-pay

## 2024-04-30 ENCOUNTER — Inpatient Hospital Stay

## 2024-04-30 VITALS — BP 96/58 | HR 68 | Temp 97.8°F | Resp 16 | Wt 287.5 lb

## 2024-04-30 DIAGNOSIS — Z5112 Encounter for antineoplastic immunotherapy: Secondary | ICD-10-CM | POA: Diagnosis not present

## 2024-04-30 DIAGNOSIS — I959 Hypotension, unspecified: Secondary | ICD-10-CM

## 2024-04-30 MED ORDER — ALBUMIN HUMAN 25 % IV SOLN
25.0000 g | Freq: Once | INTRAVENOUS | Status: AC
  Administered 2024-04-30: 25 g via INTRAVENOUS
  Filled 2024-04-30: qty 100

## 2024-04-30 NOTE — Patient Instructions (Signed)
 Albumin  Injection What is this medication? ALBUMIN  (al BYOO min) treats low blood volume, which may occur with severe dehydration or bleeding. It works by increasing blood volume so your heart can pump blood to the rest of your body. It may also be used to treat low albumin  levels caused by surgery, infection, or other health conditions. Albumin  is a protein that helps your body balance the level of fluid in your blood vessels. This helps maintain a healthy blood pressure and prevents swelling or edema. This medicine may be used for other purposes; ask your health care provider or pharmacist if you have questions. COMMON BRAND NAME(S): Albuked , Albumarc, Albuminar, Albuminex , AlbuRx , Albutein , Buminate, Flexbumin , Kedbumin , Macrotec, Plasbumin , Plasbumin -20 What should I tell my care team before I take this medication? They need to know if you have any of these conditions: Heart disease Kidney disease Low red blood cell levels (anemia) An unusual or allergic reaction to albumin , other medications, foods, dyes, or preservatives Pregnant or trying to get pregnant Breastfeeding How should I use this medication? This medication is infused into a vein. It is given by your care team in a hospital or clinic setting. Talk to your care team about the use of this medication in children. While it may be given to children for selected conditions, precautions do apply. Overdosage: If you think you have taken too much of this medicine contact a poison control center or emergency room at once. NOTE: This medicine is only for you. Do not share this medicine with others. What if I miss a dose? This does not apply. What may interact with this medication? Interactions are not expected. This list may not describe all possible interactions. Give your health care provider a list of all the medicines, herbs, non-prescription drugs, or dietary supplements you use. Also tell them if you smoke, drink alcohol, or use  illegal drugs. Some items may interact with your medicine. What should I watch for while using this medication? Your condition will be monitored carefully while you are receiving this medication. This product is derived from human plasma. Talk to your care team about the risks and benefits of this medication. What side effects may I notice from receiving this medication? Side effects that you should report to your care team as soon as possible: Allergic reactions--skin rash, itching, hives, swelling of the face, lips, tongue, or throat Heart failure--shortness of breath, swelling of the ankles, feet, or hands, sudden weight gain, unusual weakness or fatigue Increase in blood pressure Shortness of breath or trouble breathing, cough, unusual weakness or fatigue, blue skin or lips Side effects that usually do not require medical attention (report these to your care team if they continue or are bothersome): Flushing Headache Nausea This list may not describe all possible side effects. Call your doctor for medical advice about side effects. You may report side effects to FDA at 1-800-FDA-1088. Where should I keep my medication? This medication is given in a hospital or clinic. It will not be stored at home. NOTE: This sheet is a summary. It may not cover all possible information. If you have questions about this medicine, talk to your doctor, pharmacist, or health care provider.  2024 Elsevier/Gold Standard (2023-05-19 00:00:00)

## 2024-05-01 ENCOUNTER — Encounter (HOSPITAL_BASED_OUTPATIENT_CLINIC_OR_DEPARTMENT_OTHER): Admitting: General Surgery

## 2024-05-01 DIAGNOSIS — L97829 Non-pressure chronic ulcer of other part of left lower leg with unspecified severity: Secondary | ICD-10-CM | POA: Diagnosis not present

## 2024-05-02 ENCOUNTER — Other Ambulatory Visit: Payer: Self-pay | Admitting: Physician Assistant

## 2024-05-06 ENCOUNTER — Inpatient Hospital Stay

## 2024-05-06 VITALS — BP 114/75 | HR 66 | Temp 97.6°F | Resp 18 | Ht 72.0 in | Wt 267.5 lb

## 2024-05-06 DIAGNOSIS — I959 Hypotension, unspecified: Secondary | ICD-10-CM

## 2024-05-06 DIAGNOSIS — Z5112 Encounter for antineoplastic immunotherapy: Secondary | ICD-10-CM | POA: Diagnosis not present

## 2024-05-06 MED ORDER — ALBUMIN HUMAN 25 % IV SOLN
25.0000 g | Freq: Once | INTRAVENOUS | Status: AC
  Administered 2024-05-06: 25 g via INTRAVENOUS
  Filled 2024-05-06: qty 100

## 2024-05-06 NOTE — Patient Instructions (Signed)
 Albumin  Injection What is this medication? ALBUMIN  (al BYOO min) treats low blood volume, which may occur with severe dehydration or bleeding. It works by increasing blood volume so your heart can pump blood to the rest of your body. It may also be used to treat low albumin  levels caused by surgery, infection, or other health conditions. Albumin  is a protein that helps your body balance the level of fluid in your blood vessels. This helps maintain a healthy blood pressure and prevents swelling or edema. This medicine may be used for other purposes; ask your health care provider or pharmacist if you have questions. COMMON BRAND NAME(S): Albuked , Albumarc, Albuminar, Albuminex , AlbuRx , Albutein , Buminate, Flexbumin , Kedbumin , Macrotec, Plasbumin , Plasbumin -20 What should I tell my care team before I take this medication? They need to know if you have any of these conditions: Heart disease Kidney disease Low red blood cell levels (anemia) An unusual or allergic reaction to albumin , other medications, foods, dyes, or preservatives Pregnant or trying to get pregnant Breastfeeding How should I use this medication? This medication is infused into a vein. It is given by your care team in a hospital or clinic setting. Talk to your care team about the use of this medication in children. While it may be given to children for selected conditions, precautions do apply. Overdosage: If you think you have taken too much of this medicine contact a poison control center or emergency room at once. NOTE: This medicine is only for you. Do not share this medicine with others. What if I miss a dose? This does not apply. What may interact with this medication? Interactions are not expected. This list may not describe all possible interactions. Give your health care provider a list of all the medicines, herbs, non-prescription drugs, or dietary supplements you use. Also tell them if you smoke, drink alcohol, or use  illegal drugs. Some items may interact with your medicine. What should I watch for while using this medication? Your condition will be monitored carefully while you are receiving this medication. This product is derived from human plasma. Talk to your care team about the risks and benefits of this medication. What side effects may I notice from receiving this medication? Side effects that you should report to your care team as soon as possible: Allergic reactions--skin rash, itching, hives, swelling of the face, lips, tongue, or throat Heart failure--shortness of breath, swelling of the ankles, feet, or hands, sudden weight gain, unusual weakness or fatigue Increase in blood pressure Shortness of breath or trouble breathing, cough, unusual weakness or fatigue, blue skin or lips Side effects that usually do not require medical attention (report these to your care team if they continue or are bothersome): Flushing Headache Nausea This list may not describe all possible side effects. Call your doctor for medical advice about side effects. You may report side effects to FDA at 1-800-FDA-1088. Where should I keep my medication? This medication is given in a hospital or clinic. It will not be stored at home. NOTE: This sheet is a summary. It may not cover all possible information. If you have questions about this medicine, talk to your doctor, pharmacist, or health care provider.  2024 Elsevier/Gold Standard (2023-05-19 00:00:00)

## 2024-05-07 ENCOUNTER — Encounter: Payer: Self-pay | Admitting: Hematology

## 2024-05-07 ENCOUNTER — Inpatient Hospital Stay

## 2024-05-07 ENCOUNTER — Ambulatory Visit: Admitting: Urology

## 2024-05-07 ENCOUNTER — Ambulatory Visit
Admission: RE | Admit: 2024-05-07 | Discharge: 2024-05-07 | Disposition: A | Discharge status: Home / Self Care | Source: Ambulatory Visit | Attending: Urology | Admitting: Urology

## 2024-05-07 VITALS — BP 103/66 | HR 70 | Ht 72.0 in | Wt 275.0 lb

## 2024-05-07 DIAGNOSIS — R3129 Other microscopic hematuria: Secondary | ICD-10-CM

## 2024-05-07 DIAGNOSIS — N200 Calculus of kidney: Secondary | ICD-10-CM

## 2024-05-07 LAB — URINALYSIS, COMPLETE
Bilirubin, UA: NEGATIVE
Glucose, UA: NEGATIVE
Ketones, UA: NEGATIVE
Leukocytes,UA: NEGATIVE
Nitrite, UA: NEGATIVE
Specific Gravity, UA: 1.025 (ref 1.005–1.030)
Urobilinogen, Ur: 1 mg/dL (ref 0.2–1.0)
pH, UA: 6 (ref 5.0–7.5)

## 2024-05-07 LAB — MICROSCOPIC EXAMINATION

## 2024-05-07 NOTE — Progress Notes (Signed)
   05/07/24  CC:  Chief Complaint  Patient presents with   Cysto    HPI: Refer to my prior note 03/22/2024.  His daughter was with him today CT urogram did show bilateral nonobstructing renal calculi and a 6 mm left distal ureteral calculus which was not obstructing.  UA today 3-10 RBC  Blood pressure 103/66, pulse 70, height 6' (1.829 m), weight 275 lb (124.7 kg). NED. A&Ox3.   No respiratory distress   Abd soft, NT, ND Normal phallus with bilateral descended testicles  Cystoscopy Procedure Note  Patient identification was confirmed, informed consent was obtained, and patient was prepped using Betadine solution.  Lidocaine  jelly was administered per urethral meatus.     Pre-Procedure: - Inspection reveals a normal caliber urethral meatus.  Procedure: The flexible cystoscope was introduced without difficulty - No urethral strictures/lesions are present. - Prominent lateral lobe enlargement prostate  - Mild elevation bladder neck - Bilateral ureteral orifices identified - Bladder mucosa  reveals no ulcers, tumors, or lesions - No bladder stones - Moderate trabeculation  Retroflexion shows no tumor or intravesical median lobe   Post-Procedure: - Patient tolerated the procedure well  Assessment/ Plan: BPH with prominent lateral lobe enlargement No bladder tumor or bladder mucosal abnormalities 6 mm nonobstructing left distal ureteral calculus.  Recommend KUB today.  If stone is visualized since nonobstructing can give a longer trial of passage.  We discussed nonobstructing ureteral calculi which are not treated can gradually enlarge and silently obstruct the kidney and would recommend treatment if he is unable to pass.  We discussed SWL and ureteroscopy If stone is not visualized on KUB will obtain a follow-up renal stone CT   Glendia JAYSON Barba, MD

## 2024-05-08 ENCOUNTER — Other Ambulatory Visit (HOSPITAL_COMMUNITY): Payer: Self-pay

## 2024-05-09 ENCOUNTER — Encounter (HOSPITAL_BASED_OUTPATIENT_CLINIC_OR_DEPARTMENT_OTHER): Admitting: General Surgery

## 2024-05-09 ENCOUNTER — Other Ambulatory Visit: Payer: Self-pay

## 2024-05-14 ENCOUNTER — Other Ambulatory Visit (HOSPITAL_COMMUNITY): Payer: Self-pay

## 2024-05-14 ENCOUNTER — Encounter: Payer: Self-pay | Admitting: Hematology

## 2024-05-14 ENCOUNTER — Other Ambulatory Visit: Payer: Self-pay

## 2024-05-14 ENCOUNTER — Encounter: Payer: Self-pay | Admitting: Urology

## 2024-05-14 ENCOUNTER — Inpatient Hospital Stay

## 2024-05-14 ENCOUNTER — Inpatient Hospital Stay: Admitting: Hematology

## 2024-05-14 ENCOUNTER — Encounter (HOSPITAL_BASED_OUTPATIENT_CLINIC_OR_DEPARTMENT_OTHER): Admitting: General Surgery

## 2024-05-14 VITALS — BP 122/78

## 2024-05-14 VITALS — BP 76/52 | HR 80 | Temp 97.2°F | Resp 18 | Wt 279.1 lb

## 2024-05-14 DIAGNOSIS — I959 Hypotension, unspecified: Secondary | ICD-10-CM

## 2024-05-14 DIAGNOSIS — E8581 Light chain (AL) amyloidosis: Secondary | ICD-10-CM | POA: Diagnosis not present

## 2024-05-14 DIAGNOSIS — Z5111 Encounter for antineoplastic chemotherapy: Secondary | ICD-10-CM | POA: Diagnosis not present

## 2024-05-14 DIAGNOSIS — C9000 Multiple myeloma not having achieved remission: Secondary | ICD-10-CM

## 2024-05-14 DIAGNOSIS — Z5112 Encounter for antineoplastic immunotherapy: Secondary | ICD-10-CM | POA: Diagnosis not present

## 2024-05-14 DIAGNOSIS — L97829 Non-pressure chronic ulcer of other part of left lower leg with unspecified severity: Secondary | ICD-10-CM | POA: Diagnosis not present

## 2024-05-14 LAB — CBC WITH DIFFERENTIAL (CANCER CENTER ONLY)
Abs Immature Granulocytes: 0.04 K/uL (ref 0.00–0.07)
Basophils Absolute: 0.1 K/uL (ref 0.0–0.1)
Basophils Relative: 1 %
Eosinophils Absolute: 0.1 K/uL (ref 0.0–0.5)
Eosinophils Relative: 1 %
HCT: 40.5 % (ref 39.0–52.0)
Hemoglobin: 13.1 g/dL (ref 13.0–17.0)
Immature Granulocytes: 0 %
Lymphocytes Relative: 19 %
Lymphs Abs: 2.5 K/uL (ref 0.7–4.0)
MCH: 30.8 pg (ref 26.0–34.0)
MCHC: 32.3 g/dL (ref 30.0–36.0)
MCV: 95.3 fL (ref 80.0–100.0)
Monocytes Absolute: 1 K/uL (ref 0.1–1.0)
Monocytes Relative: 8 %
Neutro Abs: 9.5 K/uL — ABNORMAL HIGH (ref 1.7–7.7)
Neutrophils Relative %: 71 %
Platelet Count: 257 K/uL (ref 150–400)
RBC: 4.25 MIL/uL (ref 4.22–5.81)
RDW: 14.1 % (ref 11.5–15.5)
WBC Count: 13.2 K/uL — ABNORMAL HIGH (ref 4.0–10.5)
nRBC: 0 % (ref 0.0–0.2)

## 2024-05-14 LAB — CMP (CANCER CENTER ONLY)
ALT: 35 U/L (ref 0–44)
AST: 50 U/L — ABNORMAL HIGH (ref 15–41)
Albumin: 1.7 g/dL — ABNORMAL LOW (ref 3.5–5.0)
Alkaline Phosphatase: 123 U/L (ref 38–126)
Anion gap: 6 (ref 5–15)
BUN: 15 mg/dL (ref 6–20)
CO2: 31 mmol/L (ref 22–32)
Calcium: 7.6 mg/dL — ABNORMAL LOW (ref 8.9–10.3)
Chloride: 105 mmol/L (ref 98–111)
Creatinine: 0.63 mg/dL (ref 0.61–1.24)
GFR, Estimated: >60 mL/min (ref >60)
Glucose, Bld: 113 mg/dL — ABNORMAL HIGH (ref 70–99)
Potassium: 4.1 mmol/L (ref 3.5–5.1)
Sodium: 142 mmol/L (ref 135–145)
Total Bilirubin: <0.2 mg/dL (ref 0.0–1.2)
Total Protein: 3.5 g/dL — ABNORMAL LOW (ref 6.5–8.1)

## 2024-05-14 MED ORDER — DEXAMETHASONE 6 MG PO TABS
20.0000 mg | ORAL_TABLET | Freq: Once | ORAL | Status: AC
  Administered 2024-05-14: 20 mg via ORAL
  Filled 2024-05-14: qty 2

## 2024-05-14 MED ORDER — ACETAMINOPHEN 325 MG PO TABS
650.0000 mg | ORAL_TABLET | Freq: Once | ORAL | Status: AC
  Administered 2024-05-14: 650 mg via ORAL
  Filled 2024-05-14: qty 2

## 2024-05-14 MED ORDER — ALBUMIN HUMAN 25 % IV SOLN
25.0000 g | Freq: Once | INTRAVENOUS | Status: AC
  Administered 2024-05-14: 25 g via INTRAVENOUS
  Filled 2024-05-14: qty 100

## 2024-05-14 MED ORDER — DIPHENHYDRAMINE HCL 25 MG PO CAPS
50.0000 mg | ORAL_CAPSULE | Freq: Once | ORAL | Status: AC
  Administered 2024-05-14: 50 mg via ORAL
  Filled 2024-05-14: qty 2

## 2024-05-14 MED ORDER — PHENYLEPHRINE IN HARD FAT 0.25 % RE SUPP
1.0000 | Freq: Two times a day (BID) | RECTAL | 0 refills | Status: AC
  Filled 2024-05-14 – 2024-05-21 (×2): qty 12, 6d supply, fill #0

## 2024-05-14 MED ORDER — DARATUMUMAB-HYALURONIDASE-FIHJ 1800-30000 MG-UT/15ML ~~LOC~~ SOLN
1800.0000 mg | Freq: Once | SUBCUTANEOUS | Status: AC
  Administered 2024-05-14: 1800 mg via SUBCUTANEOUS
  Filled 2024-05-14: qty 15

## 2024-05-14 NOTE — Patient Instructions (Signed)
 [facility] - A DEPT OF [place]  Discharge Instructions: Thank you for choosing [facility] to provide your oncology and hematology care.   If you have a lab appointment with the Cancer Center, please go directly to the Cancer Center and check in at the registration area.   Wear comfortable clothing and clothing appropriate for easy access to any Portacath or PICC line.   We strive to give you quality time with your provider. You may need to reschedule your appointment if you arrive late (15 or more minutes).  Arriving late affects you and other patients whose appointments are after yours.  Also, if you miss three or more appointments without notifying the office, you may be dismissed from the clinic at the provider's discretion.      For prescription refill requests, have your pharmacy contact our office and allow 72 hours for refills to be completed.    Today you received the following chemotherapy and/or immunotherapy agents: Daratumumab -hyaluronidase -fihj (Darzalex  faspro)    To help prevent nausea and vomiting after your treatment, we encourage you to take your nausea medication as directed.  BELOW ARE SYMPTOMS THAT SHOULD BE REPORTED IMMEDIATELY: *FEVER GREATER THAN 100.4 F (38 C) OR HIGHER *CHILLS OR SWEATING *NAUSEA AND VOMITING THAT IS NOT CONTROLLED WITH YOUR NAUSEA MEDICATION *UNUSUAL SHORTNESS OF BREATH *UNUSUAL BRUISING OR BLEEDING *URINARY PROBLEMS (pain or burning when urinating, or frequent urination) *BOWEL PROBLEMS (unusual diarrhea, constipation, pain near the anus) TENDERNESS IN MOUTH AND THROAT WITH OR WITHOUT PRESENCE OF ULCERS (sore throat, sores in mouth, or a toothache) UNUSUAL RASH, SWELLING OR PAIN  UNUSUAL VAGINAL DISCHARGE OR ITCHING   Items with * indicate a potential emergency and should be followed up as soon as possible or go to the Emergency Department if any problems should occur.  Please show the  CHEMOTHERAPY ALERT CARD or IMMUNOTHERAPY ALERT CARD at check-in to the Emergency Department and triage nurse.  Should you have questions after your visit or need to cancel or reschedule your appointment, please contact [facility]  Dept: [phone]  and follow the prompts.  Office hours are 8:00 a.m. to 4:30 p.m. Monday - Friday. Please note that voicemails left after 4:00 p.m. may not be returned until the following business day.  We are closed weekends and major holidays. You have access to a nurse at all times for urgent questions. Please call the main number to the clinic Dept: [phone] and follow the prompts.   For any non-urgent questions, you may also contact your provider using MyChart. We now offer e-Visits for anyone 52 and older to request care online for non-urgent symptoms. For details visit [email or web address].   Also download the MyChart app! Go to the app store, search MyChart, open the app, select [place], and log in with your MyChart username and password.  Albumin  Injection What is this medication? ALBUMIN  (al BYOO min) treats low blood volume, which may occur with severe dehydration or bleeding. It works by increasing blood volume so your heart can pump blood to the rest of your body. It may also be used to treat low albumin  levels caused by surgery, infection, or other health conditions. Albumin  is a protein that helps your body balance the level of fluid in your blood vessels. This helps maintain a healthy blood pressure and prevents swelling or edema. This medicine may be used  for other purposes; ask your health care provider or pharmacist if you have questions. COMMON BRAND NAME(S): Albuked , Albumarc, Albuminar, Albuminex , AlbuRx , Albutein , Buminate, Flexbumin , Kedbumin , Macrotec, Plasbumin , Plasbumin -20 What should I tell my care team before I take this medication? They need to know if you have any of  these conditions: Heart disease Kidney disease Low red blood cell levels (anemia) An unusual or allergic reaction to albumin , other medications, foods, dyes, or preservatives Pregnant or trying to get pregnant Breastfeeding How should I use this medication? This medication is infused into a vein. It is given by your care team in a hospital or clinic setting. Talk to your care team about the use of this medication in children. While it may be given to children for selected conditions, precautions do apply. Overdosage: If you think you have taken too much of this medicine contact a poison control center or emergency room at once. NOTE: This medicine is only for you. Do not share this medicine with others. What if I miss a dose? This does not apply. What may interact with this medication? Interactions are not expected. This list may not describe all possible interactions. Give your health care provider a list of all the medicines, herbs, non-prescription drugs, or dietary supplements you use. Also tell them if you smoke, drink alcohol, or use illegal drugs. Some items may interact with your medicine. What should I watch for while using this medication? Your condition will be monitored carefully while you are receiving this medication. This product is derived from human plasma. Talk to your care team about the risks and benefits of this medication. What side effects may I notice from receiving this medication? Side effects that you should report to your care team as soon as possible: Allergic reactions--skin rash, itching, hives, swelling of the face, lips, tongue, or throat Heart failure--shortness of breath, swelling of the ankles, feet, or hands, sudden weight gain, unusual weakness or fatigue Increase in blood pressure Shortness of breath or trouble breathing, cough, unusual weakness or fatigue, blue skin or lips Side effects that usually do not require medical attention (report these to  your care team if they continue or are bothersome): Flushing Headache Nausea This list may not describe all possible side effects. Call your doctor for medical advice about side effects. You may report side effects to FDA at 1-800-FDA-1088. Where should I keep my medication? This medication is given in a hospital or clinic. It will not be stored at home. NOTE: This sheet is a summary. It may not cover all possible information. If you have questions about this medicine, talk to your doctor, pharmacist, or health care provider.  2024 Elsevier/Gold Standard (2023-05-19 00:00:00)
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
## 2024-05-14 NOTE — Progress Notes (Signed)
 Specialty Pharmacy Refill Coordination Note  Joseph [name]. is a 58 y.o. male contacted today regarding refills of specialty medication(s) Droxidopa  (NORTHERA )   Patient requested Delivery   Pickup date: 05/21/24   Medication will be filled on: 05/20/24  Spoke with patient's wife, she requested pickup for this fill.

## 2024-05-15 ENCOUNTER — Other Ambulatory Visit (HOSPITAL_COMMUNITY): Payer: Self-pay

## 2024-05-17 ENCOUNTER — Other Ambulatory Visit (HOSPITAL_COMMUNITY): Payer: Self-pay

## 2024-05-20 ENCOUNTER — Other Ambulatory Visit (HOSPITAL_COMMUNITY): Payer: Self-pay

## 2024-05-20 ENCOUNTER — Encounter: Payer: Self-pay | Admitting: Hematology

## 2024-05-20 ENCOUNTER — Other Ambulatory Visit (HOSPITAL_BASED_OUTPATIENT_CLINIC_OR_DEPARTMENT_OTHER): Payer: Self-pay

## 2024-05-20 ENCOUNTER — Other Ambulatory Visit: Payer: Self-pay

## 2024-05-21 ENCOUNTER — Inpatient Hospital Stay: Attending: Internal Medicine

## 2024-05-21 ENCOUNTER — Other Ambulatory Visit: Payer: Self-pay

## 2024-05-21 ENCOUNTER — Other Ambulatory Visit (HOSPITAL_COMMUNITY): Payer: Self-pay

## 2024-05-21 VITALS — BP 122/77 | HR 81 | Temp 98.9°F | Resp 16

## 2024-05-21 DIAGNOSIS — E8581 Light chain (AL) amyloidosis: Secondary | ICD-10-CM

## 2024-05-21 DIAGNOSIS — E785 Hyperlipidemia, unspecified: Secondary | ICD-10-CM | POA: Diagnosis not present

## 2024-05-21 DIAGNOSIS — Z79899 Other long term (current) drug therapy: Secondary | ICD-10-CM | POA: Insufficient documentation

## 2024-05-21 DIAGNOSIS — E039 Hypothyroidism, unspecified: Secondary | ICD-10-CM | POA: Insufficient documentation

## 2024-05-21 DIAGNOSIS — Z7902 Long term (current) use of antithrombotics/antiplatelets: Secondary | ICD-10-CM | POA: Diagnosis not present

## 2024-05-21 DIAGNOSIS — C9000 Multiple myeloma not having achieved remission: Secondary | ICD-10-CM

## 2024-05-21 DIAGNOSIS — Z79624 Long term (current) use of inhibitors of nucleotide synthesis: Secondary | ICD-10-CM | POA: Diagnosis not present

## 2024-05-21 DIAGNOSIS — F418 Other specified anxiety disorders: Secondary | ICD-10-CM | POA: Insufficient documentation

## 2024-05-21 DIAGNOSIS — Z79891 Long term (current) use of opiate analgesic: Secondary | ICD-10-CM | POA: Diagnosis not present

## 2024-05-21 DIAGNOSIS — I10 Essential (primary) hypertension: Secondary | ICD-10-CM | POA: Insufficient documentation

## 2024-05-21 DIAGNOSIS — I959 Hypotension, unspecified: Secondary | ICD-10-CM

## 2024-05-21 DIAGNOSIS — I2510 Atherosclerotic heart disease of native coronary artery without angina pectoris: Secondary | ICD-10-CM | POA: Insufficient documentation

## 2024-05-21 DIAGNOSIS — F17290 Nicotine dependence, other tobacco product, uncomplicated: Secondary | ICD-10-CM | POA: Diagnosis not present

## 2024-05-21 DIAGNOSIS — Z5112 Encounter for antineoplastic immunotherapy: Secondary | ICD-10-CM | POA: Diagnosis present

## 2024-05-21 DIAGNOSIS — Z79890 Hormone replacement therapy (postmenopausal): Secondary | ICD-10-CM | POA: Insufficient documentation

## 2024-05-21 DIAGNOSIS — Z8051 Family history of malignant neoplasm of kidney: Secondary | ICD-10-CM | POA: Insufficient documentation

## 2024-05-21 MED ORDER — SODIUM CHLORIDE 0.9 % IV SOLN: Freq: Once | INTRAVENOUS | Status: DC

## 2024-05-21 MED ORDER — ALBUMIN HUMAN 25 % IV SOLN
25.0000 g | Freq: Once | INTRAVENOUS | Status: AC
  Administered 2024-05-21: 25 g via INTRAVENOUS
  Filled 2024-05-21: qty 100

## 2024-05-21 MED ORDER — ACYCLOVIR 400 MG PO TABS: 400.0000 mg | ORAL_TABLET | Freq: Two times a day (BID) | ORAL | 5 refills | Status: AC

## 2024-05-21 MED ORDER — SODIUM CHLORIDE 0.9 % IV SOLN: INTRAVENOUS | Status: DC

## 2024-05-21 MED ORDER — SODIUM CHLORIDE 0.9 % IV SOLN: Freq: Once | INTRAVENOUS | Status: AC

## 2024-05-21 NOTE — Patient Instructions (Signed)
 Albumin  Injection What is this medication? ALBUMIN  (al BYOO min) treats low blood volume, which may occur with severe dehydration or bleeding. It works by increasing blood volume so your heart can pump blood to the rest of your body. It may also be used to treat low albumin  levels caused by surgery, infection, or other health conditions. Albumin  is a protein that helps your body balance the level of fluid in your blood vessels. This helps maintain a healthy blood pressure and prevents swelling or edema. This medicine may be used for other purposes; ask your health care provider or pharmacist if you have questions. COMMON BRAND NAME(S): Albuked , Albumarc, Albuminar, Albuminex , AlbuRx , Albutein , Buminate, Flexbumin , Kedbumin , Macrotec, Plasbumin , Plasbumin -20 What should I tell my care team before I take this medication? They need to know if you have any of these conditions: Heart disease Kidney disease Low red blood cell levels (anemia) An unusual or allergic reaction to albumin , other medications, foods, dyes, or preservatives Pregnant or trying to get pregnant Breastfeeding How should I use this medication? This medication is infused into a vein. It is given by your care team in a hospital or clinic setting. Talk to your care team about the use of this medication in children. While it may be given to children for selected conditions, precautions do apply. Overdosage: If you think you have taken too much of this medicine contact a poison control center or emergency room at once. NOTE: This medicine is only for you. Do not share this medicine with others. What if I miss a dose? This does not apply. What may interact with this medication? Interactions are not expected. This list may not describe all possible interactions. Give your health care provider a list of all the medicines, herbs, non-prescription drugs, or dietary supplements you use. Also tell them if you smoke, drink alcohol, or use  illegal drugs. Some items may interact with your medicine. What should I watch for while using this medication? Your condition will be monitored carefully while you are receiving this medication. This product is derived from human plasma. Talk to your care team about the risks and benefits of this medication. What side effects may I notice from receiving this medication? Side effects that you should report to your care team as soon as possible: Allergic reactions--skin rash, itching, hives, swelling of the face, lips, tongue, or throat Heart failure--shortness of breath, swelling of the ankles, feet, or hands, sudden weight gain, unusual weakness or fatigue Increase in blood pressure Shortness of breath or trouble breathing, cough, unusual weakness or fatigue, blue skin or lips Side effects that usually do not require medical attention (report these to your care team if they continue or are bothersome): Flushing Headache Nausea This list may not describe all possible side effects. Call your doctor for medical advice about side effects. You may report side effects to FDA at 1-800-FDA-1088. Where should I keep my medication? This medication is given in a hospital or clinic. It will not be stored at home. NOTE: This sheet is a summary. It may not cover all possible information. If you have questions about this medicine, talk to your doctor, pharmacist, or health care provider.  2024 Elsevier/Gold Standard (2023-05-19 00:00:00)

## 2024-05-22 ENCOUNTER — Encounter: Payer: Self-pay | Admitting: Hematology

## 2024-05-28 ENCOUNTER — Inpatient Hospital Stay

## 2024-05-28 VITALS — BP 100/61 | HR 76 | Temp 97.7°F | Resp 17 | Ht 72.0 in | Wt 281.0 lb

## 2024-05-28 DIAGNOSIS — Z5112 Encounter for antineoplastic immunotherapy: Secondary | ICD-10-CM | POA: Diagnosis not present

## 2024-05-28 DIAGNOSIS — I959 Hypotension, unspecified: Secondary | ICD-10-CM

## 2024-05-28 MED ORDER — ALBUMIN HUMAN 25 % IV SOLN
25.0000 g | Freq: Once | INTRAVENOUS | Status: AC
  Administered 2024-05-28: 25 g via INTRAVENOUS
  Filled 2024-05-28: qty 50

## 2024-05-28 NOTE — Patient Instructions (Signed)
 Albumin  Injection What is this medication? ALBUMIN  (al BYOO min) treats low blood volume, which may occur with severe dehydration or bleeding. It works by increasing blood volume so your heart can pump blood to the rest of your body. It may also be used to treat low albumin  levels caused by surgery, infection, or other health conditions. Albumin  is a protein that helps your body balance the level of fluid in your blood vessels. This helps maintain a healthy blood pressure and prevents swelling or edema. This medicine may be used for other purposes; ask your health care provider or pharmacist if you have questions. COMMON BRAND NAME(S): Albuked , Albumarc, Albuminar, Albuminex , AlbuRx , Albutein , Buminate, Flexbumin , Kedbumin , Macrotec, Plasbumin , Plasbumin -20 What should I tell my care team before I take this medication? They need to know if you have any of these conditions: Heart disease Kidney disease Low red blood cell levels (anemia) An unusual or allergic reaction to albumin , other medications, foods, dyes, or preservatives Pregnant or trying to get pregnant Breastfeeding How should I use this medication? This medication is infused into a vein. It is given by your care team in a hospital or clinic setting. Talk to your care team about the use of this medication in children. While it may be given to children for selected conditions, precautions do apply. Overdosage: If you think you have taken too much of this medicine contact a poison control center or emergency room at once. NOTE: This medicine is only for you. Do not share this medicine with others. What if I miss a dose? This does not apply. What may interact with this medication? Interactions are not expected. This list may not describe all possible interactions. Give your health care provider a list of all the medicines, herbs, non-prescription drugs, or dietary supplements you use. Also tell them if you smoke, drink alcohol, or use  illegal drugs. Some items may interact with your medicine. What should I watch for while using this medication? Your condition will be monitored carefully while you are receiving this medication. This product is derived from human plasma. Talk to your care team about the risks and benefits of this medication. What side effects may I notice from receiving this medication? Side effects that you should report to your care team as soon as possible: Allergic reactions--skin rash, itching, hives, swelling of the face, lips, tongue, or throat Heart failure--shortness of breath, swelling of the ankles, feet, or hands, sudden weight gain, unusual weakness or fatigue Increase in blood pressure Shortness of breath or trouble breathing, cough, unusual weakness or fatigue, blue skin or lips Side effects that usually do not require medical attention (report these to your care team if they continue or are bothersome): Flushing Headache Nausea This list may not describe all possible side effects. Call your doctor for medical advice about side effects. You may report side effects to FDA at 1-800-FDA-1088. Where should I keep my medication? This medication is given in a hospital or clinic. It will not be stored at home. NOTE: This sheet is a summary. It may not cover all possible information. If you have questions about this medicine, talk to your doctor, pharmacist, or health care provider.  2024 Elsevier/Gold Standard (2023-05-19 00:00:00)

## 2024-06-03 ENCOUNTER — Telehealth: Payer: Self-pay | Admitting: Urology

## 2024-06-03 ENCOUNTER — Ambulatory Visit (HOSPITAL_COMMUNITY): Admission: RE | Admit: 2024-06-03 | Discharge: 2024-06-03 | Attending: Cardiology | Admitting: Cardiology

## 2024-06-03 ENCOUNTER — Telehealth: Payer: Self-pay | Admitting: *Deleted

## 2024-06-03 ENCOUNTER — Encounter (HOSPITAL_COMMUNITY): Payer: Self-pay | Admitting: Cardiology

## 2024-06-03 VITALS — BP 84/50 | HR 70 | Wt 276.2 lb

## 2024-06-03 DIAGNOSIS — I2510 Atherosclerotic heart disease of native coronary artery without angina pectoris: Secondary | ICD-10-CM | POA: Diagnosis not present

## 2024-06-03 DIAGNOSIS — N049 Nephrotic syndrome with unspecified morphologic changes: Secondary | ICD-10-CM | POA: Diagnosis not present

## 2024-06-03 DIAGNOSIS — E8581 Light chain (AL) amyloidosis: Secondary | ICD-10-CM | POA: Diagnosis present

## 2024-06-03 DIAGNOSIS — Z955 Presence of coronary angioplasty implant and graft: Secondary | ICD-10-CM | POA: Diagnosis not present

## 2024-06-03 DIAGNOSIS — I5032 Chronic diastolic (congestive) heart failure: Secondary | ICD-10-CM | POA: Diagnosis present

## 2024-06-03 DIAGNOSIS — Z7902 Long term (current) use of antithrombotics/antiplatelets: Secondary | ICD-10-CM | POA: Diagnosis not present

## 2024-06-03 DIAGNOSIS — Z79899 Other long term (current) drug therapy: Secondary | ICD-10-CM | POA: Diagnosis not present

## 2024-06-03 DIAGNOSIS — Z951 Presence of aortocoronary bypass graft: Secondary | ICD-10-CM | POA: Diagnosis not present

## 2024-06-03 DIAGNOSIS — Z7982 Long term (current) use of aspirin: Secondary | ICD-10-CM | POA: Diagnosis not present

## 2024-06-03 DIAGNOSIS — Z8673 Personal history of transient ischemic attack (TIA), and cerebral infarction without residual deficits: Secondary | ICD-10-CM | POA: Diagnosis not present

## 2024-06-03 DIAGNOSIS — I951 Orthostatic hypotension: Secondary | ICD-10-CM | POA: Diagnosis not present

## 2024-06-03 DIAGNOSIS — Z5112 Encounter for antineoplastic immunotherapy: Secondary | ICD-10-CM | POA: Diagnosis not present

## 2024-06-03 NOTE — Telephone Encounter (Signed)
 Notified patient as instructed, patient does not want to do the ct at this time. He states he is not having any more pain .

## 2024-06-03 NOTE — Telephone Encounter (Signed)
 Patient wants to know when he can try to stop the Alfuzosin  , He started on Finasteride  on 03/22/2024 .

## 2024-06-03 NOTE — Patient Instructions (Signed)
 There has been no changes to your medications.  Your physician recommends that you schedule a follow-up appointment in: 4 months ( April 2026) ** PLEASE CALL THE OFFICE IN FEBRUARY TO ARRANGE YOUR FOLLOW UP APPOINTMENT.**  If you have any questions or concerns before your next appointment please send us  a message through [place] or call our office at [phone].    TO LEAVE A MESSAGE FOR THE NURSE SELECT OPTION 2, PLEASE LEAVE A MESSAGE INCLUDING: YOUR NAME DATE OF BIRTH CALL BACK NUMBER REASON FOR CALL**this is important as we prioritize the call backs  YOU WILL RECEIVE A CALL BACK THE SAME DAY AS LONG AS YOU CALL BEFORE 4:00 PM  At the [facility], you and your health needs are our priority. As part of our continuing mission to provide you with exceptional heart care, we have created designated Provider Care Teams. These Care Teams include your primary Cardiologist (physician) and Advanced Practice Providers (APPs- Physician Assistants and Nurse Practitioners) who all work together to provide you with the care you need, when you need it.   You may see any of the following providers on your designated Care Team at your next follow up: Dr Toribio Fuel Dr Ezra Shuck Dr. [name] Greig Mosses, NP Caffie Shed, [place] [facility] [place], [place] Beckey Coe, NP Jordan Lee, NP Ellouise Class, NP Tinnie Redman, PharmD Jaun Bash, PharmD   Please be sure to bring in all your medications bottles to every appointment.    Thank you for choosing [facility]

## 2024-06-03 NOTE — Telephone Encounter (Signed)
 Stone was not seen on kub- recc f/u CT pelvis w/o contrast to make sure stone not still present
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
## 2024-06-03 NOTE — Progress Notes (Signed)
 ReDS Vest / Clip - 06/03/24 1500       ReDS Vest / Clip   Station Marker D    Ruler Value 34    ReDS Value Range High volume overload    ReDS Actual Value 41

## 2024-06-04 ENCOUNTER — Encounter: Payer: Self-pay | Admitting: Allergy

## 2024-06-04 ENCOUNTER — Ambulatory Visit (INDEPENDENT_AMBULATORY_CARE_PROVIDER_SITE_OTHER): Admitting: Allergy

## 2024-06-04 ENCOUNTER — Inpatient Hospital Stay

## 2024-06-04 ENCOUNTER — Other Ambulatory Visit: Payer: Self-pay

## 2024-06-04 VITALS — BP 141/83 | HR 59 | Temp 97.8°F | Resp 17 | Ht 72.0 in | Wt 273.5 lb

## 2024-06-04 VITALS — BP 80/50 | HR 72 | Temp 97.9°F | Ht 72.0 in | Wt 275.0 lb

## 2024-06-04 DIAGNOSIS — T7800XA Anaphylactic reaction due to unspecified food, initial encounter: Secondary | ICD-10-CM

## 2024-06-04 DIAGNOSIS — R031 Nonspecific low blood-pressure reading: Secondary | ICD-10-CM | POA: Diagnosis not present

## 2024-06-04 DIAGNOSIS — Z91018 Allergy to other foods: Secondary | ICD-10-CM

## 2024-06-04 DIAGNOSIS — E8581 Light chain (AL) amyloidosis: Secondary | ICD-10-CM

## 2024-06-04 DIAGNOSIS — Z5112 Encounter for antineoplastic immunotherapy: Secondary | ICD-10-CM | POA: Diagnosis not present

## 2024-06-04 DIAGNOSIS — I959 Hypotension, unspecified: Secondary | ICD-10-CM

## 2024-06-04 MED ORDER — SODIUM CHLORIDE 0.9 % IV SOLN: INTRAVENOUS | Status: DC

## 2024-06-04 MED ORDER — EPINEPHRINE 0.3 MG/0.3ML IJ SOAJ: 0.3000 mg | INTRAMUSCULAR | 1 refills | Status: AC | PRN

## 2024-06-04 MED ORDER — ALBUMIN HUMAN 25 % IV SOLN
25.0000 g | Freq: Once | INTRAVENOUS | Status: AC
  Administered 2024-06-04: 14:00:00 25 g via INTRAVENOUS
  Filled 2024-06-04: qty 100

## 2024-06-04 NOTE — Patient Instructions (Signed)
 Albumin  Injection What is this medication? ALBUMIN  (al BYOO min) treats low blood volume, which may occur with severe dehydration or bleeding. It works by increasing blood volume so your heart can pump blood to the rest of your body. It may also be used to treat low albumin  levels caused by surgery, infection, or other health conditions. Albumin  is a protein that helps your body balance the level of fluid in your blood vessels. This helps maintain a healthy blood pressure and prevents swelling or edema. This medicine may be used for other purposes; ask your health care provider or pharmacist if you have questions. COMMON BRAND NAME(S): Albuked , Albumarc, Albuminar, Albuminex , AlbuRx , Albutein , Buminate, Flexbumin , Kedbumin , Macrotec, Plasbumin , Plasbumin -20 What should I tell my care team before I take this medication? They need to know if you have any of these conditions: Heart disease Kidney disease Low red blood cell levels (anemia) An unusual or allergic reaction to albumin , other medications, foods, dyes, or preservatives Pregnant or trying to get pregnant Breastfeeding How should I use this medication? This medication is infused into a vein. It is given by your care team in a hospital or clinic setting. Talk to your care team about the use of this medication in children. While it may be given to children for selected conditions, precautions do apply. Overdosage: If you think you have taken too much of this medicine contact a poison control center or emergency room at once. NOTE: This medicine is only for you. Do not share this medicine with others. What if I miss a dose? This does not apply. What may interact with this medication? Interactions are not expected. This list may not describe all possible interactions. Give your health care provider a list of all the medicines, herbs, non-prescription drugs, or dietary supplements you use. Also tell them if you smoke, drink alcohol, or use  illegal drugs. Some items may interact with your medicine. What should I watch for while using this medication? Your condition will be monitored carefully while you are receiving this medication. This product is derived from human plasma. Talk to your care team about the risks and benefits of this medication. What side effects may I notice from receiving this medication? Side effects that you should report to your care team as soon as possible: Allergic reactions--skin rash, itching, hives, swelling of the face, lips, tongue, or throat Heart failure--shortness of breath, swelling of the ankles, feet, or hands, sudden weight gain, unusual weakness or fatigue Increase in blood pressure Shortness of breath or trouble breathing, cough, unusual weakness or fatigue, blue skin or lips Side effects that usually do not require medical attention (report these to your care team if they continue or are bothersome): Flushing Headache Nausea This list may not describe all possible side effects. Call your doctor for medical advice about side effects. You may report side effects to FDA at 1-800-FDA-1088. Where should I keep my medication? This medication is given in a hospital or clinic. It will not be stored at home. NOTE: This sheet is a summary. It may not cover all possible information. If you have questions about this medicine, talk to your doctor, pharmacist, or health care provider.  2024 Elsevier/Gold Standard (2023-05-19 00:00:00)

## 2024-06-04 NOTE — Patient Instructions (Addendum)
 Alpha gal allergy Continue strict avoidance of all mammalian meat. Get bloodwork If negative, will plan on food challenge next. If positive, will recheck in 2 years.  We are ordering labs, so please allow 1-2 weeks for the results to come back. With the newly implemented Cures Act, the labs might be visible to you at the same time that they become visible to me. However, I will not address the results until all of the results are back, so please be patient.  In the meantime, continue recommendations in your patient instructions, including avoidance measures (if applicable), until you hear from me.  I have prescribed epinephrine  injectable device and demonstrated proper use. For mild symptoms you can take over the counter antihistamines (zyrtec 10mg  to 20mg ) and monitor symptoms closely.  If symptoms worsen or if you have severe symptoms including breathing issues, throat closure, significant swelling, whole body hives, severe diarrhea and vomiting, lightheadedness then use epinephrine  and seek immediate medical care afterwards. Emergency action plan given.  Make sure the infusion center checks his blood pressure. Your blood pressure here today is 80/50.  Follow up in 1 year.   Alpha-gal and Red Meat Allergy   Overview An allergy to alpha-gal refers to having a severe and potentially life-threatening allergy to a carbohydrate molecule called galactose-alpha-1,3-galactose that is found in most mammalian or red meat. Unlike other food allergies which typically occur within minutes of ingestion, symptoms from eating red meat such as pork, lamb or beef may be delayed, occurring 3-8 hours after eating. Most food allergies are directed against a protein molecule, but alpha-gal is unusual because it is a carbohydrate, and a delay in its absorption may explain the delay in symptoms.  Helpful website: bikerfestival.is  What are the symptoms of an alpha-gal allergy? As with other food  allergies, signs or symptoms of an allergy to alpha-gal may include: Hives and itching  Swelling of your lips, face or eyelids  Shortness of breath, cough or wheezing  Abdominal pain, nausea, diarrhea or vomiting The most severe reaction, anaphylaxis, can present as a combination of several of these symptoms, may include low blood pressure, and is potentially fatal.  Because these symptoms are delayed, you may only wake up with them in the middle of the night after an evening meal.  How is an alpha-gal allergy diagnosed? Diagnosis of this allergy starts with your allergist taking an appropriate history and physical examination. Because the onset is usually quite delayed, it can be hard to associate the symptoms with eating red meat many hours previously. Triggers include any red meat - including beef, pork, lamb or even horse products. It may occur after eating hotdogs and hamburgers. In very rare cases the reaction may extend to milk or dairy proteins and gelatin.  Your allergist may recommend testing that includes skin tests to the relevant animal proteins and blood tests which measure the levels of a specific immunoglobulin E (IgE) antibody, to mammalian meats. An investigational blood test, IgE against alpha-gal itself, may also aid in the diagnosis.  How is an alpha-gal allergy treated? Immediate symptoms such as hives or shortness of breath are treated the same as any other food allergy - in an urgent care setting with anti-histamines, epinephrine  and other medications. Prevention long-term involves avoidance of all red meat in sensitized individuals. You may be advised to carry an epinephrine  auto-injector, to be used in case of subsequent accidental exposures and reaction. These measures do not necessarily mean switching to a full vegetarian diet,  since poultry and fish can be consumed and do not cause similar reactions. As with other food allergies, there is the possibility that over time the  sensitivity diminishes - although these changes may take many years to become apparent.  How do you become allergic to alpha-gal? Alpha-gal is a molecule carried in the saliva of the [place] tick and other potential arthropods typically after feeding on mammalian blood. People that are bitten by the tick, especially those that are bitten repeatedly, are at risk of becoming sensitized and producing the IgE necessary to then cause allergic reactions. Interestingly, allergic reactions may occur to red meat, to subsequent tick bites, and even to medications that contain alpha-gal. Cetuximab is a cancer medication that contains alpha-gal, and people who have had allergic reactions to this medication (these are typically immediate reactions, because it is infused intravenously) have a higher risk for red meat allergy and are likely to have been bitten by ticks in the past. As might be expected, the incidence of tick bites is much higher in the [place] and [place] the traditional habitat for the tick. However, cases are now increasingly reported in the northern and western states. And it is a phenomenon that has been observed worldwide, with different ticks responsible for similar cases of red meat allergy in many other countries such as [place], [place] and [place].  The discovery of this peculiar allergy has allowed researchers to correlate tick bites with many cases of anaphylaxis that would previously have been classified as idiopathic, or of unknown cause. Also, while it was originally thought that the [employer] tick had to feast on mammalian blood in order to carry the alpha-gal molecule, more recent research has shown that it may carry this molecule and be capable of sensitizing humans independently.  How do you prevent an alpha-gal allergy? Because this allergy is predominantly tick born, you are more likely at risk if you often go outdoors in wooded areas for activities such as hiking,  fishing or hunting. The key strategy is to prevent tick bites. This may include wearing long sleeved shirts or pants, using appropriate insect repellants, and surveying for ticks after spending time outdoors. Any observed ticks should be removed carefully by cleaning the site with rubbing alcohol, then using tweezers to pull the ticks head up carefully from the skin using steady pressure. Clean your hands and the site one more time and make sure not to crush the tick between your fingers.

## 2024-06-06 ENCOUNTER — Telehealth: Payer: Self-pay | Admitting: *Deleted

## 2024-06-06 ENCOUNTER — Other Ambulatory Visit: Payer: Self-pay | Admitting: *Deleted

## 2024-06-06 DIAGNOSIS — N200 Calculus of kidney: Secondary | ICD-10-CM

## 2024-06-06 NOTE — Telephone Encounter (Signed)
 Patient's wife left a message on voicemail. She shared they are requesting the patient's records be sent to [facility]. He will be seeing a Dr. Odella Jacobson. They will need his last 2 office notes, lab work and a copy of his [employer]. Fax Number is [phone].  [name] left a number for her if we needed to reach out to her. [phone].

## 2024-06-06 NOTE — Telephone Encounter (Signed)
 Sent order in and sent patient a my chart message.

## 2024-06-06 NOTE — Telephone Encounter (Signed)
 Pt has changed his mind and would like to proceed with CT scan.  He would like to go to [facility] on [address], this year if at all possible, since he's met his deductible.

## 2024-06-06 NOTE — Telephone Encounter (Signed)
 I called and spoke with his wife, I made her aware he would have to sign a medical record release for those to be sent

## 2024-06-07 ENCOUNTER — Ambulatory Visit (HOSPITAL_COMMUNITY)
Admission: RE | Admit: 2024-06-07 | Discharge: 2024-06-07 | Disposition: A | Discharge status: Home / Self Care | Source: Ambulatory Visit | Attending: Urology | Admitting: Urology

## 2024-06-07 DIAGNOSIS — N200 Calculus of kidney: Secondary | ICD-10-CM | POA: Diagnosis present

## 2024-06-07 NOTE — Progress Notes (Signed)
 "  ADVANCED HEART FAILURE FOLLOW UP CLINIC NOTE  Referring Physician: [name], MD  Primary Care: [name], MD Primary Cardiologist:  HPI: Joseph [name]. is a 58 y.o. male who presents for follow up of HFpEF and AL amyloid.      Patient has a fairly complex medical history.  Notable history of coronary artery disease with PCI to the LAD in 2015, moderate RCA stenosis at the time.  He established care with [facility] cardiology in 2023 for HCM and was trialed on mavacamten for worsening obstructive symptoms.  he reported that the medication interfere with his pain medication and so he was referred to [facility] clinic for septal myectomy.  Procedure was complicated by postoperative atrial fibrillation, likely TIA/CVA, but otherwise good result with improvement in gradient.  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  Mass spectroscopy was not able to identify the dominant amyloid protein.   Since that time he was admitted in December for CVA and was placed on aspirin /Plavix .  Additionally, he was admitted in March for symptomatic hypotension.  All blood pressure medications were held with improvement.  He was also found to have new nephrotic syndrome and follow-up was established with nephrology.   Since that time he had a bone marrow biopsy on 09/28/23 that showed small foci of amyloid deposits and 10% plasma cell burden.  Patient was started on Dara-CyBorD, though planning to hold Velcade given neuropathy.  Admission for volume overload, given hypotension felt the need for inpatient observation with IV diuresis.   Re-admitted 7/25 with diarrhea, he had self decreased torsemide  dose due to GI symptoms, and ultimately stopped diuretic. Received supportive care, electrolytes repleted and given IV albumin . Discharged home, weight 276 lbs.   Overall has had excellent response to therapy.  Currently in  remission, maintenance therapy ongoing.      SUBJECTIVE:  Feeling fair today, BP is low, but they had a recent stretch where his BP had been running normal, including 1 around 160, so had spaced out his midodrine  a little. Remains mildly volume up, but his legs are much improved from previous. Discussed diuretic dosing, now regularly taking 1 pill, have backed off the albumin .   PMH, current medications, allergies, social history, and family history reviewed in epic.  PHYSICAL EXAM: Vitals:   06/03/24 1500  BP: (!) 84/50  Pulse: 70  SpO2: 97%    GENERAL: NAD, fair appearing PULM:  Normal work of breathing, CTAB CARDIAC:  JVP: flat         Normal rate with regular rhythm. No murmurs, rubs or gallops.  1+ LE edema. Warm and well perfused extremities. ABDOMEN: Soft, non-tender, non-distended. NEUROLOGIC: Patient is oriented x3 with no focal or lateralizing neurologic deficits.     DATA REVIEW  ECG: 10/03/2022: NSR, LBBB    ECHO: 08/22/23: S/p septal reduction therapy, grade I DD, LVH  11/29/2023: LVEF 50 to 55%, moderate LVH, RV size is normal  CATH: 07/2022: moderate proximal RCA stenosis, patent LAD and Lcx with mid ISR of mid LAD stent. 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   Main symptom continues to be  debilitating orthostatic hypotension as well as volume overload, largely due to poor oncotic pressure.  These are improving, has a 24 hour urine pending to assess proteinuria. Discussed increasing diuretic dosing as able given volume overload, elevated ReDs.  - Wife very diligent about BP, has done an excellent job adjusting the midodrine  and  droxidopa  as needed - Hopeful this will improve - Continue torsemide  20mg  daily, may need to increase at next visit - Continue midodrine  20mg  TID with fourth dose as needed - Continue droxidopa  100mg  TID, weaning - Continue oncology follow up   CAD s/p single vessel CABG: Prior LAD PCI. - Continue plavix  alone   CVA: No evidence of atrial fibrillation.  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 - Follow up with nephrology  Follow-up in 3 months  [name], MD Advanced Heart Failure Mechanical Circulatory Support 06/07/2024 "

## 2024-06-11 ENCOUNTER — Inpatient Hospital Stay (HOSPITAL_BASED_OUTPATIENT_CLINIC_OR_DEPARTMENT_OTHER): Admitting: Physician Assistant

## 2024-06-11 ENCOUNTER — Inpatient Hospital Stay

## 2024-06-11 ENCOUNTER — Other Ambulatory Visit: Payer: Self-pay

## 2024-06-11 VITALS — BP 117/70 | HR 72 | Temp 98.1°F | Resp 19

## 2024-06-11 VITALS — BP 108/66 | HR 71 | Temp 97.3°F | Resp 20 | Wt 277.9 lb

## 2024-06-11 DIAGNOSIS — C9000 Multiple myeloma not having achieved remission: Secondary | ICD-10-CM

## 2024-06-11 DIAGNOSIS — E8581 Light chain (AL) amyloidosis: Secondary | ICD-10-CM

## 2024-06-11 DIAGNOSIS — E8809 Other disorders of plasma-protein metabolism, not elsewhere classified: Secondary | ICD-10-CM

## 2024-06-11 DIAGNOSIS — D8989 Other specified disorders involving the immune mechanism, not elsewhere classified: Secondary | ICD-10-CM

## 2024-06-11 DIAGNOSIS — I959 Hypotension, unspecified: Secondary | ICD-10-CM

## 2024-06-11 DIAGNOSIS — R21 Rash and other nonspecific skin eruption: Secondary | ICD-10-CM

## 2024-06-11 DIAGNOSIS — Z5112 Encounter for antineoplastic immunotherapy: Secondary | ICD-10-CM | POA: Diagnosis not present

## 2024-06-11 LAB — CBC WITH DIFFERENTIAL (CANCER CENTER ONLY)
Abs Immature Granulocytes: 0.01 K/uL (ref 0.00–0.07)
Basophils Absolute: 0 K/uL (ref 0.0–0.1)
Basophils Relative: 0 %
Eosinophils Absolute: 0.1 K/uL (ref 0.0–0.5)
Eosinophils Relative: 1 %
HCT: 38 % — ABNORMAL LOW (ref 39.0–52.0)
Hemoglobin: 12.5 g/dL — ABNORMAL LOW (ref 13.0–17.0)
Immature Granulocytes: 0 %
Lymphocytes Relative: 29 %
Lymphs Abs: 2.2 K/uL (ref 0.7–4.0)
MCH: 30.1 pg (ref 26.0–34.0)
MCHC: 32.9 g/dL (ref 30.0–36.0)
MCV: 91.6 fL (ref 80.0–100.0)
Monocytes Absolute: 0.6 K/uL (ref 0.1–1.0)
Monocytes Relative: 7 %
Neutro Abs: 4.9 K/uL (ref 1.7–7.7)
Neutrophils Relative %: 63 %
Platelet Count: 254 K/uL (ref 150–400)
RBC: 4.15 MIL/uL — ABNORMAL LOW (ref 4.22–5.81)
RDW: 15 % (ref 11.5–15.5)
WBC Count: 7.8 K/uL (ref 4.0–10.5)
nRBC: 0 % (ref 0.0–0.2)

## 2024-06-11 LAB — CMP (CANCER CENTER ONLY)
ALT: 51 U/L — ABNORMAL HIGH (ref 0–44)
AST: 56 U/L — ABNORMAL HIGH (ref 15–41)
Albumin: 2.1 g/dL — ABNORMAL LOW (ref 3.5–5.0)
Alkaline Phosphatase: 111 U/L (ref 38–126)
Anion gap: 6 (ref 5–15)
BUN: 19 mg/dL (ref 6–20)
CO2: 32 mmol/L (ref 22–32)
Calcium: 7.9 mg/dL — ABNORMAL LOW (ref 8.9–10.3)
Chloride: 104 mmol/L (ref 98–111)
Creatinine: 0.58 mg/dL — ABNORMAL LOW (ref 0.61–1.24)
GFR, Estimated: >60 mL/min
Glucose, Bld: 111 mg/dL — ABNORMAL HIGH (ref 70–99)
Potassium: 3.8 mmol/L (ref 3.5–5.1)
Sodium: 142 mmol/L (ref 135–145)
Total Bilirubin: <0.2 mg/dL (ref 0.0–1.2)
Total Protein: 3.7 g/dL — ABNORMAL LOW (ref 6.5–8.1)

## 2024-06-11 LAB — UPEP/UIFE/LIGHT CHAINS/TP, 24-HR UR
% BETA, Urine: 17.4 %
ALPHA 1 URINE: 11 %
Albumin, U: 62.9 %
Alpha 2, Urine: 6.9 %
Free Kappa Lt Chains,Ur: 17.33 mg/L (ref 1.17–86.46)
Free Kappa/Lambda Ratio: 1.06 — ABNORMAL LOW (ref 1.83–14.26)
Free Lambda Lt Chains,Ur: 16.41 mg/L — ABNORMAL HIGH (ref 0.27–15.21)
GAMMA GLOBULIN URINE: 1.8 %
Total Protein, Urine-Ur/day: 6896 mg/(24.h) — ABNORMAL HIGH (ref 30–150)
Total Protein, Urine: 328.4 mg/dL
Total Volume: 2100

## 2024-06-11 MED ORDER — SODIUM CHLORIDE 0.9 % IV SOLN: INTRAVENOUS | Status: DC

## 2024-06-11 MED ORDER — DEXAMETHASONE 6 MG PO TABS
12.0000 mg | ORAL_TABLET | Freq: Once | ORAL | Status: AC
  Administered 2024-06-11: 12 mg via ORAL
  Filled 2024-06-11: qty 2

## 2024-06-11 MED ORDER — ALBUMIN HUMAN 25 % IV SOLN
25.0000 g | Freq: Once | INTRAVENOUS | Status: AC
  Administered 2024-06-11: 25 g via INTRAVENOUS
  Filled 2024-06-11: qty 100

## 2024-06-11 MED ORDER — DIPHENHYDRAMINE HCL 25 MG PO CAPS
50.0000 mg | ORAL_CAPSULE | Freq: Once | ORAL | Status: AC
  Administered 2024-06-11: 50 mg via ORAL
  Filled 2024-06-11: qty 2

## 2024-06-11 MED ORDER — ACETAMINOPHEN 325 MG PO TABS
650.0000 mg | ORAL_TABLET | Freq: Once | ORAL | Status: AC
  Administered 2024-06-11: 650 mg via ORAL
  Filled 2024-06-11: qty 2

## 2024-06-11 MED ORDER — DARATUMUMAB-HYALURONIDASE-FIHJ 1800-30000 MG-UT/15ML ~~LOC~~ SOLN
1800.0000 mg | Freq: Once | SUBCUTANEOUS | Status: AC
  Administered 2024-06-11: 1800 mg via SUBCUTANEOUS
  Filled 2024-06-11: qty 15

## 2024-06-11 NOTE — Patient Instructions (Signed)
 [facility] - A DEPT OF [facility]  Discharge Instructions: Thank you for choosing [facility] to provide your oncology and hematology care.   If you have a lab appointment with the Cancer Center, please go directly to the Cancer Center and check in at the registration area.   Wear comfortable clothing and clothing appropriate for easy access to any Portacath or PICC line.   We strive to give you quality time with your provider. You may need to reschedule your appointment if you arrive late (15 or more minutes).  Arriving late affects you and other patients whose appointments are after yours.  Also, if you miss three or more appointments without notifying the office, you may be dismissed from the clinic at the providers discretion.      For prescription refill requests, have your pharmacy contact our office and allow 72 hours for refills to be completed.    Today you received the following chemotherapy and/or immunotherapy agents: Daratumumab -hyaluronidase -fihj (Darzalex  faspro)    To help prevent nausea and vomiting after your treatment, we encourage you to take your nausea medication as directed.  BELOW ARE SYMPTOMS THAT SHOULD BE REPORTED IMMEDIATELY: *FEVER GREATER THAN 100.4 F (38 C) OR HIGHER *CHILLS OR SWEATING *NAUSEA AND VOMITING THAT IS NOT CONTROLLED WITH YOUR NAUSEA MEDICATION *UNUSUAL SHORTNESS OF BREATH *UNUSUAL BRUISING OR BLEEDING *URINARY PROBLEMS (pain or burning when urinating, or frequent urination) *BOWEL PROBLEMS (unusual diarrhea, constipation, pain near the anus) TENDERNESS IN MOUTH AND THROAT WITH OR WITHOUT PRESENCE OF ULCERS (sore throat, sores in mouth, or a toothache) UNUSUAL RASH, SWELLING OR PAIN  UNUSUAL VAGINAL DISCHARGE OR ITCHING   Items with * indicate a potential emergency and should be followed up as soon as possible or go to the Emergency Department if any problems should occur.  Please show the  CHEMOTHERAPY ALERT CARD or IMMUNOTHERAPY ALERT CARD at check-in to the Emergency Department and triage nurse.  Should you have questions after your visit or need to cancel or reschedule your appointment, please contact [facility] - A DEPT OF [facility]  Dept: [phone]  and follow the prompts.  Office hours are 8:00 a.m. to 4:30 p.m. Monday - Friday. Please note that voicemails left after 4:00 p.m. may not be returned until the following business day.  We are closed weekends and major holidays. You have access to a nurse at all times for urgent questions. Please call the main number to the clinic Dept: [phone] and follow the prompts.   For any non-urgent questions, you may also contact your provider using MyChart. We now offer e-Visits for anyone 55 and older to request care online for non-urgent symptoms. For details visit [email or web address].   Also download the MyChart app! Go to the app store, search MyChart, open the app, select [facility], and log in with your MyChart username and password.  Albumin  Injection What is this medication? ALBUMIN  (al BYOO min) treats low blood volume, which may occur with severe dehydration or bleeding. It works by increasing blood volume so your heart can pump blood to the rest of your body. It may also be used to treat low albumin  levels caused by surgery, infection, or other health conditions. Albumin  is a protein that helps your body balance the level of fluid in your blood vessels. This helps maintain a healthy blood pressure and prevents swelling or edema. This medicine may be used  for other purposes; ask your health care provider or pharmacist if you have questions. COMMON BRAND NAME(S): Albuked , Albumarc, Albuminar, Albuminex , AlbuRx , Albutein , Buminate, Flexbumin , Kedbumin , Macrotec, Plasbumin , Plasbumin -20 What should I tell my care team before I take this medication? They need to know if you have any of  these conditions: Heart disease Kidney disease Low red blood cell levels (anemia) An unusual or allergic reaction to albumin , other medications, foods, dyes, or preservatives Pregnant or trying to get pregnant Breastfeeding How should I use this medication? This medication is infused into a vein. It is given by your care team in a hospital or clinic setting. Talk to your care team about the use of this medication in children. While it may be given to children for selected conditions, precautions do apply. Overdosage: If you think you have taken too much of this medicine contact a poison control center or emergency room at once. NOTE: This medicine is only for you. Do not share this medicine with others. What if I miss a dose? This does not apply. What may interact with this medication? Interactions are not expected. This list may not describe all possible interactions. Give your health care provider a list of all the medicines, herbs, non-prescription drugs, or dietary supplements you use. Also tell them if you smoke, drink alcohol, or use illegal drugs. Some items may interact with your medicine. What should I watch for while using this medication? Your condition will be monitored carefully while you are receiving this medication. This product is derived from human plasma. Talk to your care team about the risks and benefits of this medication. What side effects may I notice from receiving this medication? Side effects that you should report to your care team as soon as possible: Allergic reactions--skin rash, itching, hives, swelling of the face, lips, tongue, or throat Heart failure--shortness of breath, swelling of the ankles, feet, or hands, sudden weight gain, unusual weakness or fatigue Increase in blood pressure Shortness of breath or trouble breathing, cough, unusual weakness or fatigue, blue skin or lips Side effects that usually do not require medical attention (report these to  your care team if they continue or are bothersome): Flushing Headache Nausea This list may not describe all possible side effects. Call your doctor for medical advice about side effects. You may report side effects to FDA at 1-800-FDA-1088. Where should I keep my medication? This medication is given in a hospital or clinic. It will not be stored at home. NOTE: This sheet is a summary. It may not cover all possible information. If you have questions about this medicine, talk to your doctor, pharmacist, or health care provider.  2024 Elsevier/Gold Standard (2023-05-19 00:00:00)  Albumin  Injection  What is this medication? ALBUMIN  (al BYOO min) treats low blood volume, which may occur with severe dehydration or bleeding. It works by increasing blood volume so your heart can pump blood to the rest of your body. It may also be used to treat low albumin  levels caused by surgery, infection, or other health conditions. Albumin  is a protein that helps your body balance the level of fluid in your blood vessels. This helps maintain a healthy blood pressure and prevents swelling or edema. This medicine may be used for other purposes; ask your health care provider or pharmacist if you have questions. COMMON BRAND NAME(S): Albuked , Albumarc, Albuminar, Albuminex , AlbuRx , Albutein , Buminate, Flexbumin , Kedbumin , Macrotec, Plasbumin , Plasbumin -20 What should I tell my care team before I take this medication? They need to know  if you have any of these conditions: Heart disease Kidney disease Low red blood cell levels (anemia) An unusual or allergic reaction to albumin , other medications, foods, dyes, or preservatives Pregnant or trying to get pregnant Breastfeeding How should I use this medication? This medication is infused into a vein. It is given by your care team in a hospital or clinic setting. Talk to your care team about the use of this medication in children. While it may be given to children for  selected conditions, precautions do apply. Overdosage: If you think you have taken too much of this medicine contact a poison control center or emergency room at once. NOTE: This medicine is only for you. Do not share this medicine with others. What if I miss a dose? This does not apply. What may interact with this medication? Interactions are not expected. This list may not describe all possible interactions. Give your health care provider a list of all the medicines, herbs, non-prescription drugs, or dietary supplements you use. Also tell them if you smoke, drink alcohol, or use illegal drugs. Some items may interact with your medicine. What should I watch for while using this medication? Your condition will be monitored carefully while you are receiving this medication. This product is derived from human plasma. Talk to your care team about the risks and benefits of this medication. What side effects may I notice from receiving this medication? Side effects that you should report to your care team as soon as possible: Allergic reactions--skin rash, itching, hives, swelling of the face, lips, tongue, or throat Heart failure--shortness of breath, swelling of the ankles, feet, or hands, sudden weight gain, unusual weakness or fatigue Increase in blood pressure Shortness of breath or trouble breathing, cough, unusual weakness or fatigue, blue skin or lips Side effects that usually do not require medical attention (report these to your care team if they continue or are bothersome): Flushing Headache Nausea This list may not describe all possible side effects. Call your doctor for medical advice about side effects. You may report side effects to FDA at 1-800-FDA-1088. Where should I keep my medication? This medication is given in a hospital or clinic. It will not be stored at home. NOTE: This sheet is a summary. It may not cover all possible information. If you have questions about this  medicine, talk to your doctor, pharmacist, or health care provider.  2024 Elsevier/Gold Standard (2023-05-19 00:00:00)

## 2024-06-11 NOTE — Addendum Note (Signed)
 Addended by: Graci Hulce T on: 06/11/2024 11:49 AM   Modules accepted: Orders
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
## 2024-06-12 ENCOUNTER — Other Ambulatory Visit: Payer: Self-pay

## 2024-06-14 ENCOUNTER — Ambulatory Visit: Payer: Self-pay | Admitting: Urology

## 2024-06-14 ENCOUNTER — Other Ambulatory Visit: Payer: Self-pay

## 2024-06-14 LAB — ALPHA-GAL PANEL
Allergen Lamb IgE: 14 kU/L — AB
Beef IgE: 16.4 kU/L — AB
IgE (Immunoglobulin E), Serum: 93 [IU]/mL (ref 6–495)
O215-IgE Alpha-Gal: 43.3 kU/L — AB
Pork IgE: 4.42 kU/L — AB

## 2024-06-14 LAB — SPECIMEN STATUS REPORT

## 2024-06-14 LAB — KAPPA/LAMBDA LIGHT CHAINS
Kappa free light chain: 13.8 mg/L (ref 3.3–19.4)
Kappa, lambda light chain ratio: 0.82 (ref 0.26–1.65)
Lambda free light chains: 16.9 mg/L (ref 5.7–26.3)

## 2024-06-14 NOTE — Progress Notes (Signed)
 Specialty Pharmacy Refill Coordination Note  Joseph [name]. is a 58 y.o. male contacted today regarding refills of specialty medication(s) Droxidopa  (NORTHERA )   Patient requested [name] at [facility] at [place] date: 06/15/24   Medication will be filled on: 06/14/24

## 2024-06-16 ENCOUNTER — Ambulatory Visit: Payer: Self-pay | Admitting: Allergy

## 2024-06-17 LAB — MULTIPLE MYELOMA PANEL, SERUM
Albumin SerPl Elph-Mcnc: 1.3 g/dL — ABNORMAL LOW (ref 2.9–4.4)
Albumin/Glob SerPl: 0.6 — ABNORMAL LOW (ref 0.7–1.7)
Alpha 1: 0.1 g/dL (ref 0.0–0.4)
Alpha2 Glob SerPl Elph-Mcnc: 1.3 g/dL — ABNORMAL HIGH (ref 0.4–1.0)
B-Globulin SerPl Elph-Mcnc: 0.7 g/dL (ref 0.7–1.3)
Gamma Glob SerPl Elph-Mcnc: 0.2 g/dL — ABNORMAL LOW (ref 0.4–1.8)
Globulin, Total: 2.3 g/dL (ref 2.2–3.9)
IgA: 66 mg/dL — ABNORMAL LOW (ref 90–386)
IgG (Immunoglobin G), Serum: 219 mg/dL — ABNORMAL LOW (ref 603–1613)
IgM (Immunoglobulin M), Srm: 70 mg/dL (ref 20–172)
Total Protein ELP: 3.6 g/dL — ABNORMAL LOW (ref 6.0–8.5)

## 2024-06-18 ENCOUNTER — Inpatient Hospital Stay

## 2024-06-18 VITALS — BP 148/81 | HR 66 | Temp 97.6°F | Resp 16

## 2024-06-18 DIAGNOSIS — Z5112 Encounter for antineoplastic immunotherapy: Secondary | ICD-10-CM | POA: Diagnosis not present

## 2024-06-18 DIAGNOSIS — I959 Hypotension, unspecified: Secondary | ICD-10-CM

## 2024-06-18 MED ORDER — ALBUMIN HUMAN 25 % IV SOLN
25.0000 g | Freq: Once | INTRAVENOUS | Status: AC
  Administered 2024-06-18: 25 g via INTRAVENOUS
  Filled 2024-06-18: qty 100
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
## 2024-06-24 ENCOUNTER — Other Ambulatory Visit: Payer: Self-pay | Admitting: Hematology

## 2024-06-24 DIAGNOSIS — E8581 Light chain (AL) amyloidosis: Secondary | ICD-10-CM

## 2024-06-24 DIAGNOSIS — C9000 Multiple myeloma not having achieved remission: Secondary | ICD-10-CM

## 2024-06-25 ENCOUNTER — Encounter: Payer: Self-pay | Admitting: Hematology

## 2024-06-25 ENCOUNTER — Other Ambulatory Visit: Payer: Self-pay | Admitting: *Deleted

## 2024-06-25 ENCOUNTER — Inpatient Hospital Stay: Attending: Internal Medicine

## 2024-06-25 ENCOUNTER — Inpatient Hospital Stay: Admitting: Dietician

## 2024-06-25 VITALS — BP 139/89 | HR 60 | Temp 98.0°F | Resp 16

## 2024-06-25 DIAGNOSIS — Z79899 Other long term (current) drug therapy: Secondary | ICD-10-CM | POA: Insufficient documentation

## 2024-06-25 DIAGNOSIS — Z79891 Long term (current) use of opiate analgesic: Secondary | ICD-10-CM | POA: Insufficient documentation

## 2024-06-25 DIAGNOSIS — C9000 Multiple myeloma not having achieved remission: Secondary | ICD-10-CM | POA: Insufficient documentation

## 2024-06-25 DIAGNOSIS — Z87442 Personal history of urinary calculi: Secondary | ICD-10-CM | POA: Diagnosis not present

## 2024-06-25 DIAGNOSIS — E8581 Light chain (AL) amyloidosis: Secondary | ICD-10-CM | POA: Insufficient documentation

## 2024-06-25 DIAGNOSIS — Z5112 Encounter for antineoplastic immunotherapy: Secondary | ICD-10-CM | POA: Insufficient documentation

## 2024-06-25 DIAGNOSIS — I959 Hypotension, unspecified: Secondary | ICD-10-CM

## 2024-06-25 MED ORDER — ALBUMIN HUMAN 25 % IV SOLN
25.0000 g | Freq: Once | INTRAVENOUS | Status: AC
  Administered 2024-06-25: 25 g via INTRAVENOUS
  Filled 2024-06-25: qty 100

## 2024-06-25 MED ORDER — SODIUM CHLORIDE 0.9 % IV SOLN: Freq: Once | INTRAVENOUS | Status: AC

## 2024-06-25 NOTE — Patient Instructions (Signed)
 Albumin  Injection What is this medication? ALBUMIN  (al BYOO min) treats low blood volume, which may occur with severe dehydration or bleeding. It works by increasing blood volume so your heart can pump blood to the rest of your body. It may also be used to treat low albumin  levels caused by surgery, infection, or other health conditions. Albumin  is a protein that helps your body balance the level of fluid in your blood vessels. This helps maintain a healthy blood pressure and prevents swelling or edema. This medicine may be used for other purposes; ask your health care provider or pharmacist if you have questions. COMMON BRAND NAME(S): Albuked , Albumarc, Albuminar, Albuminex , AlbuRx , Albutein , Buminate, Flexbumin , Kedbumin , Macrotec, Plasbumin , Plasbumin -20 What should I tell my care team before I take this medication? They need to know if you have any of these conditions: Heart disease Kidney disease Low red blood cell levels (anemia) An unusual or allergic reaction to albumin , other medications, foods, dyes, or preservatives Pregnant or trying to get pregnant Breastfeeding How should I use this medication? This medication is infused into a vein. It is given by your care team in a hospital or clinic setting. Talk to your care team about the use of this medication in children. While it may be given to children for selected conditions, precautions do apply. Overdosage: If you think you have taken too much of this medicine contact a poison control center or emergency room at once. NOTE: This medicine is only for you. Do not share this medicine with others. What if I miss a dose? This does not apply. What may interact with this medication? Interactions are not expected. This list may not describe all possible interactions. Give your health care provider a list of all the medicines, herbs, non-prescription drugs, or dietary supplements you use. Also tell them if you smoke, drink alcohol, or use  illegal drugs. Some items may interact with your medicine. What should I watch for while using this medication? Your condition will be monitored carefully while you are receiving this medication. This product is derived from human plasma. Talk to your care team about the risks and benefits of this medication. What side effects may I notice from receiving this medication? Side effects that you should report to your care team as soon as possible: Allergic reactions--skin rash, itching, hives, swelling of the face, lips, tongue, or throat Heart failure--shortness of breath, swelling of the ankles, feet, or hands, sudden weight gain, unusual weakness or fatigue Increase in blood pressure Shortness of breath or trouble breathing, cough, unusual weakness or fatigue, blue skin or lips Side effects that usually do not require medical attention (report these to your care team if they continue or are bothersome): Flushing Headache Nausea This list may not describe all possible side effects. Call your doctor for medical advice about side effects. You may report side effects to FDA at 1-800-FDA-1088. Where should I keep my medication? This medication is given in a hospital or clinic. It will not be stored at home. NOTE: This sheet is a summary. It may not cover all possible information. If you have questions about this medicine, talk to your doctor, pharmacist, or health care provider.  2024 Elsevier/Gold Standard (2023-05-19 00:00:00)

## 2024-06-26 ENCOUNTER — Encounter: Payer: Self-pay | Admitting: Hematology

## 2024-06-29 ENCOUNTER — Encounter: Payer: Self-pay | Admitting: Hematology

## 2024-07-02 ENCOUNTER — Encounter: Payer: Self-pay | Admitting: Hematology

## 2024-07-02 ENCOUNTER — Inpatient Hospital Stay

## 2024-07-02 VITALS — BP 118/86 | HR 64 | Temp 97.7°F | Resp 16 | Wt 274.0 lb

## 2024-07-02 DIAGNOSIS — Z5112 Encounter for antineoplastic immunotherapy: Secondary | ICD-10-CM | POA: Diagnosis not present

## 2024-07-02 DIAGNOSIS — D469 Myelodysplastic syndrome, unspecified: Secondary | ICD-10-CM

## 2024-07-02 DIAGNOSIS — I959 Hypotension, unspecified: Secondary | ICD-10-CM

## 2024-07-02 MED ORDER — LOPERAMIDE HCL 2 MG PO CAPS
2.0000 mg | ORAL_CAPSULE | Freq: Once | ORAL | Status: AC
  Administered 2024-07-02: 2 mg via ORAL
  Filled 2024-07-02: qty 1

## 2024-07-02 MED ORDER — ALBUMIN HUMAN 25 % IV SOLN
25.0000 g | Freq: Once | INTRAVENOUS | Status: AC
  Administered 2024-07-02: 25 g via INTRAVENOUS
  Filled 2024-07-02: qty 100

## 2024-07-02 NOTE — Progress Notes (Signed)
 Order for Imodium  2mg  entered per Dr. Onesimo.  Evah Rashid, PharmD, MBA

## 2024-07-02 NOTE — Patient Instructions (Signed)
 Albumin  Injection What is this medication? ALBUMIN  (al BYOO min) treats low blood volume, which may occur with severe dehydration or bleeding. It works by increasing blood volume so your heart can pump blood to the rest of your body. It may also be used to treat low albumin  levels caused by surgery, infection, or other health conditions. Albumin  is a protein that helps your body balance the level of fluid in your blood vessels. This helps maintain a healthy blood pressure and prevents swelling or edema. This medicine may be used for other purposes; ask your health care provider or pharmacist if you have questions. COMMON BRAND NAME(S): Albuked , Albumarc, Albuminar, Albuminex , AlbuRx , Albutein , Buminate, Flexbumin , Kedbumin , Macrotec, Plasbumin , Plasbumin -20 What should I tell my care team before I take this medication? They need to know if you have any of these conditions: Heart disease Kidney disease Low red blood cell levels (anemia) An unusual or allergic reaction to albumin , other medications, foods, dyes, or preservatives Pregnant or trying to get pregnant Breastfeeding How should I use this medication? This medication is infused into a vein. It is given by your care team in a hospital or clinic setting. Talk to your care team about the use of this medication in children. While it may be given to children for selected conditions, precautions do apply. Overdosage: If you think you have taken too much of this medicine contact a poison control center or emergency room at once. NOTE: This medicine is only for you. Do not share this medicine with others. What if I miss a dose? This does not apply. What may interact with this medication? Interactions are not expected. This list may not describe all possible interactions. Give your health care provider a list of all the medicines, herbs, non-prescription drugs, or dietary supplements you use. Also tell them if you smoke, drink alcohol, or use  illegal drugs. Some items may interact with your medicine. What should I watch for while using this medication? Your condition will be monitored carefully while you are receiving this medication. This product is derived from human plasma. Talk to your care team about the risks and benefits of this medication. What side effects may I notice from receiving this medication? Side effects that you should report to your care team as soon as possible: Allergic reactions--skin rash, itching, hives, swelling of the face, lips, tongue, or throat Heart failure--shortness of breath, swelling of the ankles, feet, or hands, sudden weight gain, unusual weakness or fatigue Increase in blood pressure Shortness of breath or trouble breathing, cough, unusual weakness or fatigue, blue skin or lips Side effects that usually do not require medical attention (report these to your care team if they continue or are bothersome): Flushing Headache Nausea This list may not describe all possible side effects. Call your doctor for medical advice about side effects. You may report side effects to FDA at 1-800-FDA-1088. Where should I keep my medication? This medication is given in a hospital or clinic. It will not be stored at home. NOTE: This sheet is a summary. It may not cover all possible information. If you have questions about this medicine, talk to your doctor, pharmacist, or health care provider.  2024 Elsevier/Gold Standard (2023-05-19 00:00:00)

## 2024-07-02 NOTE — Progress Notes (Signed)
 Okay to give an additional 2 mg Imodium  for a total of 4 mg dose.  Harlene Nasuti, PharmD Oncology Infusion Pharmacist 07/02/2024 2:10 PM

## 2024-07-03 ENCOUNTER — Ambulatory Visit: Admitting: Podiatry

## 2024-07-03 ENCOUNTER — Encounter: Payer: Self-pay | Admitting: Hematology

## 2024-07-03 ENCOUNTER — Encounter: Payer: Self-pay | Admitting: Podiatry

## 2024-07-03 DIAGNOSIS — M79675 Pain in left toe(s): Secondary | ICD-10-CM

## 2024-07-03 DIAGNOSIS — M79674 Pain in right toe(s): Secondary | ICD-10-CM | POA: Diagnosis not present

## 2024-07-03 DIAGNOSIS — D689 Coagulation defect, unspecified: Secondary | ICD-10-CM

## 2024-07-03 DIAGNOSIS — B351 Tinea unguium: Secondary | ICD-10-CM

## 2024-07-03 NOTE — Progress Notes (Signed)
 This patient presents to the office with chief complaint of long thick painful nails.  Patient says the nails are painful walking and wearing shoes.  This patient is unable to self treat.  This patient is unable to trim her nails since he is unable to reach her nails.   Patient has history of TIA for which he takes plavix . he presents to the office for preventative foot care services.  General Appearance  Alert, conversant and in no acute stress.  Vascular  Dorsalis pedis and posterior tibial  pulses are  non palpable  due to swelling  bilaterally.  Capillary return is within normal limits  bilaterally. Temperature is within normal limits  bilaterally.  Neurologic  Senn-Weinstein monofilament wire test within normal limits  bilaterally. Muscle power within normal limits bilaterally.  Nails Thick disfigured discolored nails with subungual debris  from hallux to fifth toes bilaterally. No evidence of bacterial infection or drainage bilaterally.  Orthopedic  No limitations of motion  feet .  No crepitus or effusions noted.  No bony pathology or digital deformities noted.  Skin  normotropic skin with no porokeratosis noted bilaterally.  No signs of infections or ulcers noted.     Onychomycosis  Nails  B/L.  Pain in right toes  Pain in left toes  Debridement of nails both feet followed trimming the nails with dremel tool.    RTC 10 weeks    Cordella Bold DPM

## 2024-07-09 ENCOUNTER — Inpatient Hospital Stay

## 2024-07-09 ENCOUNTER — Inpatient Hospital Stay: Admitting: Hematology

## 2024-07-09 VITALS — BP 163/97 | HR 62 | Temp 97.4°F | Resp 18

## 2024-07-09 VITALS — BP 78/56 | HR 65 | Temp 97.0°F | Resp 20 | Wt 278.5 lb

## 2024-07-09 DIAGNOSIS — C9000 Multiple myeloma not having achieved remission: Secondary | ICD-10-CM

## 2024-07-09 DIAGNOSIS — E8581 Light chain (AL) amyloidosis: Secondary | ICD-10-CM

## 2024-07-09 DIAGNOSIS — Z5111 Encounter for antineoplastic chemotherapy: Secondary | ICD-10-CM | POA: Diagnosis not present

## 2024-07-09 DIAGNOSIS — Z5112 Encounter for antineoplastic immunotherapy: Secondary | ICD-10-CM | POA: Diagnosis not present

## 2024-07-09 DIAGNOSIS — I959 Hypotension, unspecified: Secondary | ICD-10-CM

## 2024-07-09 LAB — CBC WITH DIFFERENTIAL (CANCER CENTER ONLY)
Abs Immature Granulocytes: 0.02 K/uL (ref 0.00–0.07)
Basophils Absolute: 0 K/uL (ref 0.0–0.1)
Basophils Relative: 0 %
Eosinophils Absolute: 0.1 K/uL (ref 0.0–0.5)
Eosinophils Relative: 2 %
HCT: 38.1 % — ABNORMAL LOW (ref 39.0–52.0)
Hemoglobin: 12.8 g/dL — ABNORMAL LOW (ref 13.0–17.0)
Immature Granulocytes: 0 %
Lymphocytes Relative: 23 %
Lymphs Abs: 2.1 K/uL (ref 0.7–4.0)
MCH: 30.8 pg (ref 26.0–34.0)
MCHC: 33.6 g/dL (ref 30.0–36.0)
MCV: 91.8 fL (ref 80.0–100.0)
Monocytes Absolute: 0.7 K/uL (ref 0.1–1.0)
Monocytes Relative: 8 %
Neutro Abs: 6 K/uL (ref 1.7–7.7)
Neutrophils Relative %: 67 %
Platelet Count: 213 K/uL (ref 150–400)
RBC: 4.15 MIL/uL — ABNORMAL LOW (ref 4.22–5.81)
RDW: 15.5 % (ref 11.5–15.5)
WBC Count: 9 K/uL (ref 4.0–10.5)
nRBC: 0 % (ref 0.0–0.2)

## 2024-07-09 MED ORDER — DARATUMUMAB-HYALURONIDASE-FIHJ 1800-30000 MG-UT/15ML ~~LOC~~ SOLN
1800.0000 mg | Freq: Once | SUBCUTANEOUS | Status: AC
  Administered 2024-07-09: 1800 mg via SUBCUTANEOUS
  Filled 2024-07-09: qty 15

## 2024-07-09 MED ORDER — DIPHENHYDRAMINE HCL 25 MG PO CAPS
50.0000 mg | ORAL_CAPSULE | Freq: Once | ORAL | Status: AC
  Administered 2024-07-09: 50 mg via ORAL
  Filled 2024-07-09: qty 2

## 2024-07-09 MED ORDER — DEXAMETHASONE 6 MG PO TABS
6.0000 mg | ORAL_TABLET | Freq: Once | ORAL | Status: AC
  Administered 2024-07-09: 6 mg via ORAL
  Filled 2024-07-09: qty 1

## 2024-07-09 MED ORDER — ALBUMIN HUMAN 25 % IV SOLN
25.0000 g | Freq: Once | INTRAVENOUS | Status: AC
  Administered 2024-07-09: 25 g via INTRAVENOUS
  Filled 2024-07-09: qty 100

## 2024-07-09 MED ORDER — ACETAMINOPHEN 325 MG PO TABS
650.0000 mg | ORAL_TABLET | Freq: Once | ORAL | Status: AC
  Administered 2024-07-09: 650 mg via ORAL
  Filled 2024-07-09: qty 2

## 2024-07-09 MED ORDER — SODIUM CHLORIDE 0.9 % IV SOLN: INTRAVENOUS | Status: DC
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
## 2024-07-09 NOTE — Patient Instructions (Signed)
 [facility] - A DEPT OF [facility]  Discharge Instructions: Thank you for choosing [facility] to provide your oncology and hematology care.   If you have a lab appointment with the Cancer Center, please go directly to the Cancer Center and check in at the registration area.   Wear comfortable clothing and clothing appropriate for easy access to any Portacath or PICC line.   We strive to give you quality time with your provider. You may need to reschedule your appointment if you arrive late (15 or more minutes).  Arriving late affects you and other patients whose appointments are after yours.  Also, if you miss three or more appointments without notifying the office, you may be dismissed from the clinic at the providers discretion.      For prescription refill requests, have your pharmacy contact our office and allow 72 hours for refills to be completed.    Today you received the following chemotherapy and/or immunotherapy agents: Daratumumab -hyaluronidase -fihj (Darzalex  faspro)    To help prevent nausea and vomiting after your treatment, we encourage you to take your nausea medication as directed.  BELOW ARE SYMPTOMS THAT SHOULD BE REPORTED IMMEDIATELY: *FEVER GREATER THAN 100.4 F (38 C) OR HIGHER *CHILLS OR SWEATING *NAUSEA AND VOMITING THAT IS NOT CONTROLLED WITH YOUR NAUSEA MEDICATION *UNUSUAL SHORTNESS OF BREATH *UNUSUAL BRUISING OR BLEEDING *URINARY PROBLEMS (pain or burning when urinating, or frequent urination) *BOWEL PROBLEMS (unusual diarrhea, constipation, pain near the anus) TENDERNESS IN MOUTH AND THROAT WITH OR WITHOUT PRESENCE OF ULCERS (sore throat, sores in mouth, or a toothache) UNUSUAL RASH, SWELLING OR PAIN  UNUSUAL VAGINAL DISCHARGE OR ITCHING   Items with * indicate a potential emergency and should be followed up as soon as possible or go to the Emergency Department if any problems should occur.  Please show the  CHEMOTHERAPY ALERT CARD or IMMUNOTHERAPY ALERT CARD at check-in to the Emergency Department and triage nurse.  Should you have questions after your visit or need to cancel or reschedule your appointment, please contact [facility] - A DEPT OF [facility]  Dept: [phone]  and follow the prompts.  Office hours are 8:00 a.m. to 4:30 p.m. Monday - Friday. Please note that voicemails left after 4:00 p.m. may not be returned until the following business day.  We are closed weekends and major holidays. You have access to a nurse at all times for urgent questions. Please call the main number to the clinic Dept: [phone] and follow the prompts.   For any non-urgent questions, you may also contact your provider using MyChart. We now offer e-Visits for anyone 55 and older to request care online for non-urgent symptoms. For details visit [email or web address].   Also download the MyChart app! Go to the app store, search MyChart, open the app, select [facility], and log in with your MyChart username and password.  Albumin  Injection What is this medication? ALBUMIN  (al BYOO min) treats low blood volume, which may occur with severe dehydration or bleeding. It works by increasing blood volume so your heart can pump blood to the rest of your body. It may also be used to treat low albumin  levels caused by surgery, infection, or other health conditions. Albumin  is a protein that helps your body balance the level of fluid in your blood vessels. This helps maintain a healthy blood pressure and prevents swelling or edema. This medicine may be used  for other purposes; ask your health care provider or pharmacist if you have questions. COMMON BRAND NAME(S): Albuked , Albumarc, Albuminar, Albuminex , AlbuRx , Albutein , Buminate, Flexbumin , Kedbumin , Macrotec, Plasbumin , Plasbumin -20 What should I tell my care team before I take this medication? They need to know if you have any of  these conditions: Heart disease Kidney disease Low red blood cell levels (anemia) An unusual or allergic reaction to albumin , other medications, foods, dyes, or preservatives Pregnant or trying to get pregnant Breastfeeding How should I use this medication? This medication is infused into a vein. It is given by your care team in a hospital or clinic setting. Talk to your care team about the use of this medication in children. While it may be given to children for selected conditions, precautions do apply. Overdosage: If you think you have taken too much of this medicine contact a poison control center or emergency room at once. NOTE: This medicine is only for you. Do not share this medicine with others. What if I miss a dose? This does not apply. What may interact with this medication? Interactions are not expected. This list may not describe all possible interactions. Give your health care provider a list of all the medicines, herbs, non-prescription drugs, or dietary supplements you use. Also tell them if you smoke, drink alcohol, or use illegal drugs. Some items may interact with your medicine. What should I watch for while using this medication? Your condition will be monitored carefully while you are receiving this medication. This product is derived from human plasma. Talk to your care team about the risks and benefits of this medication. What side effects may I notice from receiving this medication? Side effects that you should report to your care team as soon as possible: Allergic reactions--skin rash, itching, hives, swelling of the face, lips, tongue, or throat Heart failure--shortness of breath, swelling of the ankles, feet, or hands, sudden weight gain, unusual weakness or fatigue Increase in blood pressure Shortness of breath or trouble breathing, cough, unusual weakness or fatigue, blue skin or lips Side effects that usually do not require medical attention (report these to  your care team if they continue or are bothersome): Flushing Headache Nausea This list may not describe all possible side effects. Call your doctor for medical advice about side effects. You may report side effects to FDA at 1-800-FDA-1088. Where should I keep my medication? This medication is given in a hospital or clinic. It will not be stored at home. NOTE: This sheet is a summary. It may not cover all possible information. If you have questions about this medicine, talk to your doctor, pharmacist, or health care provider.  2024 Elsevier/Gold Standard (2023-05-19 00:00:00)  Albumin  Injection  What is this medication? ALBUMIN  (al BYOO min) treats low blood volume, which may occur with severe dehydration or bleeding. It works by increasing blood volume so your heart can pump blood to the rest of your body. It may also be used to treat low albumin  levels caused by surgery, infection, or other health conditions. Albumin  is a protein that helps your body balance the level of fluid in your blood vessels. This helps maintain a healthy blood pressure and prevents swelling or edema. This medicine may be used for other purposes; ask your health care provider or pharmacist if you have questions. COMMON BRAND NAME(S): Albuked , Albumarc, Albuminar, Albuminex , AlbuRx , Albutein , Buminate, Flexbumin , Kedbumin , Macrotec, Plasbumin , Plasbumin -20 What should I tell my care team before I take this medication? They need to know  if you have any of these conditions: Heart disease Kidney disease Low red blood cell levels (anemia) An unusual or allergic reaction to albumin , other medications, foods, dyes, or preservatives Pregnant or trying to get pregnant Breastfeeding How should I use this medication? This medication is infused into a vein. It is given by your care team in a hospital or clinic setting. Talk to your care team about the use of this medication in children. While it may be given to children for  selected conditions, precautions do apply. Overdosage: If you think you have taken too much of this medicine contact a poison control center or emergency room at once. NOTE: This medicine is only for you. Do not share this medicine with others. What if I miss a dose? This does not apply. What may interact with this medication? Interactions are not expected. This list may not describe all possible interactions. Give your health care provider a list of all the medicines, herbs, non-prescription drugs, or dietary supplements you use. Also tell them if you smoke, drink alcohol, or use illegal drugs. Some items may interact with your medicine. What should I watch for while using this medication? Your condition will be monitored carefully while you are receiving this medication. This product is derived from human plasma. Talk to your care team about the risks and benefits of this medication. What side effects may I notice from receiving this medication? Side effects that you should report to your care team as soon as possible: Allergic reactions--skin rash, itching, hives, swelling of the face, lips, tongue, or throat Heart failure--shortness of breath, swelling of the ankles, feet, or hands, sudden weight gain, unusual weakness or fatigue Increase in blood pressure Shortness of breath or trouble breathing, cough, unusual weakness or fatigue, blue skin or lips Side effects that usually do not require medical attention (report these to your care team if they continue or are bothersome): Flushing Headache Nausea This list may not describe all possible side effects. Call your doctor for medical advice about side effects. You may report side effects to FDA at 1-800-FDA-1088. Where should I keep my medication? This medication is given in a hospital or clinic. It will not be stored at home. NOTE: This sheet is a summary. It may not cover all possible information. If you have questions about this  medicine, talk to your doctor, pharmacist, or health care provider.  2024 Elsevier/Gold Standard (2023-05-19 00:00:00)

## 2024-07-11 ENCOUNTER — Encounter: Payer: Self-pay | Admitting: Hematology

## 2024-07-11 ENCOUNTER — Other Ambulatory Visit: Payer: Self-pay

## 2024-07-11 LAB — MULTIPLE MYELOMA PANEL, SERUM
Albumin SerPl Elph-Mcnc: 1.4 g/dL — ABNORMAL LOW (ref 2.9–4.4)
Albumin/Glob SerPl: 0.7 (ref 0.7–1.7)
Alpha 1: 0.1 g/dL (ref 0.0–0.4)
Alpha2 Glob SerPl Elph-Mcnc: 1.3 g/dL — ABNORMAL HIGH (ref 0.4–1.0)
B-Globulin SerPl Elph-Mcnc: 0.7 g/dL (ref 0.7–1.3)
Gamma Glob SerPl Elph-Mcnc: 0.2 g/dL — ABNORMAL LOW (ref 0.4–1.8)
Globulin, Total: 2.3 g/dL (ref 2.2–3.9)
IgA: 55 mg/dL — ABNORMAL LOW (ref 61–437)
IgG (Immunoglobin G), Serum: 199 mg/dL — ABNORMAL LOW (ref 603–1613)
IgM (Immunoglobulin M), Srm: 70 mg/dL (ref 20–172)
Total Protein ELP: 3.7 g/dL — ABNORMAL LOW (ref 6.0–8.5)

## 2024-07-12 ENCOUNTER — Other Ambulatory Visit: Payer: Self-pay

## 2024-07-12 ENCOUNTER — Other Ambulatory Visit (HOSPITAL_COMMUNITY): Payer: Self-pay

## 2024-07-15 ENCOUNTER — Other Ambulatory Visit: Payer: Self-pay

## 2024-07-16 ENCOUNTER — Inpatient Hospital Stay

## 2024-07-16 ENCOUNTER — Other Ambulatory Visit: Payer: Self-pay

## 2024-07-16 ENCOUNTER — Encounter: Payer: Self-pay | Admitting: Hematology

## 2024-07-16 VITALS — BP 139/79 | HR 62 | Temp 97.6°F | Resp 18 | Ht 72.0 in | Wt 286.4 lb

## 2024-07-16 DIAGNOSIS — Z5112 Encounter for antineoplastic immunotherapy: Secondary | ICD-10-CM | POA: Diagnosis not present

## 2024-07-16 DIAGNOSIS — I959 Hypotension, unspecified: Secondary | ICD-10-CM

## 2024-07-16 MED ORDER — SODIUM CHLORIDE 0.9 % IV SOLN: INTRAVENOUS | Status: DC

## 2024-07-16 MED ORDER — ALBUMIN HUMAN 25 % IV SOLN
25.0000 g | Freq: Once | INTRAVENOUS | Status: AC
  Administered 2024-07-16: 25 g via INTRAVENOUS
  Filled 2024-07-16: qty 100

## 2024-07-16 NOTE — Patient Instructions (Signed)
 Albumin  Injection What is this medication? ALBUMIN  (al BYOO min) treats low blood volume, which may occur with severe dehydration or bleeding. It works by increasing blood volume so your heart can pump blood to the rest of your body. It may also be used to treat low albumin  levels caused by surgery, infection, or other health conditions. Albumin  is a protein that helps your body balance the level of fluid in your blood vessels. This helps maintain a healthy blood pressure and prevents swelling or edema. This medicine may be used for other purposes; ask your health care provider or pharmacist if you have questions. COMMON BRAND NAME(S): Albuked , Albumarc, Albuminar, Albuminex , AlbuRx , Albutein , Buminate, Flexbumin , Kedbumin , Macrotec, Plasbumin , Plasbumin -20 What should I tell my care team before I take this medication? They need to know if you have any of these conditions: Heart disease Kidney disease Low red blood cell levels (anemia) An unusual or allergic reaction to albumin , other medications, foods, dyes, or preservatives Pregnant or trying to get pregnant Breastfeeding How should I use this medication? This medication is infused into a vein. It is given by your care team in a hospital or clinic setting. Talk to your care team about the use of this medication in children. While it may be given to children for selected conditions, precautions do apply. Overdosage: If you think you have taken too much of this medicine contact a poison control center or emergency room at once. NOTE: This medicine is only for you. Do not share this medicine with others. What if I miss a dose? This does not apply. What may interact with this medication? Interactions are not expected. This list may not describe all possible interactions. Give your health care provider a list of all the medicines, herbs, non-prescription drugs, or dietary supplements you use. Also tell them if you smoke, drink alcohol, or use  illegal drugs. Some items may interact with your medicine. What should I watch for while using this medication? Your condition will be monitored carefully while you are receiving this medication. This product is derived from human plasma. Talk to your care team about the risks and benefits of this medication. What side effects may I notice from receiving this medication? Side effects that you should report to your care team as soon as possible: Allergic reactions--skin rash, itching, hives, swelling of the face, lips, tongue, or throat Heart failure--shortness of breath, swelling of the ankles, feet, or hands, sudden weight gain, unusual weakness or fatigue Increase in blood pressure Shortness of breath or trouble breathing, cough, unusual weakness or fatigue, blue skin or lips Side effects that usually do not require medical attention (report these to your care team if they continue or are bothersome): Flushing Headache Nausea This list may not describe all possible side effects. Call your doctor for medical advice about side effects. You may report side effects to FDA at 1-800-FDA-1088. Where should I keep my medication? This medication is given in a hospital or clinic. It will not be stored at home. NOTE: This sheet is a summary. It may not cover all possible information. If you have questions about this medicine, talk to your doctor, pharmacist, or health care provider.  2024 Elsevier/Gold Standard (2023-05-19 00:00:00)

## 2024-07-17 ENCOUNTER — Other Ambulatory Visit: Payer: Self-pay

## 2024-07-17 ENCOUNTER — Encounter: Payer: Self-pay | Admitting: *Deleted

## 2024-07-17 ENCOUNTER — Telehealth (HOSPITAL_COMMUNITY): Payer: Self-pay

## 2024-07-17 ENCOUNTER — Telehealth: Payer: Self-pay | Admitting: Hematology

## 2024-07-17 NOTE — Telephone Encounter (Signed)
 Advanced Heart Failure Patient Advocate Encounter  Received notification from pharmacy team that droxidopa  has a copay of [phone] for 30 day supply. Patient currently has an unmet deductible for 2026.  Spoke to patients spouse regarding MPPP and GoodRx (current estimated price $79.07 [place] Prior, [place]).  Patients spouse confirmed they do have one month of medication in stock, they will reach out to pharmacy once MPPP is active, or reach out to prescriber to have rx sent to alternate pharmacy for Good Rx.  Rachel DEL, CPhT Rx Patient Advocate Phone: [phone]

## 2024-07-17 NOTE — Progress Notes (Signed)
 [name].                                          MRN: [id number]   07/17/2024   The [facility] Quality Team Specialist reviewed this patient medical record for the purposes of chart review for care gap closure. The following were reviewed: chart review for care gap closure-glycemic status assessment.    [facility]I Quality Team

## 2024-07-24 ENCOUNTER — Ambulatory Visit: Admitting: Podiatry

## 2024-07-24 ENCOUNTER — Inpatient Hospital Stay: Attending: Internal Medicine

## 2024-07-24 VITALS — BP 114/67 | HR 63 | Temp 98.1°F | Resp 18 | Wt 283.2 lb

## 2024-07-24 DIAGNOSIS — I959 Hypotension, unspecified: Secondary | ICD-10-CM

## 2024-07-24 MED ORDER — ALBUMIN HUMAN 25 % IV SOLN
25.0000 g | Freq: Once | INTRAVENOUS | Status: AC
  Administered 2024-07-24: 25 g via INTRAVENOUS
  Filled 2024-07-24: qty 100

## 2024-07-24 NOTE — Patient Instructions (Signed)
 Albumin  Injection What is this medication? ALBUMIN  (al BYOO min) treats low blood volume, which may occur with severe dehydration or bleeding. It works by increasing blood volume so your heart can pump blood to the rest of your body. It may also be used to treat low albumin  levels caused by surgery, infection, or other health conditions. Albumin  is a protein that helps your body balance the level of fluid in your blood vessels. This helps maintain a healthy blood pressure and prevents swelling or edema. This medicine may be used for other purposes; ask your health care provider or pharmacist if you have questions. COMMON BRAND NAME(S): Albuked , Albumarc, Albuminar, Albuminex , AlbuRx , Albutein , Buminate, Flexbumin , Kedbumin , Macrotec, Plasbumin , Plasbumin -20 What should I tell my care team before I take this medication? They need to know if you have any of these conditions: Heart disease Kidney disease Low red blood cell levels (anemia) An unusual or allergic reaction to albumin , other medications, foods, dyes, or preservatives Pregnant or trying to get pregnant Breastfeeding How should I use this medication? This medication is infused into a vein. It is given by your care team in a hospital or clinic setting. Talk to your care team about the use of this medication in children. While it may be given to children for selected conditions, precautions do apply. Overdosage: If you think you have taken too much of this medicine contact a poison control center or emergency room at once. NOTE: This medicine is only for you. Do not share this medicine with others. What if I miss a dose? This does not apply. What may interact with this medication? Interactions are not expected. This list may not describe all possible interactions. Give your health care provider a list of all the medicines, herbs, non-prescription drugs, or dietary supplements you use. Also tell them if you smoke, drink alcohol, or use  illegal drugs. Some items may interact with your medicine. What should I watch for while using this medication? Your condition will be monitored carefully while you are receiving this medication. This product is derived from human plasma. Talk to your care team about the risks and benefits of this medication. What side effects may I notice from receiving this medication? Side effects that you should report to your care team as soon as possible: Allergic reactions--skin rash, itching, hives, swelling of the face, lips, tongue, or throat Heart failure--shortness of breath, swelling of the ankles, feet, or hands, sudden weight gain, unusual weakness or fatigue Increase in blood pressure Shortness of breath or trouble breathing, cough, unusual weakness or fatigue, blue skin or lips Side effects that usually do not require medical attention (report these to your care team if they continue or are bothersome): Flushing Headache Nausea This list may not describe all possible side effects. Call your doctor for medical advice about side effects. You may report side effects to FDA at 1-800-FDA-1088. Where should I keep my medication? This medication is given in a hospital or clinic. It will not be stored at home. NOTE: This sheet is a summary. It may not cover all possible information. If you have questions about this medicine, talk to your doctor, pharmacist, or health care provider.  2024 Elsevier/Gold Standard (2023-05-19 00:00:00)

## 2024-07-31 ENCOUNTER — Inpatient Hospital Stay

## 2024-08-06 ENCOUNTER — Inpatient Hospital Stay

## 2024-08-06 ENCOUNTER — Inpatient Hospital Stay: Admitting: Hematology

## 2024-08-07 ENCOUNTER — Inpatient Hospital Stay: Payer: Self-pay | Admitting: Hematology

## 2024-08-07 ENCOUNTER — Inpatient Hospital Stay: Payer: Self-pay

## 2024-08-12 ENCOUNTER — Inpatient Hospital Stay

## 2024-08-21 ENCOUNTER — Inpatient Hospital Stay

## 2024-08-22 ENCOUNTER — Inpatient Hospital Stay: Attending: Internal Medicine

## 2024-08-28 ENCOUNTER — Inpatient Hospital Stay

## 2024-09-04 ENCOUNTER — Inpatient Hospital Stay

## 2024-09-04 ENCOUNTER — Inpatient Hospital Stay: Admitting: Hematology

## 2024-09-11 ENCOUNTER — Ambulatory Visit: Admitting: Podiatry

## 2024-09-12 ENCOUNTER — Inpatient Hospital Stay

## 2024-09-18 ENCOUNTER — Ambulatory Visit: Admitting: Urology

## 2025-01-27 ENCOUNTER — Ambulatory Visit: Admitting: Adult Health
